# Patient Record
Sex: Female | Born: 1935 | Race: Black or African American | Hispanic: No | Smoking: Never smoker
Health system: Southern US, Community
[De-identification: ages and names within clinical notes are randomized; demographics above are authoritative.]

## PROBLEM LIST (undated history)

## (undated) DIAGNOSIS — I82409 Acute embolism and thrombosis of unspecified deep veins of unspecified lower extremity: Secondary | ICD-10-CM

## (undated) DIAGNOSIS — N289 Disorder of kidney and ureter, unspecified: Secondary | ICD-10-CM

## (undated) DIAGNOSIS — J189 Pneumonia, unspecified organism: Secondary | ICD-10-CM

## (undated) DIAGNOSIS — E785 Hyperlipidemia, unspecified: Secondary | ICD-10-CM

## (undated) DIAGNOSIS — I10 Essential (primary) hypertension: Secondary | ICD-10-CM

## (undated) DIAGNOSIS — M1990 Unspecified osteoarthritis, unspecified site: Secondary | ICD-10-CM

## (undated) DIAGNOSIS — S065X9A Traumatic subdural hemorrhage with loss of consciousness of unspecified duration, initial encounter: Secondary | ICD-10-CM

## (undated) DIAGNOSIS — R32 Unspecified urinary incontinence: Secondary | ICD-10-CM

## (undated) DIAGNOSIS — S065XAA Traumatic subdural hemorrhage with loss of consciousness status unknown, initial encounter: Secondary | ICD-10-CM

## (undated) HISTORY — PX: INSERTION OF VENA CAVA FILTER: SHX5871

## (undated) HISTORY — PX: BRAIN SURGERY: SHX531

---

## 1997-11-26 ENCOUNTER — Other Ambulatory Visit: Admission: RE | Admit: 1997-11-26 | Discharge: 1997-11-26 | Payer: Self-pay | Admitting: Family Medicine

## 1998-03-19 ENCOUNTER — Ambulatory Visit (HOSPITAL_COMMUNITY): Admission: RE | Admit: 1998-03-19 | Discharge: 1998-03-19 | Payer: Self-pay | Admitting: *Deleted

## 1998-04-22 ENCOUNTER — Other Ambulatory Visit: Admission: RE | Admit: 1998-04-22 | Discharge: 1998-04-22 | Payer: Self-pay | Admitting: Obstetrics and Gynecology

## 1999-03-10 ENCOUNTER — Ambulatory Visit (HOSPITAL_COMMUNITY): Admission: RE | Admit: 1999-03-10 | Discharge: 1999-03-10 | Payer: Self-pay | Admitting: *Deleted

## 1999-05-11 ENCOUNTER — Emergency Department (HOSPITAL_COMMUNITY): Admission: EM | Admit: 1999-05-11 | Discharge: 1999-05-11 | Payer: Self-pay | Admitting: Emergency Medicine

## 1999-11-16 ENCOUNTER — Ambulatory Visit (HOSPITAL_COMMUNITY): Admission: RE | Admit: 1999-11-16 | Discharge: 1999-11-16 | Payer: Self-pay | Admitting: *Deleted

## 2001-08-08 ENCOUNTER — Emergency Department (HOSPITAL_COMMUNITY): Admission: EM | Admit: 2001-08-08 | Discharge: 2001-08-08 | Payer: Self-pay | Admitting: Emergency Medicine

## 2004-10-30 ENCOUNTER — Emergency Department (HOSPITAL_COMMUNITY): Admission: EM | Admit: 2004-10-30 | Discharge: 2004-10-30 | Payer: Self-pay | Admitting: Emergency Medicine

## 2011-12-28 ENCOUNTER — Emergency Department (HOSPITAL_COMMUNITY): Payer: Medicare Other

## 2011-12-28 ENCOUNTER — Emergency Department (HOSPITAL_COMMUNITY)
Admission: EM | Admit: 2011-12-28 | Discharge: 2011-12-28 | Disposition: A | Discharge status: Home / Self Care | Payer: Medicare Other | Attending: Emergency Medicine | Admitting: Emergency Medicine

## 2011-12-28 ENCOUNTER — Encounter (HOSPITAL_COMMUNITY): Payer: Self-pay | Admitting: *Deleted

## 2011-12-28 DIAGNOSIS — I10 Essential (primary) hypertension: Secondary | ICD-10-CM | POA: Insufficient documentation

## 2011-12-28 DIAGNOSIS — M25569 Pain in unspecified knee: Secondary | ICD-10-CM | POA: Insufficient documentation

## 2011-12-28 DIAGNOSIS — M1990 Unspecified osteoarthritis, unspecified site: Secondary | ICD-10-CM

## 2011-12-28 DIAGNOSIS — E119 Type 2 diabetes mellitus without complications: Secondary | ICD-10-CM | POA: Insufficient documentation

## 2011-12-28 HISTORY — DX: Essential (primary) hypertension: I10

## 2011-12-28 MED ORDER — OXYCODONE-ACETAMINOPHEN 5-325 MG PO TABS: 2.0000 | ORAL_TABLET | ORAL | Status: AC | PRN

## 2011-12-28 NOTE — ED Provider Notes (Signed)
History   This chart was scribed for Cheri Guppy, MD by Charolett Bumpers . The patient was seen in room TR04C/TR04C. Patient's care was started at 1103.    CSN: [id number]  Arrival date & time 12/28/11  1003   First MD Initiated Contact with Patient 12/28/11 1103      Chief Complaint  Patient presents with  . Leg Pain    (Consider location/radiation/quality/duration/timing/severity/associated sxs/prior treatment) HPI April [name] is a 78 y.o. female who presents to the Emergency Department complaining of constant, moderate right knee pain with associated swelling for the past 2 days. Pt reports that the pain is aggravated with ambulation. Pt denies any recent falls or known injuries. Pt denies any h/o injuries to right knee. Pt denies any fevers or chills. Pt reports a h/o HTN and Diabetes. Pt denies any h/o kidney problems or CHF.   Past Medical History  Diagnosis Date  . Diabetes mellitus   . Hypertension     History reviewed. No pertinent past surgical history.  History reviewed. No pertinent family history.  History  Substance Use Topics  . Smoking status: Never Smoker   . Smokeless tobacco: Not on file  . Alcohol Use: No    OB History    Grav Para Term Preterm Abortions TAB SAB Ect Mult Living                  Review of Systems  Constitutional: Negative for fever and chills.  Respiratory: Negative for shortness of breath.   Gastrointestinal: Negative for nausea and vomiting.  Musculoskeletal: Positive for joint swelling and arthralgias.  Neurological: Negative for weakness.    Allergies  Review of patient's allergies indicates no known allergies.  Home Medications   Current Outpatient Rx  Name Route Sig Dispense Refill  . ASPIRIN EC 81 MG PO TBEC Oral Take 81 mg by mouth daily.    . ATORVASTATIN CALCIUM 40 MG PO TABS Oral Take 40 mg by mouth daily. On Mondays    . CARVEDILOL 25 MG PO TABS Oral Take 25 mg by mouth 2 (two) times daily with a  meal.    . CLONIDINE HCL 0.2 MG/24HR TD PTWK Transdermal Place 1 patch onto the skin once a week.    Marland Kitchen HYDROCHLOROTHIAZIDE 25 MG PO TABS Oral Take 50 mg by mouth daily.    Marland Kitchen SITAGLIPTIN PHOSPHATE 50 MG PO TABS Oral Take 50 mg by mouth daily.    . TELMISARTAN 80 MG PO TABS Oral Take 80 mg by mouth daily.      BP 144/58  Pulse 72  Temp 98.5 F (36.9 C) (Oral)  Resp 16  SpO2 97%  Physical Exam  Nursing note and vitals reviewed. Constitutional: She is oriented to person, place, and time. She appears well-developed and well-nourished. No distress.  HENT:  Head: Normocephalic and atraumatic.  Eyes: EOM are normal.  Neck: Normal range of motion. Neck supple. No tracheal deviation present.  Cardiovascular: Normal rate.   Pulmonary/Chest: Effort normal. No respiratory distress.  Musculoskeletal: Normal range of motion. She exhibits tenderness. She exhibits no edema.       No swelling to right knee. Full ROM of right knee. Minimal tenderness to medial aspect of right knee.   Neurological: She is alert and oriented to person, place, and time.  Skin: Skin is warm and dry.  Psychiatric: She has a normal mood and affect. Her behavior is normal.    ED Course  Procedures (including critical care time)  DIAGNOSTIC STUDIES: Oxygen Saturation is 97% on room air, normal by my interpretation.    COORDINATION OF CARE:  11:09-Discussed planned course of treatment with the patient including a x-ray of right knee, who is agreeable at this time.     Labs Reviewed - No data to display No results found.   No diagnosis found.    MDM  Nontraumatic right knee pain, in elderly, female.  No systemic symptoms.  No physical examination findings of edema, infection, or joint instability.  X-ray, consistent with osteoarthritis.      I personally performed the services described in this documentation, which was scribed in my presence. The recorded information has been reviewed and  considered.    a  Cheri Guppy, MD 12/28/11 1140

## 2011-12-28 NOTE — ED Notes (Signed)
Pt d/c home in NAD. Pt voiced understanding of d/c instructions and follow up care. Pt instructed not to drive or operate heavy machinery after taking percocet.

## 2011-12-28 NOTE — ED Notes (Signed)
To ED for eval of left leg pain for past week. Min-mod swelling from left knee up to thigh. Denies injury. Pain increases with ambulation. Denies SOB

## 2012-06-13 ENCOUNTER — Emergency Department (INDEPENDENT_AMBULATORY_CARE_PROVIDER_SITE_OTHER)
Admission: EM | Admit: 2012-06-13 | Discharge: 2012-06-13 | Disposition: A | Discharge status: Home / Self Care | Payer: Medicare Other | Source: Home / Self Care | Attending: Family Medicine | Admitting: Family Medicine

## 2012-06-13 ENCOUNTER — Encounter (HOSPITAL_COMMUNITY): Payer: Self-pay | Admitting: Emergency Medicine

## 2012-06-13 DIAGNOSIS — E119 Type 2 diabetes mellitus without complications: Secondary | ICD-10-CM

## 2012-06-13 DIAGNOSIS — I10 Essential (primary) hypertension: Secondary | ICD-10-CM

## 2012-06-13 LAB — POCT URINALYSIS DIP (DEVICE)
Bilirubin Urine: NEGATIVE
Glucose, UA: 500 mg/dL — AB
Leukocytes, UA: NEGATIVE
Nitrite: NEGATIVE
Urobilinogen, UA: 0.2 mg/dL (ref 0.0–1.0)
pH: 6 (ref 5.0–8.0)

## 2012-06-13 MED ORDER — SITAGLIPTIN PHOSPHATE 50 MG PO TABS: 50.0000 mg | ORAL_TABLET | Freq: Every day | ORAL | Status: DC

## 2012-06-13 MED ORDER — AMLODIPINE BESYLATE 10 MG PO TABS: 10.0000 mg | ORAL_TABLET | Freq: Every day | ORAL | Status: DC

## 2012-06-13 MED ORDER — HYDROCHLOROTHIAZIDE 25 MG PO TABS: 25.0000 mg | ORAL_TABLET | Freq: Every day | ORAL | Status: DC

## 2012-06-13 NOTE — ED Notes (Signed)
Pt c/o abdominal/stomach pain and having indigestion/acid reflux. Pt is out of three of her medications. Amlotipine,hctz,januvia.   Pt's son states that her blood sugar has been in the three hundreds for the past several days.

## 2012-06-13 NOTE — ED Notes (Signed)
Waiting discharge papers 

## 2012-06-13 NOTE — ED Provider Notes (Signed)
History     CSN: [id number]  Arrival date & time 06/13/12  1525   First MD Initiated Contact with Patient 06/13/12 1637      Chief Complaint  Patient presents with  . Abdominal Pain    pt states abdominal/acid reflux/indigetion. blood sugar high x 3 days. out meds.     (Consider location/radiation/quality/duration/timing/severity/associated sxs/prior treatment) Patient is a 79 y.o. female presenting with diabetes problem. The history is provided by the patient and a relative.  Diabetes She has type 2 diabetes mellitus. Hypoglycemia symptoms include headaches. Pertinent negatives for hypoglycemia include no confusion. Associated symptoms include polydipsia. Symptoms are stable.    Past Medical History  Diagnosis Date  . Diabetes mellitus   . Hypertension     History reviewed. No pertinent past surgical history.  History reviewed. No pertinent family history.  History  Substance Use Topics  . Smoking status: Never Smoker   . Smokeless tobacco: Not on file  . Alcohol Use: No    OB History    Grav Para Term Preterm Abortions TAB SAB Ect Mult Living                  Review of Systems  Constitutional: Negative.   Gastrointestinal: Positive for nausea. Negative for abdominal pain.  Genitourinary: Negative.   Skin: Negative.   Neurological: Positive for headaches.  Hematological: Positive for polydipsia.  Psychiatric/Behavioral: Negative for confusion.    Allergies  Review of patient's allergies indicates no known allergies.  Home Medications   Current Outpatient Rx  Name  Route  Sig  Dispense  Refill  . CARVEDILOL 25 MG PO TABS   Oral   Take 25 mg by mouth 2 (two) times daily with a meal.         . TELMISARTAN 80 MG PO TABS   Oral   Take 80 mg by mouth daily.         Marland Kitchen AMLODIPINE BESYLATE 10 MG PO TABS   Oral   Take 1 tablet (10 mg total) by mouth daily.   30 tablet   1   . AMLODIPINE BESYLATE 10 MG PO TABS   Oral   Take 10 mg by mouth daily.         . ASPIRIN EC 81 MG PO TBEC   Oral   Take 81 mg by mouth daily.         . ATORVASTATIN CALCIUM 40 MG PO TABS   Oral   Take 40 mg by mouth daily. On Mondays         . CLONIDINE HCL 0.2 MG/24HR TD PTWK   Transdermal   Place 1 patch onto the skin once a week.         Marland Kitchen HYDROCHLOROTHIAZIDE 25 MG PO TABS   Oral   Take 50 mg by mouth daily.         Marland Kitchen HYDROCHLOROTHIAZIDE 25 MG PO TABS   Oral   Take 1 tablet (25 mg total) by mouth daily.   30 tablet   1   . SITAGLIPTIN PHOSPHATE 50 MG PO TABS   Oral   Take 50 mg by mouth daily.         Marland Kitchen SITAGLIPTIN PHOSPHATE 50 MG PO TABS   Oral   Take 1 tablet (50 mg total) by mouth daily.   30 tablet   1     BP 117/86  Pulse 80  Temp 98.3 F (36.8 C) (Oral)  Resp 18  SpO2 96%  Physical Exam  Nursing note and vitals reviewed. Constitutional: She is oriented to person, place, and time. She appears well-developed and well-nourished.  HENT:  Head: Normocephalic.  Mouth/Throat: Oropharynx is clear and moist.  Eyes: Pupils are equal, round, and reactive to light.  Neck: Normal range of motion. Neck supple.  Cardiovascular: Normal rate and normal heart sounds.   Pulmonary/Chest: Breath sounds normal.  Abdominal: Soft. Bowel sounds are normal. She exhibits no distension. There is no tenderness.  Lymphadenopathy:    She has no cervical adenopathy.  Neurological: She is alert and oriented to person, place, and time.  Skin: Skin is warm and dry.    ED Course  Procedures (including critical care time)  Labs Reviewed  POCT URINALYSIS DIP (DEVICE) - Abnormal; Notable for the following:    Glucose, UA 500 (*)     Ketones, ur TRACE (*)     Hgb urine dipstick MODERATE (*)     Protein, ur >=300 (*)     All other components within normal limits   No results found.   1. Hypertension   2. Diabetes mellitus       MDM          Linna Hoff, MD 06/13/12 1745

## 2012-06-19 ENCOUNTER — Encounter (HOSPITAL_COMMUNITY): Payer: Self-pay

## 2012-06-19 ENCOUNTER — Emergency Department (INDEPENDENT_AMBULATORY_CARE_PROVIDER_SITE_OTHER)
Admission: EM | Admit: 2012-06-19 | Discharge: 2012-06-19 | Disposition: A | Discharge status: Home / Self Care | Payer: Medicare Other | Source: Home / Self Care

## 2012-06-19 DIAGNOSIS — E119 Type 2 diabetes mellitus without complications: Secondary | ICD-10-CM

## 2012-06-19 DIAGNOSIS — E785 Hyperlipidemia, unspecified: Secondary | ICD-10-CM

## 2012-06-19 DIAGNOSIS — I10 Essential (primary) hypertension: Secondary | ICD-10-CM

## 2012-06-19 LAB — COMPREHENSIVE METABOLIC PANEL
ALT: 11 U/L (ref 0–35)
AST: 16 U/L (ref 0–37)
Albumin: 2.7 g/dL — ABNORMAL LOW (ref 3.5–5.2)
Alkaline Phosphatase: 117 U/L (ref 39–117)
Calcium: 9.1 mg/dL (ref 8.4–10.5)
Glucose, Bld: 574 mg/dL (ref 70–99)
Potassium: 3.3 mEq/L — ABNORMAL LOW (ref 3.5–5.1)
Sodium: 136 mEq/L (ref 135–145)
Total Protein: 6.7 g/dL (ref 6.0–8.3)

## 2012-06-19 LAB — CBC
Hemoglobin: 11 g/dL — ABNORMAL LOW (ref 12.0–15.0)
MCH: 26.3 pg (ref 26.0–34.0)
MCHC: 32.8 g/dL (ref 30.0–36.0)
Platelets: 230 10*3/uL (ref 150–400)
RBC: 4.19 MIL/uL (ref 3.87–5.11)

## 2012-06-19 LAB — LIPID PANEL
HDL: 45 mg/dL (ref >39)
LDL Cholesterol: 108 mg/dL — ABNORMAL HIGH (ref 0–99)
Total CHOL/HDL Ratio: 4.4 RATIO
VLDL: 45 mg/dL — ABNORMAL HIGH (ref 0–40)

## 2012-06-19 LAB — HEMOGLOBIN A1C
Hgb A1c MFr Bld: 17 % — ABNORMAL HIGH (ref <5.7)
Mean Plasma Glucose: 441 mg/dL — ABNORMAL HIGH (ref <117)

## 2012-06-19 MED ORDER — INSULIN PEN NEEDLE 31G X 8 MM MISC: Status: DC

## 2012-06-19 MED ORDER — ATORVASTATIN CALCIUM 40 MG PO TABS: 40.0000 mg | ORAL_TABLET | Freq: Every day | ORAL | Status: DC

## 2012-06-19 MED ORDER — INSULIN GLARGINE 100 UNIT/ML ~~LOC~~ SOLN: SUBCUTANEOUS | Status: DC

## 2012-06-19 MED ORDER — INSULIN ASPART 100 UNIT/ML ~~LOC~~ SOLN
10.0000 [IU] | Freq: Once | SUBCUTANEOUS | Status: AC
  Administered 2012-06-19: 10 [IU] via SUBCUTANEOUS

## 2012-06-19 MED ORDER — TELMISARTAN 80 MG PO TABS: 80.0000 mg | ORAL_TABLET | Freq: Every day | ORAL | Status: DC

## 2012-06-19 MED ORDER — CLONIDINE HCL 0.2 MG/24HR TD PTWK: 1.0000 | MEDICATED_PATCH | TRANSDERMAL | Status: DC

## 2012-06-19 MED ORDER — CARVEDILOL 25 MG PO TABS: 25.0000 mg | ORAL_TABLET | Freq: Two times a day (BID) | ORAL | Status: DC

## 2012-06-19 NOTE — ED Notes (Signed)
Patient has history of hypertension and DM- needs medication refill

## 2012-06-19 NOTE — ED Provider Notes (Signed)
History    CSN: [id number]  Arrival date & time 06/19/12  1126  Chief Complaint  Patient presents with  . Medication Refill   HPI Pt reports that she is still looking for a primary care physician.  Pt needs some refills on some of her medications.  Pt is reporting that she has relocated from [place].   No complaints today.  No old records available.  BS are 300s at home per son.  Pt is taking all meds as prescribed.  The patient's son has been very concerned because of her high blood sugars.  Apparently she had been taking 7030 insulin twice daily for quite some time but apparently the insulin was stopped by a family member before the patient relocated to this area.  For the last 2 weeks he has noticed high blood sugar readings.  The patient eats well.  Apparently, the patient was having hypoglycemia events with 7030 insulin.  The patient says she was taking between 15-30 units twice daily of 70/30 insulin.  The patient's son and primary caretaker reports that he is checking her blood sugars at least 2 times per day.  She's had no hypoglycemia but she has had significant hyperglycemia.  She also has had urinary accidents.   Past Medical History  Diagnosis Date  . Diabetes mellitus   . Hypertension     History reviewed. No pertinent past surgical history.  No family history on file.  History  Substance Use Topics  . Smoking status: Never Smoker   . Smokeless tobacco: Not on file  . Alcohol Use: No    OB History    Grav Para Term Preterm Abortions TAB SAB Ect Mult Living                 Review of Systems  Constitutional: Negative.   HENT: Negative.   Cardiovascular: Negative.   Gastrointestinal:       Constipation  Genitourinary: Positive for urgency and enuresis.  All other systems reviewed and are negative.   Allergies  Review of patient's allergies indicates no known allergies.  Home Medications   Current Outpatient Rx  Name  Route  Sig  Dispense  Refill  .  AMLODIPINE BESYLATE 10 MG PO TABS   Oral   Take 1 tablet (10 mg total) by mouth daily.   30 tablet   1   . AMLODIPINE BESYLATE 10 MG PO TABS   Oral   Take 10 mg by mouth daily.         . ASPIRIN EC 81 MG PO TBEC   Oral   Take 81 mg by mouth daily.         . ATORVASTATIN CALCIUM 40 MG PO TABS   Oral   Take 40 mg by mouth daily. On Mondays         . CARVEDILOL 25 MG PO TABS   Oral   Take 25 mg by mouth 2 (two) times daily with a meal.         . CLONIDINE HCL 0.2 MG/24HR TD PTWK   Transdermal   Place 1 patch onto the skin once a week.         Marland Kitchen HYDROCHLOROTHIAZIDE 25 MG PO TABS   Oral   Take 50 mg by mouth daily.         Marland Kitchen HYDROCHLOROTHIAZIDE 25 MG PO TABS   Oral   Take 1 tablet (25 mg total) by mouth daily.   30 tablet   1   .  SITAGLIPTIN PHOSPHATE 50 MG PO TABS   Oral   Take 50 mg by mouth daily.         Marland Kitchen SITAGLIPTIN PHOSPHATE 50 MG PO TABS   Oral   Take 1 tablet (50 mg total) by mouth daily.   30 tablet   1   . TELMISARTAN 80 MG PO TABS   Oral   Take 80 mg by mouth daily.           BP 155/74  Pulse 75  Temp 97.4 F (36.3 C) (Oral)  Resp 19  SpO2 99%  Physical Exam  Nursing note and vitals reviewed. Constitutional: She is oriented to person, place, and time. She appears well-developed and well-nourished. No distress.  HENT:  Head: Normocephalic and atraumatic.  Eyes: EOM are normal. Pupils are equal, round, and reactive to light.  Neck: Normal range of motion. Neck supple.  Cardiovascular: Normal rate, regular rhythm and normal heart sounds.   Pulmonary/Chest: Effort normal and breath sounds normal.  Abdominal: Soft. Bowel sounds are normal. She exhibits no distension and no mass. There is no tenderness. There is no rebound and no guarding.       Feel stool in abdomen   Musculoskeletal: Normal range of motion. She exhibits no edema and no tenderness.  Neurological: She is alert and oriented to person, place, and time. She has  normal reflexes. No cranial nerve deficit.  Skin: Skin is warm and dry. No rash noted. No erythema. No pallor.  Psychiatric: She has a normal mood and affect. Her behavior is normal. Judgment and thought content normal.    ED Course  Procedures (including critical care time)  Labs Reviewed - No data to display No results found.  No diagnosis found.  BS - 535  MDM  IMPRESSION  HTN, difficult to control  Type 2 Diabetes Mellitus, difficult to control, currently uncontrolled, insulin requiring  Constipation  Suspected CRF  NO MEDICAL RECORDS AVAILABLE  Hyperglycemia and glucose toxicity  RECOMMENDATIONS / PLAN The patient was given 10 units of NovoLog in the office today. Starting Lantus Solostar Pen taking 15 units QHS with instructions to call  In 1 week with BS readings and will  Titrate to safely improve BS readings.  Continue sitagliptin daily.  Suspect renal insufficiency.  Checking labs today.  Will follow results.   The patient is working to establish primary care with a private physician.  Until she gets established with that physician she is welcome to continue to come to this clinic.  I would like to see her back in 2 weeks for further evaluation. The patient's son and primary caretaker reports that he is very familiar with the use of insulin pins and pen needles.  He has used them in the past.  I gave him instructions on how to begin tonight with the first dose of Lantus insulin.  He will begin with 15 units as mentioned above. Further instructions on diabetes care PENDING RESULTS OF LAB TESTS. Recommended over-the-counter stool softener I did refill the patient's other chronic home medications  FOLLOW UP 2 weeks for recheck.    The patient was given clear instructions to go to ER or return to medical center if symptoms don't improve, worsen or new problems develop.  The patient verbalized understanding.  The patient was told to call to get lab results if they  haven't heard anything in the next week.           Cleora Fleet, MD 06/19/12  1415 

## 2012-06-20 ENCOUNTER — Inpatient Hospital Stay (HOSPITAL_COMMUNITY): Payer: Medicare Other

## 2012-06-20 ENCOUNTER — Encounter (HOSPITAL_COMMUNITY): Payer: Self-pay | Admitting: *Deleted

## 2012-06-20 ENCOUNTER — Emergency Department (HOSPITAL_COMMUNITY): Payer: Medicare Other

## 2012-06-20 ENCOUNTER — Inpatient Hospital Stay (HOSPITAL_COMMUNITY)
Admission: EM | Admit: 2012-06-20 | Discharge: 2012-06-24 | DRG: 193 | Disposition: A | Discharge status: Home / Self Care | Payer: Medicare Other | Attending: Internal Medicine | Admitting: Internal Medicine

## 2012-06-20 DIAGNOSIS — R410 Disorientation, unspecified: Secondary | ICD-10-CM

## 2012-06-20 DIAGNOSIS — Z7982 Long term (current) use of aspirin: Secondary | ICD-10-CM

## 2012-06-20 DIAGNOSIS — I6200 Nontraumatic subdural hemorrhage, unspecified: Secondary | ICD-10-CM | POA: Diagnosis present

## 2012-06-20 DIAGNOSIS — Z23 Encounter for immunization: Secondary | ICD-10-CM

## 2012-06-20 DIAGNOSIS — G929 Unspecified toxic encephalopathy: Secondary | ICD-10-CM | POA: Diagnosis present

## 2012-06-20 DIAGNOSIS — Z794 Long term (current) use of insulin: Secondary | ICD-10-CM

## 2012-06-20 DIAGNOSIS — Z91199 Patient's noncompliance with other medical treatment and regimen due to unspecified reason: Secondary | ICD-10-CM

## 2012-06-20 DIAGNOSIS — Z9119 Patient's noncompliance with other medical treatment and regimen: Secondary | ICD-10-CM

## 2012-06-20 DIAGNOSIS — I10 Essential (primary) hypertension: Secondary | ICD-10-CM

## 2012-06-20 DIAGNOSIS — IMO0001 Reserved for inherently not codable concepts without codable children: Secondary | ICD-10-CM | POA: Diagnosis present

## 2012-06-20 DIAGNOSIS — E860 Dehydration: Secondary | ICD-10-CM | POA: Diagnosis present

## 2012-06-20 DIAGNOSIS — N189 Chronic kidney disease, unspecified: Secondary | ICD-10-CM | POA: Diagnosis present

## 2012-06-20 DIAGNOSIS — G92 Toxic encephalopathy: Secondary | ICD-10-CM | POA: Diagnosis present

## 2012-06-20 DIAGNOSIS — D638 Anemia in other chronic diseases classified elsewhere: Secondary | ICD-10-CM | POA: Diagnosis present

## 2012-06-20 DIAGNOSIS — S065X9A Traumatic subdural hemorrhage with loss of consciousness of unspecified duration, initial encounter: Secondary | ICD-10-CM

## 2012-06-20 DIAGNOSIS — I129 Hypertensive chronic kidney disease with stage 1 through stage 4 chronic kidney disease, or unspecified chronic kidney disease: Secondary | ICD-10-CM | POA: Diagnosis present

## 2012-06-20 DIAGNOSIS — G9340 Encephalopathy, unspecified: Secondary | ICD-10-CM

## 2012-06-20 DIAGNOSIS — R197 Diarrhea, unspecified: Secondary | ICD-10-CM | POA: Diagnosis present

## 2012-06-20 DIAGNOSIS — R509 Fever, unspecified: Secondary | ICD-10-CM

## 2012-06-20 DIAGNOSIS — J189 Pneumonia, unspecified organism: Secondary | ICD-10-CM

## 2012-06-20 DIAGNOSIS — E876 Hypokalemia: Secondary | ICD-10-CM | POA: Diagnosis present

## 2012-06-20 DIAGNOSIS — E119 Type 2 diabetes mellitus without complications: Secondary | ICD-10-CM | POA: Diagnosis present

## 2012-06-20 DIAGNOSIS — N19 Unspecified kidney failure: Secondary | ICD-10-CM

## 2012-06-20 DIAGNOSIS — E785 Hyperlipidemia, unspecified: Secondary | ICD-10-CM | POA: Diagnosis present

## 2012-06-20 DIAGNOSIS — N179 Acute kidney failure, unspecified: Secondary | ICD-10-CM | POA: Diagnosis present

## 2012-06-20 HISTORY — DX: Hyperlipidemia, unspecified: E78.5

## 2012-06-20 LAB — COMPREHENSIVE METABOLIC PANEL
ALT: 10 U/L (ref 0–35)
AST: 17 U/L (ref 0–37)
Alkaline Phosphatase: 100 U/L (ref 39–117)
GFR calc Af Amer: 29 mL/min — ABNORMAL LOW (ref >90)
Glucose, Bld: 312 mg/dL — ABNORMAL HIGH (ref 70–99)
Potassium: 2.8 mEq/L — ABNORMAL LOW (ref 3.5–5.1)
Total Protein: 6.5 g/dL (ref 6.0–8.3)

## 2012-06-20 LAB — CBC
Hemoglobin: 11.3 g/dL — ABNORMAL LOW (ref 12.0–15.0)
MCH: 26.9 pg (ref 26.0–34.0)
MCV: 78.6 fL (ref 78.0–100.0)
RBC: 4.2 MIL/uL (ref 3.87–5.11)

## 2012-06-20 LAB — CBC WITH DIFFERENTIAL/PLATELET
Basophils Relative: 1 % (ref 0–1)
Eosinophils Relative: 1 % (ref 0–5)
HCT: 32 % — ABNORMAL LOW (ref 36.0–46.0)
Hemoglobin: 10.8 g/dL — ABNORMAL LOW (ref 12.0–15.0)
MCHC: 33.8 g/dL (ref 30.0–36.0)
MCV: 78.6 fL (ref 78.0–100.0)
Monocytes Absolute: 0.5 10*3/uL (ref 0.1–1.0)
Monocytes Relative: 6 % (ref 3–12)
Neutro Abs: 6 10*3/uL (ref 1.7–7.7)

## 2012-06-20 LAB — URINALYSIS, ROUTINE W REFLEX MICROSCOPIC
Bilirubin Urine: NEGATIVE
Glucose, UA: >1000 mg/dL — AB
Ketones, ur: NEGATIVE mg/dL
Leukocytes, UA: NEGATIVE
Protein, ur: >300 mg/dL — AB
pH: 7 (ref 5.0–8.0)

## 2012-06-20 LAB — URINE MICROSCOPIC-ADD ON

## 2012-06-20 LAB — GLUCOSE, CAPILLARY: Glucose-Capillary: 208 mg/dL — ABNORMAL HIGH (ref 70–99)

## 2012-06-20 MED ORDER — ONDANSETRON HCL 4 MG PO TABS: 4.0000 mg | ORAL_TABLET | Freq: Four times a day (QID) | ORAL | Status: DC | PRN

## 2012-06-20 MED ORDER — ACETAMINOPHEN 650 MG RE SUPP: 650.0000 mg | Freq: Four times a day (QID) | RECTAL | Status: DC | PRN

## 2012-06-20 MED ORDER — HEPARIN SODIUM (PORCINE) 5000 UNIT/ML IJ SOLN
5000.0000 [IU] | Freq: Three times a day (TID) | INTRAMUSCULAR | Status: DC
  Filled 2012-06-20: qty 1

## 2012-06-20 MED ORDER — DEXTROSE 5 % IV SOLN
1.0000 g | INTRAVENOUS | Status: DC
  Filled 2012-06-20: qty 10

## 2012-06-20 MED ORDER — OSELTAMIVIR PHOSPHATE 75 MG PO CAPS
75.0000 mg | ORAL_CAPSULE | Freq: Every day | ORAL | Status: DC
  Administered 2012-06-21 – 2012-06-24 (×4): 75 mg via ORAL
  Filled 2012-06-20 (×4): qty 1

## 2012-06-20 MED ORDER — SODIUM CHLORIDE 0.9 % IV BOLUS (SEPSIS)
500.0000 mL | Freq: Once | INTRAVENOUS | Status: AC
  Administered 2012-06-20: 500 mL via INTRAVENOUS

## 2012-06-20 MED ORDER — CLONIDINE HCL 0.2 MG/24HR TD PTWK: 0.2000 mg | MEDICATED_PATCH | TRANSDERMAL | Status: DC

## 2012-06-20 MED ORDER — ONDANSETRON HCL 4 MG/2ML IJ SOLN: 4.0000 mg | Freq: Four times a day (QID) | INTRAMUSCULAR | Status: DC | PRN

## 2012-06-20 MED ORDER — POTASSIUM CHLORIDE 10 MEQ/100ML IV SOLN
10.0000 meq | Freq: Once | INTRAVENOUS | Status: AC
  Administered 2012-06-20: 10 meq via INTRAVENOUS
  Filled 2012-06-20: qty 100

## 2012-06-20 MED ORDER — OSELTAMIVIR PHOSPHATE 75 MG PO CAPS
75.0000 mg | ORAL_CAPSULE | Freq: Once | ORAL | Status: AC
  Administered 2012-06-20: 75 mg via ORAL
  Filled 2012-06-20: qty 1

## 2012-06-20 MED ORDER — AZITHROMYCIN 250 MG PO TABS
500.0000 mg | ORAL_TABLET | Freq: Once | ORAL | Status: AC
  Administered 2012-06-20: 500 mg via ORAL
  Filled 2012-06-20: qty 2

## 2012-06-20 MED ORDER — AZITHROMYCIN 500 MG IV SOLR
500.0000 mg | INTRAVENOUS | Status: DC
  Filled 2012-06-20: qty 500

## 2012-06-20 MED ORDER — POTASSIUM CHLORIDE CRYS ER 20 MEQ PO TBCR
40.0000 meq | EXTENDED_RELEASE_TABLET | Freq: Once | ORAL | Status: AC
  Administered 2012-06-20: 40 meq via ORAL
  Filled 2012-06-20: qty 2

## 2012-06-20 MED ORDER — IRBESARTAN 300 MG PO TABS
300.0000 mg | ORAL_TABLET | Freq: Every day | ORAL | Status: DC
  Filled 2012-06-20: qty 1

## 2012-06-20 MED ORDER — LINAGLIPTIN 5 MG PO TABS
5.0000 mg | ORAL_TABLET | Freq: Every day | ORAL | Status: DC
  Administered 2012-06-21 – 2012-06-24 (×4): 5 mg via ORAL
  Filled 2012-06-20 (×4): qty 1

## 2012-06-20 MED ORDER — CARVEDILOL 25 MG PO TABS
25.0000 mg | ORAL_TABLET | Freq: Two times a day (BID) | ORAL | Status: DC
  Administered 2012-06-21 – 2012-06-24 (×7): 25 mg via ORAL
  Filled 2012-06-20 (×10): qty 1

## 2012-06-20 MED ORDER — PNEUMOCOCCAL VAC POLYVALENT 25 MCG/0.5ML IJ INJ
0.5000 mL | INJECTION | INTRAMUSCULAR | Status: AC
  Administered 2012-06-21: 0.5 mL via INTRAMUSCULAR
  Filled 2012-06-20: qty 0.5

## 2012-06-20 MED ORDER — HYDRALAZINE HCL 20 MG/ML IJ SOLN
10.0000 mg | INTRAMUSCULAR | Status: DC | PRN
  Administered 2012-06-21 – 2012-06-23 (×2): 10 mg via INTRAVENOUS
  Filled 2012-06-20 (×2): qty 0.5

## 2012-06-20 MED ORDER — SODIUM CHLORIDE 0.9 % IJ SOLN
3.0000 mL | Freq: Two times a day (BID) | INTRAMUSCULAR | Status: DC
  Administered 2012-06-22 – 2012-06-23 (×3): 3 mL via INTRAVENOUS

## 2012-06-20 MED ORDER — SODIUM CHLORIDE 0.9 % IV SOLN
INTRAVENOUS | Status: AC
  Administered 2012-06-20 – 2012-06-21 (×2): via INTRAVENOUS

## 2012-06-20 MED ORDER — ATORVASTATIN CALCIUM 40 MG PO TABS
40.0000 mg | ORAL_TABLET | Freq: Every evening | ORAL | Status: DC
  Administered 2012-06-21 – 2012-06-23 (×3): 40 mg via ORAL
  Filled 2012-06-20 (×4): qty 1

## 2012-06-20 MED ORDER — INSULIN GLARGINE 100 UNIT/ML ~~LOC~~ SOLN
5.0000 [IU] | Freq: Once | SUBCUTANEOUS | Status: AC
  Administered 2012-06-20: 5 [IU] via SUBCUTANEOUS
  Filled 2012-06-20: qty 1

## 2012-06-20 MED ORDER — OSELTAMIVIR PHOSPHATE 75 MG PO CAPS: 75.0000 mg | ORAL_CAPSULE | Freq: Two times a day (BID) | ORAL | Status: DC

## 2012-06-20 MED ORDER — INSULIN GLARGINE 100 UNIT/ML ~~LOC~~ SOLN
10.0000 [IU] | Freq: Every day | SUBCUTANEOUS | Status: DC
  Administered 2012-06-21 – 2012-06-23 (×3): 10 [IU] via SUBCUTANEOUS

## 2012-06-20 MED ORDER — DEXTROSE 5 % IV SOLN
1.0000 g | Freq: Once | INTRAVENOUS | Status: AC
  Administered 2012-06-20: 1 g via INTRAVENOUS
  Filled 2012-06-20: qty 10

## 2012-06-20 MED ORDER — AMLODIPINE BESYLATE 10 MG PO TABS
10.0000 mg | ORAL_TABLET | Freq: Every day | ORAL | Status: DC
  Administered 2012-06-21 – 2012-06-24 (×4): 10 mg via ORAL
  Filled 2012-06-20 (×4): qty 1

## 2012-06-20 MED ORDER — ACETAMINOPHEN 325 MG PO TABS
650.0000 mg | ORAL_TABLET | Freq: Four times a day (QID) | ORAL | Status: DC | PRN
  Administered 2012-06-20: 650 mg via ORAL
  Filled 2012-06-20: qty 2

## 2012-06-20 MED ORDER — INSULIN ASPART 100 UNIT/ML ~~LOC~~ SOLN
0.0000 [IU] | Freq: Three times a day (TID) | SUBCUTANEOUS | Status: DC
  Administered 2012-06-21: 5 [IU] via SUBCUTANEOUS
  Administered 2012-06-21 (×2): 3 [IU] via SUBCUTANEOUS
  Administered 2012-06-22 (×2): 5 [IU] via SUBCUTANEOUS
  Administered 2012-06-23: 2 [IU] via SUBCUTANEOUS
  Administered 2012-06-23: 5 [IU] via SUBCUTANEOUS
  Administered 2012-06-23 – 2012-06-24 (×2): 3 [IU] via SUBCUTANEOUS
  Administered 2012-06-24: 2 [IU] via SUBCUTANEOUS

## 2012-06-20 NOTE — ED Notes (Signed)
Family at bedside. 

## 2012-06-20 NOTE — Progress Notes (Signed)
Received report from ED. Patient going for CT of head. Clarified with Dr. Toniann Fail to determine if patient was r/o CVA. Dr. Toniann Fail stated just rountine CT of head, not r/o stroke at this point. Will continue to monitor patient. Nelda Marseille, RN

## 2012-06-20 NOTE — ED Notes (Signed)
Per pt family she has been off her insulin for a while and recently started back on Lantus. She has had elevated CBG for a few weeks. Pt has been very weak, lethargic and sleeping all the time. sts she will not eat.

## 2012-06-20 NOTE — Progress Notes (Signed)
Patient arrived from ED with son at bedside. Patient is A&Ox2. Dr. Toniann Fail on floor due to CT of head showing bleed. Patient's skin is warm, dry and intact. Placed on tele.  Oriented son and patient to floor. Placed on bedalarm.  Explained to son and patient to call for assistance before getting up and purpose of bedalarm. Stated understanding. Patient on droplet precautions to r/o flu. Will continue to monitor patient. Nelda Marseille, RN

## 2012-06-20 NOTE — ED Notes (Signed)
Pt was taken off of insulin for a while by her doctor and was placed back on insulin yesterday from the urgent care center.  She is still having high sugar reading 437 am and 407 one hour ago.  Pt has been sleeping a lot and has not been eating.  Pt complains of stomach hurting and question constipation.  Pt had mild laxative and move bowels today.

## 2012-06-20 NOTE — ED Notes (Signed)
Patient transported to X-ray 

## 2012-06-20 NOTE — ED Provider Notes (Signed)
History     CSN: [id number]  Arrival date & time 06/20/12  1533   First MD Initiated Contact with Patient 06/20/12 1719      No chief complaint on file.   (Consider location/radiation/quality/duration/timing/severity/associated sxs/prior treatment) Patient is a 79 y.o. female presenting with altered mental status. The history is provided by the patient. No language interpreter was used.  Altered Mental Status This is a new problem. The current episode started today. The problem occurs constantly. The problem has been unchanged. Associated symptoms include a fever. Pertinent negatives include no abdominal pain, chest pain, chills, congestion, coughing, headaches, nausea, rash, sore throat or vomiting. Nothing aggravates the symptoms. She has tried nothing for the symptoms. The treatment provided no relief.    Past Medical History  Diagnosis Date  . Diabetes mellitus   . Hypertension     History reviewed. No pertinent past surgical history.  No family history on file.  History  Substance Use Topics  . Smoking status: Never Smoker   . Smokeless tobacco: Not on file  . Alcohol Use: No    OB History    Grav Para Term Preterm Abortions TAB SAB Ect Mult Living                  Review of Systems  Constitutional: Positive for fever. Negative for chills.  HENT: Negative for congestion and sore throat.   Respiratory: Negative for cough and shortness of breath.   Cardiovascular: Negative for chest pain and leg swelling.  Gastrointestinal: Negative for nausea, vomiting, abdominal pain, diarrhea and constipation.  Genitourinary: Negative for dysuria and frequency.  Skin: Negative for color change and rash.  Neurological: Negative for dizziness and headaches.  Psychiatric/Behavioral: Positive for confusion and altered mental status. Negative for agitation.  All other systems reviewed and are negative.    Allergies  Review of patient's allergies indicates no known  allergies.  Home Medications   Current Outpatient Rx  Name  Route  Sig  Dispense  Refill  . AMLODIPINE BESYLATE 10 MG PO TABS   Oral   Take 1 tablet (10 mg total) by mouth daily.   30 tablet   1   . ASPIRIN EC 81 MG PO TBEC   Oral   Take 81 mg by mouth daily.         . ATORVASTATIN CALCIUM 40 MG PO TABS   Oral   Take 40 mg by mouth every evening.         Marland Kitchen CARVEDILOL 25 MG PO TABS   Oral   Take 1 tablet (25 mg total) by mouth 2 (two) times daily with a meal.   60 tablet   3   . CLONIDINE HCL 0.2 MG/24HR TD PTWK   Transdermal   Place 1 patch (0.2 mg total) onto the skin once a week.   4 patch   3   . HYDROCHLOROTHIAZIDE 25 MG PO TABS   Oral   Take 1 tablet (25 mg total) by mouth daily.   30 tablet   1   . INSULIN GLARGINE 100 UNIT/ML [place] SOLN   Subcutaneous   Inject 10 Units into the skin daily.         Marland Kitchen SITAGLIPTIN PHOSPHATE 50 MG PO TABS   Oral   Take 1 tablet (50 mg total) by mouth daily.   30 tablet   1   . TELMISARTAN 80 MG PO TABS   Oral   Take 1 tablet (80 mg total) by  mouth daily.   30 tablet   3     BP 148/74  Pulse 88  Temp 100.4 F (38 C) (Oral)  Resp 16  SpO2 98%  Physical Exam  Vitals reviewed. Constitutional: She is oriented to person, place, and time. She appears well-developed and well-nourished. No distress.  HENT:  Head: Normocephalic and atraumatic.  Eyes: EOM are normal. Pupils are equal, round, and reactive to light.  Neck: Normal range of motion. Neck supple.  Cardiovascular: Normal rate and regular rhythm.   Pulmonary/Chest: Effort normal. No respiratory distress. She has no decreased breath sounds.  Abdominal: Soft. She exhibits no distension. There is no tenderness.  Musculoskeletal: Normal range of motion. She exhibits no edema.  Neurological: She is alert and oriented to person, place, and time. She has normal strength. No cranial nerve deficit or sensory deficit. Coordination normal. GCS eye subscore is 4. GCS  verbal subscore is 4. GCS motor subscore is 6.  Skin: Skin is warm and dry.  Psychiatric: She has a normal mood and affect. Her behavior is normal.    ED Course  Procedures (including critical care time)  Results for orders placed during the hospital encounter of 06/20/12  URINALYSIS, ROUTINE W REFLEX MICROSCOPIC      Component Value Range   Color, Urine YELLOW  YELLOW   APPearance CLEAR  CLEAR   Specific Gravity, Urine 1.024  1.005 - 1.030   pH 7.0  5.0 - 8.0   Glucose, UA >1000 (*) NEGATIVE mg/dL   Hgb urine dipstick SMALL (*) NEGATIVE   Bilirubin Urine NEGATIVE  NEGATIVE   Ketones, ur NEGATIVE  NEGATIVE mg/dL   Protein, ur >409 (*) NEGATIVE mg/dL   Urobilinogen, UA 1.0  0.0 - 1.0 mg/dL   Nitrite NEGATIVE  NEGATIVE   Leukocytes, UA NEGATIVE  NEGATIVE  CBC WITH DIFFERENTIAL      Component Value Range   WBC 8.0  4.0 - 10.5 K/uL   RBC 4.07  3.87 - 5.11 MIL/uL   Hemoglobin 10.8 (*) 12.0 - 15.0 g/dL   HCT 81.1 (*) 91.4 - 78.2 %   MCV 78.6  78.0 - 100.0 fL   MCH 26.5  26.0 - 34.0 pg   MCHC 33.8  30.0 - 36.0 g/dL   RDW 95.6  21.3 - 08.6 %   Platelets 239  150 - 400 K/uL   Neutrophils Relative 76  43 - 77 %   Neutro Abs 6.0  1.7 - 7.7 K/uL   Lymphocytes Relative 17  12 - 46 %   Lymphs Abs 1.4  0.7 - 4.0 K/uL   Monocytes Relative 6  3 - 12 %   Monocytes Absolute 0.5  0.1 - 1.0 K/uL   Eosinophils Relative 1  0 - 5 %   Eosinophils Absolute 0.0  0.0 - 0.7 K/uL   Basophils Relative 1  0 - 1 %   Basophils Absolute 0.0  0.0 - 0.1 K/uL  COMPREHENSIVE METABOLIC PANEL      Component Value Range   Sodium 135  135 - 145 mEq/L   Potassium 2.8 (*) 3.5 - 5.1 mEq/L   Chloride 96  96 - 112 mEq/L   CO2 27  19 - 32 mEq/L   Glucose, Bld 312 (*) 70 - 99 mg/dL   BUN 28 (*) 6 - 23 mg/dL   Creatinine, Ser 5.78 (*) 0.50 - 1.10 mg/dL   Calcium 9.4  8.4 - 46.9 mg/dL   Total Protein 6.5  6.0 -  8.3 g/dL   Albumin 2.5 (*) 3.5 - 5.2 g/dL   AST 17  0 - 37 U/L   ALT 10  0 - 35 U/L   Alkaline  Phosphatase 100  39 - 117 U/L   Total Bilirubin 0.4  0.3 - 1.2 mg/dL   GFR calc non Af Amer 25 (*) >90 mL/min   GFR calc Af Amer 29 (*) >90 mL/min  GLUCOSE, CAPILLARY      Component Value Range   Glucose-Capillary 293 (*) 70 - 99 mg/dL   Comment 1 Documented in Chart     Comment 2 Notify RN    URINE MICROSCOPIC-ADD ON      Component Value Range   Squamous Epithelial / LPF RARE  RARE   WBC, UA 0-2  <3 WBC/hpf   RBC / HPF 3-6  <3 RBC/hpf   Casts GRANULAR CAST (*) NEGATIVE  GLUCOSE, CAPILLARY      Component Value Range   Glucose-Capillary 218 (*) 70 - 99 mg/dL  CBC      Component Value Range   WBC 8.0  4.0 - 10.5 K/uL   RBC 4.20  3.87 - 5.11 MIL/uL   Hemoglobin 11.3 (*) 12.0 - 15.0 g/dL   HCT 21.3 (*) 08.6 - 57.8 %   MCV 78.6  78.0 - 100.0 fL   MCH 26.9  26.0 - 34.0 pg   MCHC 34.2  30.0 - 36.0 g/dL   RDW 46.9  62.9 - 52.8 %   Platelets 244  150 - 400 K/uL  CREATININE, SERUM      Component Value Range   Creatinine, Ser 1.67 (*) 0.50 - 1.10 mg/dL   GFR calc non Af Amer 26 (*) >90 mL/min   GFR calc Af Amer 30 (*) >90 mL/min  MAGNESIUM      Component Value Range   Magnesium 1.7  1.5 - 2.5 mg/dL  GLUCOSE, CAPILLARY      Component Value Range   Glucose-Capillary 208 (*) 70 - 99 mg/dL   Comment 1 Notify RN     Comment 2 Documented in Chart     DG Chest 2 View (Final result)   Result time:06/20/12 1927    Final result by Rad Results In Interface (06/20/12 19:27:36)    Narrative:   *RADIOLOGY REPORT*  Clinical Data: Fever, diabetes.  CHEST - 2 VIEW  Comparison: 10/30/2004  Findings: Mild cardiomegaly. Atheromatous aorta. No effusion. Minimal spurring in the lower thoracic spine. Patchy coarse right middle lobe subsegmental atelectasis, scarring, or interstitial infiltrate, best seen on the lateral projection.  IMPRESSION:  1. New patchy right middle lobe atelectasis , interstitial infiltrate, or scarring.   Original Report Authenticated By: D. Andria Rhein, MD      No results found.   No diagnosis found.    MDM  Pt w/ PMHx of DM and HTN now w/ hyperglycemia, fever and confusion. Son states poorly controlled DM. Seen yesterday at [facility] and started on insulin. Noted increased fatigue, decreased PO intake and confusion. Pt denies any complaints.   Exam: febrile 100.4, pulse 88, no hypoxia or resp distress, normotensive. GCS 14 - confused, sleepy but arousable. No focal neuro deficit. Neg brudzinski. abd soft and benign. Lungs CTAB. No skin lesions or rash noted.   DDx/Plan: concern for UTI or pneumonia. Will check u/a, CXR, cbc, cmp. Glucose on arrival 293 - doubt DKA. Will give 500cc IVF. Will refrain from CT head at this time in light of fever, no trauma and no  meningeal signs on exam.   Course: pt reassessed, vitals stable, mental status improved, CXR - concerning RML infiltrate - started on ceftriaxone and azithro. CURB65 places her at high risk 2/2 age, uremia and confusion. U/a neg for infection. Potassium 2.8 given IV and 40 PO potassium, glucose 312 - no acidosis. D/w internal medicine and pt admitted in stable condition.   1. Community acquired pneumonia   2. Confusion   3. Fever   4. Uremia   5. Hypokalemia   6. Diabetes mellitus   7. Encephalopathy acute   8. Hypertension            Audelia Hives, MD 06/21/12 [id number]

## 2012-06-20 NOTE — ED Notes (Signed)
Patient assisted up to the BR.  Amb with assistance.  Voided and BM

## 2012-06-20 NOTE — Progress Notes (Signed)
Notified Dr. Toniann Fail that patient's BP is 183/84 and temp is 101.2.  MD stated to give tylenol and Hydralazine prn. Will continue to monitor patient. Nelda Marseille, RN

## 2012-06-20 NOTE — ED Notes (Signed)
Patient back from xray, placed on the monitor.  Patient alert and cooperative but unsure of surroundings, picking at the wires

## 2012-06-21 DIAGNOSIS — I6200 Nontraumatic subdural hemorrhage, unspecified: Secondary | ICD-10-CM

## 2012-06-21 DIAGNOSIS — S065X9A Traumatic subdural hemorrhage with loss of consciousness of unspecified duration, initial encounter: Secondary | ICD-10-CM | POA: Diagnosis present

## 2012-06-21 DIAGNOSIS — F29 Unspecified psychosis not due to a substance or known physiological condition: Secondary | ICD-10-CM

## 2012-06-21 LAB — INFLUENZA PANEL BY PCR (TYPE A & B): H1N1 flu by pcr: NOT DETECTED

## 2012-06-21 LAB — CBC WITH DIFFERENTIAL/PLATELET
Basophils Absolute: 0 10*3/uL (ref 0.0–0.1)
HCT: 31.8 % — ABNORMAL LOW (ref 36.0–46.0)
Hemoglobin: 10.9 g/dL — ABNORMAL LOW (ref 12.0–15.0)
Lymphocytes Relative: 27 % (ref 12–46)
Monocytes Absolute: 0.5 10*3/uL (ref 0.1–1.0)
Monocytes Relative: 7 % (ref 3–12)
Neutro Abs: 5.1 10*3/uL (ref 1.7–7.7)
WBC: 7.7 10*3/uL (ref 4.0–10.5)

## 2012-06-21 LAB — COMPREHENSIVE METABOLIC PANEL
BUN: 25 mg/dL — ABNORMAL HIGH (ref 6–23)
Calcium: 9 mg/dL (ref 8.4–10.5)
GFR calc Af Amer: 31 mL/min — ABNORMAL LOW (ref >90)
Glucose, Bld: 202 mg/dL — ABNORMAL HIGH (ref 70–99)
Total Protein: 6.2 g/dL (ref 6.0–8.3)

## 2012-06-21 LAB — CREATININE, SERUM
Creatinine, Ser: 1.67 mg/dL — ABNORMAL HIGH (ref 0.50–1.10)
GFR calc Af Amer: 30 mL/min — ABNORMAL LOW (ref >90)

## 2012-06-21 LAB — MAGNESIUM: Magnesium: 1.7 mg/dL (ref 1.5–2.5)

## 2012-06-21 LAB — GLUCOSE, CAPILLARY
Glucose-Capillary: 160 mg/dL — ABNORMAL HIGH (ref 70–99)
Glucose-Capillary: 155 mg/dL — ABNORMAL HIGH (ref 70–99)

## 2012-06-21 LAB — OCCULT BLOOD X 1 CARD TO LAB, STOOL: Fecal Occult Bld: NEGATIVE

## 2012-06-21 LAB — HEMOGLOBIN A1C
Hgb A1c MFr Bld: 16.1 % — ABNORMAL HIGH (ref <5.7)
Mean Plasma Glucose: 415 mg/dL — ABNORMAL HIGH (ref <117)

## 2012-06-21 MED ORDER — LEVOFLOXACIN IN D5W 250 MG/50ML IV SOLN
250.0000 mg | INTRAVENOUS | Status: DC
  Administered 2012-06-23: 250 mg via INTRAVENOUS
  Filled 2012-06-21 (×2): qty 50

## 2012-06-21 MED ORDER — LEVOFLOXACIN IN D5W 500 MG/100ML IV SOLN: 500.0000 mg | INTRAVENOUS | Status: DC

## 2012-06-21 MED ORDER — LEVOFLOXACIN IN D5W 500 MG/100ML IV SOLN
500.0000 mg | Freq: Once | INTRAVENOUS | Status: AC
  Administered 2012-06-21: 500 mg via INTRAVENOUS
  Filled 2012-06-21: qty 100

## 2012-06-21 NOTE — Progress Notes (Signed)
[facility] PROGRESS NOTE  April [name] ION:[id number] DOB: [birth year] DOA: 06/20/2012 PCP: Default, Provider, MD  Assessment/Plan: Active Problems:  Encephalopathy acute  Community acquired pneumonia  Diabetes mellitus  Hyperlipidemia  Hypertension     1. Fever/CAP:  Patient presented with a 2 days of confusion, without any clear history of cough, and was found to be febrile, with a temperature of 101.2. CXR revealed new patchy right middle lobe atelectasis, interstitial infiltrate, or scarring. These findings were suggestive of CAP, although wcc was normal. She was commenced on iv Rocephin/Azithromycin, now day#2. Patient has defervesced overnight, and is much more lucid this AM. Will change antibiotic to Levaquin monotherapy, otherwise, continue supportive treatment. Patient is on empiric Tamiflu, due to epidemiologic considerations.  2. Diarrhea: Patient developed diarrhea on 06/20/12, without antecedent history of recent antibiotic therapy or sick contacts. Stool for C.Difficile PCR is pending. Meanwhile, managing with iv fluids.  3. Dehydration/AKI on CKD: Patient has possible chronic kidney disease. Unfortunately, baseline creatinine unknown at this time. Clinically, she appeared dehydrated at presentation, creatinine was 1.72, BUN 28. Managing with iv fluids. Diuretics/ARB are on hold. Following renal indices.  4. Hypokalemia: Secondary to GI loss and diuretic therapy. Repleting as indicated. Magnesium is normal. 5. Acute encephalopathy: Multifactorial and toxic-metabolic, secondary to acute problems outlined above. As of 06/21/12, mental status is already improved.   6. SDH: Head CT scan, done to evaluate AMS, revealed acute on chronic right frontal subdural hematoma, 13 mm thickness, with mild mass effect, no midline shift or hydrocephalus. Patient has no clear history of recent or recurrent falls. Dr Coletta Memos provided neurosurgical consultation and has opined that surgical  intervention is not indicated. Avoiding anti-platelet/anticoagulant medication and observing.  7. Uncontrolled diabetes mellitus type 2: CBG was in the 200s at presentation. Possibly secondary to noncompliance. Managing with diet, oral hypoglycemics, SSI and Lantus. HBA1C is pending.  8. Hypertension: Sub-optimally controlled. Managing with Clonidine patch, Norvasc, Coreg and prn Hydralazine. 9. Hyperlipidemia: On statin.  10. Anemia: Mild, normocytic and likely due to chronic disease. closely follow CBC. Stool guaiac is negative. .    Code Status: Full Code. Family Communication:  Disposition Plan: To be determined.   Brief narrative: 79 year-old female with history of diabetes mellitus type 2, hypertension and hyperlipidemia was brought to the ER by patient's son because of abdominal discomfort and diarrhea since 06/20/12 morning. In the ER was found to be febrile and confused. Chest x-ray showed possibility of pneumonia and UA was unremarkable. Patient empirically started on antibiotics for possible pneumonia. Was not on any recent antibiotics. Patient had just recently moved from [place] to patient's son's home. Patient's son is not aware how long she has not been taking her medications. Patient had recently gone to the urgent care clinic and got medication refill and was started on Lantus. She was admitted for further management.    Consultants:  Dr Coletta Memos, neurosurgeon.  Procedures:  CXR.  Head CT scan.   Antibiotics:  Azithromycin 06/20/12>>>  Rocephin 06/20/12>>>  Tamiflu 06/20/12>>>  HPI/Subjective: Still having diarrhea. No abdominal pain.   Objective: Vital signs in last 24 hours: Temp:  [99.2 F (37.3 C)-101.2 F (38.4 C)] 100.4 F (38 C) (01/16 0303) Pulse Rate:  [88-95] 95  (01/16 0303) Resp:  [14-24] 18  (01/16 0303) BP: (148-187)/(58-86) 175/86 mmHg (01/16 0303) SpO2:  [96 %-100 %] 99 % (01/16 0303) Weight:  [55 kg (121 lb 4.1 oz)-57.8 kg (127 lb  6.8 oz)] 57.8 kg (127  lb 6.8 oz) (01/16 0042) Weight change:  Last BM Date: 06/21/12  Intake/Output from previous day:       Physical Exam: General: Comfortable, alert, communicative, oriented, not short of breath at rest.  HEENT:  Mild clinical pallor, no jaundice, no conjunctival injection or discharge. Appears somewhat clinically dehydrated. NECK:  Supple, JVP not seen, no carotid bruits, no palpable lymphadenopathy, no palpable goiter. CHEST:  Clinically clear to auscultation, no wheezes, no crackles. HEART:  Sounds 1 and 2 heard, normal, regular, no murmurs. ABDOMEN:  Full, soft, non-tender, no palpable organomegaly, no palpable masses, normal bowel sounds. GENITALIA:  Not examined. LOWER EXTREMITIES:  No pitting edema, palpable peripheral pulses. MUSCULOSKELETAL SYSTEM:  Generalized osteoarthritic changes, otherwise, normal. CENTRAL NERVOUS SYSTEM:  No focal neurologic deficit on gross examination.  Lab Results:  Basename 06/21/12 0242 06/20/12 2330  WBC 7.7 8.0  HGB 10.9* 11.3*  HCT 31.8* 33.0*  PLT 235 244    Basename 06/21/12 0242 06/20/12 2330 06/20/12 1548  NA 136 -- 135  K 3.0* -- 2.8*  CL 99 -- 96  CO2 24 -- 27  GLUCOSE 202* -- 312*  BUN 25* -- 28*  CREATININE 1.64* 1.67* --  CALCIUM 9.0 -- 9.4   No results found for this or any previous visit (from the past 240 hour(s)).   Studies/Results: Dg Chest 2 View  06/20/2012  *RADIOLOGY REPORT*  Clinical Data: Fever, diabetes.  CHEST - 2 VIEW  Comparison:  10/30/2004  Findings: Mild cardiomegaly.  Atheromatous aorta.  No effusion. Minimal spurring in the lower thoracic spine. Patchy coarse right middle lobe subsegmental atelectasis, scarring, or interstitial infiltrate, best seen on the lateral projection.  IMPRESSION:  1.  New patchy right middle lobe atelectasis , interstitial infiltrate, or scarring.   Original Report Authenticated By: D. Andria Rhein, MD    Ct Head Wo Contrast  06/20/2012  *RADIOLOGY REPORT*   Clinical Data: Altered mental status, confusion  CT HEAD WITHOUT CONTRAST  Technique:  Contiguous axial images were obtained from the base of the skull through the vertex without contrast.  Comparison: None.  Findings: There is a right frontal subdural hematoma with acute and chronic components, measure up to 13 mm in thickness.  This results in mild mass effect upon the right cerebral hemisphere but, only to a degree of underlying atrophy, there is no significant midline shift.  No no subfalcine herniation.  No hydrocephalus. There is a fluid level in the sphenoid sinus. Atherosclerotic and physiologic intracranial calcifications.  Patchy areas of hypoattenuation in deep and periventricular white matter bilaterally.  Mild atrophy with resultant prominence of ventricles and sulci.  Acute infarct may be inapparent on noncontrast CT.  IMPRESSION:  1. Acute on chronic right frontal subdural hematoma, 13 mm thickness, with mild mass effect, no midline shift or hydrocephalus. 2.  Atrophy with nonspecific white matter changes as above. I telephoned the critical test results to Dr. Toniann Fail at the time of interpretation.   Original Report Authenticated By: D. Andria Rhein, MD     Medications: Scheduled Meds:   . amLODipine  10 mg Oral Daily  . atorvastatin  40 mg Oral QPM  . azithromycin  500 mg Intravenous Q24H  . carvedilol  25 mg Oral BID WC  . cefTRIAXone (ROCEPHIN)  IV  1 g Intravenous Q24H  . cloNIDine  0.2 mg Transdermal Weekly  . insulin aspart  0-15 Units Subcutaneous TID WC  . insulin glargine  10 Units Subcutaneous Daily  . irbesartan  300  mg Oral Daily  . linagliptin  5 mg Oral Daily  . oseltamivir  75 mg Oral Daily  . pneumococcal 23 valent vaccine  0.5 mL Intramuscular Tomorrow-1000  . sodium chloride  3 mL Intravenous Q12H   Continuous Infusions:   . sodium chloride 75 mL/hr at 06/20/12 2327   PRN Meds:.acetaminophen, acetaminophen, hydrALAZINE, ondansetron (ZOFRAN) IV,  ondansetron    LOS: 1 day   Marcques Wrightsman,CHRISTOPHER  [facility] Pager [phone]. If 8PM-8AM, please contact night-coverage at [email or web address], password [facility] 06/21/2012, 7:09 AM  LOS: 1 day

## 2012-06-21 NOTE — Consult Note (Signed)
Reason for Consult:Right sided subdural hematoma Referring Physician: Wyonia Fontanella is an 79 y.o. female.  HPI: 79 year-old female with history of diabetes mellitus type 2, hypertension and hyperlipidemia was brought to the ER by patient's son because of abdominal discomfort since today morning. In the ER patient also was found to be febrile and confused. Chest x-ray showed possibility of pneumonia and UA was unremarkable. Patient empirically started on antibiotics for possible pneumonia. Due to confusion a head ct was ordered which revealed a very small subdural hematoma. I was consulted for possible treatment. No seizure activity noted. No family available at this time for further information.    Past Medical History  Diagnosis Date  . Diabetes mellitus   . Hypertension   . Hyperlipidemia     History reviewed. No pertinent past surgical history.  Family History  Problem Relation Age of Onset  . Diabetes Mellitus II Son     Social History:  reports that she has never smoked. She does not have any smokeless tobacco history on file. She reports that she does not drink alcohol or use illicit drugs.  Allergies: No Known Allergies  Medications: I have reviewed the patient's current medications.  Results for orders placed during the hospital encounter of 06/20/12 (from the past 48 hour(s))  CBC WITH DIFFERENTIAL     Status: Abnormal   Collection Time   06/20/12  3:48 PM      Component Value Range Comment   WBC 8.0  4.0 - 10.5 K/uL    RBC 4.07  3.87 - 5.11 MIL/uL    Hemoglobin 10.8 (*) 12.0 - 15.0 g/dL    HCT 40.9 (*) 81.1 - 46.0 %    MCV 78.6  78.0 - 100.0 fL    MCH 26.5  26.0 - 34.0 pg    MCHC 33.8  30.0 - 36.0 g/dL    RDW 91.4  78.2 - 95.6 %    Platelets 239  150 - 400 K/uL    Neutrophils Relative 76  43 - 77 %    Neutro Abs 6.0  1.7 - 7.7 K/uL    Lymphocytes Relative 17  12 - 46 %    Lymphs Abs 1.4  0.7 - 4.0 K/uL    Monocytes Relative 6  3 - 12 %    Monocytes  Absolute 0.5  0.1 - 1.0 K/uL    Eosinophils Relative 1  0 - 5 %    Eosinophils Absolute 0.0  0.0 - 0.7 K/uL    Basophils Relative 1  0 - 1 %    Basophils Absolute 0.0  0.0 - 0.1 K/uL   COMPREHENSIVE METABOLIC PANEL     Status: Abnormal   Collection Time   06/20/12  3:48 PM      Component Value Range Comment   Sodium 135  135 - 145 mEq/L    Potassium 2.8 (*) 3.5 - 5.1 mEq/L    Chloride 96  96 - 112 mEq/L    CO2 27  19 - 32 mEq/L    Glucose, Bld 312 (*) 70 - 99 mg/dL    BUN 28 (*) 6 - 23 mg/dL    Creatinine, Ser 2.13 (*) 0.50 - 1.10 mg/dL    Calcium 9.4  8.4 - 08.6 mg/dL    Total Protein 6.5  6.0 - 8.3 g/dL    Albumin 2.5 (*) 3.5 - 5.2 g/dL    AST 17  0 - 37 U/L    ALT 10  0 - 35 U/L    Alkaline Phosphatase 100  39 - 117 U/L    Total Bilirubin 0.4  0.3 - 1.2 mg/dL    GFR calc non Af Amer 25 (*) >90 mL/min    GFR calc Af Amer 29 (*) >90 mL/min   GLUCOSE, CAPILLARY     Status: Abnormal   Collection Time   06/20/12  3:52 PM      Component Value Range Comment   Glucose-Capillary 293 (*) 70 - 99 mg/dL    Comment 1 Documented in Chart      Comment 2 Notify RN     URINALYSIS, ROUTINE W REFLEX MICROSCOPIC     Status: Abnormal   Collection Time   06/20/12  6:30 PM      Component Value Range Comment   Color, Urine YELLOW  YELLOW    APPearance CLEAR  CLEAR    Specific Gravity, Urine 1.024  1.005 - 1.030    pH 7.0  5.0 - 8.0    Glucose, UA >1000 (*) NEGATIVE mg/dL    Hgb urine dipstick SMALL (*) NEGATIVE    Bilirubin Urine NEGATIVE  NEGATIVE    Ketones, ur NEGATIVE  NEGATIVE mg/dL    Protein, ur >161 (*) NEGATIVE mg/dL    Urobilinogen, UA 1.0  0.0 - 1.0 mg/dL    Nitrite NEGATIVE  NEGATIVE    Leukocytes, UA NEGATIVE  NEGATIVE   URINE MICROSCOPIC-ADD ON     Status: Abnormal   Collection Time   06/20/12  6:30 PM      Component Value Range Comment   Squamous Epithelial / LPF RARE  RARE    WBC, UA 0-2  <3 WBC/hpf    RBC / HPF 3-6  <3 RBC/hpf    Casts GRANULAR CAST (*) NEGATIVE     GLUCOSE, CAPILLARY     Status: Abnormal   Collection Time   06/20/12  9:21 PM      Component Value Range Comment   Glucose-Capillary 218 (*) 70 - 99 mg/dL   CBC     Status: Abnormal   Collection Time   06/20/12 11:30 PM      Component Value Range Comment   WBC 8.0  4.0 - 10.5 K/uL    RBC 4.20  3.87 - 5.11 MIL/uL    Hemoglobin 11.3 (*) 12.0 - 15.0 g/dL    HCT 09.6 (*) 04.5 - 46.0 %    MCV 78.6  78.0 - 100.0 fL    MCH 26.9  26.0 - 34.0 pg    MCHC 34.2  30.0 - 36.0 g/dL    RDW 40.9  81.1 - 91.4 %    Platelets 244  150 - 400 K/uL   CREATININE, SERUM     Status: Abnormal   Collection Time   06/20/12 11:30 PM      Component Value Range Comment   Creatinine, Ser 1.67 (*) 0.50 - 1.10 mg/dL    GFR calc non Af Amer 26 (*) >90 mL/min    GFR calc Af Amer 30 (*) >90 mL/min   MAGNESIUM     Status: Normal   Collection Time   06/20/12 11:30 PM      Component Value Range Comment   Magnesium 1.7  1.5 - 2.5 mg/dL   AMMONIA     Status: Normal   Collection Time   06/20/12 11:35 PM      Component Value Range Comment   Ammonia 47  11 - 60 umol/L   GLUCOSE,  CAPILLARY     Status: Abnormal   Collection Time   06/20/12 11:35 PM      Component Value Range Comment   Glucose-Capillary 208 (*) 70 - 99 mg/dL    Comment 1 Notify RN      Comment 2 Documented in Chart       Dg Chest 2 View  06/20/2012  *RADIOLOGY REPORT*  Clinical Data: Fever, diabetes.  CHEST - 2 VIEW  Comparison:  10/30/2004  Findings: Mild cardiomegaly.  Atheromatous aorta.  No effusion. Minimal spurring in the lower thoracic spine. Patchy coarse right middle lobe subsegmental atelectasis, scarring, or interstitial infiltrate, best seen on the lateral projection.  IMPRESSION:  1.  New patchy right middle lobe atelectasis , interstitial infiltrate, or scarring.   Original Report Authenticated By: D. Andria Rhein, MD    Ct Head Wo Contrast  06/20/2012  *RADIOLOGY REPORT*  Clinical Data: Altered mental status, confusion  CT HEAD WITHOUT  CONTRAST  Technique:  Contiguous axial images were obtained from the base of the skull through the vertex without contrast.  Comparison: None.  Findings: There is a right frontal subdural hematoma with acute and chronic components, measure up to 13 mm in thickness.  This results in mild mass effect upon the right cerebral hemisphere but, only to a degree of underlying atrophy, there is no significant midline shift.  No no subfalcine herniation.  No hydrocephalus. There is a fluid level in the sphenoid sinus. Atherosclerotic and physiologic intracranial calcifications.  Patchy areas of hypoattenuation in deep and periventricular white matter bilaterally.  Mild atrophy with resultant prominence of ventricles and sulci.  Acute infarct may be inapparent on noncontrast CT.  IMPRESSION:  1. Acute on chronic right frontal subdural hematoma, 13 mm thickness, with mild mass effect, no midline shift or hydrocephalus. 2.  Atrophy with nonspecific white matter changes as above. I telephoned the critical test results to Dr. Toniann Fail at the time of interpretation.   Original Report Authenticated By: D. Andria Rhein, MD     Review of Systems  Gastrointestinal: Positive for nausea, abdominal pain and diarrhea.  Neurological:       Intermittent confusion   Blood pressure 175/86, pulse 95, temperature 100.4 F (38 C), temperature source Oral, resp. rate 18, height 5' (1.524 m), weight 57.8 kg (127 lb 6.8 oz), SpO2 99.00%. Physical Exam  Constitutional: She appears well-developed.  HENT:  Head: Normocephalic.  Eyes: Conjunctivae normal and EOM are normal. Pupils are equal, round, and reactive to light.  Neck: Normal range of motion. Neck supple.  Cardiovascular: Normal rate.   Respiratory: Effort normal and breath sounds normal.  GI: Soft.  Musculoskeletal: Normal range of motion.  Neurological: She is alert. No cranial nerve deficit.       Will follow some commands Decreased hearing bilaterally Moves all  extremities, unable to do detailed testing secondary to mental status Did not attempt to have patient get out of bed  Skin: Skin is warm and dry.    Assessment/Plan: 79 yo with small subdural. No surgical indication at this time. Do not believe that this is the cause of the confusion. The dehydration concomitant with the diarrhea may be a more likely source of the confusion. No need to repeat scan unless neuro exam changes. No need for anticonvulsants. Basal cisterns are widely patent, there is no midline shift, and due to atrophy unlikely to ever need surgical decompression if subdural remains this size.   Ramond Darnell L 06/21/2012, 3:08 AM

## 2012-06-21 NOTE — Progress Notes (Signed)
Called report to [facility], RN on [phone]. Transferred patient to 4N07 via bed and telemetry.  Son at bedside. Will continue to monitor patient. Jerrye Bushy

## 2012-06-21 NOTE — Progress Notes (Signed)
Patient ID: April [name], female   DOB: 02-10-1922, 79 y.o.   MRN: [id number] BP 164/71  Pulse 88  Temp 98.6 F (37 C) (Oral)  Resp 18  Ht 5' (1.524 m)  Wt 61.3 kg (135 lb 2.3 oz)  BMI 26.39 kg/m2  SpO2 99% Alert and oriented to person, place, time Follows all commands Moving extremities quite well Perrl, full eom Symmetric facies Tongue and Uvula midline Stable, good exam. No current concerns about subdural.

## 2012-06-21 NOTE — H&P (Addendum)
April [name] is an 79 y.o. female.   Patient was seen and examined on June 20, 2012. PCP - none. History obtained from patient's son and the ER physician. Patient is mildly confused. Chief Complaint: Abdominal discomfort. HPI: 79 year-old female with history of diabetes mellitus type 2, hypertension and hyperlipidemia was brought to the ER by patient's son because of abdominal discomfort since today morning. In the ER patient also was found to be febrile and confused. Chest x-ray showed possibility of pneumonia and UA was unremarkable. Patient empirically started on antibiotics for possible pneumonia. On further questioning patient's son stated that patient has been found to be confused for last 2 days. Patient also has been having diarrhea today. Was not on any recent antibiotics. Patient had abdominal discomfort which patient is not able to correctly characterize. Presently abdomen appears benign and patient denies any abdominal pain. Denies any nausea vomiting chest pain or shortness of breath. Patient had just recently moved from [place] to patient's son's home. Patient's son is not aware how long she has not been taking her medications. Patient had recently gone to the urgent care clinic and got medication refill and was started on Lantus.  Past Medical History  Diagnosis Date  . Diabetes mellitus   . Hypertension   . Hyperlipidemia     History reviewed. No pertinent past surgical history.  Family History  Problem Relation Age of Onset  . Diabetes Mellitus II Son    Social History:  reports that she has never smoked. She does not have any smokeless tobacco history on file. She reports that she does not drink alcohol or use illicit drugs.  Allergies: No Known Allergies  Medications Prior to Admission  Medication Sig Dispense Refill  . amLODipine (NORVASC) 10 MG tablet Take 1 tablet (10 mg total) by mouth daily.  30 tablet  1  . aspirin EC 81 MG tablet Take 81 mg by mouth daily.       Marland Kitchen atorvastatin (LIPITOR) 40 MG tablet Take 40 mg by mouth every evening.      . carvedilol (COREG) 25 MG tablet Take 1 tablet (25 mg total) by mouth 2 (two) times daily with a meal.  60 tablet  3  . cloNIDine (CATAPRES - DOSED IN MG/24 HR) 0.2 mg/24hr patch Place 1 patch (0.2 mg total) onto the skin once a week.  4 patch  3  . hydrochlorothiazide (HYDRODIURIL) 25 MG tablet Take 1 tablet (25 mg total) by mouth daily.  30 tablet  1  . insulin glargine (LANTUS) 100 UNIT/ML injection Inject 10 Units into the skin daily.      . sitaGLIPtin (JANUVIA) 50 MG tablet Take 1 tablet (50 mg total) by mouth daily.  30 tablet  1  . telmisartan (MICARDIS) 80 MG tablet Take 1 tablet (80 mg total) by mouth daily.  30 tablet  3    Results for orders placed during the hospital encounter of 06/20/12 (from the past 48 hour(s))  CBC WITH DIFFERENTIAL     Status: Abnormal   Collection Time   06/20/12  3:48 PM      Component Value Range Comment   WBC 8.0  4.0 - 10.5 K/uL    RBC 4.07  3.87 - 5.11 MIL/uL    Hemoglobin 10.8 (*) 12.0 - 15.0 g/dL    HCT 32.4 (*) 40.1 - 46.0 %    MCV 78.6  78.0 - 100.0 fL    MCH 26.5  26.0 - 34.0 pg  MCHC 33.8  30.0 - 36.0 g/dL    RDW 16.1  09.6 - 04.5 %    Platelets 239  150 - 400 K/uL    Neutrophils Relative 76  43 - 77 %    Neutro Abs 6.0  1.7 - 7.7 K/uL    Lymphocytes Relative 17  12 - 46 %    Lymphs Abs 1.4  0.7 - 4.0 K/uL    Monocytes Relative 6  3 - 12 %    Monocytes Absolute 0.5  0.1 - 1.0 K/uL    Eosinophils Relative 1  0 - 5 %    Eosinophils Absolute 0.0  0.0 - 0.7 K/uL    Basophils Relative 1  0 - 1 %    Basophils Absolute 0.0  0.0 - 0.1 K/uL   COMPREHENSIVE METABOLIC PANEL     Status: Abnormal   Collection Time   06/20/12  3:48 PM      Component Value Range Comment   Sodium 135  135 - 145 mEq/L    Potassium 2.8 (*) 3.5 - 5.1 mEq/L    Chloride 96  96 - 112 mEq/L    CO2 27  19 - 32 mEq/L    Glucose, Bld 312 (*) 70 - 99 mg/dL    BUN 28 (*) 6 - 23 mg/dL     Creatinine, Ser 4.09 (*) 0.50 - 1.10 mg/dL    Calcium 9.4  8.4 - 81.1 mg/dL    Total Protein 6.5  6.0 - 8.3 g/dL    Albumin 2.5 (*) 3.5 - 5.2 g/dL    AST 17  0 - 37 U/L    ALT 10  0 - 35 U/L    Alkaline Phosphatase 100  39 - 117 U/L    Total Bilirubin 0.4  0.3 - 1.2 mg/dL    GFR calc non Af Amer 25 (*) >90 mL/min    GFR calc Af Amer 29 (*) >90 mL/min   GLUCOSE, CAPILLARY     Status: Abnormal   Collection Time   06/20/12  3:52 PM      Component Value Range Comment   Glucose-Capillary 293 (*) 70 - 99 mg/dL    Comment 1 Documented in Chart      Comment 2 Notify RN     URINALYSIS, ROUTINE W REFLEX MICROSCOPIC     Status: Abnormal   Collection Time   06/20/12  6:30 PM      Component Value Range Comment   Color, Urine YELLOW  YELLOW    APPearance CLEAR  CLEAR    Specific Gravity, Urine 1.024  1.005 - 1.030    pH 7.0  5.0 - 8.0    Glucose, UA >1000 (*) NEGATIVE mg/dL    Hgb urine dipstick SMALL (*) NEGATIVE    Bilirubin Urine NEGATIVE  NEGATIVE    Ketones, ur NEGATIVE  NEGATIVE mg/dL    Protein, ur >914 (*) NEGATIVE mg/dL    Urobilinogen, UA 1.0  0.0 - 1.0 mg/dL    Nitrite NEGATIVE  NEGATIVE    Leukocytes, UA NEGATIVE  NEGATIVE   URINE MICROSCOPIC-ADD ON     Status: Abnormal   Collection Time   06/20/12  6:30 PM      Component Value Range Comment   Squamous Epithelial / LPF RARE  RARE    WBC, UA 0-2  <3 WBC/hpf    RBC / HPF 3-6  <3 RBC/hpf    Casts GRANULAR CAST (*) NEGATIVE   GLUCOSE, CAPILLARY  Status: Abnormal   Collection Time   06/20/12  9:21 PM      Component Value Range Comment   Glucose-Capillary 218 (*) 70 - 99 mg/dL   CBC     Status: Abnormal   Collection Time   06/20/12 11:30 PM      Component Value Range Comment   WBC 8.0  4.0 - 10.5 K/uL    RBC 4.20  3.87 - 5.11 MIL/uL    Hemoglobin 11.3 (*) 12.0 - 15.0 g/dL    HCT 45.4 (*) 09.8 - 46.0 %    MCV 78.6  78.0 - 100.0 fL    MCH 26.9  26.0 - 34.0 pg    MCHC 34.2  30.0 - 36.0 g/dL    RDW 11.9  14.7 - 82.9 %     Platelets 244  150 - 400 K/uL   CREATININE, SERUM     Status: Abnormal   Collection Time   06/20/12 11:30 PM      Component Value Range Comment   Creatinine, Ser 1.67 (*) 0.50 - 1.10 mg/dL    GFR calc non Af Amer 26 (*) >90 mL/min    GFR calc Af Amer 30 (*) >90 mL/min   MAGNESIUM     Status: Normal   Collection Time   06/20/12 11:30 PM      Component Value Range Comment   Magnesium 1.7  1.5 - 2.5 mg/dL   AMMONIA     Status: Normal   Collection Time   06/20/12 11:35 PM      Component Value Range Comment   Ammonia 47  11 - 60 umol/L   GLUCOSE, CAPILLARY     Status: Abnormal   Collection Time   06/20/12 11:35 PM      Component Value Range Comment   Glucose-Capillary 208 (*) 70 - 99 mg/dL    Comment 1 Notify RN      Comment 2 Documented in Chart      Dg Chest 2 View  06/20/2012  *RADIOLOGY REPORT*  Clinical Data: Fever, diabetes.  CHEST - 2 VIEW  Comparison:  10/30/2004  Findings: Mild cardiomegaly.  Atheromatous aorta.  No effusion. Minimal spurring in the lower thoracic spine. Patchy coarse right middle lobe subsegmental atelectasis, scarring, or interstitial infiltrate, best seen on the lateral projection.  IMPRESSION:  1.  New patchy right middle lobe atelectasis , interstitial infiltrate, or scarring.   Original Report Authenticated By: D. Andria Rhein, MD    Ct Head Wo Contrast  06/20/2012  *RADIOLOGY REPORT*  Clinical Data: Altered mental status, confusion  CT HEAD WITHOUT CONTRAST  Technique:  Contiguous axial images were obtained from the base of the skull through the vertex without contrast.  Comparison: None.  Findings: There is a right frontal subdural hematoma with acute and chronic components, measure up to 13 mm in thickness.  This results in mild mass effect upon the right cerebral hemisphere but, only to a degree of underlying atrophy, there is no significant midline shift.  No no subfalcine herniation.  No hydrocephalus. There is a fluid level in the sphenoid sinus.  Atherosclerotic and physiologic intracranial calcifications.  Patchy areas of hypoattenuation in deep and periventricular white matter bilaterally.  Mild atrophy with resultant prominence of ventricles and sulci.  Acute infarct may be inapparent on noncontrast CT.  IMPRESSION:  1. Acute on chronic right frontal subdural hematoma, 13 mm thickness, with mild mass effect, no midline shift or hydrocephalus. 2.  Atrophy with nonspecific white matter changes  as above. I telephoned the critical test results to Dr. Toniann Fail at the time of interpretation.   Original Report Authenticated By: D. Andria Rhein, MD     Review of Systems  Constitutional: Negative.   HENT: Negative.   Eyes: Negative.   Respiratory: Negative.   Cardiovascular: Negative.   Gastrointestinal: Positive for diarrhea.       Abdominal discomfort.  Genitourinary: Negative.   Musculoskeletal: Negative.   Skin: Negative.   Neurological:       Confusion.  Endo/Heme/Allergies: Negative.   Psychiatric/Behavioral: Negative.     Blood pressure 167/58, pulse 90, temperature 99.2 F (37.3 C), temperature source Axillary, resp. rate 20, height 5' (1.524 m), weight 57.8 kg (127 lb 6.8 oz), SpO2 97.00%. Physical Exam  Constitutional: She is oriented to person, place, and time. She appears well-developed and well-nourished. No distress.  HENT:  Head: Normocephalic and atraumatic.  Right Ear: External ear normal.  Left Ear: External ear normal.  Nose: Nose normal.  Mouth/Throat: Oropharynx is clear and moist. No oropharyngeal exudate.  Eyes: Conjunctivae normal are normal. Pupils are equal, round, and reactive to light. Right eye exhibits no discharge. Left eye exhibits no discharge. No scleral icterus.  Neck: Normal range of motion. Neck supple.  Cardiovascular: Normal rate and regular rhythm.   Respiratory: Effort normal and breath sounds normal. No respiratory distress. She has no wheezes. She has no rales.  GI: Soft. Bowel sounds  are normal. She exhibits no distension. There is no tenderness. There is no rebound.  Musculoskeletal: She exhibits no edema and no tenderness.  Neurological: She is alert and oriented to person, place, and time.       Moves all extremities. Has periods of confusion.  Skin: Skin is warm and dry. She is not diaphoretic.     Assessment/Plan #1. Acute encephalopathy - I have ordered a CT head scan. Patient's confusion could be from the fever but at this time further recommendations based on CT findings. #2. Fever - probably from possible pneumonia but since patient has diarrhea we will have to rule out C. difficile colitis. If patient's diarrhea does not improve or if complaints of any of significant abdominal pain or if lactic acid is high then may need CT abdomen pelvis. Check flu panel. #3. Uncontrolled diabetes mellitus type 2 secondary to noncompliance - I have ordered one dose of Lantus 5 units subcutaneous now and we will closely follow sliding scale coverage and continue her home dose of Lantus 10 units subcutaneous daily in a.m. #4. Hypertension - continue home medications except for HCTZ. When necessary IV hydralazine for systolic blood pressure more than 160. #5. Hyperlipidemia - continue home medications. #6. Anemia - closely follow CBC. Check stool for occult blood. #7. Possible chronic kidney disease - follow metabolic panel and intake output. At this time patient is getting gently hydrated. Hold HCTZ. Any further worsening of creatinine need to hold ARB. We don't have patient's baseline creatinine. #8. Hypokalemia probably from diarrhea - patient has been given oral and IV placement. Recheck metabolic panel in a.m. along with magnesium levels.  CODE STATUS - full code.  Aldrick Derrig N. 06/21/2012, 1:51 AM Addendum - patient's CT of the head shows subdural hematoma acute on chronic. Dr. Kathaleen Grinder neurosurgeon on call has been consulted. Patient will be closely followed on neuro  telemetry floor. Patient's son updated.  Midge Minium

## 2012-06-22 LAB — CBC
MCH: 25.9 pg — ABNORMAL LOW (ref 26.0–34.0)
Platelets: 227 10*3/uL (ref 150–400)
RBC: 4.1 MIL/uL (ref 3.87–5.11)
RDW: 13.9 % (ref 11.5–15.5)
WBC: 5.2 10*3/uL (ref 4.0–10.5)

## 2012-06-22 LAB — BASIC METABOLIC PANEL
CO2: 23 mEq/L (ref 19–32)
Calcium: 8.5 mg/dL (ref 8.4–10.5)
Chloride: 104 mEq/L (ref 96–112)
GFR calc Af Amer: 34 mL/min — ABNORMAL LOW (ref >90)
Sodium: 137 mEq/L (ref 135–145)

## 2012-06-22 LAB — GLUCOSE, CAPILLARY: Glucose-Capillary: 102 mg/dL — ABNORMAL HIGH (ref 70–99)

## 2012-06-22 MED ORDER — CLONIDINE HCL 0.3 MG/24HR TD PTWK: 0.3000 mg | MEDICATED_PATCH | TRANSDERMAL | Status: DC

## 2012-06-22 MED ORDER — SODIUM CHLORIDE 0.9 % IV SOLN
INTRAVENOUS | Status: DC
  Administered 2012-06-22 (×2): via INTRAVENOUS

## 2012-06-22 MED ORDER — POTASSIUM CHLORIDE CRYS ER 20 MEQ PO TBCR
40.0000 meq | EXTENDED_RELEASE_TABLET | Freq: Once | ORAL | Status: AC
  Administered 2012-06-22: 40 meq via ORAL
  Filled 2012-06-22 (×2): qty 2

## 2012-06-22 MED ORDER — POTASSIUM CHLORIDE CRYS ER 20 MEQ PO TBCR
40.0000 meq | EXTENDED_RELEASE_TABLET | Freq: Once | ORAL | Status: AC
  Administered 2012-06-22: 40 meq via ORAL
  Filled 2012-06-22: qty 2

## 2012-06-22 NOTE — ED Provider Notes (Signed)
I have personally seen and examined the patient.  I have discussed the plan of care with the resident.  I have reviewed the documentation on PMH/FH/Soc. History.  I have reviewed the documentation of the resident and agree.  Pt in no distress on my exam.  She was awake/alert, no focal motor deficits.  Given fever and CXR findings, started on antibiotics and due to age she was admitted.  I personally viewed the CXR  Joya Gaskins, MD 06/22/12 1157

## 2012-06-22 NOTE — Progress Notes (Signed)
[facility] PROGRESS NOTE  Aleida Crandell ZOX:[id number] DOB: Feb 21, 1922 DOA: 06/20/2012 PCP: Default, Provider, MD  Assessment/Plan: Active Problems:  Encephalopathy acute  Community acquired pneumonia  Diabetes mellitus  Hyperlipidemia  Hypertension  SDH (subdural hematoma)     1. Fever/CAP:  Patient presented with a 2 days of confusion, without any clear history of cough, and was found to be febrile, with a temperature of 101.2. CXR revealed new patchy right middle lobe atelectasis, interstitial infiltrate, or scarring. These findings were suggestive of CAP, although wcc was normal. She was commenced on iv Rocephin/Azithromycin, but switched to Levaquin monotherapy on 06/21/12, now day#2. Patient has defervesced and is feeling considerably better. She was initially placed on empiric Tamiflu, due to epidemiologic considerations., but as Flu PCR is negative, this has been discontinued.  2. Diarrhea: Patient developed diarrhea on 06/20/12, without antecedent history of recent antibiotic therapy or sick contacts. Stool C.Difficile PCR is fortunately negative, and diarrhea appears to have resolved today. Likely, this was a self-limited viral enteritis. Managing with iv fluids.  3. Dehydration/AKI on CKD: Patient has possible chronic kidney disease. Unfortunately, baseline creatinine unknown at this time. Clinically, she appeared dehydrated at presentation, creatinine was 1.72, BUN 28. Managing with iv fluids. Diuretics/ARB are on hold, and today, creatinine is improved at 1.54. Following renal indices.  4. Hypokalemia: Secondary to GI loss and diuretic therapy. Repleting as indicated. Magnesium is normal. 5. Acute encephalopathy: Multifactorial and toxic-metabolic, secondary to acute problems outlined above. As of 06/22/12, mental status is back to baseline.    6. SDH: Head CT scan, done to evaluate AMS, revealed acute on chronic right frontal subdural hematoma, 13 mm thickness, with mild mass  effect, no midline shift or hydrocephalus. Patient has no clear history of recent or recurrent falls. Dr Coletta Memos provided neurosurgical consultation and has opined that surgical intervention is not indicated. Avoiding anti-platelet/anticoagulant medication and observing. Stable.  7. Uncontrolled diabetes mellitus type 2: CBG was in the 200s at presentation. Possibly secondary to noncompliance. Managing with diet, oral hypoglycemics, SSI and Lantus, with satisfactory clinical response. [name] is 16.1.  8. Hypertension: Sub-optimally controlled. Managing with Clonidine patch, Norvasc, Coreg and prn Hydralazine. 9. Hyperlipidemia: On statin.  10. Anemia: Mild, normocytic and likely due to chronic disease. closely follow CBC. Stool guaiac is negative. .    Code Status: Full Code. Family Communication:  Disposition Plan: To be determined.   Brief narrative: 79 year-old female with history of diabetes mellitus type 2, hypertension and hyperlipidemia was brought to the ER by patient's son because of abdominal discomfort and diarrhea since 06/20/12 morning. In the ER was found to be febrile and confused. Chest x-ray showed possibility of pneumonia and UA was unremarkable. Patient empirically started on antibiotics for possible pneumonia. Was not on any recent antibiotics. Patient had just recently moved from [place] to patient's son's home. Patient's son is not aware how long she has not been taking her medications. Patient had recently gone to the urgent care clinic and got medication refill and was started on Lantus. She was admitted for further management.    Consultants:  Dr Coletta Memos, neurosurgeon.  Procedures:  CXR.  Head CT scan.   Antibiotics:  Azithromycin 06/20/12-06/21/12.  Rocephin 06/20/12-06/21/12  Tamiflu 06/20/12-06/22/12.   Levaquin 06/21/12>>>  HPI/Subjective: Asymptomatic.   Objective: Vital signs in last 24 hours: Temp:  [97.7 F (36.5 C)-100.2 F (37.9 C)]  97.7 F (36.5 C) (01/17 1300) Pulse Rate:  [70-88] 79  (01/17 1300) Resp:  [  18-20] 20  (01/17 1300) BP: (149-183)/(59-71) 183/71 mmHg (01/17 1300) SpO2:  [99 %-100 %] 100 % (01/17 1300) Weight:  [62.3 kg (137 lb 5.6 oz)] 62.3 kg (137 lb 5.6 oz) (01/17 0600) Weight change: 7.3 kg (16 lb 1.5 oz) Last BM Date: 06/22/12  Intake/Output from previous day: 01/16 0701 - 01/17 0700 In: 1040 [P.O.:240; I.V.:700; IV Piggyback:100] Out: -      Physical Exam: General: Comfortable, alert, communicative, oriented, not short of breath at rest.  HEENT:  Mild clinical pallor, no jaundice, no conjunctival injection or discharge. Hydration is fair. NECK:  Supple, JVP not seen, no carotid bruits, no palpable lymphadenopathy, no palpable goiter. CHEST:  Clinically clear to auscultation, no wheezes, no crackles. HEART:  Sounds 1 and 2 heard, normal, regular, no murmurs. ABDOMEN:  Full, soft, non-tender, no palpable organomegaly, no palpable masses, normal bowel sounds. GENITALIA:  Not examined. LOWER EXTREMITIES:  No pitting edema, palpable peripheral pulses. MUSCULOSKELETAL SYSTEM:  Generalized osteoarthritic changes, otherwise, normal. CENTRAL NERVOUS SYSTEM:  No focal neurologic deficit on gross examination.  Lab Results:  Basename 06/22/12 0600 06/21/12 0242  WBC 5.2 7.7  HGB 10.6* 10.9*  HCT 32.5* 31.8*  PLT 227 235    Basename 06/22/12 0600 06/21/12 0242  NA 137 136  K 2.9* 3.0*  CL 104 99  CO2 23 24  GLUCOSE 101* 202*  BUN 20 25*  CREATININE 1.54* 1.64*  CALCIUM 8.5 9.0   Recent Results (from the past 240 hour(s))  CLOSTRIDIUM DIFFICILE BY PCR     Status: Normal   Collection Time   06/21/12  1:02 AM      Component Value Range Status Comment   C difficile by pcr NEGATIVE  NEGATIVE Final      Studies/Results: Dg Chest 2 View  06/20/2012  *RADIOLOGY REPORT*  Clinical Data: Fever, diabetes.  CHEST - 2 VIEW  Comparison:  10/30/2004  Findings: Mild cardiomegaly.  Atheromatous  aorta.  No effusion. Minimal spurring in the lower thoracic spine. Patchy coarse right middle lobe subsegmental atelectasis, scarring, or interstitial infiltrate, best seen on the lateral projection.  IMPRESSION:  1.  New patchy right middle lobe atelectasis , interstitial infiltrate, or scarring.   Original Report Authenticated By: D. Andria Rhein, MD    Ct Head Wo Contrast  06/20/2012  *RADIOLOGY REPORT*  Clinical Data: Altered mental status, confusion  CT HEAD WITHOUT CONTRAST  Technique:  Contiguous axial images were obtained from the base of the skull through the vertex without contrast.  Comparison: None.  Findings: There is a right frontal subdural hematoma with acute and chronic components, measure up to 13 mm in thickness.  This results in mild mass effect upon the right cerebral hemisphere but, only to a degree of underlying atrophy, there is no significant midline shift.  No no subfalcine herniation.  No hydrocephalus. There is a fluid level in the sphenoid sinus. Atherosclerotic and physiologic intracranial calcifications.  Patchy areas of hypoattenuation in deep and periventricular white matter bilaterally.  Mild atrophy with resultant prominence of ventricles and sulci.  Acute infarct may be inapparent on noncontrast CT.  IMPRESSION:  1. Acute on chronic right frontal subdural hematoma, 13 mm thickness, with mild mass effect, no midline shift or hydrocephalus. 2.  Atrophy with nonspecific white matter changes as above. I telephoned the critical test results to Dr. Toniann Fail at the time of interpretation.   Original Report Authenticated By: D. Andria Rhein, MD     Medications: Scheduled Meds:    .  amLODipine  10 mg Oral Daily  . atorvastatin  40 mg Oral QPM  . carvedilol  25 mg Oral BID WC  . cloNIDine  0.2 mg Transdermal Weekly  . insulin aspart  0-15 Units Subcutaneous TID WC  . insulin glargine  10 Units Subcutaneous Daily  . levofloxacin (LEVAQUIN) IV  250 mg Intravenous Q48H  .  linagliptin  5 mg Oral Daily  . oseltamivir  75 mg Oral Daily  . sodium chloride  3 mL Intravenous Q12H   Continuous Infusions:  PRN Meds:.acetaminophen, acetaminophen, hydrALAZINE, ondansetron (ZOFRAN) IV, ondansetron    LOS: 2 days   Lanise Mergen,CHRISTOPHER  [facility] Pager [phone]. If 8PM-8AM, please contact night-coverage at [email or web address], password [facility] 06/22/2012, 5:06 PM  LOS: 2 days

## 2012-06-22 NOTE — Evaluation (Signed)
Physical Therapy Evaluation Patient Details Name: April [name] MRN: [id number] DOB: [birth year] Today's Date: 06/22/2012 Time: 4098-1191 PT Time Calculation (min): 15 min  PT Assessment / Plan / Recommendation Clinical Impression  Pt adm with confusion and found to have PNA and encephalopathy.  Pt with good mobility and should be able to return to son's home when medically ready.    PT Assessment  Patient needs continued PT services    Follow Up Recommendations  No PT follow up    Does the patient have the potential to tolerate intense rehabilitation      Barriers to Discharge        Equipment Recommendations  None recommended by PT    Recommendations for Other Services     Frequency Min 3X/week    Precautions / Restrictions Precautions Precautions: Fall   Pertinent Vitals/Pain N/A      Mobility  Transfers Transfers: Sit to Stand;Stand to Sit Sit to Stand: 5: Supervision;With upper extremity assist;With armrests;From chair/3-in-1 Stand to Sit: 5: Supervision;With upper extremity assist;With armrests;To chair/3-in-1 Ambulation/Gait Ambulation/Gait Assistance: 5: Supervision Ambulation Distance (Feet): 500 Feet Assistive device: None Ambulation/Gait Assistance Details: occasional stagger but pt able to self correct without physical assist. Gait Pattern: Narrow base of support Gait velocity: 2.23 ft/sec General Gait Details: Instructed pt/son that pt could amb with son in hallways.    Shoulder Instructions     Exercises     PT Diagnosis: Abnormality of gait  PT Problem List: Decreased mobility;Decreased balance PT Treatment Interventions: Gait training;Functional mobility training;Balance training;Therapeutic activities;Therapeutic exercise;Patient/family education   PT Goals Acute Rehab PT Goals PT Goal Formulation: With patient Time For Goal Achievement: 06/29/12 Potential to Achieve Goals: Good Pt will go Sit to Stand: with modified independence PT Goal:  Sit to Stand - Progress: Goal set today Pt will go Stand to Sit: with modified independence PT Goal: Stand to Sit - Progress: Goal set today Pt will Ambulate: >150 feet;with modified independence PT Goal: Ambulate - Progress: Goal set today  Visit Information  Last PT Received On: 06/22/12 Assistance Needed: +1    Subjective Data  Subjective: Pt states she does pretty well at home. Patient Stated Goal: Return to son's home.   Prior Functioning  Home Living Lives With: Son Available Help at Discharge: Family;Available PRN/intermittently Type of Home: House Home Access: Stairs to enter [employer] of Steps: 3-4 Entrance Stairs-Rails: Right Home Layout: One level Home Adaptive Equipment: None Prior Function Level of Independence: Independent Able to Take Stairs?: Yes Vocation: Retired Musician: No difficulties    Cognition  Overall Cognitive Status: Appears within functional limits for tasks assessed/performed Arousal/Alertness: Awake/alert Orientation Level: Appears intact for tasks assessed Behavior During Session: [facility] for tasks performed    Extremity/Trunk Assessment Right Lower Extremity Assessment RLE ROM/Strength/Tone: [facility] for tasks assessed Left Lower Extremity Assessment LLE ROM/Strength/Tone: [facility] for tasks assessed   Balance Static Standing Balance Static Standing - Balance Support: No upper extremity supported Static Standing - Level of Assistance: 6: Modified independent (Device/Increase time)  End of Session PT - End of Session Activity Tolerance: Patient tolerated treatment well Patient left: in chair;with call bell/phone within reach;with family/visitor present  GP     [facility] 06/22/2012, 2:54 PM  [employer] PT [phone]

## 2012-06-22 NOTE — Progress Notes (Signed)
1/17  Spoke with patient about her diabetes. Has had diabetes for about 25 years.  Son brought her down from [place] because her blood sugars had been running high.  Patient had not been taking her insulin for quite some time.  HgbA1C was 16.1% on 06/21/12.  Therefore, that explains the high A1C.  Will have a PCP at discharge and will be living with son.  Son has Type 2 diabetes and on oral agents. Will have a dietician to talk to patient.  Will continue to follow while in hospital.

## 2012-06-23 LAB — BASIC METABOLIC PANEL
CO2: 19 mEq/L (ref 19–32)
Calcium: 8.5 mg/dL (ref 8.4–10.5)
Creatinine, Ser: 1.46 mg/dL — ABNORMAL HIGH (ref 0.50–1.10)
GFR calc Af Amer: 36 mL/min — ABNORMAL LOW (ref >90)
GFR calc non Af Amer: 31 mL/min — ABNORMAL LOW (ref >90)
Sodium: 137 mEq/L (ref 135–145)

## 2012-06-23 LAB — GLUCOSE, CAPILLARY
Glucose-Capillary: 212 mg/dL — ABNORMAL HIGH (ref 70–99)
Glucose-Capillary: 164 mg/dL — ABNORMAL HIGH (ref 70–99)

## 2012-06-23 MED ORDER — HYDRALAZINE HCL 25 MG PO TABS
25.0000 mg | ORAL_TABLET | Freq: Three times a day (TID) | ORAL | Status: DC
  Administered 2012-06-23 – 2012-06-24 (×3): 25 mg via ORAL
  Filled 2012-06-23 (×5): qty 1

## 2012-06-23 MED ORDER — HYDRALAZINE HCL 25 MG PO TABS: 25.0000 mg | ORAL_TABLET | Freq: Two times a day (BID) | ORAL | Status: DC

## 2012-06-23 MED ORDER — INSULIN GLARGINE 100 UNIT/ML ~~LOC~~ SOLN
15.0000 [IU] | Freq: Every day | SUBCUTANEOUS | Status: DC
  Administered 2012-06-24: 15 [IU] via SUBCUTANEOUS

## 2012-06-23 MED ORDER — LEVOFLOXACIN 250 MG PO TABS
250.0000 mg | ORAL_TABLET | ORAL | Status: DC
  Administered 2012-06-24: 250 mg via ORAL
  Filled 2012-06-23 (×2): qty 1

## 2012-06-23 MED ORDER — LOPERAMIDE HCL 2 MG PO CAPS
2.0000 mg | ORAL_CAPSULE | Freq: Two times a day (BID) | ORAL | Status: DC
  Administered 2012-06-23 – 2012-06-24 (×3): 2 mg via ORAL
  Filled 2012-06-23 (×4): qty 1

## 2012-06-23 NOTE — Progress Notes (Signed)
PHARMACIST - PHYSICIAN COMMUNICATION DR:   Brien Few and colleagues CONCERNING: Antibiotic IV to Oral Route Change Policy  RECOMMENDATION: This patient is receiving Levaquin by the intravenous route.  Based on criteria approved by the Pharmacy and Therapeutics Committee, the antibiotic(s) is/are being converted to the equivalent oral dose form(s).   DESCRIPTION: These criteria include:  Patient being treated for a respiratory tract infection, urinary tract infection, or cellulitis  The patient is not neutropenic and does not exhibit a GI malabsorption state  The patient is eating (either orally or via tube) and/or has been taking other orally administered medications for a least 24 hours  The patient is improving clinically and has a Tmax < 100.5  If you have questions about this conversion, please contact the Pharmacy Department  []   [phone] )  [facility] [x]   [phone] )  [facility]  []   [phone] )  [facility] []   [phone] )  [facility]   Will also increase dose to 250mg  po daily with CrCl > 33ml/min.   Thank you,   Christoper Fabian, PharmD, BCPS Clinical pharmacist

## 2012-06-23 NOTE — Progress Notes (Signed)
[facility] PROGRESS NOTE  April [name] NWG:[id number] DOB: Nov 23, 1921 DOA: 06/20/2012 PCP: Default, Provider, MD  Assessment/Plan: Active Problems:  Encephalopathy acute  Community acquired pneumonia  Diabetes mellitus  Hyperlipidemia  Hypertension  SDH (subdural hematoma)     1. Fever/CAP:  Patient presented with a 2 days of confusion, without any clear history of cough, and was found to be febrile, with a temperature of 101.2. CXR revealed new patchy right middle lobe atelectasis, interstitial infiltrate, or scarring. These findings were suggestive of CAP, although wcc was normal. She was commenced on iv Rocephin/Azithromycin, but switched to Levaquin monotherapy on 06/21/12, now day#3. Patient has defervesced and is feeling considerably better. She was initially placed on empiric Tamiflu, due to epidemiologic considerations., but as Flu PCR is negative, this has been discontinued. She continues to improve clinically.  2. Diarrhea: Patient developed diarrhea on 06/20/12, without antecedent history of recent antibiotic therapy or sick contacts. Stool C.Difficile PCR is fortunately negative, and diarrhea appears to have resolved as of 06/22/12. Likely, this was a self-limited viral enteritis. Managed with iv fluids and Loperamide.  3. Dehydration/AKI on CKD: Patient has possible chronic kidney disease. Unfortunately, baseline creatinine unknown at this time. Clinically, she appeared dehydrated at presentation, creatinine was 1.72, BUN 28. Managing with iv fluids. Diuretics/ARB are on hold, and today, creatinine is improved at 1.46. Will discontinue iv fluids today. Following renal indices.  4. Hypokalemia: Secondary to GI loss and diuretic therapy. Repleted as indicated. Magnesium is normal. 5. Acute encephalopathy: Multifactorial and toxic-metabolic, secondary to acute problems outlined above. As of 06/22/12, mental status is back to baseline.    6. SDH: Head CT scan, done to evaluate AMS,  revealed acute on chronic right frontal subdural hematoma, 13 mm thickness, with mild mass effect, no midline shift or hydrocephalus. Patient has no clear history of recent or recurrent falls. Dr Coletta Memos provided neurosurgical consultation and has opined that surgical intervention is not indicated. Avoiding anti-platelet/anticoagulant medication and observing. Stable.  7. Uncontrolled diabetes mellitus type 2: CBG was in the 200s at presentation. Possibly secondary to noncompliance. Managing with diet, oral hypoglycemics, SSI and Lantus, with satisfactory clinical response. [name] is 16.1.  8. Hypertension: Sub-optimally controlled. Managing with Clonidine patch, Norvasc, Coreg and prn Hydralazine. 9. Hyperlipidemia: On statin.  10. Anemia: Mild, normocytic and likely due to chronic disease. closely follow CBC. Stool guaiac is negative. .    Code Status: Full Code. Family Communication:  Disposition Plan: To be determined.   Brief narrative: 79 year-old female with history of diabetes mellitus type 2, hypertension and hyperlipidemia was brought to the ER by patient's son because of abdominal discomfort and diarrhea since 06/20/12 morning. In the ER was found to be febrile and confused. Chest x-ray showed possibility of pneumonia and UA was unremarkable. Patient empirically started on antibiotics for possible pneumonia. Was not on any recent antibiotics. Patient had just recently moved from [place] to patient's son's home. Patient's son is not aware how long she has not been taking her medications. Patient had recently gone to the urgent care clinic and got medication refill and was started on Lantus. She was admitted for further management.    Consultants:  Dr Coletta Memos, neurosurgeon.  Procedures:  CXR.  Head CT scan.   Antibiotics:  Azithromycin 06/20/12-06/21/12.  Rocephin 06/20/12-06/21/12  Tamiflu 06/20/12-06/22/12.   Levaquin 06/21/12>>>  HPI/Subjective: Asymptomatic.    Objective: Vital signs in last 24 hours: Temp:  [97.7 F (36.5 C)-98.6 F (37 C)] 98.1  F (36.7 C) (01/18 0610) Pulse Rate:  [79-88] 88  (01/18 0610) Resp:  [18-20] 18  (01/18 0610) BP: (145-183)/(69-84) 177/84 mmHg (01/18 0706) SpO2:  [99 %-100 %] 100 % (01/18 0610) Weight change:  Last BM Date: 06/22/12  Intake/Output from previous day:       Physical Exam: General: Comfortable, alert, communicative, oriented, not short of breath at rest.  HEENT:  Mild clinical pallor, no jaundice, no conjunctival injection or discharge. Hydration is fair. NECK:  Supple, JVP not seen, no carotid bruits, no palpable lymphadenopathy, no palpable goiter. CHEST:  Clinically clear to auscultation, no wheezes, no crackles. HEART:  Sounds 1 and 2 heard, normal, regular, no murmurs. ABDOMEN:  Full, soft, non-tender, no palpable organomegaly, no palpable masses, normal bowel sounds. GENITALIA:  Not examined. LOWER EXTREMITIES:  No pitting edema, palpable peripheral pulses. MUSCULOSKELETAL SYSTEM:  Generalized osteoarthritic changes, otherwise, normal. CENTRAL NERVOUS SYSTEM:  No focal neurologic deficit on gross examination.  Lab Results:  Basename 06/22/12 0600 06/21/12 0242  WBC 5.2 7.7  HGB 10.6* 10.9*  HCT 32.5* 31.8*  PLT 227 235    Basename 06/23/12 0645 06/22/12 0600  NA 137 137  K 3.6 2.9*  CL 106 104  CO2 19 23  GLUCOSE 165* 101*  BUN 19 20  CREATININE 1.46* 1.54*  CALCIUM 8.5 8.5   Recent Results (from the past 240 hour(s))  CLOSTRIDIUM DIFFICILE BY PCR     Status: Normal   Collection Time   06/21/12  1:02 AM      Component Value Range Status Comment   C difficile by pcr NEGATIVE  NEGATIVE Final      Studies/Results: No results found.  Medications: Scheduled Meds:    . amLODipine  10 mg Oral Daily  . atorvastatin  40 mg Oral QPM  . carvedilol  25 mg Oral BID WC  . cloNIDine  0.3 mg Transdermal Weekly  . insulin aspart  0-15 Units Subcutaneous TID WC  .  insulin glargine  10 Units Subcutaneous Daily  . levofloxacin (LEVAQUIN) IV  250 mg Intravenous Q48H  . linagliptin  5 mg Oral Daily  . oseltamivir  75 mg Oral Daily  . sodium chloride  3 mL Intravenous Q12H   Continuous Infusions:    . sodium chloride 75 mL/hr at 06/22/12 2124   PRN Meds:.acetaminophen, acetaminophen, hydrALAZINE, ondansetron (ZOFRAN) IV, ondansetron    LOS: 3 days   Rosea Dory,CHRISTOPHER  [facility] Pager [phone]. If 8PM-8AM, please contact night-coverage at [email or web address], password [facility] 06/23/2012, 12:49 PM  LOS: 3 days

## 2012-06-23 NOTE — Progress Notes (Signed)
Patient's IV infiltrated. This RN assessed patient for new site with no success. IV team notified. IV team attempted unsuccessfully 3 times. Patient does receive PRN hydralazine, but pressures have been better since starting TID PO hydralazine. [facility] on call was notified that patient is without IV. Will continue to monitor.

## 2012-06-23 NOTE — Discharge Summary (Signed)
Physician Discharge Summary  April [name] WUJ:[id number] DOB: [birth year] DOA: 06/20/2012  PCP: Default, Provider, MD  Admit date: 06/20/2012 Discharge date: 06/24/2012  Time spent: 40 minutes  Recommendations for Outpatient Follow-up:  1. Follow up with primary MD.  Discharge Diagnoses:  Active Problems:  Encephalopathy acute  Community acquired pneumonia  Diabetes mellitus  Hyperlipidemia  Hypertension  SDH (subdural hematoma)   Discharge Condition: Satisfactory.  Diet recommendation: Heart-Healthy/Carbohydrate-Modified.   Filed Weights   06/21/12 0500 06/22/12 0600 06/24/12 0500  Weight: 61.3 kg (135 lb 2.3 oz) 62.3 kg (137 lb 5.6 oz) 62.3 kg (137 lb 5.6 oz)    History of present illness:  79 year-old female with history of diabetes mellitus type 2, hypertension and hyperlipidemia was brought to the ER by patient's son because of abdominal discomfort and diarrhea since 06/20/12 morning. In the ER was found to be febrile and confused. Chest x-ray showed possibility of pneumonia and UA was unremarkable. Patient empirically started on antibiotics for possible pneumonia. Was not on any recent antibiotics. Patient had just recently moved from [place] to patient's son's home. Patient's son is not aware how long she has not been taking her medications. Patient had recently gone to the urgent care clinic and got medication refill and was started on Lantus. She was admitted for further management.      Hospital Course:  1. Fever/CAP: Patient presented with a 2 days of confusion, without any clear history of cough, and was found to be febrile, with a temperature of 101.2. CXR revealed new patchy right middle lobe atelectasis, interstitial infiltrate, or scarring. These findings were suggestive of CAP, although wcc was normal. She was commenced on iv Rocephin/Azithromycin, but switched to Levaquin monotherapy on 06/21/12. Clinical response was satisfactory. Patient defervesced and felt  considerably better. She was initially placed on empiric Tamiflu, due to epidemiologic considerations, but as Flu PCR is negative, was been discontinued. As of 06/24/12, she was asymptomatic. She has been transitioned to oral Levaquin, to be completed on 06/26/12.  2. Diarrhea: Patient developed diarrhea on 06/20/12, without antecedent history of recent antibiotic therapy or sick contacts. Stool C.Difficile PCR was fortunately negative, and diarrhea appears to have resolved as of 06/22/12. Likely, this was a self-limited viral enteritis. Managed with iv fluids and Loperamide.  3. Dehydration/AKI on CKD: Patient has possible chronic kidney disease. Unfortunately, baseline creatinine unknown at this time. Clinically, she appeared dehydrated at presentation, creatinine was 1.72, BUN 28. Managed with iv fluids, diuretics/ARB were placed on hold, and as of 06/23/12, creatinine had improved at 1.46. IV fluids were discontinued on that date, without deleterious effect and creatinine is now 1.43. We suspect this is at or close to baseline.  Diuretics have been discontinued entirely.  4. Hypokalemia: Secondary to GI loss and diuretic therapy. Repleted as indicated. Magnesium was normal.  5. Acute encephalopathy: Multifactorial and toxic-metabolic, secondary to acute problems outlined above. As of 06/22/12, mental status was back to baseline.  6. SDH: Head CT scan, done to evaluate AMS, revealed acute on chronic right frontal subdural hematoma, 13 mm thickness, with mild mass effect, no midline shift or hydrocephalus. Patient has no clear history of recent or recurrent falls. Dr Coletta Memos provided neurosurgical consultation and has opined that surgical intervention is not indicated. Avoiding anti-platelet/anticoagulant medication. Stable.  7. Uncontrolled diabetes mellitus type 2: CBG was in the 200s at presentation, possibly secondary to noncompliance. Managed with diet, oral hypoglycemics, SSI and Lantus, and CBGs were  reasonably controlled, during this  hospitalization. HBA1C was 16.1. Home Lantus dose has been increased to 15 units at bedtime.  8. Hypertension: Sub-optimally controlled at presentation. Managed with Clonidine patch, Norvasc, Coreg and Hydralazine, with improvement. ARB was restarted on discharge, and Hydralazine discontinued. Clonidine patch has been increased to 0.3 mg/week.  9. Hyperlipidemia: On statin.  10. Anemia: Mild, normocytic and likely due to chronic disease. Stool guaiac was negative.    Procedures:  See Below.   Consultations: Dr Coletta Memos, neurosurgeon.   Discharge Exam: Filed Vitals:   06/24/12 0500 06/24/12 0549 06/24/12 0957 06/24/12 1015  BP:  118/64 143/64 145/61  Pulse:   83 81  Temp:  98.2 F (36.8 C) 97.8 F (36.6 C) 98 F (36.7 C)  TempSrc:  Oral Oral Oral  Resp:  20 20 20   Height:      Weight: 62.3 kg (137 lb 5.6 oz)     SpO2:   99% 100%    General: Comfortable, alert, communicative, oriented, not short of breath at rest.  HEENT: Mild clinical pallor, no jaundice, no conjunctival injection or discharge. Hydration is fair.  NECK: Supple, JVP not seen, no carotid bruits, no palpable lymphadenopathy, no palpable goiter.  CHEST: Clinically clear to auscultation, no wheezes, no crackles.  HEART: Sounds 1 and 2 heard, normal, regular, no murmurs.  ABDOMEN: Full, soft, non-tender, no palpable organomegaly, no palpable masses, normal bowel sounds.  GENITALIA: Not examined.  LOWER EXTREMITIES: No pitting edema, palpable peripheral pulses.  MUSCULOSKELETAL SYSTEM: Generalized osteoarthritic changes, otherwise, normal.  CENTRAL NERVOUS SYSTEM: No focal neurologic deficit on gross examination.     Discharge Instructions      Discharge Orders    Future Orders Please Complete By Expires   Diet - low sodium heart healthy      Diet Carb Modified      Increase activity slowly          Medication List     As of 06/24/2012 10:30 AM    STOP taking  these medications         aspirin EC 81 MG tablet      cloNIDine 0.2 mg/24hr patch   Commonly known as: CATAPRES - Dosed in mg/24 hr   Replaced by: cloNIDine 0.3 mg/24hr      hydrochlorothiazide 25 MG tablet   Commonly known as: HYDRODIURIL      TAKE these medications         amLODipine 10 MG tablet   Commonly known as: NORVASC   Take 1 tablet (10 mg total) by mouth daily.      atorvastatin 40 MG tablet   Commonly known as: LIPITOR   Take 40 mg by mouth every evening.      carvedilol 25 MG tablet   Commonly known as: COREG   Take 1 tablet (25 mg total) by mouth 2 (two) times daily with a meal.      cloNIDine 0.3 mg/24hr   Commonly known as: CATAPRES - Dosed in mg/24 hr   Place 1 patch (0.3 mg total) onto the skin once a week.      insulin glargine 100 UNIT/ML injection   Commonly known as: LANTUS   Inject 15 Units into the skin daily.      levofloxacin 250 MG tablet   Commonly known as: LEVAQUIN   Take 1 tablet (250 mg total) by mouth daily.      sitaGLIPtin 50 MG tablet   Commonly known as: JANUVIA   Take 1 tablet (50 mg total)  by mouth daily.      telmisartan 80 MG tablet   Commonly known as: MICARDIS   Take 1 tablet (80 mg total) by mouth daily.         Follow-up Information    Please follow up. (Follow up with primary MD. )           The results of significant diagnostics from this hospitalization (including imaging, microbiology, ancillary and laboratory) are listed below for reference.    Significant Diagnostic Studies: Dg Chest 2 View  06/20/2012  *RADIOLOGY REPORT*  Clinical Data: Fever, diabetes.  CHEST - 2 VIEW  Comparison:  10/30/2004  Findings: Mild cardiomegaly.  Atheromatous aorta.  No effusion. Minimal spurring in the lower thoracic spine. Patchy coarse right middle lobe subsegmental atelectasis, scarring, or interstitial infiltrate, best seen on the lateral projection.  IMPRESSION:  1.  New patchy right middle lobe atelectasis ,  interstitial infiltrate, or scarring.   Original Report Authenticated By: D. Andria Rhein, MD    Ct Head Wo Contrast  06/20/2012  *RADIOLOGY REPORT*  Clinical Data: Altered mental status, confusion  CT HEAD WITHOUT CONTRAST  Technique:  Contiguous axial images were obtained from the base of the skull through the vertex without contrast.  Comparison: None.  Findings: There is a right frontal subdural hematoma with acute and chronic components, measure up to 13 mm in thickness.  This results in mild mass effect upon the right cerebral hemisphere but, only to a degree of underlying atrophy, there is no significant midline shift.  No no subfalcine herniation.  No hydrocephalus. There is a fluid level in the sphenoid sinus. Atherosclerotic and physiologic intracranial calcifications.  Patchy areas of hypoattenuation in deep and periventricular white matter bilaterally.  Mild atrophy with resultant prominence of ventricles and sulci.  Acute infarct may be inapparent on noncontrast CT.  IMPRESSION:  1. Acute on chronic right frontal subdural hematoma, 13 mm thickness, with mild mass effect, no midline shift or hydrocephalus. 2.  Atrophy with nonspecific white matter changes as above. I telephoned the critical test results to Dr. Toniann Fail at the time of interpretation.   Original Report Authenticated By: D. Andria Rhein, MD     Microbiology: Recent Results (from the past 240 hour(s))  CLOSTRIDIUM DIFFICILE BY PCR     Status: Normal   Collection Time   06/21/12  1:02 AM      Component Value Range Status Comment   C difficile by pcr NEGATIVE  NEGATIVE Final      Labs: Basic Metabolic Panel:  Lab 06/24/12 6578 06/23/12 0645 06/22/12 0600 06/21/12 0242 06/20/12 2330 06/20/12 1548  NA 136 137 137 136 -- 135  K 3.5 3.6 2.9* 3.0* -- 2.8*  CL 105 106 104 99 -- 96  CO2 20 19 23 24  -- 27  GLUCOSE 139* 165* 101* 202* -- 312*  BUN 17 19 20  25* -- 28*  CREATININE 1.43* 1.46* 1.54* 1.64* 1.67* --  CALCIUM 8.7 8.5  8.5 9.0 -- 9.4  MG -- -- -- -- 1.7 --  PHOS -- -- -- -- -- --   Liver Function Tests:  Lab 06/21/12 0242 06/20/12 1548 06/19/12 1310  AST 17 17 16   ALT 9 10 11   ALKPHOS 92 100 117  BILITOT 0.4 0.4 0.3  PROT 6.2 6.5 6.7  ALBUMIN 2.3* 2.5* 2.7*   No results found for this basename: LIPASE:5,AMYLASE:5 in the last 168 hours  Lab 06/20/12 2335  AMMONIA 47   CBC:  Lab 06/24/12  0720 06/22/12 0600 06/21/12 0242 06/20/12 2330 06/20/12 1548  WBC 6.2 5.2 7.7 8.0 8.0  NEUTROABS -- -- 5.1 -- 6.0  HGB 10.8* 10.6* 10.9* 11.3* 10.8*  HCT 32.2* 32.5* 31.8* 33.0* 32.0*  MCV 78.7 79.3 78.5 78.6 78.6  PLT 259 227 235 244 239   Cardiac Enzymes: No results found for this basename: CKTOTAL:5,CKMB:5,CKMBINDEX:5,TROPONINI:5 in the last 168 hours BNP: BNP (last 3 results) No results found for this basename: PROBNP:3 in the last 8760 hours CBG:  Lab 06/23/12 2238 06/23/12 1748 06/23/12 1131 06/23/12 0645 06/22/12 1639  GLUCAP 133* 164* 212* 127* 207*       Signed:  Ardythe Klute,CHRISTOPHER  [facility] 06/24/2012, 10:30 AM

## 2012-06-23 NOTE — Plan of Care (Signed)
Problem: Food- and Nutrition-Related Knowledge Deficit (NB-1.1) Goal: Nutrition education Formal process to instruct or train a patient/client in a skill or to impart knowledge to help patients/clients voluntarily manage or modify food choices and eating behavior to maintain or improve health.  Outcome: Completed/Met Date Met:  06/23/12  RD consulted for nutrition education regarding diabetes.     Lab Results  Component Value Date    HGBA1C 16.1* 06/21/2012    RD provided "My Diet Plan" handout. Discussed different food groups and their effects on blood sugar, emphasizing carbohydrate-containing foods. Provided list of carbohydrates and recommended serving sizes of common foods.  Discussed importance of controlled and consistent carbohydrate intake throughout the day. Provided examples of ways to balance meals/snacks and encouraged intake of high-fiber, whole grain complex carbohydrates. Teach back method used.  Expect good compliance.  Body mass index is 26.82 kg/(m^2). Pt meets criteria for overweight based on current BMI.  Current diet order is Carbohydrate modified, patient is consuming approximately 75% of meals at this time. Labs and medications reviewed. No further nutrition interventions warranted at this time. RD contact information provided. If additional nutrition issues arise, please re-consult RD.  Linnell Fulling, RD, LDN Pager #: [phone] After-Hours Pager #: [phone]

## 2012-06-23 NOTE — Evaluation (Signed)
Occupational Therapy Evaluation Patient Details Name: April [name] MRN: [id number] DOB: 1921/09/27 Today's Date: 06/23/2012 Time: 4098-1191 OT Time Calculation (min): 35 min  OT Assessment / Plan / Recommendation Clinical Impression  79 yo female admitted with PNA, Encephalopathy acute, and small right frontal SDH . Pt does not require acute Ot at this time. Ot to sign off.     OT Assessment  Patient does not need any further OT services    Follow Up Recommendations  No OT follow up    Barriers to Discharge      Equipment Recommendations  None recommended by OT    Recommendations for Other Services    Frequency       Precautions / Restrictions Precautions Precautions: Fall   Pertinent Vitals/Pain No pain Tolerated full ADL     ADL  Eating/Feeding: Independent Where Assessed - Eating/Feeding: Chair Grooming: Wash/dry hands;Wash/dry face;Teeth care;Modified independent Where Assessed - Grooming: Unsupported standing Upper Body Bathing: Chest;Right arm;Left arm;Abdomen;Modified independent Where Assessed - Upper Body Bathing: Unsupported standing Lower Body Bathing: Modified independent Where Assessed - Lower Body Bathing: Unsupported standing Upper Body Dressing: Modified independent Where Assessed - Upper Body Dressing: Unsupported standing Toilet Transfer: Modified independent Toilet Transfer Method: Sit to stand Toilet Transfer Equipment: Regular height toilet Toileting - Clothing Manipulation and Hygiene: Modified independent Where Assessed - Toileting Clothing Manipulation and Hygiene: Sit to stand from 3-in-1 or toilet Equipment Used: Gait belt Transfers/Ambulation Related to ADLs: Pt ambulating at mod I level. pt completed turn in > 4 seconds in 3 steps. pt able to stop immediately without balance deficits noted. pt reports feeling back to baseline. pt does not require acute Ot at this time.  ADL Comments: Pt completed adl at sink and able to touch toes standing  static . Pt demonstrates dynamic standing task with bathing.     OT Diagnosis:    OT Problem List:   OT Treatment Interventions:     OT Goals    Visit Information  Last OT Received On: 06/23/12 Assistance Needed: +1    Subjective Data  Subjective: "i have been here maybe a week" Patient Stated Goal: to return home to son's house   Prior Functioning     Home Living Lives With: Son Available Help at Discharge: Family;Available PRN/intermittently Type of Home: House Home Access: Stairs to enter [employer] of Steps: 3-4 Entrance Stairs-Rails: Right Home Layout: One level Bathroom Shower/Tub: Forensic scientist: Standard Home Adaptive Equipment: None Prior Function Level of Independence: Independent Able to Take Stairs?: Yes Driving: No Vocation: Retired Musician: No difficulties Dominant Hand: Right         Vision/Perception     Cognition  Overall Cognitive Status: Appears within functional limits for tasks assessed/performed Arousal/Alertness: Awake/alert Orientation Level: Appears intact for tasks assessed Behavior During Session: [facility] for tasks performed    Extremity/Trunk Assessment Right Upper Extremity Assessment RUE ROM/Strength/Tone: Within functional levels (noted to have enlarged 2nd digit nail ) RUE Sensation: WFL - Light Touch RUE Coordination: WFL - gross/fine motor Left Upper Extremity Assessment LUE ROM/Strength/Tone: WFL for tasks assessed LUE ROM/Strength/Tone Deficits: pt reports Lt hand feeling slightly "tight" , grasp is  symmetrical with testing LUE Sensation: WFL - Light Touch LUE Coordination: WFL - gross/fine motor Trunk Assessment Trunk Assessment: Normal     Mobility Bed Mobility Bed Mobility: Not assessed Transfers Sit to Stand: 6: Modified independent (Device/Increase time);From chair/3-in-1 Stand to Sit: 6: Modified independent (Device/Increase time);To  chair/3-in-1 Details for Transfer Assistance:  WFL for d/c     Shoulder Instructions     Exercise     Balance     End of Session OT - End of Session Activity Tolerance: Patient tolerated treatment well Patient left: in chair;with call bell/phone within reach Nurse Communication: Mobility status;Precautions  GO     Lucile Shutters 06/23/2012, 8:42 AM Pager: [phone]

## 2012-06-24 LAB — BASIC METABOLIC PANEL
Calcium: 8.7 mg/dL (ref 8.4–10.5)
GFR calc Af Amer: 37 mL/min — ABNORMAL LOW (ref >90)
GFR calc non Af Amer: 32 mL/min — ABNORMAL LOW (ref >90)
Glucose, Bld: 139 mg/dL — ABNORMAL HIGH (ref 70–99)
Potassium: 3.5 mEq/L (ref 3.5–5.1)
Sodium: 136 mEq/L (ref 135–145)

## 2012-06-24 LAB — CBC
Hemoglobin: 10.8 g/dL — ABNORMAL LOW (ref 12.0–15.0)
MCHC: 33.5 g/dL (ref 30.0–36.0)
Platelets: 259 10*3/uL (ref 150–400)

## 2012-06-24 LAB — GLUCOSE, CAPILLARY: Glucose-Capillary: 185 mg/dL — ABNORMAL HIGH (ref 70–99)

## 2012-06-24 MED ORDER — CLONIDINE HCL 0.3 MG/24HR TD PTWK: 1.0000 | MEDICATED_PATCH | TRANSDERMAL | Status: DC

## 2012-06-24 MED ORDER — LEVOFLOXACIN 250 MG PO TABS: 250.0000 mg | ORAL_TABLET | Freq: Every day | ORAL | Status: DC

## 2012-06-24 MED ORDER — INSULIN GLARGINE 100 UNIT/ML ~~LOC~~ SOLN: 15.0000 [IU] | Freq: Every day | SUBCUTANEOUS | Status: DC

## 2012-06-24 NOTE — Progress Notes (Signed)
Pt discharged home to live with son [name] who is also diabetic. Discharge education including Diabetes provided. Son  will provide and administer mother insulin  Pen. States no need to learn how to draw from vial. All questions answered.

## 2012-06-24 NOTE — Progress Notes (Signed)
Pt provided printed Diabetes Education materiel. Unable to give self injection on assessment when RN told her to show how she gets insulin injection at home.  Pt exhibits no evidence of learning when demonstrating SQ insulin injection. Needs reinforcement.

## 2012-07-02 ENCOUNTER — Encounter (HOSPITAL_COMMUNITY): Payer: Self-pay | Admitting: Emergency Medicine

## 2012-07-02 ENCOUNTER — Encounter (HOSPITAL_COMMUNITY): Payer: Self-pay | Admitting: Adult Health

## 2012-07-02 ENCOUNTER — Emergency Department (INDEPENDENT_AMBULATORY_CARE_PROVIDER_SITE_OTHER)
Admission: EM | Admit: 2012-07-02 | Discharge: 2012-07-02 | Disposition: A | Discharge status: Nursing Home (Includes ICF) | Payer: Medicare Other | Source: Home / Self Care | Attending: Family Medicine | Admitting: Family Medicine

## 2012-07-02 ENCOUNTER — Inpatient Hospital Stay (HOSPITAL_COMMUNITY)
Admission: EM | Admit: 2012-07-02 | Discharge: 2012-07-05 | DRG: 253 | Disposition: A | Discharge status: Home / Self Care | Payer: Medicare Other | Attending: Internal Medicine | Admitting: Internal Medicine

## 2012-07-02 ENCOUNTER — Emergency Department (HOSPITAL_COMMUNITY): Payer: Medicare Other

## 2012-07-02 DIAGNOSIS — R600 Localized edema: Secondary | ICD-10-CM

## 2012-07-02 DIAGNOSIS — E119 Type 2 diabetes mellitus without complications: Secondary | ICD-10-CM | POA: Diagnosis present

## 2012-07-02 DIAGNOSIS — E785 Hyperlipidemia, unspecified: Secondary | ICD-10-CM

## 2012-07-02 DIAGNOSIS — J189 Pneumonia, unspecified organism: Secondary | ICD-10-CM

## 2012-07-02 DIAGNOSIS — D649 Anemia, unspecified: Secondary | ICD-10-CM

## 2012-07-02 DIAGNOSIS — S065X9A Traumatic subdural hemorrhage with loss of consciousness of unspecified duration, initial encounter: Secondary | ICD-10-CM

## 2012-07-02 DIAGNOSIS — N183 Chronic kidney disease, stage 3 unspecified: Secondary | ICD-10-CM

## 2012-07-02 DIAGNOSIS — I129 Hypertensive chronic kidney disease with stage 1 through stage 4 chronic kidney disease, or unspecified chronic kidney disease: Secondary | ICD-10-CM | POA: Diagnosis present

## 2012-07-02 DIAGNOSIS — S065XAA Traumatic subdural hemorrhage with loss of consciousness status unknown, initial encounter: Secondary | ICD-10-CM

## 2012-07-02 DIAGNOSIS — I10 Essential (primary) hypertension: Secondary | ICD-10-CM

## 2012-07-02 DIAGNOSIS — R609 Edema, unspecified: Secondary | ICD-10-CM

## 2012-07-02 DIAGNOSIS — I824Z9 Acute embolism and thrombosis of unspecified deep veins of unspecified distal lower extremity: Principal | ICD-10-CM | POA: Diagnosis present

## 2012-07-02 DIAGNOSIS — G9340 Encephalopathy, unspecified: Secondary | ICD-10-CM

## 2012-07-02 DIAGNOSIS — I82409 Acute embolism and thrombosis of unspecified deep veins of unspecified lower extremity: Secondary | ICD-10-CM

## 2012-07-02 DIAGNOSIS — N179 Acute kidney failure, unspecified: Secondary | ICD-10-CM

## 2012-07-02 DIAGNOSIS — E1169 Type 2 diabetes mellitus with other specified complication: Secondary | ICD-10-CM | POA: Diagnosis not present

## 2012-07-02 DIAGNOSIS — Z79899 Other long term (current) drug therapy: Secondary | ICD-10-CM

## 2012-07-02 DIAGNOSIS — Z794 Long term (current) use of insulin: Secondary | ICD-10-CM

## 2012-07-02 DIAGNOSIS — N289 Disorder of kidney and ureter, unspecified: Secondary | ICD-10-CM

## 2012-07-02 HISTORY — DX: Pneumonia, unspecified organism: J18.9

## 2012-07-02 HISTORY — DX: Traumatic subdural hemorrhage with loss of consciousness status unknown, initial encounter: S06.5XAA

## 2012-07-02 HISTORY — DX: Unspecified urinary incontinence: R32

## 2012-07-02 HISTORY — DX: Unspecified osteoarthritis, unspecified site: M19.90

## 2012-07-02 HISTORY — DX: Acute embolism and thrombosis of unspecified deep veins of unspecified lower extremity: I82.409

## 2012-07-02 HISTORY — DX: Traumatic subdural hemorrhage with loss of consciousness of unspecified duration, initial encounter: S06.5X9A

## 2012-07-02 LAB — COMPREHENSIVE METABOLIC PANEL
BUN: 45 mg/dL — ABNORMAL HIGH (ref 6–23)
CO2: 24 mEq/L (ref 19–32)
Chloride: 101 mEq/L (ref 96–112)
Creatinine, Ser: 2.06 mg/dL — ABNORMAL HIGH (ref 0.50–1.10)
GFR calc non Af Amer: 20 mL/min — ABNORMAL LOW (ref >90)
Total Bilirubin: 0.1 mg/dL — ABNORMAL LOW (ref 0.3–1.2)

## 2012-07-02 LAB — URINALYSIS, ROUTINE W REFLEX MICROSCOPIC
Leukocytes, UA: NEGATIVE
Protein, ur: >300 mg/dL — AB
Urobilinogen, UA: 0.2 mg/dL (ref 0.0–1.0)

## 2012-07-02 LAB — CBC WITH DIFFERENTIAL/PLATELET
HCT: 26.4 % — ABNORMAL LOW (ref 36.0–46.0)
Hemoglobin: 9 g/dL — ABNORMAL LOW (ref 12.0–15.0)
Lymphocytes Relative: 28 % (ref 12–46)
MCHC: 34.1 g/dL (ref 30.0–36.0)
Monocytes Absolute: 0.6 10*3/uL (ref 0.1–1.0)
Monocytes Relative: 9 % (ref 3–12)
Neutro Abs: 3.9 10*3/uL (ref 1.7–7.7)
WBC: 6.5 10*3/uL (ref 4.0–10.5)

## 2012-07-02 LAB — PRO B NATRIURETIC PEPTIDE: Pro B Natriuretic peptide (BNP): 184.3 pg/mL (ref 0–450)

## 2012-07-02 LAB — GLUCOSE, CAPILLARY: Glucose-Capillary: 237 mg/dL — ABNORMAL HIGH (ref 70–99)

## 2012-07-02 LAB — URINE MICROSCOPIC-ADD ON

## 2012-07-02 NOTE — ED Notes (Signed)
Bilateral leg swelling and blisters, open blisters.  Family member reports patient was discharged from hospital 5 days ago, family member reports micardis was not written for at discharge.

## 2012-07-02 NOTE — ED Provider Notes (Signed)
This chart was scribed for Glynn Octave, MD by Bennett Scrape, ED Scribe. This patient was seen in room A13C/A13C and the patient's care was started at 8:50 PM.  April [name] is a 79 y.o. female who presents to the Emergency Department from [facility] complaining of 2 days of leg swelling with associated abdominal swelling. Son reports that the pt had a boil pop in the inner right thigh 2 days ago. She denies having a h/o heart or lung problems. She was d/o from the hospital 5 days ago and states that she was put on Levaquin for PNA. She states that she is moving her bowels normally. She denies CP, SOB, fevers and emesis as associated symptoms.  PE CV: Regular rate and rhythm  LUNGS: Clear to auscultation ABDOMEN: Soft, non-tender MUSCULOSKELETAL: 4+ bilateral edema to knees with open blisters on legs, no signs of infection, no tenderness  Recent hospitalization for HCAP, now with pitting edema to bilateral LE, some open blisters. Hyperglycemia and worsening renal failure.  I personally performed the services described in this documentation, which was scribed in my presence. The recorded information has been reviewed and is accurate.    Glynn Octave, MD 07/03/12 [phone]

## 2012-07-02 NOTE — ED Notes (Signed)
Sent from [facility] with bilateral leg swelling and Hyperglycemia. Right leg with +2 pitting edema.. Pt recently D/C from hospital 5 days ago. CBG 237. Pt has no complaints. She states her legs have been swelling for one week.

## 2012-07-02 NOTE — ED Notes (Signed)
Patient was sent from [facility] for further eval of swelling to bil lower legs from the thigh down. Patient also has abdominal swelling but son then states she has increased appetite. Patient denies any pain.  Son has been taking care of patient due to decline in her ability to care for herself.  Patient requires assistance with most of her adl.  She is reported to be incont of both urine and stool.  Last bm today.  Patient has hx of diabetes.  Patient with no s/sx of distress.   Patient with crackles noted to the left lower lobe.

## 2012-07-02 NOTE — ED Provider Notes (Signed)
History     CSN: [id number]  Arrival date & time 07/02/12  1804   First MD Initiated Contact with Patient 07/02/12 1820      Chief Complaint  Patient presents with  . Leg Swelling    (Consider location/radiation/quality/duration/timing/severity/associated sxs/prior treatment) Patient is a 79 y.o. female presenting with general illness. The history is provided by the patient and a relative.  Illness  The current episode started 2 days ago. The onset was gradual. The problem has been gradually worsening. The problem is moderate. Associated symptoms comments: Lower ext edema and blistering..    Past Medical History  Diagnosis Date  . Diabetes mellitus   . Hypertension   . Hyperlipidemia   . Pneumonia   . Subdural hematoma     History reviewed. No pertinent past surgical history.  Family History  Problem Relation Age of Onset  . Diabetes Mellitus II Son     History  Substance Use Topics  . Smoking status: Never Smoker   . Smokeless tobacco: Not on file  . Alcohol Use: No    OB History    Grav Para Term Preterm Abortions TAB SAB Ect Mult Living                  Review of Systems  Respiratory: Negative for chest tightness.   Cardiovascular: Positive for leg swelling. Negative for chest pain.    Allergies  Review of patient's allergies indicates no known allergies.  Home Medications   Current Outpatient Rx  Name  Route  Sig  Dispense  Refill  . AMLODIPINE BESYLATE 10 MG PO TABS   Oral   Take 1 tablet (10 mg total) by mouth daily.   30 tablet   1   . ATORVASTATIN CALCIUM 40 MG PO TABS   Oral   Take 40 mg by mouth every evening.         Marland Kitchen CARVEDILOL 25 MG PO TABS   Oral   Take 1 tablet (25 mg total) by mouth 2 (two) times daily with a meal.   60 tablet   3   . CLONIDINE HCL 0.3 MG/24HR TD PTWK   Transdermal   Place 1 patch (0.3 mg total) onto the skin once a week.   4 patch   2   . INSULIN GLARGINE 100 UNIT/ML [employer] SOLN   Subcutaneous  Inject 15 Units into the skin daily.   10 mL   1   . LEVOFLOXACIN 250 MG PO TABS   Oral   Take 1 tablet (250 mg total) by mouth daily.   3 tablet   0   . SITAGLIPTIN PHOSPHATE 50 MG PO TABS   Oral   Take 1 tablet (50 mg total) by mouth daily.   30 tablet   1   . TELMISARTAN 80 MG PO TABS   Oral   Take 1 tablet (80 mg total) by mouth daily.   30 tablet   3     BP 169/73  Pulse 90  Temp 97.6 F (36.4 C) (Oral)  Resp 22  SpO2 97%  Physical Exam  Nursing note and vitals reviewed. Constitutional: She appears well-developed and well-nourished. No distress.  Cardiovascular: Regular rhythm and normal heart sounds.   Pulmonary/Chest: Breath sounds normal.  Musculoskeletal: She exhibits edema.       4+ bilat edema to knees with open blisters on legs, no signs of infection., no pain.  Neurological: She is alert.  Skin: Skin is warm and dry.  ED Course  Procedures (including critical care time)  Labs Reviewed  GLUCOSE, CAPILLARY - Abnormal; Notable for the following:    Glucose-Capillary 276 (*)     All other components within normal limits   No results found.   1. Edema of both legs   2. Diabetes mellitus       MDM          Linna Hoff, MD 07/02/12 [phone]

## 2012-07-02 NOTE — ED Provider Notes (Signed)
History     CSN: [id number]  Arrival date & time 07/02/12  4098   First MD Initiated Contact with Patient 07/02/12 2003      Chief Complaint  Patient presents with  . Leg Swelling    (Consider location/radiation/quality/duration/timing/severity/associated sxs/prior treatment) HPI Comments: Patient with history of diabetes, hypertension, recent admission from 1/15-1/19/14 for AMS and pneumonia -- presents with complaint of new bilateral lower extremity edema and abdominal distention. Son has noted bullae on right thigh and left anterior shin. No fevers, cold symptoms, chest pain, shortness of breath, abdominal pain. No history of kidney or hepatic failure. No history of congestive heart failure. No treatments prior to arrival. Patient has been on clonidine for years and has not been started on any new medications recently. Appetite has been good. Onset gradual. Course is gradually worsening. Nothing makes symptoms better or worse.   The history is provided by the patient and a relative.    Past Medical History  Diagnosis Date  . Diabetes mellitus   . Hypertension   . Hyperlipidemia   . Pneumonia   . Subdural hematoma     History reviewed. No pertinent past surgical history.  Family History  Problem Relation Age of Onset  . Diabetes Mellitus II Son     History  Substance Use Topics  . Smoking status: Never Smoker   . Smokeless tobacco: Not on file  . Alcohol Use: No    OB History    Grav Para Term Preterm Abortions TAB SAB Ect Mult Living                  Review of Systems  Constitutional: Negative for fever.  HENT: Negative for sore throat and rhinorrhea.   Eyes: Negative for redness.  Respiratory: Negative for cough.   Cardiovascular: Positive for leg swelling. Negative for chest pain and palpitations.  Gastrointestinal: Negative for nausea, vomiting, abdominal pain, diarrhea and blood in stool.  Genitourinary: Positive for enuresis. Negative for dysuria and  decreased urine volume.  Musculoskeletal: Negative for myalgias.  Skin: Negative for rash.  Neurological: Negative for syncope, light-headedness and headaches.    Allergies  Review of patient's allergies indicates no known allergies.  Home Medications   Current Outpatient Rx  Name  Route  Sig  Dispense  Refill  . AMLODIPINE BESYLATE 10 MG PO TABS   Oral   Take 1 tablet (10 mg total) by mouth daily.   30 tablet   1   . ATORVASTATIN CALCIUM 40 MG PO TABS   Oral   Take 40 mg by mouth every evening.         Marland Kitchen CARVEDILOL 25 MG PO TABS   Oral   Take 1 tablet (25 mg total) by mouth 2 (two) times daily with a meal.   60 tablet   3   . CLONIDINE HCL 0.3 MG/24HR TD PTWK   Transdermal   Place 1 patch (0.3 mg total) onto the skin once a week.   4 patch   2   . INSULIN GLARGINE 100 UNIT/ML [place] SOLN   Subcutaneous   Inject 15 Units into the skin daily.   10 mL   1   . SITAGLIPTIN PHOSPHATE 50 MG PO TABS   Oral   Take 1 tablet (50 mg total) by mouth daily.   30 tablet   1   . TELMISARTAN 80 MG PO TABS   Oral   Take 1 tablet (80 mg total) by mouth daily.  30 tablet   3     BP 175/88  Pulse 85  Temp 97 F (36.1 C) (Oral)  Resp 18  SpO2 97%  Physical Exam  Nursing note and vitals reviewed. Constitutional: She appears well-developed and well-nourished.  HENT:  Head: Normocephalic and atraumatic.  Eyes: Conjunctivae normal are normal. Right eye exhibits no discharge. Left eye exhibits no discharge.  Neck: Normal range of motion. Neck supple.  Cardiovascular: Normal rate, regular rhythm and normal heart sounds.   Pulmonary/Chest: Effort normal and breath sounds normal.  Abdominal: Soft. There is no tenderness.  Genitourinary: Rectal exam shows no tenderness. Guaiac negative stool.  Musculoskeletal: Normal range of motion. She exhibits edema. She exhibits no tenderness.       1-2+ bilateral, symmetric pitting edema lower extremities to mid-ankles.     Neurological: She is alert.  Skin: Skin is warm and dry.  Psychiatric: She has a normal mood and affect.    ED Course  Procedures (including critical care time)  Labs Reviewed  GLUCOSE, CAPILLARY - Abnormal; Notable for the following:    Glucose-Capillary 237 (*)     All other components within normal limits  CBC WITH DIFFERENTIAL - Abnormal; Notable for the following:    RBC 3.26 (*)     Hemoglobin 9.0 (*)     HCT 26.4 (*)     All other components within normal limits  COMPREHENSIVE METABOLIC PANEL - Abnormal; Notable for the following:    Sodium 134 (*)     Glucose, Bld 172 (*)     BUN 45 (*)     Creatinine, Ser 2.06 (*)     Albumin 2.1 (*)     Alkaline Phosphatase 122 (*)     Total Bilirubin 0.1 (*)     GFR calc non Af Amer 20 (*)     GFR calc Af Amer 24 (*)     All other components within normal limits  URINALYSIS, ROUTINE W REFLEX MICROSCOPIC - Abnormal; Notable for the following:    Glucose, UA 500 (*)     Hgb urine dipstick SMALL (*)     Protein, ur >300 (*)     All other components within normal limits  URINE MICROSCOPIC-ADD ON - Abnormal; Notable for the following:    Casts GRANULAR CAST (*)  RARE   All other components within normal limits  PRO B NATRIURETIC PEPTIDE  OCCULT BLOOD, POC DEVICE   Dg Abd Acute W/chest  07/02/2012  *RADIOLOGY REPORT*  Clinical Data: Leg swelling, abdominal distension  ACUTE ABDOMEN SERIES (ABDOMEN 2 VIEW & CHEST 1 VIEW)  Comparison: Chest radiographs dated 06/20/2012  Findings: Lungs are essentially clear.  Prior right middle lobe opacity is not well visualized on the current study, noting that he was most conspicuous on the prior lateral view.  No frank interstitial edema. No pleural effusion or pneumothorax.  Stable mild cardiomegaly.  No evidence of bowel obstruction. No evidence of free air on the lateral decubitus view.  Moderate colonic stool burden.  Degenerative changes of the visualized thoracolumbar spine.  IMPRESSION: No  evidence of acute cardiopulmonary disease.  Prior right middle lobe opacity is not well visualized on the current study.  No evidence of small bowel obstruction or free air.  Moderate colonic stool burden.   Original Report Authenticated By: Charline Bills, M.D.      1. Lower extremity edema   2. Renal insufficiency   3. Anemia     8:34 PM Patient seen and examined.  Work-up initiated. Medications ordered.   Vital signs reviewed and are as follows: Filed Vitals:   07/02/12 1959  BP: 175/88  Pulse: 85  Temp: 97 F (36.1 C)  Resp: 18    Date: 07/02/2012  Rate: 83  Rhythm: normal sinus rhythm  QRS Axis: normal  Intervals: normal  ST/T Wave abnormalities: normal  Conduction Disutrbances:none  Narrative Interpretation:   Old EKG Reviewed: none available  11:35 PM Patient was seen by Dr. Manus Gunning. Will admit given worsening anemia and renal function.   [facility] to see.     MDM  LE edema, worsening renal fcn, worsening anemia. Admit for further evaluation.         [place], [place] 07/02/12 [id number]

## 2012-07-03 ENCOUNTER — Encounter (HOSPITAL_COMMUNITY): Payer: Self-pay | Admitting: Internal Medicine

## 2012-07-03 DIAGNOSIS — R609 Edema, unspecified: Secondary | ICD-10-CM

## 2012-07-03 DIAGNOSIS — E119 Type 2 diabetes mellitus without complications: Secondary | ICD-10-CM

## 2012-07-03 DIAGNOSIS — I10 Essential (primary) hypertension: Secondary | ICD-10-CM

## 2012-07-03 DIAGNOSIS — N179 Acute kidney failure, unspecified: Secondary | ICD-10-CM | POA: Diagnosis present

## 2012-07-03 DIAGNOSIS — R6889 Other general symptoms and signs: Secondary | ICD-10-CM

## 2012-07-03 DIAGNOSIS — D649 Anemia, unspecified: Secondary | ICD-10-CM

## 2012-07-03 DIAGNOSIS — R600 Localized edema: Secondary | ICD-10-CM

## 2012-07-03 LAB — HEPATIC FUNCTION PANEL
ALT: 10 U/L (ref 0–35)
AST: 17 U/L (ref 0–37)
Albumin: 1.9 g/dL — ABNORMAL LOW (ref 3.5–5.2)
Alkaline Phosphatase: 83 U/L (ref 39–117)
Bilirubin, Direct: <0.1 mg/dL (ref 0.0–0.3)
Total Bilirubin: 0.2 mg/dL — ABNORMAL LOW (ref 0.3–1.2)
Total Protein: 5.5 g/dL — ABNORMAL LOW (ref 6.0–8.3)

## 2012-07-03 LAB — CBC
Hemoglobin: 8.4 g/dL — ABNORMAL LOW (ref 12.0–15.0)
Platelets: 271 10*3/uL (ref 150–400)
RBC: 3.18 MIL/uL — ABNORMAL LOW (ref 3.87–5.11)
WBC: 6.7 10*3/uL (ref 4.0–10.5)

## 2012-07-03 LAB — BASIC METABOLIC PANEL WITH GFR
BUN: 39 mg/dL — ABNORMAL HIGH (ref 6–23)
CO2: 24 meq/L (ref 19–32)
Calcium: 8.7 mg/dL (ref 8.4–10.5)
Chloride: 109 meq/L (ref 96–112)
Creatinine, Ser: 1.8 mg/dL — ABNORMAL HIGH (ref 0.50–1.10)
GFR calc Af Amer: 28 mL/min — ABNORMAL LOW
GFR calc non Af Amer: 24 mL/min — ABNORMAL LOW
Glucose, Bld: 56 mg/dL — ABNORMAL LOW (ref 70–99)
Potassium: 3.2 meq/L — ABNORMAL LOW (ref 3.5–5.1)
Sodium: 142 meq/L (ref 135–145)

## 2012-07-03 LAB — GLUCOSE, CAPILLARY
Glucose-Capillary: 174 mg/dL — ABNORMAL HIGH (ref 70–99)
Glucose-Capillary: 56 mg/dL — ABNORMAL LOW (ref 70–99)
Glucose-Capillary: 87 mg/dL (ref 70–99)
Glucose-Capillary: 89 mg/dL (ref 70–99)

## 2012-07-03 MED ORDER — SODIUM CHLORIDE 0.9 % IJ SOLN
3.0000 mL | Freq: Two times a day (BID) | INTRAMUSCULAR | Status: DC
  Administered 2012-07-03 – 2012-07-04 (×3): 3 mL via INTRAVENOUS

## 2012-07-03 MED ORDER — CLONIDINE HCL 0.3 MG/24HR TD PTWK
0.3000 mg | MEDICATED_PATCH | TRANSDERMAL | Status: DC
  Administered 2012-07-03: 0.3 mg via TRANSDERMAL
  Filled 2012-07-03: qty 1

## 2012-07-03 MED ORDER — ATORVASTATIN CALCIUM 40 MG PO TABS
40.0000 mg | ORAL_TABLET | Freq: Every evening | ORAL | Status: DC
  Administered 2012-07-03 – 2012-07-04 (×2): 40 mg via ORAL
  Filled 2012-07-03 (×3): qty 1

## 2012-07-03 MED ORDER — LINAGLIPTIN 5 MG PO TABS
5.0000 mg | ORAL_TABLET | Freq: Every day | ORAL | Status: DC
  Administered 2012-07-03 – 2012-07-05 (×3): 5 mg via ORAL
  Filled 2012-07-03 (×3): qty 1

## 2012-07-03 MED ORDER — POTASSIUM CHLORIDE CRYS ER 20 MEQ PO TBCR
40.0000 meq | EXTENDED_RELEASE_TABLET | Freq: Two times a day (BID) | ORAL | Status: AC
  Administered 2012-07-03 – 2012-07-04 (×2): 40 meq via ORAL
  Filled 2012-07-03 (×2): qty 2

## 2012-07-03 MED ORDER — ONDANSETRON HCL 4 MG/2ML IJ SOLN: 4.0000 mg | Freq: Three times a day (TID) | INTRAMUSCULAR | Status: DC | PRN

## 2012-07-03 MED ORDER — GLUCOSE 40 % PO GEL: 1.0000 | ORAL | Status: DC | PRN

## 2012-07-03 MED ORDER — HYDRALAZINE HCL 20 MG/ML IJ SOLN
10.0000 mg | INTRAMUSCULAR | Status: DC | PRN
  Filled 2012-07-03: qty 0.5

## 2012-07-03 MED ORDER — HYDRALAZINE HCL 10 MG PO TABS
10.0000 mg | ORAL_TABLET | Freq: Two times a day (BID) | ORAL | Status: DC
  Administered 2012-07-03 – 2012-07-05 (×4): 10 mg via ORAL
  Filled 2012-07-03 (×5): qty 1

## 2012-07-03 MED ORDER — GLUCOSE-VITAMIN C 4-6 GM-MG PO CHEW: 4.0000 | CHEWABLE_TABLET | ORAL | Status: DC | PRN

## 2012-07-03 MED ORDER — ACETAMINOPHEN 325 MG PO TABS
650.0000 mg | ORAL_TABLET | Freq: Four times a day (QID) | ORAL | Status: DC | PRN
  Administered 2012-07-04: 650 mg via ORAL
  Filled 2012-07-03: qty 2

## 2012-07-03 MED ORDER — AMLODIPINE BESYLATE 10 MG PO TABS
10.0000 mg | ORAL_TABLET | Freq: Every day | ORAL | Status: DC
  Administered 2012-07-03 – 2012-07-05 (×3): 10 mg via ORAL
  Filled 2012-07-03 (×3): qty 1

## 2012-07-03 MED ORDER — INSULIN GLARGINE 100 UNIT/ML ~~LOC~~ SOLN
8.0000 [IU] | Freq: Every day | SUBCUTANEOUS | Status: DC
  Administered 2012-07-04 – 2012-07-05 (×2): 8 [IU] via SUBCUTANEOUS

## 2012-07-03 MED ORDER — ONDANSETRON HCL 4 MG/2ML IJ SOLN: 4.0000 mg | Freq: Four times a day (QID) | INTRAMUSCULAR | Status: DC | PRN

## 2012-07-03 MED ORDER — INSULIN GLARGINE 100 UNIT/ML ~~LOC~~ SOLN
20.0000 [IU] | Freq: Every day | SUBCUTANEOUS | Status: DC
  Administered 2012-07-03: 20 [IU] via SUBCUTANEOUS

## 2012-07-03 MED ORDER — SODIUM CHLORIDE 0.9 % IJ SOLN: 3.0000 mL | Freq: Two times a day (BID) | INTRAMUSCULAR | Status: DC

## 2012-07-03 MED ORDER — ONDANSETRON HCL 4 MG PO TABS: 4.0000 mg | ORAL_TABLET | Freq: Four times a day (QID) | ORAL | Status: DC | PRN

## 2012-07-03 MED ORDER — INSULIN ASPART 100 UNIT/ML ~~LOC~~ SOLN
0.0000 [IU] | Freq: Three times a day (TID) | SUBCUTANEOUS | Status: DC
  Administered 2012-07-03: 5 [IU] via SUBCUTANEOUS
  Administered 2012-07-03: 2 [IU] via SUBCUTANEOUS
  Administered 2012-07-04: 1 [IU] via SUBCUTANEOUS
  Administered 2012-07-04: 2 [IU] via SUBCUTANEOUS
  Administered 2012-07-04: 1 [IU] via SUBCUTANEOUS
  Administered 2012-07-05: 2 [IU] via SUBCUTANEOUS

## 2012-07-03 MED ORDER — ACETAMINOPHEN 650 MG RE SUPP: 650.0000 mg | Freq: Four times a day (QID) | RECTAL | Status: DC | PRN

## 2012-07-03 MED ORDER — CARVEDILOL 25 MG PO TABS
25.0000 mg | ORAL_TABLET | Freq: Two times a day (BID) | ORAL | Status: DC
Start: 2012-07-03 — End: 2012-07-05
  Administered 2012-07-03 – 2012-07-05 (×5): 25 mg via ORAL
  Filled 2012-07-03 (×7): qty 1

## 2012-07-03 NOTE — Progress Notes (Signed)
[facility] PROGRESS NOTE  April [name] WUJ:[id number] DOB: [birth year] DOA: 07/02/2012 PCP: Default, Provider, MD  Assessment/Plan: 1. Bilateral lower extremity edema: venous duplex showed acute DVT noted in peroneal vein. She has a h/o subdural hematoma, so anticoagulation is contraindicated. IR consulted for IVC filter placement.  She is also symptomatically managed with IV lasix.  2. Acute on chronic Renal Failure: improving. Check BMP in am.  3. Hypertension suboptimally controlled. Resume home medications and IV prn hydralazine.  4. Dm: hypoglycemia event this am. Will half the dose of lantus. On SSI  CBG (last 3)   Basename 07/03/12 1132 07/03/12 0735 07/03/12 0702  GLUCAP 174* 89 56*  5. Anemia: probably from chronic kidney disease - stool for blood was negative. Closely follow CBC. 6. Subdural hematoma: no acute events this admission.   Code Status: full code Family Communication: none at bedside Disposition Plan: possibly 2 to 3 days   Consultants:  IR consult for IVC filter.   HPI/Subjective: No complaints.   Objective: Filed Vitals:   07/03/12 0500 07/03/12 0717 07/03/12 1010 07/03/12 1426  BP:  142/69 150/70 156/67  Pulse:  77  84  Temp:  97.7 F (36.5 C)  98.3 F (36.8 C)  TempSrc:      Resp:  20  16  Height:      Weight: 64.1 kg (141 lb 5 oz)     SpO2:  100%  98%    Intake/Output Summary (Last 24 hours) at 07/03/12 1512 Last data filed at 07/03/12 0700  Gross per 24 hour  Intake    600 ml  Output      0 ml  Net    600 ml   Filed Weights   07/03/12 0500  Weight: 64.1 kg (141 lb 5 oz)    Exam:   General:  Alert, comfortable, lying in bed, no new complaints.  Cardiovascular: s1s2  Respiratory: CTAB  Abdomen: soft nt nd bs+  Ext : bilateral lower extremity swelling.   Data Reviewed: Basic Metabolic Panel:  Lab 07/03/12 9562 07/02/12 2117  NA 142 134*  K 3.2* 3.9  CL 109 101  CO2 24 24  GLUCOSE 56* 172*  BUN 39* 45*    CREATININE 1.80* 2.06*  CALCIUM 8.7 8.9  MG -- --  PHOS -- --   Liver Function Tests:  Lab 07/03/12 0550 07/02/12 2117  AST 17 19  ALT 10 11  ALKPHOS 83 122*  BILITOT 0.2* 0.1*  PROT 5.5* 6.0  ALBUMIN 1.9* 2.1*   No results found for this basename: LIPASE:5,AMYLASE:5 in the last 168 hours No results found for this basename: AMMONIA:5 in the last 168 hours CBC:  Lab 07/03/12 0550 07/02/12 2117  WBC 6.7 6.5  NEUTROABS -- 3.9  HGB 8.4* 9.0*  HCT 25.2* 26.4*  MCV 79.2 81.0  PLT 271 270   Cardiac Enzymes: No results found for this basename: CKTOTAL:5,CKMB:5,CKMBINDEX:5,TROPONINI:5 in the last 168 hours BNP (last 3 results)  Basename 07/02/12 2116  PROBNP 184.3   CBG:  Lab 07/03/12 1132 07/03/12 0735 07/03/12 0702 07/03/12 0033 07/02/12 2000  GLUCAP 174* 89 56* 87 237*    No results found for this or any previous visit (from the past 240 hour(s)).   Studies: Dg Abd Acute W/chest  07/02/2012  *RADIOLOGY REPORT*  Clinical Data: Leg swelling, abdominal distension  ACUTE ABDOMEN SERIES (ABDOMEN 2 VIEW & CHEST 1 VIEW)  Comparison: Chest radiographs dated 06/20/2012  Findings: Lungs are essentially clear.  Prior right middle  lobe opacity is not well visualized on the current study, noting that he was most conspicuous on the prior lateral view.  No frank interstitial edema. No pleural effusion or pneumothorax.  Stable mild cardiomegaly.  No evidence of bowel obstruction. No evidence of free air on the lateral decubitus view.  Moderate colonic stool burden.  Degenerative changes of the visualized thoracolumbar spine.  IMPRESSION: No evidence of acute cardiopulmonary disease.  Prior right middle lobe opacity is not well visualized on the current study.  No evidence of small bowel obstruction or free air.  Moderate colonic stool burden.   Original Report Authenticated By: Charline Bills, M.D.     Scheduled Meds:   . amLODipine  10 mg Oral Daily  . atorvastatin  40 mg Oral QPM   . carvedilol  25 mg Oral BID WC  . cloNIDine  0.3 mg Transdermal Weekly  . hydrALAZINE  10 mg Oral BID  . insulin aspart  0-9 Units Subcutaneous TID WC  . insulin glargine  20 Units Subcutaneous Daily  . linagliptin  5 mg Oral Daily  . potassium chloride  40 mEq Oral BID  . sodium chloride  3 mL Intravenous Q12H  . sodium chloride  3 mL Intravenous Q12H   Continuous Infusions:   Principal Problem:  *Lower extremity edema Active Problems:  Diabetes mellitus  Anemia  Renal failure (ARF), acute on chronic  HTN (hypertension)        April [name]  [facility] Pager [phone]. If 8PM-8AM, please contact night-coverage at [email or web address], password [facility] 07/03/2012, 3:12 PM  LOS: 1 day

## 2012-07-03 NOTE — ED Provider Notes (Signed)
Medical screening examination/treatment/procedure(s) were conducted as a shared visit with non-physician practitioner(s) and myself.  I personally evaluated the patient during the encounter  See my additional note  Glynn Octave, MD 07/03/12 1208

## 2012-07-03 NOTE — Progress Notes (Signed)
Patient ID: April [name], female   DOB: Oct 23, 1921, 79 y.o.   MRN: [id number] Request received for placement of IVC filter in pt with hx of acute left LE DVT(peroneal ), and acute on chronic right frontal subdural hematoma. Additional PMH as below. Exam: pt awake , oriented to person , place; chest- CTA bilat ant; heart-RRR; abd- soft,+BS,NT; ext-FROM, 1-2+ bilat LE edema.    Filed Vitals:   07/03/12 0500 07/03/12 0717 07/03/12 1010 07/03/12 1426  BP:  142/69 150/70 156/67  Pulse:  77  84  Temp:  97.7 F (36.5 C)  98.3 F (36.8 C)  TempSrc:      Resp:  20  16  Height:      Weight: 141 lb 5 oz (64.1 kg)     SpO2:  100%  98%   Past Medical History  Diagnosis Date  . Diabetes mellitus   . Hypertension   . Hyperlipidemia   . Pneumonia   . Subdural hematoma   . Arthritis   . DVT (deep venous thrombosis)   . Incontinence    Past Surgical History  Procedure Date  . No past surgeries    Dg Chest 2 View  06/20/2012  *RADIOLOGY REPORT*  Clinical Data: Fever, diabetes.  CHEST - 2 VIEW  Comparison:  10/30/2004  Findings: Mild cardiomegaly.  Atheromatous aorta.  No effusion. Minimal spurring in the lower thoracic spine. Patchy coarse right middle lobe subsegmental atelectasis, scarring, or interstitial infiltrate, best seen on the lateral projection.  IMPRESSION:  1.  New patchy right middle lobe atelectasis , interstitial infiltrate, or scarring.   Original Report Authenticated By: D. Andria Rhein, MD    Ct Head Wo Contrast  06/20/2012  *RADIOLOGY REPORT*  Clinical Data: Altered mental status, confusion  CT HEAD WITHOUT CONTRAST  Technique:  Contiguous axial images were obtained from the base of the skull through the vertex without contrast.  Comparison: None.  Findings: There is a right frontal subdural hematoma with acute and chronic components, measure up to 13 mm in thickness.  This results in mild mass effect upon the right cerebral hemisphere but, only to a degree of underlying atrophy,  there is no significant midline shift.  No no subfalcine herniation.  No hydrocephalus. There is a fluid level in the sphenoid sinus. Atherosclerotic and physiologic intracranial calcifications.  Patchy areas of hypoattenuation in deep and periventricular white matter bilaterally.  Mild atrophy with resultant prominence of ventricles and sulci.  Acute infarct may be inapparent on noncontrast CT.  IMPRESSION:  1. Acute on chronic right frontal subdural hematoma, 13 mm thickness, with mild mass effect, no midline shift or hydrocephalus. 2.  Atrophy with nonspecific white matter changes as above. I telephoned the critical test results to Dr. Toniann Fail at the time of interpretation.   Original Report Authenticated By: D. Andria Rhein, MD    Dg Abd Acute W/chest  07/02/2012  *RADIOLOGY REPORT*  Clinical Data: Leg swelling, abdominal distension  ACUTE ABDOMEN SERIES (ABDOMEN 2 VIEW & CHEST 1 VIEW)  Comparison: Chest radiographs dated 06/20/2012  Findings: Lungs are essentially clear.  Prior right middle lobe opacity is not well visualized on the current study, noting that he was most conspicuous on the prior lateral view.  No frank interstitial edema. No pleural effusion or pneumothorax.  Stable mild cardiomegaly.  No evidence of bowel obstruction. No evidence of free air on the lateral decubitus view.  Moderate colonic stool burden.  Degenerative changes of the visualized thoracolumbar spine.  IMPRESSION: No  evidence of acute cardiopulmonary disease.  Prior right middle lobe opacity is not well visualized on the current study.  No evidence of small bowel obstruction or free air.  Moderate colonic stool burden.   Original Report Authenticated By: Charline Bills, M.D.   Results for orders placed during the hospital encounter of 07/02/12  GLUCOSE, CAPILLARY      Component Value Range   Glucose-Capillary 237 (*) 70 - 99 mg/dL  CBC WITH DIFFERENTIAL      Component Value Range   WBC 6.5  4.0 - 10.5 K/uL   RBC 3.26  (*) 3.87 - 5.11 MIL/uL   Hemoglobin 9.0 (*) 12.0 - 15.0 g/dL   HCT 16.1 (*) 09.6 - 04.5 %   MCV 81.0  78.0 - 100.0 fL   MCH 27.6  26.0 - 34.0 pg   MCHC 34.1  30.0 - 36.0 g/dL   RDW 40.9  81.1 - 91.4 %   Platelets 270  150 - 400 K/uL   Neutrophils Relative 59  43 - 77 %   Neutro Abs 3.9  1.7 - 7.7 K/uL   Lymphocytes Relative 28  12 - 46 %   Lymphs Abs 1.8  0.7 - 4.0 K/uL   Monocytes Relative 9  3 - 12 %   Monocytes Absolute 0.6  0.1 - 1.0 K/uL   Eosinophils Relative 4  0 - 5 %   Eosinophils Absolute 0.3  0.0 - 0.7 K/uL   Basophils Relative 1  0 - 1 %   Basophils Absolute 0.0  0.0 - 0.1 K/uL  COMPREHENSIVE METABOLIC PANEL      Component Value Range   Sodium 134 (*) 135 - 145 mEq/L   Potassium 3.9  3.5 - 5.1 mEq/L   Chloride 101  96 - 112 mEq/L   CO2 24  19 - 32 mEq/L   Glucose, Bld 172 (*) 70 - 99 mg/dL   BUN 45 (*) 6 - 23 mg/dL   Creatinine, Ser 7.82 (*) 0.50 - 1.10 mg/dL   Calcium 8.9  8.4 - 95.6 mg/dL   Total Protein 6.0  6.0 - 8.3 g/dL   Albumin 2.1 (*) 3.5 - 5.2 g/dL   AST 19  0 - 37 U/L   ALT 11  0 - 35 U/L   Alkaline Phosphatase 122 (*) 39 - 117 U/L   Total Bilirubin 0.1 (*) 0.3 - 1.2 mg/dL   GFR calc non Af Amer 20 (*) >90 mL/min   GFR calc Af Amer 24 (*) >90 mL/min  URINALYSIS, ROUTINE W REFLEX MICROSCOPIC      Component Value Range   Color, Urine YELLOW  YELLOW   APPearance CLEAR  CLEAR   Specific Gravity, Urine 1.021  1.005 - 1.030   pH 5.0  5.0 - 8.0   Glucose, UA 500 (*) NEGATIVE mg/dL   Hgb urine dipstick SMALL (*) NEGATIVE   Bilirubin Urine NEGATIVE  NEGATIVE   Ketones, ur NEGATIVE  NEGATIVE mg/dL   Protein, ur >213 (*) NEGATIVE mg/dL   Urobilinogen, UA 0.2  0.0 - 1.0 mg/dL   Nitrite NEGATIVE  NEGATIVE   Leukocytes, UA NEGATIVE  NEGATIVE  PRO B NATRIURETIC PEPTIDE      Component Value Range   Pro B Natriuretic peptide (BNP) 184.3  0 - 450 pg/mL  URINE MICROSCOPIC-ADD ON      Component Value Range   Squamous Epithelial / LPF RARE  RARE   WBC, UA 0-2   <3 WBC/hpf   RBC /  HPF 0-2  <3 RBC/hpf   Bacteria, UA RARE  RARE   Casts GRANULAR CAST (*) NEGATIVE  OCCULT BLOOD, POC DEVICE      Component Value Range   Fecal Occult Bld NEGATIVE  NEGATIVE  GLUCOSE, CAPILLARY      Component Value Range   Glucose-Capillary 87  70 - 99 mg/dL  HEPATIC FUNCTION PANEL      Component Value Range   Total Protein 5.5 (*) 6.0 - 8.3 g/dL   Albumin 1.9 (*) 3.5 - 5.2 g/dL   AST 17  0 - 37 U/L   ALT 10  0 - 35 U/L   Alkaline Phosphatase 83  39 - 117 U/L   Total Bilirubin 0.2 (*) 0.3 - 1.2 mg/dL   Bilirubin, Direct <1.6  0.0 - 0.3 mg/dL   Indirect Bilirubin NOT CALCULATED  0.3 - 0.9 mg/dL  BASIC METABOLIC PANEL      Component Value Range   Sodium 142  135 - 145 mEq/L   Potassium 3.2 (*) 3.5 - 5.1 mEq/L   Chloride 109  96 - 112 mEq/L   CO2 24  19 - 32 mEq/L   Glucose, Bld 56 (*) 70 - 99 mg/dL   BUN 39 (*) 6 - 23 mg/dL   Creatinine, Ser 1.09 (*) 0.50 - 1.10 mg/dL   Calcium 8.7  8.4 - 60.4 mg/dL   GFR calc non Af Amer 24 (*) >90 mL/min   GFR calc Af Amer 28 (*) >90 mL/min  CBC      Component Value Range   WBC 6.7  4.0 - 10.5 K/uL   RBC 3.18 (*) 3.87 - 5.11 MIL/uL   Hemoglobin 8.4 (*) 12.0 - 15.0 g/dL   HCT 54.0 (*) 98.1 - 19.1 %   MCV 79.2  78.0 - 100.0 fL   MCH 26.4  26.0 - 34.0 pg   MCHC 33.3  30.0 - 36.0 g/dL   RDW 47.8  29.5 - 62.1 %   Platelets 271  150 - 400 K/uL  GLUCOSE, CAPILLARY      Component Value Range   Glucose-Capillary 56 (*) 70 - 99 mg/dL  GLUCOSE, CAPILLARY      Component Value Range   Glucose-Capillary 89  70 - 99 mg/dL  GLUCOSE, CAPILLARY      Component Value Range   Glucose-Capillary 174 (*) 70 - 99 mg/dL   Comment 1 Notify RN     Comment 2 Documented in Chart     A/P: Pt with acute left LE DVT, acute on chronic right frontal subdural hematoma and acute renal failure on chronic kidney disease. Plan is tent for placement of IVC filter on 1/29 utilizing CO2. Details/risks of procedure d/w pt/pt's son with their  understanding and consent.

## 2012-07-03 NOTE — Progress Notes (Signed)
Inpatient Diabetes Program Recommendations  AACE/ADA: New Consensus Statement on Inpatient Glycemic Control (2013)  Target Ranges:  Prepandial:   less than 140 mg/dL      Peak postprandial:   less than 180 mg/dL (1-2 hours)      Critically ill patients:  140 - 180 mg/dL   Reason for Visit: Results for BRIGIDA, SCOTTI (MRN [id number]) as of 07/03/2012 11:19  Ref. Range 07/02/2012 19:06 07/02/2012 20:00 07/03/2012 00:33 07/03/2012 07:02 07/03/2012 07:35  Glucose-Capillary Latest Range: 70-99 mg/dL 409 (H) 811 (H) 87 56 (L) 89  Results for RAISHA, BRABENDER (MRN [id number]) as of 07/03/2012 11:19  Ref. Range 06/21/2012 09:20  Hemoglobin A1C Latest Range: <5.7 % 16.1 (H)    Note patient was recently in the hospital and discharged home on Lantus 15 units daily.  CBG was low this morning.   Patient received Lantus 20 units this morning.   Consider reducing Lantus to 14 units daily. Note glucose targets will likely be higher for this patient due to risk of hypoglycemia. Will continue to monitor.

## 2012-07-03 NOTE — Progress Notes (Signed)
Hypoglycemic Event  CBG: 56 @ 0702   Treatment: 15 GM carbohydrate snack  Symptoms: None  Follow-up CBG: Time:0735 CBG Result:89  Possible Reasons for Event: Unknown  Comments/MD notified: Pt was asymptomatic, received breakfast tray at 0700 as well so she began eating her breakfast after the 15gm carb snack    April [name]  Remember to initiate Hypoglycemia Order Set & complete

## 2012-07-03 NOTE — H&P (Signed)
April [name] is an 79 y.o. female. The patient was seen and examined on July 03, 2012. History obtained from patient's son and the ER physician.   Chief Complaint: Lower extremity edema. HPI: 79 year-old female with history of diabetes mellitus type 2, chronic kidney disease, anemia and hypertension who was just recently discharged from hospital after being admitted for pneumonia and at that time was found to have subdural hematoma was brought to the ER after patient was found to have increasing lower extremity edema. Patient also has some blistering of the skin in 2 places. One of the blisters ruptured in the ER. The swelling is more on the right lower extremity than the left. In addition patient's son states her blood sugar was getting increasingly difficult to control. Patient otherwise did not have any nausea vomiting diarrhea. Denies any chest pain or shortness of breath. In the ER patient was found to have elevated creatinine from the baseline with anemia. Stool for occult blood was negative.  Past Medical History  Diagnosis Date  . Diabetes mellitus   . Hypertension   . Hyperlipidemia   . Pneumonia   . Subdural hematoma     History reviewed. No pertinent past surgical history.  Family History  Problem Relation Age of Onset  . Diabetes Mellitus II Son    Social History:  reports that she has never smoked. She does not have any smokeless tobacco history on file. She reports that she does not drink alcohol or use illicit drugs.  Allergies: No Known Allergies  Medications Prior to Admission  Medication Sig Dispense Refill  . amLODipine (NORVASC) 10 MG tablet Take 1 tablet (10 mg total) by mouth daily.  30 tablet  1  . atorvastatin (LIPITOR) 40 MG tablet Take 40 mg by mouth every evening.      . carvedilol (COREG) 25 MG tablet Take 1 tablet (25 mg total) by mouth 2 (two) times daily with a meal.  60 tablet  3  . cloNIDine (CATAPRES - DOSED IN MG/24 HR) 0.3 mg/24hr Place 1 patch  (0.3 mg total) onto the skin once a week.  4 patch  2  . insulin glargine (LANTUS) 100 UNIT/ML injection Inject 20 Units into the skin daily.      . sitaGLIPtin (JANUVIA) 50 MG tablet Take 1 tablet (50 mg total) by mouth daily.  30 tablet  1  . telmisartan (MICARDIS) 80 MG tablet Take 1 tablet (80 mg total) by mouth daily.  30 tablet  3    Results for orders placed during the hospital encounter of 07/02/12 (from the past 48 hour(s))  GLUCOSE, CAPILLARY     Status: Abnormal   Collection Time   07/02/12  8:00 PM      Component Value Range Comment   Glucose-Capillary 237 (*) 70 - 99 mg/dL   URINALYSIS, ROUTINE W REFLEX MICROSCOPIC     Status: Abnormal   Collection Time   07/02/12  8:48 PM      Component Value Range Comment   Color, Urine YELLOW  YELLOW    APPearance CLEAR  CLEAR    Specific Gravity, Urine 1.021  1.005 - 1.030    pH 5.0  5.0 - 8.0    Glucose, UA 500 (*) NEGATIVE mg/dL    Hgb urine dipstick SMALL (*) NEGATIVE    Bilirubin Urine NEGATIVE  NEGATIVE    Ketones, ur NEGATIVE  NEGATIVE mg/dL    Protein, ur >454 (*) NEGATIVE mg/dL    Urobilinogen, UA 0.2  0.0 - 1.0 mg/dL    Nitrite NEGATIVE  NEGATIVE    Leukocytes, UA NEGATIVE  NEGATIVE   URINE MICROSCOPIC-ADD ON     Status: Abnormal   Collection Time   07/02/12  8:48 PM      Component Value Range Comment   Squamous Epithelial / LPF RARE  RARE    WBC, UA 0-2  <3 WBC/hpf    RBC / HPF 0-2  <3 RBC/hpf    Bacteria, UA RARE  RARE    Casts GRANULAR CAST (*) NEGATIVE RARE  PRO B NATRIURETIC PEPTIDE     Status: Normal   Collection Time   07/02/12  9:16 PM      Component Value Range Comment   Pro B Natriuretic peptide (BNP) 184.3  0 - 450 pg/mL   CBC WITH DIFFERENTIAL     Status: Abnormal   Collection Time   07/02/12  9:17 PM      Component Value Range Comment   WBC 6.5  4.0 - 10.5 K/uL    RBC 3.26 (*) 3.87 - 5.11 MIL/uL    Hemoglobin 9.0 (*) 12.0 - 15.0 g/dL    HCT 14.7 (*) 82.9 - 46.0 %    MCV 81.0  78.0 - 100.0 fL    MCH  27.6  26.0 - 34.0 pg    MCHC 34.1  30.0 - 36.0 g/dL    RDW 56.2  13.0 - 86.5 %    Platelets 270  150 - 400 K/uL    Neutrophils Relative 59  43 - 77 %    Neutro Abs 3.9  1.7 - 7.7 K/uL    Lymphocytes Relative 28  12 - 46 %    Lymphs Abs 1.8  0.7 - 4.0 K/uL    Monocytes Relative 9  3 - 12 %    Monocytes Absolute 0.6  0.1 - 1.0 K/uL    Eosinophils Relative 4  0 - 5 %    Eosinophils Absolute 0.3  0.0 - 0.7 K/uL    Basophils Relative 1  0 - 1 %    Basophils Absolute 0.0  0.0 - 0.1 K/uL   COMPREHENSIVE METABOLIC PANEL     Status: Abnormal   Collection Time   07/02/12  9:17 PM      Component Value Range Comment   Sodium 134 (*) 135 - 145 mEq/L    Potassium 3.9  3.5 - 5.1 mEq/L    Chloride 101  96 - 112 mEq/L    CO2 24  19 - 32 mEq/L    Glucose, Bld 172 (*) 70 - 99 mg/dL    BUN 45 (*) 6 - 23 mg/dL    Creatinine, Ser 7.84 (*) 0.50 - 1.10 mg/dL    Calcium 8.9  8.4 - 69.6 mg/dL    Total Protein 6.0  6.0 - 8.3 g/dL    Albumin 2.1 (*) 3.5 - 5.2 g/dL    AST 19  0 - 37 U/L    ALT 11  0 - 35 U/L    Alkaline Phosphatase 122 (*) 39 - 117 U/L    Total Bilirubin 0.1 (*) 0.3 - 1.2 mg/dL    GFR calc non Af Amer 20 (*) >90 mL/min    GFR calc Af Amer 24 (*) >90 mL/min   OCCULT BLOOD, POC DEVICE     Status: Normal   Collection Time   07/02/12 11:33 PM      Component Value Range Comment   Fecal Occult Bld NEGATIVE  NEGATIVE   GLUCOSE, CAPILLARY     Status: Normal   Collection Time   07/03/12 12:33 AM      Component Value Range Comment   Glucose-Capillary 87  70 - 99 mg/dL    Dg Abd Acute W/chest  07/02/2012  *RADIOLOGY REPORT*  Clinical Data: Leg swelling, abdominal distension  ACUTE ABDOMEN SERIES (ABDOMEN 2 VIEW & CHEST 1 VIEW)  Comparison: Chest radiographs dated 06/20/2012  Findings: Lungs are essentially clear.  Prior right middle lobe opacity is not well visualized on the current study, noting that he was most conspicuous on the prior lateral view.  No frank interstitial edema. No pleural  effusion or pneumothorax.  Stable mild cardiomegaly.  No evidence of bowel obstruction. No evidence of free air on the lateral decubitus view.  Moderate colonic stool burden.  Degenerative changes of the visualized thoracolumbar spine.  IMPRESSION: No evidence of acute cardiopulmonary disease.  Prior right middle lobe opacity is not well visualized on the current study.  No evidence of small bowel obstruction or free air.  Moderate colonic stool burden.   Original Report Authenticated By: Charline Bills, M.D.     Review of Systems  Constitutional: Negative.   HENT: Negative.   Eyes: Negative.   Respiratory: Negative.   Cardiovascular: Negative.   Gastrointestinal: Negative.   Genitourinary: Negative.   Musculoskeletal:       Lower extremity swelling.  Skin: Negative.   Neurological: Negative.   Endo/Heme/Allergies: Negative.   Psychiatric/Behavioral: Negative.     Blood pressure 151/68, pulse 88, temperature 97.5 F (36.4 C), temperature source Oral, resp. rate 18, SpO2 100.00%. Physical Exam  Constitutional: She is oriented to person, place, and time. She appears well-developed and well-nourished. No distress.  HENT:  Head: Normocephalic and atraumatic.  Right Ear: External ear normal.  Left Ear: External ear normal.  Nose: Nose normal.  Mouth/Throat: Oropharynx is clear and moist. No oropharyngeal exudate.  Eyes: Conjunctivae normal are normal. Pupils are equal, round, and reactive to light. Right eye exhibits no discharge. Left eye exhibits no discharge. No scleral icterus.  Neck: Normal range of motion. Neck supple.  Cardiovascular: Normal rate and regular rhythm.   Respiratory: Effort normal and breath sounds normal. No respiratory distress. She has no wheezes. She has no rales.  GI: Soft. Bowel sounds are normal.  Musculoskeletal: She exhibits edema.  Neurological: She is alert and oriented to person, place, and time.       Oriented to name and place. Moves all  extremities.  Skin: Skin is warm and dry. She is not diaphoretic.     Assessment/Plan #1. Lower extremity edema - at this time possibilities include worsening of her renal function versus DVT. Since her swelling is more the right side at this time we will order duplex to rule out DVT. If negative for DVT then patient probably related IV Lasix. #2. Acute renal failure on chronic kidney disease - hold Micardis for now. Patient did receive some IV fluids in the ER which we will hold for now. Closely follow intake output and metabolic panel. #3. Hypertension - continue home medications except for Micardis. Patient will be placed on hydralazine IV when necessary for systolic blood pressure more than 160. #4. Diabetes mellitus type 2 - continue home medications with close followup of CBG with sliding scale coverage. #5. Anemia probably from chronic kidney disease - stool for blood was negative. Closely follow CBC. #6. Subdural hematoma recently.  CODE STATUS - full code as per the  patient's son. Patient's son Mr. Texidor can be reached at [phone].   Michel Eskelson N. 07/03/2012, 2:53 AM

## 2012-07-03 NOTE — Progress Notes (Signed)
Right:  No evidence of DVT, superficial thrombosis, or Baker's cyst.  Left: DVT noted in the peroneal vein.  No evidence of superficial thrombosis.  No Baker's cyst.

## 2012-07-04 ENCOUNTER — Inpatient Hospital Stay (HOSPITAL_COMMUNITY): Payer: Medicare Other

## 2012-07-04 DIAGNOSIS — N179 Acute kidney failure, unspecified: Secondary | ICD-10-CM

## 2012-07-04 DIAGNOSIS — I82409 Acute embolism and thrombosis of unspecified deep veins of unspecified lower extremity: Secondary | ICD-10-CM

## 2012-07-04 LAB — PROTIME-INR
INR: 1 (ref 0.00–1.49)
Prothrombin Time: 13.1 seconds (ref 11.6–15.2)

## 2012-07-04 LAB — GLUCOSE, CAPILLARY
Glucose-Capillary: 156 mg/dL — ABNORMAL HIGH (ref 70–99)
Glucose-Capillary: 180 mg/dL — ABNORMAL HIGH (ref 70–99)
Glucose-Capillary: 252 mg/dL — ABNORMAL HIGH (ref 70–99)

## 2012-07-04 LAB — BASIC METABOLIC PANEL
BUN: 38 mg/dL — ABNORMAL HIGH (ref 6–23)
Chloride: 109 mEq/L (ref 96–112)
GFR calc Af Amer: 27 mL/min — ABNORMAL LOW (ref >90)
GFR calc non Af Amer: 23 mL/min — ABNORMAL LOW (ref >90)
Potassium: 3.7 mEq/L (ref 3.5–5.1)
Sodium: 143 mEq/L (ref 135–145)

## 2012-07-04 LAB — CBC
HCT: 27.3 % — ABNORMAL LOW (ref 36.0–46.0)
Hemoglobin: 9.1 g/dL — ABNORMAL LOW (ref 12.0–15.0)
MCH: 26.8 pg (ref 26.0–34.0)
MCHC: 33.3 g/dL (ref 30.0–36.0)
RBC: 3.4 MIL/uL — ABNORMAL LOW (ref 3.87–5.11)

## 2012-07-04 MED ORDER — FENTANYL CITRATE 0.05 MG/ML IJ SOLN
INTRAMUSCULAR | Status: AC | PRN
  Administered 2012-07-04: 25 ug via INTRAVENOUS

## 2012-07-04 MED ORDER — INSULIN ASPART 100 UNIT/ML ~~LOC~~ SOLN: 3.0000 [IU] | Freq: Once | SUBCUTANEOUS | Status: DC

## 2012-07-04 MED ORDER — MIDAZOLAM HCL 2 MG/2ML IJ SOLN
INTRAMUSCULAR | Status: AC | PRN
  Administered 2012-07-04: 1 mg via INTRAVENOUS

## 2012-07-04 NOTE — Progress Notes (Signed)
[facility]             Progress Note   Subjective: No complaints. Son [name] present and updated on plan of care.  Objective: Vital signs in last 24 hours: Temp:  [97.5 F (36.4 C)-99 F (37.2 C)] 97.5 F (36.4 C) (01/29 1120) Pulse Rate:  [73-94] 81  (01/29 1120) Resp:  [13-19] 15  (01/29 1120) BP: (141-172)/(55-85) 141/55 mmHg (01/29 1120) SpO2:  [97 %-100 %] 100 % (01/29 1120) Weight change:  Last BM Date: 07/03/12  Intake/Output from previous day: 01/28 0701 - 01/29 0700 In: 3 [I.V.:3] Out: -      Physical Exam: General: Alert, awake, oriented x3, in no acute distress. HEENT: No bruits, no goiter. Heart: Regular rate and rhythm, without murmurs, rubs, gallops. Lungs: Clear to auscultation bilaterally. Abdomen: Soft, nontender, nondistended, positive bowel sounds. Extremities: No clubbing cyanosis or edema with positive pedal pulses, small blister over outer left thigh and a second one over the inner right knee. Neuro: Grossly intact, nonfocal.    Lab Results: Basic Metabolic Panel:  Basename 07/04/12 0635 07/03/12 0550  NA 143 142  K 3.7 3.2*  CL 109 109  CO2 26 24  GLUCOSE 142* 56*  BUN 38* 39*  CREATININE 1.87* 1.80*  CALCIUM 8.9 8.7  MG -- --  PHOS -- --   Liver Function Tests:  Basename 07/03/12 0550 07/02/12 2117  AST 17 19  ALT 10 11  ALKPHOS 83 122*  BILITOT 0.2* 0.1*  PROT 5.5* 6.0  ALBUMIN 1.9* 2.1*   CBC:  Basename 07/04/12 0635 07/03/12 0550 07/02/12 2117  WBC 5.6 6.7 --  NEUTROABS -- -- 3.9  HGB 9.1* 8.4* --  HCT 27.3* 25.2* --  MCV 80.3 79.2 --  PLT 277 271 --   BNP:  Basename 07/02/12 2116  PROBNP 184.3   CBG:  Basename 07/04/12 1056 07/04/12 0705 07/04/12 0011 07/03/12 2149 07/03/12 1608 07/03/12 1132  GLUCAP 138* 131* 252* 322* 278* 174*   Coagulation:  Basename 07/04/12 0635  LABPROT 13.1  INR 1.00   Urinalysis:  Basename 07/02/12 2048  COLORURINE YELLOW  LABSPEC 1.021  PHURINE 5.0    GLUCOSEU 500*  HGBUR SMALL*  BILIRUBINUR NEGATIVE  KETONESUR NEGATIVE  PROTEINUR >300*  UROBILINOGEN 0.2  NITRITE NEGATIVE  LEUKOCYTESUR NEGATIVE    Studies/Results: Dg Abd Acute W/chest  07/02/2012  *RADIOLOGY REPORT*  Clinical Data: Leg swelling, abdominal distension  ACUTE ABDOMEN SERIES (ABDOMEN 2 VIEW & CHEST 1 VIEW)  Comparison: Chest radiographs dated 06/20/2012  Findings: Lungs are essentially clear.  Prior right middle lobe opacity is not well visualized on the current study, noting that he was most conspicuous on the prior lateral view.  No frank interstitial edema. No pleural effusion or pneumothorax.  Stable mild cardiomegaly.  No evidence of bowel obstruction. No evidence of free air on the lateral decubitus view.  Moderate colonic stool burden.  Degenerative changes of the visualized thoracolumbar spine.  IMPRESSION: No evidence of acute cardiopulmonary disease.  Prior right middle lobe opacity is not well visualized on the current study.  No evidence of small bowel obstruction or free air.  Moderate colonic stool burden.   Original Report Authenticated By: Charline Bills, M.D.     Medications: Scheduled Meds:    . amLODipine  10 mg Oral Daily  . atorvastatin  40 mg Oral QPM  . carvedilol  25 mg Oral BID WC  . cloNIDine  0.3 mg Transdermal Weekly  . hydrALAZINE  10  mg Oral BID  . insulin aspart  0-9 Units Subcutaneous TID WC  . insulin glargine  8 Units Subcutaneous Daily  . linagliptin  5 mg Oral Daily  . sodium chloride  3 mL Intravenous Q12H  . sodium chloride  3 mL Intravenous Q12H   Continuous Infusions:  PRN Meds:.acetaminophen, acetaminophen, dextrose, glucose-Vitamin C, hydrALAZINE, ondansetron (ZOFRAN) IV, ondansetron  Assessment/Plan:  Principal Problem:  *DVT (deep venous thrombosis) Active Problems:  Diabetes mellitus  Lower extremity edema  Anemia  ARF (acute renal failure)  HTN (hypertension)  CKD (chronic kidney disease) stage 3, GFR 30-59  ml/min   LLE DVT -Cannot anticoagulate given history of SDH. -IVC filter has been placed.  DM II -Fair control. -Continue current regimen.  Acute on CKD Stage III -Baseline Cr appears to be around 1.6-1.7. -Will give some IVF today.  Disposition -Plan DC home in am.   Time spent coordinating care: 35 minutes.   LOS: 2 days   [facility] Pager: [phone] 07/04/2012, 12:46 PM

## 2012-07-04 NOTE — Progress Notes (Signed)
Agree with PA note.    Signed,  Yuliet Needs K. Francella Barnett, MD Vascular & Interventional Radiologist [facility]  

## 2012-07-04 NOTE — Procedures (Signed)
Interventional Radiology Procedure Note  Procedure: Placement of an infrarenal IVC filter Complications: None Recommendations: - Bedrest with HOB elevated x 2 hrs - KUB in 24 hrs to document filter position - While this filter is potentially retrievable, given the patient's clinical history it will likely serve as a permanent device.  Signed,  Sterling Big, MD Vascular & Interventional Radiologist [facility]

## 2012-07-05 DIAGNOSIS — I82409 Acute embolism and thrombosis of unspecified deep veins of unspecified lower extremity: Secondary | ICD-10-CM

## 2012-07-05 LAB — GLUCOSE, CAPILLARY

## 2012-07-05 NOTE — Progress Notes (Signed)
Patient pulled off telemetry leads multiple times during the night.  Calls received from remote telemetry monitoring.  Leads reattached and reinforced need for patient to keep tele leads on, will continue to monitor.

## 2012-07-05 NOTE — Progress Notes (Signed)
Patient discharged in stable condition via wheelchair. Discharge instructions and prescriptions were given and explained 

## 2012-07-05 NOTE — Discharge Summary (Signed)
Physician Discharge Summary  Patient ID: April [name] MRN: [id number] DOB/AGE: 1921/09/20 79 y.o.  Admit date: 07/02/2012 Discharge date: 07/05/2012  Primary Care Physician:  Default, Provider, MD   Discharge Diagnoses:    Principal Problem:  *DVT (deep venous thrombosis) Active Problems:  Diabetes mellitus  Lower extremity edema  Anemia  ARF (acute renal failure)  HTN (hypertension)  CKD (chronic kidney disease) stage 3, GFR 30-59 ml/min      Medication List     As of 07/05/2012 12:41 PM    TAKE these medications         amLODipine 10 MG tablet   Commonly known as: NORVASC   Take 1 tablet (10 mg total) by mouth daily.      atorvastatin 40 MG tablet   Commonly known as: LIPITOR   Take 40 mg by mouth every evening.      carvedilol 25 MG tablet   Commonly known as: COREG   Take 1 tablet (25 mg total) by mouth 2 (two) times daily with a meal.      cloNIDine 0.3 mg/24hr   Commonly known as: CATAPRES - Dosed in mg/24 hr   Place 1 patch (0.3 mg total) onto the skin once a week.      insulin glargine 100 UNIT/ML injection   Commonly known as: LANTUS   Inject 20 Units into the skin daily.      sitaGLIPtin 50 MG tablet   Commonly known as: JANUVIA   Take 1 tablet (50 mg total) by mouth daily.      telmisartan 80 MG tablet   Commonly known as: MICARDIS   Take 1 tablet (80 mg total) by mouth daily.         Disposition and Follow-up:  Will be discharged home today in stable and improved condition. Has been advised to follow up with her PCP in 2 weeks.  Consults:  Dr. Archer Asa, IR   Significant Diagnostic Studies:  Ir Ivc Filter Plmt / S&i /img Guid/mod Sed  07/04/2012  *RADIOLOGY REPORT*  IR IVC FILTER PLACEMENT  Date: 07/04/2012  Clinical History: 79 year old female with lower extremity swelling and acute DVT.  She has a history of subdural hematoma and anticoagulation is therefore contraindicated.  She requires caval interruption for PE prophylaxis.   Procedures Performed: 1. Ultrasound-guided puncture the right internal jugular vein 2.  CO2 inferior venacavagram 3.  Placement of an infrarenal IVC filter 4.  Repeat C8-2 cava gram  Interventional Radiologist:  Sterling Big, MD  Sedation: Moderate (conscious) sedation was used.  One mg Versed, 25 mcg Fentanyl were administered intravenously.  The patient's vital signs were monitored continuously by radiology nursing throughout the procedure.  Sedation Time: 82 minutes  Fluoroscopy time: 1.1 minutes  Contrast volume: 100 ml CO2 gas administered intravenously  PROCEDURE/FINDINGS:   Informed consent was obtained from the patient following explanation of the procedure, risks, benefits and alternatives. The patient understands, agrees and consents for the procedure. All questions were addressed. A time out was performed.  Maximal barrier sterile technique utilized including caps, mask, sterile gowns, sterile gloves, large sterile drape, hand hygiene, and betadine skin prep.  The right neck was interrogated with ultrasound.  The internal jugular vein was found be widely patent. Local anesthesia was obtained by infiltration of 1% lidocaine.  A small dermatotomy was made.  Under direct sonographic guidance, the right internal jugular vein was punctured with a 21-gauge micropuncture needle. An image was obtained stored for the medical record.  A  transitional sheath was introduced over a micro wire into the superior vena cava.  A Benson wire was then navigated into the inferior vena cava.  The tract was serially dilated and the injectable filter sheath was advanced over the wire and positioned in the distal IVC.  Carbon dioxide gas was injected to perform an inferior venacavagram.  There is no evidence of caval thrombus. The cava measures less than 30 mm in diameter.  The bilateral renal veins were identified.  No evidence of duplicated renal vein or other vascular anomaly.  A Bard Denali IVC filter was then placed in  an infrarenal location using fluoroscopic guidance.  Post placement CO2 venogram confirmed its location.  The sheath was removed and hemostasis obtained by manual pressure. The patient tolerated the procedure very well, there is no immediate complication.  IMPRESSION:  Successful placement of a Bard Denali potentially retrievable IVC filter in an infrarenal location.  Although the filter is potentially retrievable, given the patient's history of chronic right frontal subdural hematoma this filter will likely be a permanent device.  Signed,  Sterling Big, MD Vascular & Interventional Radiologist [facility]   Original Report Authenticated By: Malachy Moan, M.D.     Brief H and P: For complete details please refer to admission H and P, but in brief patient is an 79 year-old woman with history of diabetes mellitus type 2, chronic kidney disease, anemia and hypertension who was just recently discharged from hospital after being admitted for pneumonia and at that time was found to have subdural hematoma was brought to the ER after patient was found to have increasing lower extremity edema. Patient also has some blistering of the skin in 2 places. One of the blisters ruptured in the ER. The swelling is more on the right lower extremity than the left. In addition patient's son states her blood sugar was getting increasingly difficult to control. Patient otherwise did not have any nausea vomiting diarrhea. Denies any chest pain or shortness of breath. In the ER patient was found to have elevated creatinine from the baseline with anemia. Stool for occult blood was negative. We were asked to admit her for further evaluation and management.     Hospital Course:  Principal Problem:  *DVT (deep venous thrombosis) Active Problems:  Diabetes mellitus  Lower extremity edema  Anemia  ARF (acute renal failure)  HTN (hypertension)  CKD (chronic kidney disease) stage 3, GFR 30-59 ml/min    LLE  DVT -We cannot anticoagulate her because of her recent SDH. -IVC filter placed.  Acute on CKD Stage III -Baseline Cr appears to be around 1.6-1.7. -Cr has improved from 2.06 to 1.8 at time of DC.  DM-II -Well controlled.    Time spent on Discharge: Greater than 30 minutes.  SignedChaya Jan [facility] Pager: [phone] 07/05/2012, 12:42 PM

## 2012-07-23 ENCOUNTER — Emergency Department (HOSPITAL_COMMUNITY): Payer: Medicare Other | Admitting: Certified Registered"

## 2012-07-23 ENCOUNTER — Emergency Department (HOSPITAL_COMMUNITY): Payer: Medicare Other

## 2012-07-23 ENCOUNTER — Inpatient Hospital Stay (HOSPITAL_COMMUNITY)
Admission: EM | Admit: 2012-07-23 | Discharge: 2012-07-30 | DRG: 026 | Disposition: A | Discharge status: Rehabilitation Facility | Payer: Medicare Other | Attending: Neurosurgery | Admitting: Neurosurgery

## 2012-07-23 ENCOUNTER — Encounter (HOSPITAL_COMMUNITY): Payer: Self-pay | Admitting: Certified Registered"

## 2012-07-23 ENCOUNTER — Encounter (HOSPITAL_COMMUNITY): Payer: Self-pay | Admitting: *Deleted

## 2012-07-23 ENCOUNTER — Encounter (HOSPITAL_COMMUNITY)
Admission: EM | Disposition: A | Discharge status: Rehabilitation Facility | Payer: Self-pay | Source: Home / Self Care | Attending: Neurosurgery

## 2012-07-23 DIAGNOSIS — I129 Hypertensive chronic kidney disease with stage 1 through stage 4 chronic kidney disease, or unspecified chronic kidney disease: Secondary | ICD-10-CM | POA: Diagnosis present

## 2012-07-23 DIAGNOSIS — N39498 Other specified urinary incontinence: Secondary | ICD-10-CM | POA: Diagnosis present

## 2012-07-23 DIAGNOSIS — G8190 Hemiplegia, unspecified affecting unspecified side: Secondary | ICD-10-CM | POA: Diagnosis present

## 2012-07-23 DIAGNOSIS — G609 Hereditary and idiopathic neuropathy, unspecified: Secondary | ICD-10-CM | POA: Diagnosis present

## 2012-07-23 DIAGNOSIS — E785 Hyperlipidemia, unspecified: Secondary | ICD-10-CM | POA: Diagnosis present

## 2012-07-23 DIAGNOSIS — N183 Chronic kidney disease, stage 3 unspecified: Secondary | ICD-10-CM | POA: Diagnosis present

## 2012-07-23 DIAGNOSIS — I6200 Nontraumatic subdural hemorrhage, unspecified: Principal | ICD-10-CM | POA: Diagnosis present

## 2012-07-23 DIAGNOSIS — Z86718 Personal history of other venous thrombosis and embolism: Secondary | ICD-10-CM

## 2012-07-23 DIAGNOSIS — S065X9A Traumatic subdural hemorrhage with loss of consciousness of unspecified duration, initial encounter: Secondary | ICD-10-CM

## 2012-07-23 DIAGNOSIS — Z833 Family history of diabetes mellitus: Secondary | ICD-10-CM

## 2012-07-23 DIAGNOSIS — E119 Type 2 diabetes mellitus without complications: Secondary | ICD-10-CM | POA: Diagnosis present

## 2012-07-23 DIAGNOSIS — Z794 Long term (current) use of insulin: Secondary | ICD-10-CM

## 2012-07-23 HISTORY — DX: Disorder of kidney and ureter, unspecified: N28.9

## 2012-07-23 HISTORY — PX: CRANIOTOMY: SHX93

## 2012-07-23 LAB — COMPREHENSIVE METABOLIC PANEL
BUN: 34 mg/dL — ABNORMAL HIGH (ref 6–23)
Calcium: 9.4 mg/dL (ref 8.4–10.5)
Creatinine, Ser: 2.12 mg/dL — ABNORMAL HIGH (ref 0.50–1.10)
GFR calc Af Amer: 23 mL/min — ABNORMAL LOW (ref >90)
Glucose, Bld: 242 mg/dL — ABNORMAL HIGH (ref 70–99)
Sodium: 142 mEq/L (ref 135–145)
Total Protein: 6.5 g/dL (ref 6.0–8.3)

## 2012-07-23 LAB — CBC WITH DIFFERENTIAL/PLATELET
Basophils Absolute: 0.1 10*3/uL (ref 0.0–0.1)
Basophils Relative: 1 % (ref 0–1)
Hemoglobin: 10.3 g/dL — ABNORMAL LOW (ref 12.0–15.0)
MCHC: 33.2 g/dL (ref 30.0–36.0)
Monocytes Relative: 6 % (ref 3–12)
Neutro Abs: 4.8 10*3/uL (ref 1.7–7.7)
Neutrophils Relative %: 68 % (ref 43–77)

## 2012-07-23 LAB — POCT I-STAT, CHEM 8
BUN: 32 mg/dL — ABNORMAL HIGH (ref 6–23)
Calcium, Ion: 1.27 mmol/L (ref 1.13–1.30)
Creatinine, Ser: 2 mg/dL — ABNORMAL HIGH (ref 0.50–1.10)
Glucose, Bld: 240 mg/dL — ABNORMAL HIGH (ref 70–99)
Hemoglobin: 10.9 g/dL — ABNORMAL LOW (ref 12.0–15.0)
TCO2: 30 mmol/L (ref 0–100)

## 2012-07-23 LAB — PROTIME-INR: Prothrombin Time: 13.4 seconds (ref 11.6–15.2)

## 2012-07-23 LAB — GLUCOSE, CAPILLARY: Glucose-Capillary: 212 mg/dL — ABNORMAL HIGH (ref 70–99)

## 2012-07-23 LAB — TROPONIN I: Troponin I: <0.3 ng/mL (ref <0.30)

## 2012-07-23 LAB — ETHANOL: Alcohol, Ethyl (B): <11 mg/dL (ref 0–11)

## 2012-07-23 LAB — TYPE AND SCREEN

## 2012-07-23 LAB — ABO/RH: ABO/RH(D): B POS

## 2012-07-23 SURGERY — CRANIOTOMY HEMATOMA EVACUATION SUBDURAL
Anesthesia: General | Site: Head | Laterality: Right | Wound class: Clean

## 2012-07-23 MED ORDER — LIDOCAINE HCL (CARDIAC) 20 MG/ML IV SOLN
INTRAVENOUS | Status: DC | PRN
  Administered 2012-07-23: 100 mg via INTRAVENOUS

## 2012-07-23 MED ORDER — 0.9 % SODIUM CHLORIDE (POUR BTL) OPTIME
TOPICAL | Status: DC | PRN
  Administered 2012-07-23 (×3): 1000 mL

## 2012-07-23 MED ORDER — HYDROXYZINE HCL 50 MG/ML IM SOLN
50.0000 mg | Freq: Four times a day (QID) | INTRAMUSCULAR | Status: DC | PRN
  Filled 2012-07-23: qty 1

## 2012-07-23 MED ORDER — HYDROMORPHONE HCL PF 1 MG/ML IJ SOLN: 0.2500 mg | INTRAMUSCULAR | Status: DC | PRN

## 2012-07-23 MED ORDER — SUCCINYLCHOLINE CHLORIDE 20 MG/ML IJ SOLN
INTRAMUSCULAR | Status: DC | PRN
  Administered 2012-07-23: 100 mg via INTRAVENOUS

## 2012-07-23 MED ORDER — ROCURONIUM BROMIDE 100 MG/10ML IV SOLN
INTRAVENOUS | Status: DC | PRN
  Administered 2012-07-23: 20 mg via INTRAVENOUS

## 2012-07-23 MED ORDER — SODIUM CHLORIDE 0.9 % IV SOLN
500.0000 mg | INTRAVENOUS | Status: AC
  Administered 2012-07-23: 500 mg via INTRAVENOUS
  Filled 2012-07-23: qty 5

## 2012-07-23 MED ORDER — HEMOSTATIC AGENTS (NO CHARGE) OPTIME
TOPICAL | Status: DC | PRN
  Administered 2012-07-23: 1 via TOPICAL

## 2012-07-23 MED ORDER — INSULIN ASPART 100 UNIT/ML ~~LOC~~ SOLN
0.0000 [IU] | SUBCUTANEOUS | Status: DC
Start: 2012-07-24 — End: 2012-07-30
  Administered 2012-07-24: 2 [IU] via SUBCUTANEOUS
  Administered 2012-07-24: 5 [IU] via SUBCUTANEOUS
  Administered 2012-07-24 (×2): 2 [IU] via SUBCUTANEOUS
  Administered 2012-07-25 – 2012-07-26 (×4): 3 [IU] via SUBCUTANEOUS
  Administered 2012-07-26: 2 [IU] via SUBCUTANEOUS
  Administered 2012-07-26: 5 [IU] via SUBCUTANEOUS
  Administered 2012-07-27: 3 [IU] via SUBCUTANEOUS
  Administered 2012-07-27: 5 [IU] via SUBCUTANEOUS
  Administered 2012-07-28: 3 [IU] via SUBCUTANEOUS
  Administered 2012-07-28: 2 [IU] via SUBCUTANEOUS
  Administered 2012-07-28 – 2012-07-29 (×3): 3 [IU] via SUBCUTANEOUS
  Administered 2012-07-29: 5 [IU] via SUBCUTANEOUS
  Administered 2012-07-29: 3 [IU] via SUBCUTANEOUS
  Administered 2012-07-30: 2 [IU] via SUBCUTANEOUS
  Administered 2012-07-30: 3 [IU] via SUBCUTANEOUS

## 2012-07-23 MED ORDER — MAGNESIUM HYDROXIDE 400 MG/5ML PO SUSP: 30.0000 mL | Freq: Every day | ORAL | Status: DC | PRN

## 2012-07-23 MED ORDER — BISACODYL 10 MG RE SUPP
10.0000 mg | Freq: Every day | RECTAL | Status: DC | PRN
  Administered 2012-07-29: 10 mg via RECTAL
  Filled 2012-07-23: qty 1

## 2012-07-23 MED ORDER — DEXTROSE 5 % IV SOLN
2.0000 g | INTRAVENOUS | Status: DC
  Filled 2012-07-23: qty 2

## 2012-07-23 MED ORDER — HYDRALAZINE HCL 20 MG/ML IJ SOLN
5.0000 mg | INTRAMUSCULAR | Status: DC | PRN
  Administered 2012-07-24 – 2012-07-25 (×6): 10 mg via INTRAVENOUS
  Filled 2012-07-23 (×6): qty 1

## 2012-07-23 MED ORDER — SODIUM CHLORIDE 0.9 % IV SOLN
500.0000 mg | Freq: Two times a day (BID) | INTRAVENOUS | Status: DC
  Administered 2012-07-24 – 2012-07-26 (×5): 500 mg via INTRAVENOUS
  Filled 2012-07-23 (×6): qty 5

## 2012-07-23 MED ORDER — DEXTROSE 5 % IV SOLN
2.0000 g | INTRAVENOUS | Status: DC | PRN
  Administered 2012-07-23: 2 g via INTRAVENOUS

## 2012-07-23 MED ORDER — SODIUM CHLORIDE 0.9 % IV SOLN
INTRAVENOUS | Status: DC
  Administered 2012-07-23 – 2012-07-24 (×2): via INTRAVENOUS
  Administered 2012-07-26: 1000 mL via INTRAVENOUS

## 2012-07-23 MED ORDER — ONDANSETRON HCL 4 MG/2ML IJ SOLN: 4.0000 mg | INTRAMUSCULAR | Status: DC | PRN

## 2012-07-23 MED ORDER — HYDROMORPHONE HCL PF 1 MG/ML IJ SOLN
INTRAMUSCULAR | Status: AC
  Filled 2012-07-23: qty 1

## 2012-07-23 MED ORDER — MORPHINE SULFATE 2 MG/ML IJ SOLN
1.0000 mg | INTRAMUSCULAR | Status: DC | PRN
  Administered 2012-07-24: 2 mg via INTRAVENOUS
  Administered 2012-07-24 (×2): 1 mg via INTRAVENOUS
  Administered 2012-07-24: 2 mg via INTRAVENOUS
  Administered 2012-07-24 (×2): 1 mg via INTRAVENOUS
  Administered 2012-07-25 (×2): 2 mg via INTRAVENOUS
  Filled 2012-07-23 (×8): qty 1

## 2012-07-23 MED ORDER — MEPERIDINE HCL 50 MG/ML IJ SOLN
INTRAMUSCULAR | Status: AC
  Administered 2012-07-23: 12.5 mg
  Filled 2012-07-23: qty 1

## 2012-07-23 MED ORDER — ONDANSETRON HCL 4 MG/2ML IJ SOLN
INTRAMUSCULAR | Status: DC | PRN
  Administered 2012-07-23: 4 mg via INTRAVENOUS

## 2012-07-23 MED ORDER — ONDANSETRON HCL 4 MG/2ML IJ SOLN: 4.0000 mg | Freq: Once | INTRAMUSCULAR | Status: DC | PRN

## 2012-07-23 MED ORDER — PANTOPRAZOLE SODIUM 40 MG IV SOLR
40.0000 mg | Freq: Every day | INTRAVENOUS | Status: DC
  Administered 2012-07-24 – 2012-07-25 (×3): 40 mg via INTRAVENOUS
  Filled 2012-07-23 (×5): qty 40

## 2012-07-23 MED ORDER — GLYCOPYRROLATE 0.2 MG/ML IJ SOLN
INTRAMUSCULAR | Status: DC | PRN
  Administered 2012-07-23: 0.4 mg via INTRAVENOUS

## 2012-07-23 MED ORDER — THROMBIN 5000 UNITS EX KIT
PACK | CUTANEOUS | Status: DC | PRN
  Administered 2012-07-23 (×2): 5000 [IU] via TOPICAL

## 2012-07-23 MED ORDER — LABETALOL HCL 5 MG/ML IV SOLN
10.0000 mg | INTRAVENOUS | Status: DC | PRN
  Administered 2012-07-24 (×2): 20 mg via INTRAVENOUS
  Administered 2012-07-24: 10 mg via INTRAVENOUS
  Administered 2012-07-24 – 2012-07-25 (×3): 20 mg via INTRAVENOUS
  Filled 2012-07-23 (×4): qty 4

## 2012-07-23 MED ORDER — NEOSTIGMINE METHYLSULFATE 1 MG/ML IJ SOLN
INTRAMUSCULAR | Status: DC | PRN
  Administered 2012-07-23: 3 mg via INTRAVENOUS

## 2012-07-23 MED ORDER — METHYLENE BLUE 1 % INJ SOLN
INTRAMUSCULAR | Status: DC | PRN
  Administered 2012-07-23: 10 mL

## 2012-07-23 MED ORDER — AMLODIPINE BESYLATE 10 MG PO TABS
10.0000 mg | ORAL_TABLET | Freq: Every day | ORAL | Status: DC
  Administered 2012-07-26 – 2012-07-30 (×5): 10 mg via ORAL
  Filled 2012-07-23 (×7): qty 1

## 2012-07-23 MED ORDER — LIDOCAINE HCL (CARDIAC) 20 MG/ML IV SOLN: INTRAVENOUS | Status: DC | PRN

## 2012-07-23 MED ORDER — PROPOFOL 10 MG/ML IV BOLUS
INTRAVENOUS | Status: DC | PRN
  Administered 2012-07-23: 100 mg via INTRAVENOUS

## 2012-07-23 MED ORDER — LACTATED RINGERS IV SOLN
INTRAVENOUS | Status: DC | PRN
  Administered 2012-07-23: 21:00:00 via INTRAVENOUS

## 2012-07-23 MED ORDER — ONDANSETRON HCL 4 MG PO TABS: 4.0000 mg | ORAL_TABLET | ORAL | Status: DC | PRN

## 2012-07-23 MED ORDER — ATORVASTATIN CALCIUM 40 MG PO TABS
40.0000 mg | ORAL_TABLET | Freq: Every evening | ORAL | Status: DC
  Administered 2012-07-25 – 2012-07-29 (×4): 40 mg via ORAL
  Filled 2012-07-23 (×8): qty 1

## 2012-07-23 MED ORDER — SODIUM CHLORIDE 0.9 % IR SOLN
Status: DC | PRN
  Administered 2012-07-23: 21:00:00

## 2012-07-23 MED ORDER — LIDOCAINE-EPINEPHRINE 1 %-1:100000 IJ SOLN
INTRAMUSCULAR | Status: DC | PRN
  Administered 2012-07-23: 10 mL

## 2012-07-23 MED ORDER — PROPOFOL 10 MG/ML IV BOLUS: INTRAVENOUS | Status: DC | PRN

## 2012-07-23 MED ORDER — LABETALOL HCL 5 MG/ML IV SOLN
INTRAVENOUS | Status: DC | PRN
  Administered 2012-07-23 (×4): 10 mg via INTRAVENOUS

## 2012-07-23 MED ORDER — SODIUM CHLORIDE 0.9 % IV SOLN
INTRAVENOUS | Status: DC | PRN
  Administered 2012-07-23: 20:00:00 via INTRAVENOUS

## 2012-07-23 MED ORDER — CLONIDINE HCL 0.3 MG/24HR TD PTWK
0.3000 mg | MEDICATED_PATCH | TRANSDERMAL | Status: DC
  Administered 2012-07-24: 0.3 mg via TRANSDERMAL
  Filled 2012-07-23 (×2): qty 1

## 2012-07-23 MED ORDER — BUPIVACAINE HCL 0.5 % IJ SOLN
INTRAMUSCULAR | Status: DC | PRN
  Administered 2012-07-23: 10 mL

## 2012-07-23 MED ORDER — CEFAZOLIN SODIUM-DEXTROSE 2-3 GM-% IV SOLR
2.0000 g | INTRAVENOUS | Status: DC
  Filled 2012-07-23: qty 50

## 2012-07-23 MED ORDER — FENTANYL CITRATE 0.05 MG/ML IJ SOLN
INTRAMUSCULAR | Status: DC | PRN
  Administered 2012-07-23 (×2): 50 ug via INTRAVENOUS

## 2012-07-23 MED ORDER — CARVEDILOL 25 MG PO TABS
25.0000 mg | ORAL_TABLET | Freq: Two times a day (BID) | ORAL | Status: DC
  Administered 2012-07-25 – 2012-07-30 (×10): 25 mg via ORAL
  Filled 2012-07-23 (×17): qty 1

## 2012-07-23 MED ORDER — IRBESARTAN 300 MG PO TABS
300.0000 mg | ORAL_TABLET | Freq: Every day | ORAL | Status: DC
  Administered 2012-07-26 – 2012-07-30 (×5): 300 mg via ORAL
  Filled 2012-07-23 (×7): qty 1

## 2012-07-23 SURGICAL SUPPLY — 77 items
APPLICATOR COTTON TIP 6IN STRL (MISCELLANEOUS) ×2 IMPLANT
BAG DECANTER FOR FLEXI CONT (MISCELLANEOUS) ×2 IMPLANT
BANDAGE GAUZE 4  KLING STR (GAUZE/BANDAGES/DRESSINGS) ×2 IMPLANT
BANDAGE GAUZE ELAST BULKY 4 IN (GAUZE/BANDAGES/DRESSINGS) ×4 IMPLANT
BIT DRILL WIRE PASS 1.3MM (BIT) IMPLANT
BRUSH SCRUB EZ PLAIN DRY (MISCELLANEOUS) ×2 IMPLANT
BUR ACORN 6.0 PRECISION (BURR) ×2 IMPLANT
BUR ROUTER D-58 CRANI (BURR) ×1 IMPLANT
CANISTER SUCTION 2500CC (MISCELLANEOUS) ×2 IMPLANT
CLIP TI MEDIUM 6 (CLIP) ×2 IMPLANT
CLOTH BEACON ORANGE TIMEOUT ST (SAFETY) ×2 IMPLANT
CONT SPEC 4OZ CLIKSEAL STRL BL (MISCELLANEOUS) ×3 IMPLANT
CORDS BIPOLAR (ELECTRODE) ×2 IMPLANT
DRAIN PENROSE 1/2X12 LTX STRL (WOUND CARE) IMPLANT
DRAIN SNY WOU 7FLT (WOUND CARE) IMPLANT
DRAPE NEUROLOGICAL W/INCISE (DRAPES) ×2 IMPLANT
DRAPE SURG 17X23 STRL (DRAPES) IMPLANT
DRAPE SURG IRRIG POUCH 19X23 (DRAPES) IMPLANT
DRAPE WARM FLUID 44X44 (DRAPE) ×2 IMPLANT
DRILL WIRE PASS 1.3MM (BIT)
DRSG ADAPTIC 3X8 NADH LF (GAUZE/BANDAGES/DRESSINGS) ×2 IMPLANT
DRSG PAD ABDOMINAL 8X10 ST (GAUZE/BANDAGES/DRESSINGS) IMPLANT
ELECT CAUTERY BLADE 6.4 (BLADE) ×1 IMPLANT
ELECT REM PT RETURN 9FT ADLT (ELECTROSURGICAL) ×2
ELECTRODE REM PT RTRN 9FT ADLT (ELECTROSURGICAL) ×1 IMPLANT
EVACUATOR SILICONE 100CC (DRAIN) IMPLANT
GAUZE SPONGE 4X4 16PLY XRAY LF (GAUZE/BANDAGES/DRESSINGS) IMPLANT
GLOVE BIO SURGEON STRL SZ 6.5 (GLOVE) ×1 IMPLANT
GLOVE BIOGEL PI IND STRL 6.5 (GLOVE) IMPLANT
GLOVE BIOGEL PI IND STRL 8 (GLOVE) ×1 IMPLANT
GLOVE BIOGEL PI INDICATOR 6.5 (GLOVE) ×1
GLOVE BIOGEL PI INDICATOR 8 (GLOVE) ×1
GLOVE ECLIPSE 7.5 STRL STRAW (GLOVE) ×2 IMPLANT
GLOVE EXAM NITRILE LRG STRL (GLOVE) IMPLANT
GLOVE EXAM NITRILE MD LF STRL (GLOVE) IMPLANT
GLOVE EXAM NITRILE XL STR (GLOVE) IMPLANT
GLOVE EXAM NITRILE XS STR PU (GLOVE) IMPLANT
GLOVE INDICATOR 7.0 STRL GRN (GLOVE) ×1 IMPLANT
GLOVE SURG SS PI 7.0 STRL IVOR (GLOVE) ×1 IMPLANT
GOWN BRE IMP SLV AUR LG STRL (GOWN DISPOSABLE) ×1 IMPLANT
GOWN BRE IMP SLV AUR XL STRL (GOWN DISPOSABLE) ×1 IMPLANT
GOWN STRL REIN 2XL LVL4 (GOWN DISPOSABLE) IMPLANT
HEMOSTAT SURGICEL 2X14 (HEMOSTASIS) ×2 IMPLANT
HOOK DURA (MISCELLANEOUS) ×2 IMPLANT
KIT BASIN OR (CUSTOM PROCEDURE TRAY) ×2 IMPLANT
KIT ROOM TURNOVER OR (KITS) ×2 IMPLANT
NDL SPNL 22GX3.5 QUINCKE BK (NEEDLE) ×1 IMPLANT
NEEDLE SPNL 22GX3.5 QUINCKE BK (NEEDLE) ×2 IMPLANT
NS IRRIG 1000ML POUR BTL (IV SOLUTION) ×4 IMPLANT
PACK CRANIOTOMY (CUSTOM PROCEDURE TRAY) ×2 IMPLANT
PAD ARMBOARD 7.5X6 YLW CONV (MISCELLANEOUS) ×4 IMPLANT
PATTIES SURGICAL .5 X.5 (GAUZE/BANDAGES/DRESSINGS) IMPLANT
PATTIES SURGICAL .5 X3 (DISPOSABLE) IMPLANT
PATTIES SURGICAL 1/4 X 3 (GAUZE/BANDAGES/DRESSINGS) ×1 IMPLANT
PATTIES SURGICAL 1X1 (DISPOSABLE) IMPLANT
PIN MAYFIELD SKULL DISP (PIN) IMPLANT
PLATE 1.5  2HOLE MED NEURO (Plate) ×2 IMPLANT
PLATE 1.5 2HOLE MED NEURO (Plate) IMPLANT
PLATE 1.5 5HOLE SQUARE (Plate) ×2 IMPLANT
SCREW SELF DRILL HT 1.5/4MM (Screw) ×10 IMPLANT
SPECIMEN JAR SMALL (MISCELLANEOUS) ×1 IMPLANT
SPONGE GAUZE 4X4 12PLY (GAUZE/BANDAGES/DRESSINGS) ×2 IMPLANT
SPONGE NEURO XRAY DETECT 1X3 (DISPOSABLE) IMPLANT
SPONGE SURGIFOAM ABS GEL 100 (HEMOSTASIS) ×2 IMPLANT
STAPLER SKIN PROX WIDE 3.9 (STAPLE) ×2 IMPLANT
SUT ETHILON 3 0 FSL (SUTURE) IMPLANT
SUT NURALON 4 0 TR CR/8 (SUTURE) ×4 IMPLANT
SUT VIC AB 2-0 CP2 18 (SUTURE) ×4 IMPLANT
SYR 20ML ECCENTRIC (SYRINGE) ×2 IMPLANT
SYR CONTROL 10ML LL (SYRINGE) ×2 IMPLANT
TOWEL OR 17X24 6PK STRL BLUE (TOWEL DISPOSABLE) ×2 IMPLANT
TOWEL OR 17X26 10 PK STRL BLUE (TOWEL DISPOSABLE) ×2 IMPLANT
TRAP SPECIMEN MUCOUS 40CC (MISCELLANEOUS) IMPLANT
TRAY FOLEY CATH 14FRSI W/METER (CATHETERS) ×1 IMPLANT
TRAY FOLEY CATH 16FRSI W/METER (SET/KITS/TRAYS/PACK) IMPLANT
UNDERPAD 30X30 INCONTINENT (UNDERPADS AND DIAPERS) IMPLANT
WATER STERILE IRR 1000ML POUR (IV SOLUTION) ×2 IMPLANT

## 2012-07-23 NOTE — Progress Notes (Signed)
Subjective: Patient resting in recovery room, comfortable.  Objective: Vital signs in last 24 hours: Filed Vitals:   07/23/12 1824 07/23/12 1830 07/23/12 1900 07/23/12 2145  BP: 191/78 198/76 181/86 152/83  Pulse: 85 87 84 72  Temp:    97.3 F (36.3 C)  TempSrc:      Resp: 17 19 23 13   SpO2: 98% 99% 99% 100%    Intake/Output from previous day:   Intake/Output this shift: Total I/O In: 1200 [I.V.:1200] Out: 300 [Urine:300]  Physical Exam:  Opening eyes spontaneously. Following commands. Oriented to name, and [place], but not to  month or year. following commands. Moving all 4 extremities, paretic on the left side, lower extremity weaker than upper extremity.  CBC  Recent Labs  07/23/12 1656 07/23/12 1724  WBC 7.0  --   HGB 10.3* 10.9*  HCT 31.0* 32.0*  PLT 335  --    BMET  Recent Labs  07/23/12 1657 07/23/12 1724  NA 142 142  K 4.0 3.8  CL 106 106  CO2 25  --   GLUCOSE 242* 240*  BUN 34* 32*  CREATININE 2.12* 2.00*  CALCIUM 9.4  --     Studies/Results: Ct Head Wo Contrast  07/23/2012  *RADIOLOGY REPORT*  Clinical Data: Altered mental status.  Left-sided weakness.  CT HEAD WITHOUT CONTRAST  Technique:  Contiguous axial images were obtained from the base of the skull through the vertex without contrast.  Comparison: CT 06/20/2012  Findings: Large mixed density subdural hematoma on the right has increased significantly since the prior study.  There is low density fluid anteriorly and   high density blood posteriorly.  The fluid collection measures approximate 14 mm.  There is mass effect on the right hemisphere with 13 mm midline shift.  The left lateral ventricle is   dilated and there is periventricular white matter edema in the left parietal white matter.  Negative for mass or acute infarct.  IMPRESSION: Large mixed density subdural hematoma on the right with 13 mm midline shift.  There is enlargement of the left lateral ventricle.  Critical Value/emergent  results were called by telephone at the time of interpretation on 07/23/2012 at 1807 hours to Dr. Lynelle Doctor, who verbally acknowledged these results.   Original Report Authenticated By: Janeece Riggers, M.D.     Assessment/Plan: Stable following craniotomy. Have spoken with the patient's son and daughter (by phone) about condition, surgical findings, and postoperative condition in the recovery room. Their questions were answered.   [name], MD 07/23/2012, 10:04 PM

## 2012-07-23 NOTE — Anesthesia Preprocedure Evaluation (Addendum)
Anesthesia Evaluation  Patient identified by MRN, date of birth, ID band Patient awake    Reviewed: Allergy & Precautions, H&P , NPO status , Patient's Chart, lab work & pertinent test results, reviewed documented beta blocker date and time   Airway Mallampati: I TM Distance: >3 FB Neck ROM: full    Dental  (+) Poor Dentition, Missing and Chipped   Pulmonary          Cardiovascular hypertension, Pt. on medications and Pt. on home beta blockers Rhythm:regular Rate:Tachycardia     Neuro/Psych PSYCHIATRIC DISORDERS    GI/Hepatic   Endo/Other  diabetes, Type 2, Insulin Dependent  Renal/GU CRFRenal disease     Musculoskeletal   Abdominal   Peds  Hematology   Anesthesia Other Findings Subdural Hem.  Reproductive/Obstetrics                          Anesthesia Physical Anesthesia Plan  ASA: III and emergent  Anesthesia Plan: General   Post-op Pain Management:    Induction: Intravenous  Airway Management Planned: Oral ETT  Additional Equipment:   Intra-op Plan:   Post-operative Plan: Possible Post-op intubation/ventilation  Informed Consent: I have reviewed the patients History and Physical, chart, labs and discussed the procedure including the risks, benefits and alternatives for the proposed anesthesia with the patient or authorized representative who has indicated his/her understanding and acceptance.     Plan Discussed with: CRNA, Anesthesiologist and Surgeon  Anesthesia Plan Comments:        Anesthesia Quick Evaluation

## 2012-07-23 NOTE — ED Notes (Addendum)
Pt discharged from [facility], most recently 1 week ago.  Since discharge son is taking care of his mother and states she is no longer able to walk, feed or toilet herself.  Son brought pt in because he attempted to wake her up and she did not respond.  States she is doing much better since arriving at hospital.  Pt is arousable and obeys commands.  CBG 220.  (Triage notes charted by this RN under EMT Grant's name).

## 2012-07-23 NOTE — Op Note (Signed)
07/23/2012  9:36 PM  PATIENT:  April [name]  79 y.o. female  PRE-OPERATIVE DIAGNOSIS:  right hemispheric chronic subdural hematoma  POST-OPERATIVE DIAGNOSIS:  right hemispheric subacute and chronic subdural hematoma  PROCEDURE:  Procedure(s): CRANIOTOMY HEMATOMA EVACUATION SUBDURAL:  Right frontoparietal craniotomy, evacuation of subdural hematoma    SURGEON:  Surgeon(s): [name], MD  ANESTHESIA:   general  EBL:  Total I/O In: 1000 [I.V.:1000] Out: 300 [Urine:300]  BLOOD ADMINISTERED:none  COUNT:  Correct per nursing staff   DRAINS: (10 mm) Jackson-Pratt drain(s) with closed bulb suction in the Subdural space   SPECIMEN:  Source of Specimen:  Subdural membrane  DICTATION: Patient was brought to the operating room, placed under general endotracheal anesthesia. The scalp was shaved, and the patient was positioned in a horseshoe head rest, with a roll behind the right shoulder, and the head gently turned to the left. The scalp was prepped with Betadine soap and solution and draped in a sterile fashion. A straight frontal parietal incision was made over the frontal parietal boss. The line of incision was infiltrated locally with epinephrine, Raney clips were applied to the scalp edges to maintain hemostasis. The temporalis fascia and muscle was elevated, and then a single bur hole was made in the temporal region. The dura was dissected from the overlying skull. Then using the craniotome attachment return to bone flap, there was set aside in a saline soaked sponge. The dura was tacked up around the margins of the craniotomy with 4-0 Nurolon suture. The dura soap and then a U-shaped fashion hinged towards the midline. There is a moderately thick parietal subdural membrane there was removed and sent to pathology as a specimen. We then gently irrigated the subdural space with warm saline, irrigating away the varying ages of subdural fluid, portions of which had a motor oil appearance  other portions of which had dark red appearance suggestive more specifically of portions of a more chronic component and other portions that were of a subacute nature. The parietal membrane was tacked to the edge of the dura with hemoclips. Bridging septations between the visceral and parietal membrane were coagulated and divided.  The visceral membrane was gently irrigated off of the brain surface, tacked up to the edge of the dura with hemoclips as feasible, and opened and coagulated as necessary. We then further irrigated the subdural space with warm saline, until clear. A flat 10 mm Jackson-Pratt drain was prepped with separate stab incision, and placed in the subdural space. We then approximated dura with interrupted 4-0 Nurolon suture. The bone flap was secured to the skull with a variety of Lorenz cranial plates with 4 mm self drilling screws. We then proceeded with scalp closure. The galea was closed with interrupted inverted 2-0 Vicryl sutures. Skin edges were approximated surgical staples. The wounds dressed with Adaptic, sterile gauze, 2 Curlex, and 2 clings. Following surgery the patient was reversed and the anesthetic, extubated, transferred to the recovery room for further care. As the blood loss was less than 50 cc.   PLAN OF CARE: Admit to inpatient   PATIENT DISPOSITION:  PACU - hemodynamically stable.   Delay start of Pharmacological VTE agent (>24hrs) due to surgical blood loss or risk of bleeding:  yes

## 2012-07-23 NOTE — ED Provider Notes (Signed)
History     CSN: [id number]  Arrival date & time 07/23/12  1618   First MD Initiated Contact with Patient 07/23/12 1642      CC: Confused, weakness  HPI Pt presents to the ED because her son found her to be weak and confused this am.  She was recently discharged from the hospital a week ago.  Last night she was at her baseline since discharge but this am she is unable to get up out of bed.  By report, she has been able to walk prior to this am.  Pt complains of weakness mostly on the left side.  She denies nausea, vomiting, chest pain, abdominal pain or any other pain elsewhere. Past Medical History  Diagnosis Date  . Diabetes mellitus   . Hypertension   . Hyperlipidemia   . Pneumonia   . Subdural hematoma   . Arthritis   . DVT (deep venous thrombosis)   . Incontinence   . Renal disorder     chronic kidney dz stage III    Past Surgical History  Procedure Laterality Date  . No past surgeries    . Insertion of vena cava filter      Family History  Problem Relation Age of Onset  . Diabetes Mellitus II Son     History  Substance Use Topics  . Smoking status: Never Smoker   . Smokeless tobacco: Never Used  . Alcohol Use: No    OB History   Grav Para Term Preterm Abortions TAB SAB Ect Mult Living                  Review of Systems  All other systems reviewed and are negative.    Allergies  Review of patient's allergies indicates no known allergies.  Home Medications   Current Outpatient Rx  Name  Route  Sig  Dispense  Refill  . amLODipine (NORVASC) 10 MG tablet   Oral   Take 1 tablet (10 mg total) by mouth daily.   30 tablet   1   . atorvastatin (LIPITOR) 40 MG tablet   Oral   Take 40 mg by mouth every evening.         . carvedilol (COREG) 25 MG tablet   Oral   Take 1 tablet (25 mg total) by mouth 2 (two) times daily with a meal.   60 tablet   3   . cloNIDine (CATAPRES - DOSED IN MG/24 HR) 0.3 mg/24hr   Transdermal   Place 1 patch (0.3 mg  total) onto the skin once a week.   4 patch   2   . insulin glargine (LANTUS) 100 UNIT/ML injection   Subcutaneous   Inject 20 Units into the skin daily.         . sitaGLIPtin (JANUVIA) 50 MG tablet   Oral   Take 1 tablet (50 mg total) by mouth daily.   30 tablet   1   . telmisartan (MICARDIS) 80 MG tablet   Oral   Take 1 tablet (80 mg total) by mouth daily.   30 tablet   3     BP 181/79  Pulse 82  Temp(Src) 98.6 F (37 C) (Oral)  Resp 16  SpO2 100%  Physical Exam  Nursing note and vitals reviewed. Constitutional: She appears well-developed and well-nourished. No distress.  HENT:  Head: Normocephalic and atraumatic.  Right Ear: External ear normal.  Left Ear: External ear normal.  Mouth/Throat: Oropharynx is clear and moist.  Eyes: Conjunctivae are normal. Right eye exhibits no discharge. Left eye exhibits no discharge. No scleral icterus.  Neck: Neck supple. No tracheal deviation present.  Cardiovascular: Normal rate, regular rhythm and intact distal pulses.   Pulmonary/Chest: Effort normal and breath sounds normal. No stridor. No respiratory distress. She has no wheezes. She has no rales.  Abdominal: Soft. Bowel sounds are normal. She exhibits no distension. There is no tenderness. There is no rebound and no guarding.  Musculoskeletal: She exhibits no edema and no tenderness.  Neurological: She is alert. She is disoriented (to the date). She displays tremor. A cranial nerve deficit (Questionable left-sided facial droop) is present. No sensory deficit. She exhibits normal muscle tone. She displays no seizure activity. Coordination abnormal.  General weakness all 4 extremities, weak grip strength bilaterally, left arm appears to be slightly contracted up against the chest although she is able to lift it off the bed, sensation intact in all extremities,   Skin: Skin is warm and dry. No rash noted.  Psychiatric: She has a normal mood and affect.    ED Course   Procedures (including critical care time) EKG Normal sinus rhythm, rate 83 Cannot rule out inferior infarct, age undetermined anteroseptal infarct, age undetermined No prior EKG for comparison  CRITICAL CARE Performed by: Celene Kras Total critical care time: 35 Critical care time was exclusive of separately billable procedures and treating other patients. Critical care was necessary to treat or prevent imminent or life-threatening deterioration. Critical care was time spent personally by me on the following activities: development of treatment plan with patient and/or surrogate as well as nursing, discussions with consultants, evaluation of patient's response to treatment, examination of patient, obtaining history from patient or surrogate, ordering and performing treatments and interventions, ordering and review of laboratory studies, ordering and review of radiographic studies, pulse oximetry and re-evaluation of patient's condition.   Labs Reviewed  CBC WITH DIFFERENTIAL - Abnormal; Notable for the following:    Hemoglobin 10.3 (*)    HCT 31.0 (*)    RDW 15.7 (*)    All other components within normal limits  GLUCOSE, CAPILLARY - Abnormal; Notable for the following:    Glucose-Capillary 220 (*)    All other components within normal limits  COMPREHENSIVE METABOLIC PANEL - Abnormal; Notable for the following:    Glucose, Bld 242 (*)    BUN 34 (*)    Creatinine, Ser 2.12 (*)    Albumin 2.5 (*)    GFR calc non Af Amer 20 (*)    GFR calc Af Amer 23 (*)    All other components within normal limits  POCT I-STAT, CHEM 8 - Abnormal; Notable for the following:    BUN 32 (*)    Creatinine, Ser 2.00 (*)    Glucose, Bld 240 (*)    Hemoglobin 10.9 (*)    HCT 32.0 (*)    All other components within normal limits  ETHANOL  PROTIME-INR  APTT  TROPONIN I  URINE RAPID DRUG SCREEN (HOSP PERFORMED)  URINALYSIS, ROUTINE W REFLEX MICROSCOPIC  POCT I-STAT TROPONIN I   Ct Head Wo  Contrast  07/23/2012  *RADIOLOGY REPORT*  Clinical Data: Altered mental status.  Left-sided weakness.  CT HEAD WITHOUT CONTRAST  Technique:  Contiguous axial images were obtained from the base of the skull through the vertex without contrast.  Comparison: CT 06/20/2012  Findings: Large mixed density subdural hematoma on the right has increased significantly since the prior study.  There is low density  fluid anteriorly and   high density blood posteriorly.  The fluid collection measures approximate 14 mm.  There is mass effect on the right hemisphere with 13 mm midline shift.  The left lateral ventricle is   dilated and there is periventricular white matter edema in the left parietal white matter.  Negative for mass or acute infarct.  IMPRESSION: Large mixed density subdural hematoma on the right with 13 mm midline shift.  There is enlargement of the left lateral ventricle.  Critical Value/emergent results were called by telephone at the time of interpretation on 07/23/2012 at 1807 hours to Dr. Lynelle Doctor, who verbally acknowledged these results.   Original Report Authenticated By: Janeece Riggers, M.D.      1. Subdural hematoma       MDM  The patient has a  subdural hematoma with midline shift now.  The patient had a CT scan back in January 15 that showed a small lesion at that time. She was evaluated by Dr. Mikal Plane. The plan was for nonoperative management.    I have spoken with Dr Newell Coral.  Pt will require surgery.  She remains alert and is protecting her airway.  Will keep her NPO.          Celene Kras, MD 07/23/12 [phone]

## 2012-07-23 NOTE — H&P (Addendum)
Subjective: Patient is a 79 y.o. female who is admitted for treatment of large right hemispheric chronic subdural hematoma.  This is the third hospitalization for this patient over the past 5 weeks. Initial hospitalization was for pneumonia, increased renal insufficiency, and altered mental status, and she was found to have a moderate-sized right frontal mixed density subdural hematoma, with mild mass effect and no significant shift. Patient was seen in neurosurgical consultation by Dr. Coletta Memos we did not feel surgery was indicated. Patient was ultimately discharged, but was readmitted about 3 weeks ago because of bilateral lower extremity edema. She was apparently found to have evidence of a DVT, and an IVC filter was placed because of the history of a subdural hematoma. Patient again was discharged to home about 2-1/2 weeks ago, but was brought back to the hospital today by her son, who cares for her at home, because of increasing weakness on her left side, that has particularly worsened over the past 2 days. Patient was evaluated by Dr. Linwood Dibbles emergency room physician at [facility] emergency room. Dr. Lynelle Doctor obtained a CT scan of the brain without contrast which revealed a large right hemispheric chronic subdural hematoma, with significant mass effect, and midline shift. Neurosurgical consultation was requested.  Most history is obtained from the patient's son, however the patient denied significant headache, and also denied any nausea or vomiting. Her son notes that she's been weaker left side, it's been gradually worsening, but typically worsening over the past 2 days. He finds that she can no longer walk. He reports that she's not been able to feed herself recently.   Patient Active Problem List   Diagnosis Date Noted  . DVT (deep venous thrombosis) 07/04/2012  . CKD (chronic kidney disease) stage 3, GFR 30-59 ml/min 07/04/2012  . Lower extremity edema 07/03/2012  . Anemia 07/03/2012   . ARF (acute renal failure) 07/03/2012  . HTN (hypertension) 07/03/2012  . SDH (subdural hematoma) 06/21/2012  . Encephalopathy acute 06/20/2012  . Community acquired pneumonia 06/20/2012  . Diabetes mellitus 06/20/2012  . Hyperlipidemia 06/20/2012  . Hypertension 06/20/2012   Past Medical History  Diagnosis Date  . Diabetes mellitus   . Hypertension   . Hyperlipidemia   . Pneumonia   . Subdural hematoma   . Arthritis   . DVT (deep venous thrombosis)   . Incontinence   . Renal disorder     chronic kidney dz stage III    Past Surgical History  Procedure Laterality Date  . No past surgeries    . Insertion of vena cava filter       (Not in a hospital admission) No Known Allergies  History  Substance Use Topics  . Smoking status: Never Smoker   . Smokeless tobacco: Never Used  . Alcohol Use: No    Family History  Problem Relation Age of Onset  . Diabetes Mellitus II Son      Review of Systems A comprehensive review of systems was negative.  Objective: Vital signs in last 24 hours: Temp:  [98.6 F (37 C)] 98.6 F (37 C) (02/17 1622) Pulse Rate:  [81-87] 84 (02/17 1900) Resp:  [16-23] 23 (02/17 1900) BP: (167-198)/(71-86) 181/86 mmHg (02/17 1900) SpO2:  [98 %-100 %] 99 % (02/17 1900)  EXAM: Patient is an elderly black female, in no acute distress. Lungs are clear to auscultation , the patient has symmetrical respiratory excursion. Heart has a regular rate and rhythm normal S1 and S2 no murmur.  Abdomen is soft nontender nondistended bowel sounds are present. Extremity examination shows no clubbing or cyanosis, but there is moderate edema in the distal left lower extremity, and mild edema in the distal right lower extremity. Mental status shows the patient is oriented to name, but not to place, month, or year. She follows simple commands, and has limited speech, though it is clear. Cranial nerves show pupils are equal, round, and reactive to light, and about 2 mm  bilaterally. EOMI. Mild left facial weakness. Hearing is present. Palatal movement is symmetrical. Shoulder shrug is symmetrical. Motor examination shows a mild left hemiparesis, leg somewhat weaker than arm. Mild drift of the left upper extremity. Sensation is intact to pinprick throughout. Reflexes are diminished, but symmetrical. Gait and stance not tested due to the nature of her condition.   Data Review:CBC    Component Value Date/Time   WBC 7.0 07/23/2012 1656   RBC 3.87 07/23/2012 1656   HGB 10.9* 07/23/2012 1724   HCT 32.0* 07/23/2012 1724   PLT 335 07/23/2012 1656   MCV 80.1 07/23/2012 1656   MCH 26.6 07/23/2012 1656   MCHC 33.2 07/23/2012 1656   RDW 15.7* 07/23/2012 1656   LYMPHSABS 1.6 07/23/2012 1656   MONOABS 0.4 07/23/2012 1656   EOSABS 0.1 07/23/2012 1656   BASOSABS 0.1 07/23/2012 1656                          BMET    Component Value Date/Time   NA 142 07/23/2012 1724   K 3.8 07/23/2012 1724   CL 106 07/23/2012 1724   CO2 25 07/23/2012 1657   GLUCOSE 240* 07/23/2012 1724   BUN 32* 07/23/2012 1724   CREATININE 2.00* 07/23/2012 1724   CALCIUM 9.4 07/23/2012 1657   GFRNONAA 20* 07/23/2012 1657   GFRAA 23* 07/23/2012 1657     Assessment/Plan: Patient with large right hemispheric chronic subdural hematoma with significant mass effect, and midline shift, with increasing neurologic dysfunction. I discussed the situation with the patient's son, who is at her bedside, and reviewed her CT images with him. I've recommended craniotomy for evacuation of subdural hematoma. I discussed the nature of her condition and its inherent risks without treatment, I have also discussed with him the nature the surgical procedure, and its associated risks including risks of infection, bleeding, possibly for further surgery for evacuation of recurrent subdural hematoma, possibly for transfusion, the risk of neurologic dysfunction including altered mental status, paralysis, coma, and death, and anesthetic risks of  myocardial infarction, stroke, pneumonia, and death. Understanding all this the patient's son does want [place] to proceed with surgery, and she is admitted for such. Postoperatively she will be managed in the intensive care unit. The patient's and her son's questions were answered for them.   [name], MD 07/23/2012 7:32 PM And

## 2012-07-23 NOTE — Anesthesia Postprocedure Evaluation (Signed)
  Anesthesia Post-op Note  Patient: April [name]  Procedure(s) Performed: Procedure(s): CRANIOTOMY HEMATOMA EVACUATION SUBDURAL (Right)  Patient Location: PACU  Anesthesia Type:General  Level of Consciousness: sedated, patient cooperative, confused and lethargic  Airway and Oxygen Therapy: Patient Spontanous Breathing and Patient connected to nasal cannula oxygen  Post-op Pain: none  Post-op Assessment: Post-op Vital signs reviewed, Patient's Cardiovascular Status Stable, Respiratory Function Stable, Patent Airway, No signs of Nausea or vomiting and Pain level controlled  Post-op Vital Signs: stable  Complications: No apparent anesthesia complications

## 2012-07-23 NOTE — Transfer of Care (Signed)
Immediate Anesthesia Transfer of Care Note  Patient: April [name]  Procedure(s) Performed: Procedure(s): CRANIOTOMY HEMATOMA EVACUATION SUBDURAL (Right)  Patient Location: PACU  Anesthesia Type:General  Level of Consciousness: responds to stimulation  Airway & Oxygen Therapy: Patient Spontanous Breathing and Patient connected to nasal cannula oxygen  Post-op Assessment: Report given to PACU RN and Post -op Vital signs reviewed and stable  Post vital signs: Reviewed and stable  Complications: No apparent anesthesia complications

## 2012-07-23 NOTE — Anesthesia Procedure Notes (Signed)
Procedure Name: Intubation Date/Time: 07/23/2012 7:53 PM Performed by: Arlice Colt B Pre-anesthesia Checklist: Patient identified, Emergency Drugs available, Suction available, Patient being monitored and Timeout performed Patient Re-evaluated:Patient Re-evaluated prior to inductionOxygen Delivery Method: Circle system utilized Preoxygenation: Pre-oxygenation with 100% oxygen Intubation Type: IV induction and Rapid sequence Laryngoscope Size: Mac and 3 Grade View: Grade II Tube type: Subglottic suction tube Tube size: 7.5 mm Number of attempts: 1 Airway Equipment and Method: LTA kit utilized Secured at: 21 cm Tube secured with: Tape Dental Injury: Teeth and Oropharynx as per pre-operative assessment

## 2012-07-24 LAB — CBC WITH DIFFERENTIAL/PLATELET
Basophils Absolute: 0.1 10*3/uL (ref 0.0–0.1)
Basophils Relative: 1 % (ref 0–1)
Eosinophils Absolute: 0.1 10*3/uL (ref 0.0–0.7)
Eosinophils Relative: 1 % (ref 0–5)
HCT: 25.2 % — ABNORMAL LOW (ref 36.0–46.0)
Hemoglobin: 8.5 g/dL — ABNORMAL LOW (ref 12.0–15.0)
Lymphocytes Relative: 14 % (ref 12–46)
Lymphs Abs: 1.5 10*3/uL (ref 0.7–4.0)
MCH: 26.7 pg (ref 26.0–34.0)
MCHC: 33.7 g/dL (ref 30.0–36.0)
MCV: 79.2 fL (ref 78.0–100.0)
Monocytes Absolute: 1 10*3/uL (ref 0.1–1.0)
Monocytes Relative: 9 % (ref 3–12)
Neutro Abs: 8.3 10*3/uL — ABNORMAL HIGH (ref 1.7–7.7)
Neutrophils Relative %: 76 % (ref 43–77)
Platelets: 255 10*3/uL (ref 150–400)
RBC: 3.18 MIL/uL — ABNORMAL LOW (ref 3.87–5.11)
RDW: 15.8 % — ABNORMAL HIGH (ref 11.5–15.5)
WBC: 10.9 10*3/uL — ABNORMAL HIGH (ref 4.0–10.5)

## 2012-07-24 LAB — HEMOGLOBIN A1C
Hgb A1c MFr Bld: 12.3 % — ABNORMAL HIGH (ref <5.7)
Mean Plasma Glucose: 306 mg/dL — ABNORMAL HIGH (ref <117)

## 2012-07-24 LAB — GLUCOSE, CAPILLARY
Glucose-Capillary: 109 mg/dL — ABNORMAL HIGH (ref 70–99)
Glucose-Capillary: 121 mg/dL — ABNORMAL HIGH (ref 70–99)
Glucose-Capillary: 216 mg/dL — ABNORMAL HIGH (ref 70–99)
Glucose-Capillary: 124 mg/dL — ABNORMAL HIGH (ref 70–99)
Glucose-Capillary: 130 mg/dL — ABNORMAL HIGH (ref 70–99)
Glucose-Capillary: 105 mg/dL — ABNORMAL HIGH (ref 70–99)

## 2012-07-24 LAB — BASIC METABOLIC PANEL
BUN: 33 mg/dL — ABNORMAL HIGH (ref 6–23)
CO2: 24 mEq/L (ref 19–32)
Calcium: 8.5 mg/dL (ref 8.4–10.5)
Chloride: 111 mEq/L (ref 96–112)
Creatinine, Ser: 2.12 mg/dL — ABNORMAL HIGH (ref 0.50–1.10)
GFR calc Af Amer: 23 mL/min — ABNORMAL LOW (ref >90)
GFR calc non Af Amer: 20 mL/min — ABNORMAL LOW (ref >90)
Glucose, Bld: 149 mg/dL — ABNORMAL HIGH (ref 70–99)
Potassium: 3.3 mEq/L — ABNORMAL LOW (ref 3.5–5.1)
Sodium: 145 mEq/L (ref 135–145)

## 2012-07-24 LAB — MRSA PCR SCREENING: MRSA by PCR: NEGATIVE

## 2012-07-24 MED ORDER — KCL IN DEXTROSE-NACL 40-5-0.45 MEQ/L-%-% IV SOLN
INTRAVENOUS | Status: DC
  Administered 2012-07-24 – 2012-07-25 (×2): via INTRAVENOUS
  Administered 2012-07-26: 1000 mL via INTRAVENOUS
  Filled 2012-07-24 (×4): qty 1000

## 2012-07-24 NOTE — Progress Notes (Signed)
UR completed 

## 2012-07-24 NOTE — Progress Notes (Signed)
Subjective: Patient resting in bed, appears comfortable. A bit lethargic, but just given a dose of morphine IV. Watery bloody drainage into Jackson-Pratt drain.  Objective: Vital signs in last 24 hours: Filed Vitals:   07/24/12 0412 07/24/12 0500 07/24/12 0629 07/24/12 0700  BP:  167/55 177/55 149/49  Pulse:  76  83  Temp: 98.2 F (36.8 C)     TempSrc: Oral     Resp:  15  12  Height:      Weight:      SpO2:  99%  100%    Intake/Output from previous day: 02/17 0701 - 02/18 0700 In: 1270 [I.V.:1200] Out: 635 [Urine:635] Intake/Output this shift:    Physical Exam:  Opens eyes to voice, follows commands with all 4 extremities. Speech garbled. Moving right side better than left side.  CBC  Recent Labs  07/23/12 1656 07/23/12 1724 07/24/12 0435  WBC 7.0  --  10.9*  HGB 10.3* 10.9* 8.5*  HCT 31.0* 32.0* 25.2*  PLT 335  --  255   BMET  Recent Labs  07/23/12 1657 07/23/12 1724 07/24/12 0435  NA 142 142 145  K 4.0 3.8 3.3*  CL 106 106 111  CO2 25  --  24  GLUCOSE 242* 240* 149*  BUN 34* 32* 33*  CREATININE 2.12* 2.00* 2.12*  CALCIUM 9.4  --  8.5     Assessment/Plan: Stable from initial postoperative.  Will adjust IV fluids, adding KCl, add D5 to avoid hypoglycemia, and decreasing to sum of three quarter normal saline (50 cc of normal saline, 50 cc of D5 half-normal saline with 40 KCl per liter). We'll recheck CBC with differential and BMET in a.m.   For now patient is not alert enough to allow outpatient antihypertensive medications, will manage with Catapres patch (which he uses as an outpatient) as well as when necessary labetalol and when necessary hydralazine.  We'll check CT brain without contrast in a.m.  Case discussed with his nurse.   [name], MD 07/24/2012, 8:48 AM

## 2012-07-24 NOTE — Clinical Documentation Improvement (Signed)
DIABETIC  DOCUMENTATION CLARIFICATION QUERY  THIS DOCUMENT IS NOT A PERMANENT PART OF THE MEDICAL RECORD  Please update your documentation within the medical record to reflect your response to this query.                                                                                        07/24/12   Dear Dr.Nudelman,  In a better effort to capture your patient's severity of illness, reflect appropriate length of stay and utilization of resources, a review of the patient medical record has revealed the following indicators.   Based on your clinical judgment, please clarify and document in a progress note and/or discharge summary the clinical condition associated with the following supporting information: In responding to this query please exercise your independent judgment.  The fact that a query is asked, does not imply that any particular answer is desired or expected.   Hello Dr. Newell Coral!  If possible, please clarify and specify Diabetes control for this patient. Thank you!  Possible Clinical Conditions?  - Controlled or Uncontrolled  - Other condition (please document in the progress notes and/or discharge summary)  - Cannot Clinically determine at this time   Supporting Information:  Component Hemoglobin A1C  Latest Ref Rng <5.7 %  07/24/2012 12.3 (H)   Component      Glucose-Capillary  Latest Ref Rng      70 - 99 mg/dL  1/61/0960     4:54 PM 220 (H)  07/23/2012     9:56 PM 212 (H)  07/24/2012     12:08 AM 216 (H)  07/24/2012     4:14 AM 121 (H)  07/24/2012     8:30 AM 109 (H)  07/24/2012     11:36 AM 124 (H)    No additional documentation in chart upon review. SM   Thank You,  Saul Fordyce  Clinical Documentation Specialist: [phone] Pager [phone] Office  [facility] [place]

## 2012-07-25 ENCOUNTER — Encounter (HOSPITAL_COMMUNITY): Payer: Self-pay | Admitting: Neurosurgery

## 2012-07-25 ENCOUNTER — Inpatient Hospital Stay (HOSPITAL_COMMUNITY): Payer: Medicare Other

## 2012-07-25 LAB — CBC WITH DIFFERENTIAL/PLATELET
Basophils Absolute: 0.1 10*3/uL (ref 0.0–0.1)
Basophils Relative: 1 % (ref 0–1)
Eosinophils Absolute: 0.1 10*3/uL (ref 0.0–0.7)
Eosinophils Relative: 1 % (ref 0–5)
HCT: 26.1 % — ABNORMAL LOW (ref 36.0–46.0)
Hemoglobin: 8.6 g/dL — ABNORMAL LOW (ref 12.0–15.0)
Lymphocytes Relative: 12 % (ref 12–46)
Lymphs Abs: 1.2 10*3/uL (ref 0.7–4.0)
MCH: 26.5 pg (ref 26.0–34.0)
MCHC: 33 g/dL (ref 30.0–36.0)
MCV: 80.3 fL (ref 78.0–100.0)
Monocytes Absolute: 1.2 10*3/uL — ABNORMAL HIGH (ref 0.1–1.0)
Monocytes Relative: 12 % (ref 3–12)
Neutro Abs: 7.5 10*3/uL (ref 1.7–7.7)
Neutrophils Relative %: 75 % (ref 43–77)
Platelets: 239 10*3/uL (ref 150–400)
RBC: 3.25 MIL/uL — ABNORMAL LOW (ref 3.87–5.11)
RDW: 16.1 % — ABNORMAL HIGH (ref 11.5–15.5)
WBC: 10 10*3/uL (ref 4.0–10.5)

## 2012-07-25 LAB — GLUCOSE, CAPILLARY
Glucose-Capillary: 112 mg/dL — ABNORMAL HIGH (ref 70–99)
Glucose-Capillary: 158 mg/dL — ABNORMAL HIGH (ref 70–99)
Glucose-Capillary: 164 mg/dL — ABNORMAL HIGH (ref 70–99)
Glucose-Capillary: 174 mg/dL — ABNORMAL HIGH (ref 70–99)
Glucose-Capillary: 184 mg/dL — ABNORMAL HIGH (ref 70–99)
Glucose-Capillary: 188 mg/dL — ABNORMAL HIGH (ref 70–99)

## 2012-07-25 LAB — BASIC METABOLIC PANEL
BUN: 28 mg/dL — ABNORMAL HIGH (ref 6–23)
CO2: 21 mEq/L (ref 19–32)
Calcium: 8.1 mg/dL — ABNORMAL LOW (ref 8.4–10.5)
Chloride: 110 mEq/L (ref 96–112)
Creatinine, Ser: 2.06 mg/dL — ABNORMAL HIGH (ref 0.50–1.10)
GFR calc Af Amer: 24 mL/min — ABNORMAL LOW (ref >90)
GFR calc non Af Amer: 20 mL/min — ABNORMAL LOW (ref >90)
Glucose, Bld: 197 mg/dL — ABNORMAL HIGH (ref 70–99)
Potassium: 3.5 mEq/L (ref 3.5–5.1)
Sodium: 142 mEq/L (ref 135–145)

## 2012-07-25 MED ORDER — LINAGLIPTIN 5 MG PO TABS
5.0000 mg | ORAL_TABLET | Freq: Every day | ORAL | Status: DC
  Administered 2012-07-25 – 2012-07-30 (×6): 5 mg via ORAL
  Filled 2012-07-25 (×6): qty 1

## 2012-07-25 NOTE — Evaluation (Signed)
SLP reviewed and agree with student findings.   Nicosha Struve MA, CCC-SLP [phone]    

## 2012-07-25 NOTE — Progress Notes (Addendum)
Subjective: Patient resting in bed, without complaints. Drainage into Jackson-Pratt drain has steadily diminished. CT scan of brain without contrast this morning shows substantial decrease in subdural collection, with substantial decrease in mass effect and shift. Overall appearance the CT is improved.  Objective: Vital signs in last 24 hours: Filed Vitals:   07/25/12 0700 07/25/12 0800 07/25/12 0826 07/25/12 0900  BP: 184/64 189/67  182/71  Pulse: 89 84  84  Temp:   98.6 F (37 C)   TempSrc:   Oral   Resp: 16 16  13   Height:      Weight:      SpO2: 98% 99%  99%    Intake/Output from previous day: 02/18 0701 - 02/19 0700 In: 1382.5 [I.V.:1152.5; IV Piggyback:205] Out: 1365 [Urine:1225; Drains:140] Intake/Output this shift:    Physical Exam:  Awake, opening eyes spontaneously and to voice. Following commands with all 4 extremities. Mild left hemiparesis.  CBC  Recent Labs  07/24/12 0435 07/25/12 0430  WBC 10.9* 10.0  HGB 8.5* 8.6*  HCT 25.2* 26.1*  PLT 255 239   BMET  Recent Labs  07/24/12 0435 07/25/12 0430  NA 145 142  K 3.3* 3.5  CL 111 110  CO2 24 21  GLUCOSE 149* 197*  BUN 33* 28*  CREATININE 2.12* 2.06*  CALCIUM 8.5 8.1*    Studies/Results: Ct Head Wo Contrast  07/25/2012  *RADIOLOGY REPORT*  Clinical Data: Follow-up subdural evacuation.  CT HEAD WITHOUT CONTRAST  Technique:  Contiguous axial images were obtained from the base of the skull through the vertex without contrast.  Comparison: 07/23/2012  Findings: The patient is status post evacuation of a right supratentorial subacute and chronic subdural hematoma.  There is marked improvement in the degree of midline shift now measuring 4 mm right-to-left.  Moderate pneumocephalus in the subdural space. Residual subdural hypodense extra-axial fluid less impressive, 8 mm thickness as measured on image 17, significantly improved from preoperative status.  Moderate up to 12 mm thickness hyperdense fluid is seen  on images 21 - 24 roughly conforms to the undersurface of the craniotomy flap and could represent a small postoperative epidural hematoma.  The subdural drain lies medial to this suspected epidural collection.  Mild atrophy and chronic microvascular ischemic change redemonstrated.  IMPRESSION: Overall significantly improved appearance status post drainage of right hemisphere subacute to chronic subdural hematoma.  12 mm thick right posterior frontal epidural hematoma suspected, lying directly under the craniotomy flap.  Correlate clinically.   Original Report Authenticated By: Davonna Belling, M.D.    Ct Head Wo Contrast  07/23/2012  *RADIOLOGY REPORT*  Clinical Data: Altered mental status.  Left-sided weakness.  CT HEAD WITHOUT CONTRAST  Technique:  Contiguous axial images were obtained from the base of the skull through the vertex without contrast.  Comparison: CT 06/20/2012  Findings: Large mixed density subdural hematoma on the right has increased significantly since the prior study.  There is low density fluid anteriorly and   high density blood posteriorly.  The fluid collection measures approximate 14 mm.  There is mass effect on the right hemisphere with 13 mm midline shift.  The left lateral ventricle is   dilated and there is periventricular white matter edema in the left parietal white matter.  Negative for mass or acute infarct.  IMPRESSION: Large mixed density subdural hematoma on the right with 13 mm midline shift.  There is enlargement of the left lateral ventricle.  Critical Value/emergent results were called by telephone at the time of  interpretation on 07/23/2012 at 1807 hours to Dr. Lynelle Doctor, who verbally acknowledged these results.   Original Report Authenticated By: Janeece Riggers, M.D.     Assessment/Plan: We'll begin to progress through recovery. We'll order PT and OT. We'll resume home meds for hypertension. We'll begin out of bed to chair.  Dressing removed, wound healing nicely.  Jackson-Pratt drain removed, dry dressing applied. Will leave wound open to air.   [name], MD 07/25/2012, 11:12 AM

## 2012-07-25 NOTE — Evaluation (Signed)
Clinical/Bedside Swallow Evaluation Patient Details  Name: April [name] MRN: [id number] Date of Birth: 1921/12/12  Today's Date: 07/25/2012 Time: 4098-1191 SLP Time Calculation (min): 12 min  Past Medical History:  Past Medical History  Diagnosis Date  . Diabetes mellitus   . Hypertension   . Hyperlipidemia   . Pneumonia   . Subdural hematoma   . Arthritis   . DVT (deep venous thrombosis)   . Incontinence   . Renal disorder     chronic kidney dz stage III   Past Surgical History:  Past Surgical History  Procedure Laterality Date  . No past surgeries    . Insertion of vena cava filter     HPI:  Patient is an 79 y.o. female admitted for treatment of large right hemispheric chronic subdural hematoma. This is her third hospitalization over the past 5 weeks. Initial hospitalization was for pneumonia, increased renal insufficiency, and altered mental status, and she was found to have a moderate-sized right frontal mixed density subdural hematoma, with mild mass effect and no significant shift. Patient was evaluated at [facility] emergency room.  A CT scan of the brain without contrast revealed a large right hemispheric chronic subdural hematoma, with significant mass effect, and midline shift. Neurosurgical consultation was requested. Right frontoparietal craniotomy for evacuation of subdural hematoma was performed pm of 2/17.   Assessment / Plan / Recommendation Clinical Impression  Patient presents with extreme fatigue and lethargy and SLP was ubable to arouse to appropriate state for po trials. SLP sat patient upright and provided oral care with suction in attempt to arouse patient for po trials. Anterior spillage was observed characteristic of poor secretion management after oral care and patient was unable to produce a volitional swallow after max verbal cues from SLP and secretions were then suctioned. SLP spoke with nurse and agreed to f/u with patient in pm if/when patient  is more alert for po trials.    Aspiration Risk  Severe    Diet Recommendation NPO   Medication Administration: Via alternative means    Other  Recommendations Oral Care Recommendations: Oral care QID   Follow Up Recommendations   (TBD)    Frequency and Duration min 3x week  2 weeks   Pertinent Vitals/Pain None observed    SLP Swallow Goals Goal #3: Patient will maintain adequate level of arousal for po trials with min cues. Swallow Study Goal #3 - Progress: Other (comment) (new goal)   Swallow Study Prior Functional Status       General Date of Onset: 07/23/12 HPI: Patient is an 79 y.o. female admitted for treatment of large right hemispheric chronic subdural hematoma. This is her third hospitalization over the past 5 weeks. Initial hospitalization was for pneumonia, increased renal insufficiency, and altered mental status, and she was found to have a moderate-sized right frontal mixed density subdural hematoma, with mild mass effect and no significant shift. Patient was evaluated at [facility] emergency room.  A CT scan of the brain without contrast revealed a large right hemispheric chronic subdural hematoma, with significant mass effect, and midline shift. Neurosurgical consultation was requested. Right frontoparietal craniotomy for evacuation of subdural hematoma was performed pm of 2/17. Type of Study: Bedside swallow evaluation Previous Swallow Assessment: none Diet Prior to this Study: NPO Temperature Spikes Noted: No Respiratory Status: Room air History of Recent Intubation:  (for operation) Behavior/Cognition: Lethargic (unable to arouse) Oral Cavity - Dentition: Missing dentition;Poor condition Self-Feeding Abilities: Other (Comment) (unable to arouse adequately  for po trials) Patient Positioning: Upright in bed Baseline Vocal Quality: Low vocal intensity Volitional Cough: Cognitively unable to elicit Volitional Swallow: Unable to elicit     Oral/Motor/Sensory Function Overall Oral Motor/Sensory Function:  (unable to assess)   Ice Chips Ice chips: Not tested   Thin Liquid Thin Liquid: Not tested    Nectar Thick Nectar Thick Liquid: Not tested   Honey Thick Honey Thick Liquid: Not tested   Puree Puree: Not tested   Solid   GO   Berdine Dance SLP student Solid: Not tested       Berdine Dance 07/25/2012,11:00 AM

## 2012-07-26 LAB — GLUCOSE, CAPILLARY
Glucose-Capillary: 99 mg/dL (ref 70–99)
Glucose-Capillary: 130 mg/dL — ABNORMAL HIGH (ref 70–99)
Glucose-Capillary: 157 mg/dL — ABNORMAL HIGH (ref 70–99)
Glucose-Capillary: 177 mg/dL — ABNORMAL HIGH (ref 70–99)
Glucose-Capillary: 210 mg/dL — ABNORMAL HIGH (ref 70–99)
Glucose-Capillary: 96 mg/dL (ref 70–99)

## 2012-07-26 MED ORDER — HYDROCODONE-ACETAMINOPHEN 10-325 MG PO TABS: 1.0000 | ORAL_TABLET | ORAL | Status: DC | PRN

## 2012-07-26 MED ORDER — LEVETIRACETAM 500 MG PO TABS
500.0000 mg | ORAL_TABLET | Freq: Two times a day (BID) | ORAL | Status: DC
  Administered 2012-07-26 – 2012-07-30 (×9): 500 mg via ORAL
  Filled 2012-07-26 (×10): qty 1

## 2012-07-26 MED ORDER — PANTOPRAZOLE SODIUM 40 MG PO TBEC
40.0000 mg | DELAYED_RELEASE_TABLET | Freq: Every day | ORAL | Status: DC
  Administered 2012-07-26: 40 mg via ORAL
  Filled 2012-07-26: qty 1

## 2012-07-26 MED ORDER — ACETAMINOPHEN 325 MG PO TABS
325.0000 mg | ORAL_TABLET | ORAL | Status: DC | PRN
  Administered 2012-07-30: 650 mg via ORAL
  Filled 2012-07-26: qty 2

## 2012-07-26 NOTE — Progress Notes (Signed)
OT / PT Cancellation Note  Patient Details Name: April [name] MRN: [id number] DOB: 1922-02-05   Cancelled Treatment:    Reason Eval/Treat Not Completed: Patient not medically ready (strict bedrest)  MD please incr activity orders as appropriate.   Harrel Carina [employer] Pager: [phone]  07/26/2012, 8:51 AM

## 2012-07-26 NOTE — Progress Notes (Signed)
Subjective: Patient sitting up in chair, awake and alert, following commands. Has been taking a dysphasia 3 diet well. IV site infiltrated, and nursing staff and IV team have been unable to restart IV. PT and OT have been initiated.  Objective: Vital signs in last 24 hours: Filed Vitals:   07/26/12 1400 07/26/12 1500 07/26/12 1600 07/26/12 1700  BP: 140/78 145/55  178/80  Pulse:  81 91 79  Temp:   98.4 F (36.9 C)   TempSrc:   Oral   Resp: 21   22  Height:      Weight:      SpO2:  100% 99% 99%    Intake/Output from previous day: 02/19 0701 - 02/20 0700 In: 2310 [I.V.:2100; IV Piggyback:210] Out: 900 [Urine:900] Intake/Output this shift: Total I/O In: 2200 [P.O.:600; I.V.:1600] Out: 200 [Urine:200]  Physical Exam:  Dressing is removed, clean and dry. Incision healing nicely. Patient clearly much more awake and alert each day. Moving all 4 extremities well.  CBC  Recent Labs  07/24/12 0435 07/25/12 0430  WBC 10.9* 10.0  HGB 8.5* 8.6*  HCT 25.2* 26.1*  PLT 255 239   BMET  Recent Labs  07/24/12 0435 07/25/12 0430  NA 145 142  K 3.3* 3.5  CL 111 110  CO2 24 21  GLUCOSE 149* 197*  BUN 33* 28*  CREATININE 2.12* 2.06*  CALCIUM 8.5 8.1*    Assessment/Plan: We'll discontinue Foley, IV fluids, and IV morphine. Have ordered Tylenol or Norco for pain.   [name], MD 07/26/2012, 6:09 PM

## 2012-07-26 NOTE — Progress Notes (Signed)
Spoke with OT.  PT/OT will check back later for activity orders.  Thanks, Rollene Rotunda. Adriona Kaney, PT, DPT [phone]

## 2012-07-26 NOTE — Evaluation (Signed)
Occupational Therapy Evaluation Patient Details Name: April [name] MRN: [id number] DOB: 16-Jan-1922 Today's Date: 07/26/2012 Time: 1026-1050 OT Time Calculation (min): 24 min  OT Assessment / Plan / Recommendation Clinical Impression  79 yo female admitted for the thrid time in 5 weeks now with large rt SDH s/p Rt craniotomy. Pt admitted  previously for PNA , encephalopathy, and small Rt SDH and leaving [facility] mOd I with no devices. Ot to follow acutely adn recommend CIR for d/c planning    OT Assessment  Patient needs continued OT Services    Follow Up Recommendations  CIR    Barriers to Discharge      Equipment Recommendations  3 in 1 bedside comode;Other (comment) (RW)    Recommendations for Other Services Rehab consult  Frequency  Min 2X/week    Precautions / Restrictions Precautions Precautions: Fall   Pertinent Vitals/Pain     ADL  Grooming: Wash/dry face;Set up Where Assessed - Grooming: Supported sitting Lower Body Dressing: Moderate assistance Where Assessed - Lower Body Dressing: Unsupported sitting (don socks) Toilet Transfer: +2 Total assistance Toilet Transfer: Patient Percentage: 60% Toilet Transfer Method: Sit to stand Toilet Transfer Equipment: Regular height toilet Equipment Used: Gait belt;Rolling walker Transfers/Ambulation Related to ADLs: Pt required mod v/c for safety with RW and hand placement. Pt turning around inside the RW and attempting to back up to the chair. pt with no awareness of error. Pt attempting to abandon RW. Pt with slight Lt Knee buckle with initial standing. Pt verbalized Lt lE weak ADL Comments: Pt needed visual and auditory cues to sequence supine <>Sit eob. pt able to don Rt sock but not left sock. Pt demonstrates cognitive deficits and decr ambulation from baseline. Pt known to therapist and pt was ambulating MOD I last admission    OT Diagnosis: Generalized weakness;Cognitive deficits  OT Problem List: Decreased strength;Decreased  activity tolerance;Impaired balance (sitting and/or standing);Decreased cognition;Decreased safety awareness;Decreased knowledge of use of DME or AE;Decreased knowledge of precautions OT Treatment Interventions: Self-care/ADL training;DME and/or AE instruction;Therapeutic activities;Cognitive remediation/compensation;Patient/family education;Balance training   OT Goals Acute Rehab OT Goals OT Goal Formulation: Patient unable to participate in goal setting Time For Goal Achievement: 08/09/12 Potential to Achieve Goals: Good ADL Goals Pt Will Perform Grooming: with set-up;Sitting, chair;Supported ADL Goal: Grooming - Progress: Goal set today Pt Will Perform Upper Body Bathing: with min assist;Sitting at sink;Supported ADL Goal: Upper Body Bathing - Progress: Goal set today Pt Will Perform Upper Body Dressing: with min assist;Sitting, chair;Supported ADL Goal: Patent attorney - Progress: Goal set today Pt Will Transfer to Toilet: with min assist;Ambulation;3-in-1 ADL Goal: Toilet Transfer - Progress: Goal set today Pt Will Perform Toileting - Clothing Manipulation: with min assist;Sitting on 3-in-1 or toilet ADL Goal: Toileting - Clothing Manipulation - Progress: Goal set today Miscellaneous OT Goals Miscellaneous OT Goal #1: Pt will complete bed mobilty Supervision level as precursor to adls OT Goal: Miscellaneous Goal #1 - Progress: Goal set today  Visit Information  Last OT Received On: 07/26/12 Assistance Needed: +2 PT/OT Co-Evaluation/Treatment: Yes    Subjective Data  Subjective: "I am sick"- pt unaware of surg and reason for admission Patient Stated Goal: to return home to son   Prior Functioning     Home Living Lives With: Son Available Help at Discharge: Family Type of Home: House Home Access: Stairs to enter Secretary/administrator of Steps: 3-4 Entrance Stairs-Rails: Right Home Layout: One level Bathroom Shower/Tub: Tub/shower unit;Curtain Firefighter:  Standard Home Adaptive Equipment: None Additional  Comments: patient was ambulating MOD I on d/c 06/23/12 with no DME Prior Function Level of Independence: Independent Able to Take Stairs?: Yes Driving: No Vocation: Retired Musician: No difficulties Dominant Hand: Right         Vision/Perception Vision - History Baseline Vision: No visual deficits Patient Visual Report: No change from baseline   Cognition  Cognition Overall Cognitive Status: Impaired Area of Impairment: Attention;Memory;Following commands;Safety/judgement;Awareness of errors;Awareness of deficits;Problem solving Arousal/Alertness: Awake/alert Orientation Level: Disoriented to;Time;Situation;Person;Place Behavior During Session: [facility] for tasks performed Current Attention Level: Focused Memory Deficits: poor recall of events. Pt reports being sick and no surgery this admission Following Commands: Follows one step commands consistently;Follows one step commands with increased time Safety/Judgement: Decreased awareness of safety precautions;Impulsive;Decreased safety judgement for tasks assessed;Decreased awareness of need for assistance Awareness of Errors: Assistance required to identify errors made;Assistance required to correct errors made Cognition - Other Comments: pt reports month as August, Year as 1926, reason for admission sick and no surgery.     Extremity/Trunk Assessment Right Upper Extremity Assessment RUE ROM/Strength/Tone: Within functional levels RUE Sensation: WFL - Light Touch RUE Coordination: WFL - gross/fine motor Left Upper Extremity Assessment LUE ROM/Strength/Tone: Deficits LUE ROM/Strength/Tone Deficits: Grossly 4 out 5. Pt able to take resistance LUE Coordination: WFL - gross motor Trunk Assessment Trunk Assessment: Normal     Mobility Bed Mobility Bed Mobility: Supine to Sit;Sitting - Scoot to Edge of Bed Supine to Sit: 4: Min assist;With rails;HOB  elevated Sitting - Scoot to [place] of Bed: 4: Min assist Sit to Supine: 3: Mod assist;HOB flat Details for Bed Mobility Assistance: cues for sequence , pt attempting return to supine with head toward foot board. pt needed mod v/c to direct pt toward pillow at [facility] Transfers Transfers: Sit to Stand;Stand to Sit Sit to Stand: 1: +2 Total assist;With upper extremity assist;From bed Sit to Stand: Patient Percentage: 60% Stand to Sit: 1: +2 Total assist;With upper extremity assist;To chair/3-in-1 Stand to Sit: Patient Percentage: 60% Details for Transfer Assistance: pt needs cues for hand placement and use of RW     Exercise     Balance     End of Session OT - End of Session Activity Tolerance: Patient tolerated treatment well Patient left: in bed;with call bell/phone within reach (return to supine due to IV team arrival) Nurse Communication: Mobility status;Precautions  GO     Lucile Shutters 07/26/2012, 12:15 PM Pager: [phone]

## 2012-07-26 NOTE — Progress Notes (Signed)
Rehab Admissions Coordinator Note:  Patient was screened by Trish Mage for appropriateness for an Inpatient Acute Rehab Consult.  Noted PT and OT recommending CIR.  At this time, we are recommending Inpatient Rehab consult.  Lelon Frohlich M 07/26/2012, 4:08 PM  I can be reached at [phone].

## 2012-07-26 NOTE — Progress Notes (Signed)
SLP reviewed and agree with student findings.   Taron Mondor MA, CCC-SLP [phone]    

## 2012-07-26 NOTE — Evaluation (Signed)
Physical Therapy Evaluation Patient Details Name: April [name] MRN: [id number] DOB: 07-16-1921 Today's Date: 07/26/2012 Time: 1478-2956 PT Time Calculation (min): 23 min  PT Assessment / Plan / Recommendation Clinical Impression  79 y.o. female admitted to [facility] with increased weakness and confusion.  Chronic right SDH was determined to be getting worse, so SDH evacuation and crainiotomy completed.  Pt presnets today with increased confusion of situation, time and place.  She has difficulty problem solving and with memory.  She is mildly weaker on her left side arm and leg and is walking short distances with RW and 2 person assist for safety and line management.  She would be an excellent inpatient rehab candidate and it sounds like she has a very involved son.      PT Assessment  Patient needs continued PT services    Follow Up Recommendations  CIR    Does the patient have the potential to tolerate intense rehabilitation    yes  Barriers to Discharge Other (comment) unknown caregiver support    Equipment Recommendations  Rolling walker with 5" wheels    Recommendations for Other Services Rehab consult   Frequency Min 4X/week    Precautions / Restrictions Precautions Precautions: Fall Precaution Comments: left side weakness   Pertinent Vitals/Pain No reports of pain      Mobility  Bed Mobility Bed Mobility: Supine to Sit;Sitting - Scoot to Edge of Bed Supine to Sit: 4: Min assist;With rails;HOB elevated Sitting - Scoot to [place] of Bed: 4: Min assist Sit to Supine: 3: Mod assist;HOB flat Details for Bed Mobility Assistance: cues for sequence , pt attempting return to supine with head toward foot board. pt needed mod v/c to direct pt toward pillow at [facility]. Min assist for balance and tactile redirection, mod assist of trunk but not legs to get back into bed.   Transfers Sit to Stand: 1: +2 Total assist;With upper extremity assist;From bed Sit to Stand: Patient Percentage:  60% Stand to Sit: 1: +2 Total assist;With upper extremity assist;To chair/3-in-1 Stand to Sit: Patient Percentage: 60% Details for Transfer Assistance: pt needs cues for hand placement and use of RW.  2 person assist for safety, line management and stability of pt on her first walk.  Left leg bickling at times with WB.   Ambulation/Gait Ambulation/Gait Assistance: 4: Min assist Ambulation Distance (Feet): 20 Feet Assistive device: Rolling walker Ambulation/Gait Assistance Details: 2 person assist for safety, line management and chair unlocked and close by.  Pt had some mild buckling noted in her left leg which matched MMT prefmored in sitting.  Cues for safety with RW.  When going to sit pt turned all the way around sideways in RW and attempted to sit.  Difficulty processing and poblem sloving even though multi step command following is intact.   Gait Pattern: Step-through pattern;Shuffle Gait velocity: less than 1.8 ft/sec which puts her at risk for recurrent falls.  Modified Rankin (Stroke Patients Only) Pre-Morbid Rankin Score: Moderately severe disability (difficult to assess due to lack of family presence/pt cognit) Modified Rankin: Moderately severe disability        PT Diagnosis: Difficulty walking;Abnormality of gait;Generalized weakness;Hemiplegia non-dominant side;Altered mental status  PT Problem List: Decreased strength;Decreased activity tolerance;Decreased balance;Decreased mobility;Decreased coordination;Decreased knowledge of use of DME;Decreased safety awareness;Decreased cognition PT Treatment Interventions: DME instruction;Gait training;Stair training;Functional mobility training;Therapeutic activities;Balance training;Therapeutic exercise;Neuromuscular re-education;Cognitive remediation;Patient/family education;Wheelchair mobility training   PT Goals Acute Rehab PT Goals PT Goal Formulation: Patient unable to participate in goal setting Time  For Goal Achievement:  08/09/12 Potential to Achieve Goals: Good Pt will go Supine/Side to Sit: with supervision PT Goal: Supine/Side to Sit - Progress: Goal set today Pt will Sit at Edge of Bed: with supervision PT Goal: Sit at Edge Of Bed - Progress: Goal set today Pt will go Sit to Supine/Side: with supervision PT Goal: Sit to Supine/Side - Progress: Goal set today Pt will go Sit to Stand: with supervision PT Goal: Sit to Stand - Progress: Goal set today Pt will go Stand to Sit: with supervision PT Goal: Stand to Sit - Progress: Goal set today Pt will Transfer Bed to Chair/Chair to Bed: with supervision PT Transfer Goal: Bed to Chair/Chair to Bed - Progress: Goal set today Pt will Ambulate: 51 - 150 feet;with supervision;with rolling walker PT Goal: Ambulate - Progress: Goal set today  Visit Information  Last PT Received On: 07/26/12 Assistance Needed: +2 (lines and chair to follow) PT/OT Co-Evaluation/Treatment: Yes    Subjective Data  Subjective: Pt reports that she is in [place] in [place] hospital and that she is sick and has not had any surgery.  It is August 1921.  Patient Stated Goal: None reported.  She did like getting out of bed.     Prior Functioning  Home Living Lives With: Son Available Help at Discharge: Family Type of Home: House Home Access: Stairs to enter Secretary/administrator of Steps: 3-4 Entrance Stairs-Rails: Right Home Layout: One level Bathroom Shower/Tub: Tub/shower unit;Curtain Bathroom Toilet: Standard Home Adaptive Equipment: None Additional Comments: patient was ambulating MOD I on d/c 06/23/12 with no DME Prior Function Level of Independence: Independent Able to Take Stairs?: Yes Driving: No Vocation: Retired Musician: No difficulties Dominant Hand: Right    Cognition  Cognition Overall Cognitive Status: Impaired Area of Impairment: Attention;Memory;Following commands;Safety/judgement;Awareness of errors;Awareness of deficits;Problem  solving Arousal/Alertness: Awake/alert Orientation Level: Disoriented to;Time;Situation;Person;Place Behavior During Session: [facility] for tasks performed Current Attention Level: Focused Memory Deficits: poor recall of events. Pt reports being sick and no surgery this admission Following Commands: Follows one step commands consistently;Follows one step commands with increased time Safety/Judgement: Decreased awareness of safety precautions;Impulsive;Decreased safety judgement for tasks assessed;Decreased awareness of need for assistance Awareness of Errors: Assistance required to identify errors made;Assistance required to correct errors made Cognition - Other Comments: pt reports month as August, Year as 1924, reason for admission sick and no surgery.     Extremity/Trunk Assessment Right Upper Extremity Assessment RUE ROM/Strength/Tone: Within functional levels RUE Sensation: WFL - Light Touch RUE Coordination: WFL - gross/fine motor Left Upper Extremity Assessment LUE ROM/Strength/Tone: Deficits LUE ROM/Strength/Tone Deficits: Grossly 4 out 5. Pt able to take resistance LUE Coordination: WFL - gross motor Right Lower Extremity Assessment RLE ROM/Strength/Tone: Deficits RLE ROM/Strength/Tone Deficits: 4+/5 Left Lower Extremity Assessment LLE ROM/Strength/Tone: Deficits LLE ROM/Strength/Tone Deficits: 4-/5 Trunk Assessment Trunk Assessment: Normal   Balance Static Sitting Balance Static Sitting - Balance Support: Bilateral upper extremity supported;Feet supported Static Sitting - Level of Assistance: 5: Stand by assistance Static Sitting - Comment/# of Minutes: 5 mins EOB while preparing to stand and get OOB Dynamic Sitting Balance Dynamic Sitting - Balance Support: Feet supported (one upper extremity supported) Dynamic Sitting - Level of Assistance: 4: Min assist Dynamic Sitting - Comments: min guard assist while preforming functional task of attempting to put socks on in sitting.  Min  assist to prevent LOB forward.   Static Standing Balance Static Standing - Balance Support: Bilateral upper extremity supported Static Standing - Level of  Assistance: 4: Min assist Static Standing - Comment/# of Minutes: < 1 min statically while preparing to walk with RW.  Up to min assist, started with 2 person assist for safety.    End of Session PT - End of Session Equipment Utilized During Treatment: Gait belt Activity Tolerance: Patient limited by fatigue Patient left: in bed;with call bell/phone within reach (due to IV team RN needed her in bed ) Nurse Communication: Mobility status    Lurena Joiner B. Raeqwon Lux, PT, DPT [phone]   07/26/2012, 3:45 PM

## 2012-07-26 NOTE — Progress Notes (Signed)
Speech Language Pathology Dysphagia Treatment Patient Details Name: April [name] MRN: [id number] DOB: [birth year] Today's Date: 07/26/2012 Time: 4098-1191 SLP Time Calculation (min): 22 min  Assessment / Plan / Recommendation Clinical Impression  Treatment focused on therapeutic po trials with patient demonstrating increased LOA for diet upgrade. Patient was able to participate in an oral mech exam today 2/20 due to increased LOA from 2/19 and demonstrated functional oral mechanisms with good lingual strength and coordination. Patient was observed to have several top teeth missing and remaining teeth in poor condition which resulted in prolonged mastication. SLP observed patient with dys-3 (mechanical soft) and thin liquids with no overt s/s of aspiration. SLP recommends patient upgrade to dys-3 with thin liquids due to patients missing dention and poor conditon of exsisting teeth. SLP educated patient on aspiration risks and rationale for curent diet and will f/u for diet tolerance.    Diet Recommendation  Initiate / Change Diet: Dysphagia 3 (mechanical soft);Thin liquid    SLP Plan Goals updated   Pertinent Vitals/Pain None reported   Swallowing Goals  SLP Swallowing Goals Patient will utilize recommended strategies during swallow to increase swallowing safety with: Minimal assistance Swallow Study Goal #2 - Progress: Progressing toward goal Swallow Study Goal #3 - Progress: Met  General Temperature Spikes Noted: No Respiratory Status: Room air Behavior/Cognition: Alert;Cooperative Oral Cavity - Dentition: Missing dentition;Poor condition Patient Positioning: Upright in bed  Oral Cavity - Oral Hygiene     Dysphagia Treatment Treatment focused on: Upgraded PO texture trials;Patient/family/caregiver education Treatment Methods/Modalities: Skilled observation;Differential diagnosis Patient observed directly with PO's: Yes Type of PO's observed: Dysphagia 3 (soft);Thin  liquids Feeding: Able to feed self;Needs assist Liquids provided via: Cup;Straw Oral Phase Signs & Symptoms: Prolonged mastication (missing majority of top teeth, teeth in poor condition)   GO   Berdine Dance SLP student  Berdine Dance 07/26/2012, 12:30 PM

## 2012-07-27 DIAGNOSIS — S065X9A Traumatic subdural hemorrhage with loss of consciousness of unspecified duration, initial encounter: Secondary | ICD-10-CM

## 2012-07-27 LAB — GLUCOSE, CAPILLARY
Glucose-Capillary: 107 mg/dL — ABNORMAL HIGH (ref 70–99)
Glucose-Capillary: 125 mg/dL — ABNORMAL HIGH (ref 70–99)
Glucose-Capillary: 242 mg/dL — ABNORMAL HIGH (ref 70–99)
Glucose-Capillary: 175 mg/dL — ABNORMAL HIGH (ref 70–99)
Glucose-Capillary: 96 mg/dL (ref 70–99)
Glucose-Capillary: 55 mg/dL — ABNORMAL LOW (ref 70–99)
Glucose-Capillary: 96 mg/dL (ref 70–99)

## 2012-07-27 NOTE — Progress Notes (Signed)
Physical Therapy Treatment Patient Details Name: April [name] MRN: [id number] DOB: March 28, 1922 Today's Date: 07/27/2012 Time: 5784-6962 PT Time Calculation (min): 26 min  PT Assessment / Plan / Recommendation Comments on Treatment Session  79 y.o. female admitted to [facility] with increased weakness and confusion. Chronic right SDH was determined to be getting worse, so SDH evacuation and crainiotomy completed. Pt continues with Lt sided weakness, decreased balance, and decr cognition all contributing to decr independence and safety with mobility. Continues to benefit from PT.    Follow Up Recommendations  CIR     Does the patient have the potential to tolerate intense rehabilitation    yes  Barriers to Discharge        Equipment Recommendations  Rolling walker with 5" wheels    Recommendations for Other Services Rehab consult  Frequency Min 4X/week   Plan Discharge plan remains appropriate;Frequency remains appropriate    Precautions / Restrictions Precautions Precautions: Fall Precaution Comments: left side weakness   Pertinent Vitals/Pain VSS on ICU monitor Lt knee pain with ambulation (not rated); provided RW and repositioned    Mobility  Bed Mobility Bed Mobility: Not assessed Transfers Transfers: Stand to Sit Sit to Stand: Other (comment) (pt up in bathroom with NT on arrival) Stand to Sit: 4: Min assist;With upper extremity assist;With armrests;To chair/3-in-1 Details for Transfer Assistance: vc for safe use of RW and hand placement Ambulation/Gait Ambulation/Gait Assistance: 3: Mod assist Ambulation Distance (Feet): 90 Feet Assistive device: Rolling walker;1 person hand held assist Ambulation/Gait Assistance Details: Pt initially pushing RW too far ahead and with difficulty coordinating use; switched to Rt HHA and pt was min assist x 40 ft, however then LLE fatigued with incr buckling; RW shortened and pt returned to room with RW Gait Pattern: Step-through  pattern;Decreased stride length;Trunk flexed;Wide base of support Gait velocity: less than 1.8 ft/sec which puts her at risk for recurrent falls.     Exercises          PT Goals Acute Rehab PT Goals Pt will go Sit to Stand: with supervision PT Goal: Sit to Stand - Progress: Progressing toward goal Pt will go Stand to Sit: with supervision PT Goal: Stand to Sit - Progress: Progressing toward goal Pt will Ambulate: 51 - 150 feet;with supervision;with rolling walker PT Goal: Ambulate - Progress: Progressing toward goal  Visit Information  Last PT Received On: 07/27/12 Assistance Needed: +1 PT/OT Co-Evaluation/Treatment: Yes    Subjective Data      Cognition  Cognition Overall Cognitive Status: Impaired Area of Impairment: Attention;Memory;Problem solving Arousal/Alertness: Awake/alert Orientation Level: Disoriented to;Place;Time;Situation (in [place]; unaware of brain surgery) Behavior During Session: Flat affect Current Attention Level: Sustained Memory Deficits: no recall of information provided 2/20 Following Commands: Follows one step commands consistently Problem Solving: slow processing    Balance  Balance Balance Assessed: Yes Static Standing Balance Static Standing - Balance Support: During functional activity;No upper extremity supported Static Standing - Level of Assistance: 4: Min assist Static Standing - Comment/# of Minutes: at sink doing ADLs with OT; leans posterior; x 4 minutes  End of Session PT - End of Session Equipment Utilized During Treatment: Gait belt Activity Tolerance: Patient limited by fatigue Patient left: in chair;with call bell/phone within reach;with chair alarm set Nurse Communication: Mobility status   GP     Belma Dyches 07/27/2012, 10:01 AM Pager [phone]

## 2012-07-27 NOTE — Clinical Social Work Note (Signed)
Clinical Social Worker was asked by RN to assist patient son with patient PCP follow up. Per RN, patient has recently moved here with her son from [place] and did not have a PCP established.  CSW provided RN with list of local MD's to provide patient son.  Patient son continued to have difficulties making appointments and requested that CSW assist further.  CSW spoke with patient son at bedside who was agreeable to [facility] in [place].  CSW contacted office and made arrangements for patient to have an appointment on August 15, 2012 at 10:00am.  Patient son informed and updated in patient follow up discharge instructions.    Patient son is hopeful that patient will be able to admit to inpatient rehab prior to return home.  CSW will follow up with patient son if necessary following inpatient rehab consult.  Macario Golds, [place] [phone]

## 2012-07-27 NOTE — Progress Notes (Signed)
Subjective: Patient sitting up in chair, eating breakfast. Denies complaints.  Objective: Vital signs in last 24 hours: Filed Vitals:   07/27/12 0400 07/27/12 0411 07/27/12 0500 07/27/12 0815  BP: 155/80  170/91   Pulse: 72  82   Temp:  97.4 F (36.3 C)  98.3 F (36.8 C)  TempSrc:  Axillary  Oral  Resp: 20  18   Height:      Weight:      SpO2: 98%  97%     Intake/Output from previous day: 02/20 0701 - 02/21 0700 In: 2200 [P.O.:600; I.V.:1600] Out: 200 [Urine:200] Intake/Output this shift:    Physical Exam:  Awake alert, following commands, moving all 4 extremities. Wound clean and dry.  CBC  Recent Labs  07/25/12 0430  WBC 10.0  HGB 8.6*  HCT 26.1*  PLT 239   BMET  Recent Labs  07/25/12 0430  NA 142  K 3.5  CL 110  CO2 21  GLUCOSE 197*  BUN 28*  CREATININE 2.06*  CALCIUM 8.1*    Assessment/Plan: We'll transfer to 4 N.  Will remove half of staples. Encouraged to ambulate with walker. Will check CT brain without on February 24 AM.   [name], MD 07/27/2012, 8:31 AM

## 2012-07-27 NOTE — Progress Notes (Signed)
Pt transferred to 4N room 16 after RN gave report via phone to receiving nurse. No belongings present except bathing supplies, sent with pt to new room. Pt in wheelchair, no oxygen, VSS. Transferred by NA and RN. Family notified of transfer by phone.    Delynn Flavin, RN, BSN

## 2012-07-27 NOTE — Progress Notes (Signed)
Pt takes her Lantus in the morning, per her son.

## 2012-07-27 NOTE — Progress Notes (Signed)
Occupational Therapy Treatment Patient Details Name: April [name] MRN: [id number] DOB: 1921-10-19 Today's Date: 07/27/2012 Time: 4098-1191 OT Time Calculation (min): 24 min  OT Assessment / Plan / Recommendation Comments on Treatment Session Pt progressing this session and demonstrates some recall from beginning of session to end of session. Pt with cognitive deficits remaining affecting all ADLS    Follow Up Recommendations  CIR    Barriers to Discharge       Equipment Recommendations  3 in 1 bedside comode;Other (comment)    Recommendations for Other Services Rehab consult  Frequency Min 2X/week   Plan Discharge plan remains appropriate    Precautions / Restrictions Precautions Precautions: Fall Precaution Comments: left side weakness   Pertinent Vitals/Pain     ADL  Eating/Feeding: Set up Where Assessed - Eating/Feeding: Chair Grooming: Wash/dry face;Teeth care;Wash/dry hands;Minimal assistance Where Assessed - Grooming: Supported standing Toilet Transfer: Minimal Dentist Method: Sit to Barista: Regular height toilet;Grab bars Toileting - Clothing Manipulation and Hygiene: Maximal assistance Where Assessed - Engineer, mining and Hygiene: Sit to stand from 3-in-1 or toilet Equipment Used: Gait belt;Rolling walker Transfers/Ambulation Related to ADLs: Pt ambulating with RW and c/o Lt Knee pain.  ADL Comments: Pt static standing at sink with posterior lean and LOB. pt with LOB with single leg standing to operate foot pedals. pt needed min v/c for locating necessary ADL items. Pt unaware of surgery and initially reports no visual changes in appears. Pt with cueing states changes on Rt side of scalp. Pt educated on reason for admission, year, date and recent sx. Pt did however clearly and quickly respond with correct president name when asked. Pt demonstrates poor recall of room location and unable with max questioning  cues to locate room 3108.     OT Goals Acute Rehab OT Goals OT Goal Formulation: Patient unable to participate in goal setting Time For Goal Achievement: 08/09/12 Potential to Achieve Goals: Good ADL Goals Pt Will Perform Grooming: with set-up;Sitting, chair;Supported ADL Goal: Grooming - Progress: Progressing toward goals Pt Will Perform Upper Body Bathing: with min assist;Sitting at sink;Supported Pt Will Perform Upper Body Dressing: with min assist;Sitting, chair;Supported Engineer, water to Toilet: with min assist;Ambulation;3-in-1 ADL Goal: Statistician - Progress: Progressing toward goals ADL Goal: Toileting - Clothing Manipulation - Progress: Progressing toward goals Miscellaneous OT Goals Miscellaneous OT Goal #1: Pt will complete bed mobilty Supervision level as precursor to adls  Visit Information  Last OT Received On: 07/27/12 Assistance Needed: +1 PT/OT Co-Evaluation/Treatment: Yes    Subjective Data      Prior Functioning       Cognition  Cognition Overall Cognitive Status: Impaired Area of Impairment: Attention;Memory;Problem solving Arousal/Alertness: Awake/alert Orientation Level: Disoriented to;Place;Time;Situation Behavior During Session: Flat affect Current Attention Level: Sustained Memory Deficits: no recall of information provided 2/20 (no recognition of RN that has covered patient x2 days in ago) Following Commands: Follows one step commands consistently Safety/Judgement: Decreased awareness of safety precautions;Impulsive;Decreased safety judgement for tasks assessed;Decreased awareness of need for assistance Awareness of Errors: Assistance required to identify errors made;Assistance required to correct errors made Problem Solving: slow processing Cognition - Other Comments: Reports Year as 1920s    Mobility  Bed Mobility Bed Mobility: Not assessed Transfers Sit to Stand: Other (comment) (pt up in bathroom with NT on arrival)) Stand to  Sit: 4: Min assist;With upper extremity assist;With armrests;To chair/3-in-1 Details for Transfer Assistance: vc for safe use of RW and hand placement  Exercises      Balance Balance Balance Assessed: Yes Static Sitting Balance Static Sitting - Balance Support: Bilateral upper extremity supported;Feet supported Static Sitting - Level of Assistance: 5: Stand by assistance Static Sitting - Comment/# of Minutes: 5 mins EOB while preparing to stand and get OOB Static Standing Balance Static Standing - Balance Support: During functional activity;No upper extremity supported Static Standing - Level of Assistance: 4: Min assist Static Standing - Comment/# of Minutes: at sink doing ADLs with OT; leans posterior; x 4 minutes   End of Session OT - End of Session Activity Tolerance: Patient tolerated treatment well Patient left: in bed;with call bell/phone within reach Nurse Communication: Mobility status;Precautions  GO     Lucile Shutters 07/27/2012, 10:26 AM Pager: [phone]

## 2012-07-27 NOTE — Consult Note (Signed)
Physical Medicine and Rehabilitation Consult Reason for Consult: Subdural hematoma Referring Physician: Dr. Newell Coral   HPI: April [name] is a 79 y.o. right-handed female with history of diabetes mellitus and peripheral neuropathy, hypertension and chronic renal insufficiency with baseline 1.80-2.10 as well his recent DVT with IVC filter placed. Admitted 07/23/2012 with noted history of large right hemispheric chronic subdural hematoma. She had been seen by neurosurgery in the past and surgeries not felt to be indicated. She now presents with increased left-sided weakness over the past 2 days and followup CT scan again shows chronic subdural hematoma with significant mass effect and midline shift. Patient underwent right frontoparietal craniotomy evacuation of subdural hematoma 07/23/2012 per Dr. Newell Coral. Placed on Keppra for seizure prophylaxis. Followup cranial CT scan planned for 07/27/2012. Patient is maintained on a dysphagia 3 thin liquid diet. Latest hemoglobin A1c of 12.3 and currently maintained on oral agents as well as sliding scale insulin. Physical and occupational therapy evaluations completed 07/26/2012 was noted ongoing bouts of confusion, problem solving and memory loss. Recommendations are made for physical medicine rehabilitation consult to consider inpatient rehabilitation services   Review of Systems  Musculoskeletal: Positive for myalgias.  Neurological: Positive for weakness and headaches.  All other systems reviewed and are negative.   Past Medical History  Diagnosis Date  . Diabetes mellitus   . Hypertension   . Hyperlipidemia   . Pneumonia   . Subdural hematoma   . Arthritis   . DVT (deep venous thrombosis)   . Incontinence   . Renal disorder     chronic kidney dz stage III   Past Surgical History  Procedure Laterality Date  . No past surgeries    . Insertion of vena cava filter    . Craniotomy Right 07/23/2012    Procedure: CRANIOTOMY HEMATOMA EVACUATION  SUBDURAL;  Surgeon: [name], MD;  Location: [facility];  Service: Neurosurgery;  Laterality: Right;   Family History  Problem Relation Age of Onset  . Diabetes Mellitus II Son    Social History:  reports that she has never smoked. She has never used smokeless tobacco. She reports that she does not drink alcohol or use illicit drugs. Allergies: No Known Allergies Medications Prior to Admission  Medication Sig Dispense Refill  . amLODipine (NORVASC) 10 MG tablet Take 1 tablet (10 mg total) by mouth daily.  30 tablet  1  . atorvastatin (LIPITOR) 40 MG tablet Take 40 mg by mouth every evening.      . carvedilol (COREG) 25 MG tablet Take 1 tablet (25 mg total) by mouth 2 (two) times daily with a meal.  60 tablet  3  . cloNIDine (CATAPRES - DOSED IN MG/24 HR) 0.3 mg/24hr Place 1 patch (0.3 mg total) onto the skin once a week.  4 patch  2  . insulin glargine (LANTUS) 100 UNIT/ML injection Inject 20 Units into the skin daily.      . sitaGLIPtin (JANUVIA) 50 MG tablet Take 1 tablet (50 mg total) by mouth daily.  30 tablet  1  . telmisartan (MICARDIS) 80 MG tablet Take 1 tablet (80 mg total) by mouth daily.  30 tablet  3    Home: Home Living Lives With: Son Available Help at Discharge: Family Type of Home: House Home Access: Stairs to enter Secretary/administrator of Steps: 3-4 Entrance Stairs-Rails: Right Home Layout: One level Bathroom Shower/Tub: Tub/shower unit;Curtain Bathroom Toilet: Standard Home Adaptive Equipment: None Additional Comments: patient was ambulating MOD I on d/c 06/23/12 with  no DME  Functional History: Prior Function Able to Take Stairs?: Yes Driving: No Vocation: Retired Functional Status:  Mobility: Bed Mobility Bed Mobility: Supine to Sit;Sitting - Scoot to Edge of Bed Supine to Sit: 4: Min assist;With rails;HOB elevated Sitting - Scoot to [place] of Bed: 4: Min assist Sit to Supine: 3: Mod assist;HOB flat Transfers Sit to Stand: 1: +2 Total  assist;With upper extremity assist;From bed Sit to Stand: Patient Percentage: 60% Stand to Sit: 1: +2 Total assist;With upper extremity assist;To chair/3-in-1 Stand to Sit: Patient Percentage: 60% Ambulation/Gait Ambulation/Gait Assistance: 4: Min assist Ambulation Distance (Feet): 20 Feet Assistive device: Rolling walker Ambulation/Gait Assistance Details: 2 person assist for safety, line management and chair unlocked and close by.  Pt had some mild buckling noted in her left leg which matched MMT prefmored in sitting.  Cues for safety with RW.  When going to sit pt turned all the way around sideways in RW and attempted to sit.  Difficulty processing and poblem sloving even though multi step command following is intact.   Gait Pattern: Step-through pattern;Shuffle Gait velocity: less than 1.8 ft/sec which puts her at risk for recurrent falls.     ADL: ADL Grooming: Wash/dry face;Set up Where Assessed - Grooming: Supported sitting Lower Body Dressing: Moderate assistance Where Assessed - Lower Body Dressing: Unsupported sitting (don socks) Toilet Transfer: +2 Total assistance Toilet Transfer Method: Sit to stand Toilet Transfer Equipment: Regular height toilet Equipment Used: Gait belt;Rolling walker Transfers/Ambulation Related to ADLs: Pt required mod v/c for safety with RW and hand placement. Pt turning around inside the RW and attempting to back up to the chair. pt with no awareness of error. Pt attempting to abandon RW. Pt with slight Lt Knee buckle with initial standing. Pt verbalized Lt lE weak ADL Comments: Pt needed visual and auditory cues to sequence supine <>Sit eob. pt able to don Rt sock but not left sock. Pt demonstrates cognitive deficits and decr ambulation from baseline. Pt known to therapist and pt was ambulating MOD I last admission  Cognition: Cognition Arousal/Alertness: Awake/alert Orientation Level: Oriented to person;Oriented to place Cognition Overall  Cognitive Status: Impaired Area of Impairment: Attention;Memory;Following commands;Safety/judgement;Awareness of errors;Awareness of deficits;Problem solving Arousal/Alertness: Awake/alert Orientation Level: Disoriented to;Time;Situation;Person;Place Behavior During Session: [facility] for tasks performed Current Attention Level: Focused Memory Deficits: poor recall of events. Pt reports being sick and no surgery this admission Following Commands: Follows one step commands consistently;Follows one step commands with increased time Safety/Judgement: Decreased awareness of safety precautions;Impulsive;Decreased safety judgement for tasks assessed;Decreased awareness of need for assistance Awareness of Errors: Assistance required to identify errors made;Assistance required to correct errors made Cognition - Other Comments: pt reports month as August, Year as 1924, reason for admission sick and no surgery.   Blood pressure 170/91, pulse 82, temperature 98.3 F (36.8 C), temperature source Oral, resp. rate 18, height 5\' 1"  (1.549 m), weight 68.6 kg (151 lb 3.8 oz), SpO2 97.00%. Physical Exam  Vitals reviewed. HENT:  Staples intact to craniotomy site.  Eyes: EOM are normal.  Neck: Neck supple. No thyromegaly present.  Cardiovascular: Regular rhythm.   Pulmonary/Chest: Effort normal and breath sounds normal. She has no wheezes.  Abdominal: Soft. Bowel sounds are normal. She exhibits no distension.  Neurological: She is alert.  Patient can provide her name and age. She could not state her date of birth. She knew she was in a hospital but could not provide the name of the hospital. She did follow simple commands. She  did need frequent redirection to tasks. Decreased coordination with fine motor movements of the left arm, leg. No focal CN findings.     Results for orders placed during the hospital encounter of 07/23/12 (from the past 24 hour(s))  GLUCOSE, CAPILLARY     Status: Abnormal   Collection Time     07/26/12 11:58 AM      Result Value Range   Glucose-Capillary 210 (*) 70 - 99 mg/dL  GLUCOSE, CAPILLARY     Status: Abnormal   Collection Time    07/26/12  5:10 PM      Result Value Range   Glucose-Capillary 177 (*) 70 - 99 mg/dL   Comment 1 Notify RN     Comment 2 Documented in Chart    GLUCOSE, CAPILLARY     Status: None   Collection Time    07/26/12 10:03 PM      Result Value Range   Glucose-Capillary 96  70 - 99 mg/dL   Comment 1 Notify RN     Comment 2 Documented in Chart    GLUCOSE, CAPILLARY     Status: Abnormal   Collection Time    07/27/12  4:13 AM      Result Value Range   Glucose-Capillary 107 (*) 70 - 99 mg/dL  GLUCOSE, CAPILLARY     Status: Abnormal   Collection Time    07/27/12  8:14 AM      Result Value Range   Glucose-Capillary 125 (*) 70 - 99 mg/dL   No results found.  Assessment/Plan: Diagnosis: acute on chronic right SDH 1. Does the need for close, 24 hr/day medical supervision in concert with the patient's rehab needs make it unreasonable for this patient to be served in a less intensive setting? Yes 2. Co-Morbidities requiring supervision/potential complications: dm, htn, ckd 3. Due to bladder management, bowel management, safety, skin/wound care, disease management, medication administration, pain management and patient education, does the patient require 24 hr/day rehab nursing? Yes 4. Does the patient require coordinated care of a physician, rehab nurse, PT (1-2 hrs/day, 5 days/week), OT (1-2 hrs/day, 5 days/week) and SLP (1-2 hrs/day, 5 days/week) to address physical and functional deficits in the context of the above medical diagnosis(es)? Yes Addressing deficits in the following areas: balance, endurance, locomotion, strength, transferring, bowel/bladder control, bathing, dressing, feeding, grooming, toileting, cognition, language and psychosocial support 5. Can the patient actively participate in an intensive therapy program of at least 3 hrs of  therapy per day at least 5 days per week? Yes 6. The potential for patient to make measurable gains while on inpatient rehab is excellent 7. Anticipated functional outcomes upon discharge from inpatient rehab are supervision with PT, supervision with OT, supervision with SLP. 8. Estimated rehab length of stay to reach the above functional goals is: 7-10 days 9. Does the patient have adequate social supports to accommodate these discharge functional goals? Yes and Potentially 10. Anticipated D/C setting: Home 11. Anticipated post D/C treatments: Outpt therapy 12. Overall Rehab/Functional Prognosis: excellent  RECOMMENDATIONS: This patient's condition is appropriate for continued rehabilitative care in the following setting: CIR Patient has agreed to participate in recommended program. Yes and Potentially Note that insurance prior authorization may be required for reimbursement for recommended care.  Comment:Rehab RN to follow up.   Ivory Broad, MD     07/27/2012

## 2012-07-27 NOTE — Progress Notes (Signed)
UR completed 

## 2012-07-27 NOTE — Progress Notes (Signed)
Patient received from 3100.  Arousable, oriented to self only.  Patient oriented to room and unit.  Vitals stable.  Denies pain.  Will continue to monitor.

## 2012-07-28 LAB — GLUCOSE, CAPILLARY
Glucose-Capillary: 92 mg/dL (ref 70–99)
Glucose-Capillary: 88 mg/dL (ref 70–99)
Glucose-Capillary: 193 mg/dL — ABNORMAL HIGH (ref 70–99)
Glucose-Capillary: 197 mg/dL — ABNORMAL HIGH (ref 70–99)
Glucose-Capillary: 142 mg/dL — ABNORMAL HIGH (ref 70–99)
Glucose-Capillary: 176 mg/dL — ABNORMAL HIGH (ref 70–99)

## 2012-07-28 NOTE — Progress Notes (Signed)
Subjective: Patient reports She's doing okay she is very little headache no nausea or vomiting no numbness or tingling  Objective: Vital signs in last 24 hours: Temp:  [97.5 F (36.4 C)-98.4 F (36.9 C)] 97.7 F (36.5 C) (02/22 0703) Pulse Rate:  [75-99] 88 (02/22 0703) Resp:  [18-23] 22 (02/22 0703) BP: (129-183)/(57-101) 180/84 mmHg (02/22 0738) SpO2:  [97 %-100 %] 99 % (02/22 0703)  Intake/Output from previous day: 02/21 0701 - 02/22 0700 In: 240 [P.O.:240] Out: -  Intake/Output this shift:    Her incision is clean and dry she has no pronator drift  Lab Results: No results found for this basename: WBC, HGB, HCT, PLT,  in the last 72 hours BMET No results found for this basename: NA, K, CL, CO2, GLUCOSE, BUN, CREATININE, CALCIUM,  in the last 72 hours  Studies/Results: No results found.  Assessment/Plan: Progressing well plan for this afternoon for repeat her CT scan on Monday in a probable discharge  LOS: 5 days     Holland Kotter P 07/28/2012, 8:57 AM

## 2012-07-29 LAB — GLUCOSE, CAPILLARY
Glucose-Capillary: 102 mg/dL — ABNORMAL HIGH (ref 70–99)
Glucose-Capillary: 114 mg/dL — ABNORMAL HIGH (ref 70–99)
Glucose-Capillary: 151 mg/dL — ABNORMAL HIGH (ref 70–99)
Glucose-Capillary: 174 mg/dL — ABNORMAL HIGH (ref 70–99)
Glucose-Capillary: 201 mg/dL — ABNORMAL HIGH (ref 70–99)
Glucose-Capillary: 70 mg/dL (ref 70–99)

## 2012-07-29 NOTE — Progress Notes (Signed)
Patient ID: April [name], female   DOB: 13-May-1922, 79 y.o.   MRN: [id number] Subjective:  The patient is alert and pleasant. She has no complaints. She looks well.  Objective: Vital signs in last 24 hours: Temp:  [97.3 F (36.3 C)-98.4 F (36.9 C)] 98.4 F (36.9 C) (02/22 1706) Pulse Rate:  [78-89] 89 (02/23 0600) Resp:  [20-22] 22 (02/23 0600) BP: (144-192)/(56-70) 192/70 mmHg (02/23 0600) SpO2:  [97 %-99 %] 98 % (02/23 0600)  Intake/Output from previous day:   Intake/Output this shift:    Physical exam the patient is alert and oriented. She moves her extremity well. Her incision is healing well.  Lab Results: No results found for this basename: WBC, HGB, HCT, PLT,  in the last 72 hours BMET No results found for this basename: NA, K, CL, CO2, GLUCOSE, BUN, CREATININE, CALCIUM,  in the last 72 hours  Studies/Results: No results found.  Assessment/Plan: Status post craniotomy for subdural hematoma: We plan to repeat her CAT scan Monday.  LOS: 6 days     Leandra Vanderweele D 07/29/2012, 10:15 AM

## 2012-07-29 NOTE — Progress Notes (Signed)
Patient's facial edema seems resolving and better than what was observed this morning. MD aware but no orders at this time.Suggested should keep monitoring patient. No acute complaints at this time from patient.

## 2012-07-29 NOTE — Progress Notes (Signed)
This morning during shift change, pt was seen to have bilateral papilloedema of which  RN said was seen last night. MD notified , awaiting for response. .BP 173/58  Pulse 87  Temp(Src) 98.4 F (36.9 C) (Oral)  Resp 22  Ht 5\' 1"  (1.549 m)  Wt 68.6 kg (151 lb 3.8 oz)  BMI 28.59 kg/m2  SpO2 98%

## 2012-07-30 ENCOUNTER — Inpatient Hospital Stay (HOSPITAL_COMMUNITY): Payer: Medicare Other

## 2012-07-30 ENCOUNTER — Encounter (HOSPITAL_COMMUNITY): Payer: Self-pay | Admitting: Radiology

## 2012-07-30 ENCOUNTER — Inpatient Hospital Stay (HOSPITAL_COMMUNITY)
Admission: RE | Admit: 2012-07-30 | Discharge: 2012-08-06 | DRG: 945 | Disposition: A | Discharge status: Other Acute Inpatient Hospital | Payer: Medicare Other | Source: Intra-hospital | Attending: Physical Medicine & Rehabilitation | Admitting: Physical Medicine & Rehabilitation

## 2012-07-30 DIAGNOSIS — Z833 Family history of diabetes mellitus: Secondary | ICD-10-CM

## 2012-07-30 DIAGNOSIS — N183 Chronic kidney disease, stage 3 unspecified: Secondary | ICD-10-CM

## 2012-07-30 DIAGNOSIS — E785 Hyperlipidemia, unspecified: Secondary | ICD-10-CM

## 2012-07-30 DIAGNOSIS — R29898 Other symptoms and signs involving the musculoskeletal system: Secondary | ICD-10-CM

## 2012-07-30 DIAGNOSIS — I10 Essential (primary) hypertension: Secondary | ICD-10-CM | POA: Diagnosis present

## 2012-07-30 DIAGNOSIS — N179 Acute kidney failure, unspecified: Secondary | ICD-10-CM | POA: Diagnosis present

## 2012-07-30 DIAGNOSIS — I6200 Nontraumatic subdural hemorrhage, unspecified: Secondary | ICD-10-CM

## 2012-07-30 DIAGNOSIS — S065X9A Traumatic subdural hemorrhage with loss of consciousness of unspecified duration, initial encounter: Secondary | ICD-10-CM

## 2012-07-30 DIAGNOSIS — Z86718 Personal history of other venous thrombosis and embolism: Secondary | ICD-10-CM

## 2012-07-30 DIAGNOSIS — I129 Hypertensive chronic kidney disease with stage 1 through stage 4 chronic kidney disease, or unspecified chronic kidney disease: Secondary | ICD-10-CM

## 2012-07-30 DIAGNOSIS — S065XAA Traumatic subdural hemorrhage with loss of consciousness status unknown, initial encounter: Secondary | ICD-10-CM

## 2012-07-30 DIAGNOSIS — R471 Dysarthria and anarthria: Secondary | ICD-10-CM

## 2012-07-30 DIAGNOSIS — E1142 Type 2 diabetes mellitus with diabetic polyneuropathy: Secondary | ICD-10-CM

## 2012-07-30 DIAGNOSIS — E1149 Type 2 diabetes mellitus with other diabetic neurological complication: Secondary | ICD-10-CM

## 2012-07-30 DIAGNOSIS — E1165 Type 2 diabetes mellitus with hyperglycemia: Secondary | ICD-10-CM

## 2012-07-30 DIAGNOSIS — E119 Type 2 diabetes mellitus without complications: Secondary | ICD-10-CM | POA: Diagnosis present

## 2012-07-30 DIAGNOSIS — J45909 Unspecified asthma, uncomplicated: Secondary | ICD-10-CM

## 2012-07-30 DIAGNOSIS — R51 Headache: Secondary | ICD-10-CM

## 2012-07-30 DIAGNOSIS — R29810 Facial weakness: Secondary | ICD-10-CM

## 2012-07-30 DIAGNOSIS — Z5189 Encounter for other specified aftercare: Principal | ICD-10-CM

## 2012-07-30 LAB — GLUCOSE, CAPILLARY
Glucose-Capillary: 122 mg/dL — ABNORMAL HIGH (ref 70–99)
Glucose-Capillary: 141 mg/dL — ABNORMAL HIGH (ref 70–99)
Glucose-Capillary: 136 mg/dL — ABNORMAL HIGH (ref 70–99)
Glucose-Capillary: 198 mg/dL — ABNORMAL HIGH (ref 70–99)
Glucose-Capillary: 146 mg/dL — ABNORMAL HIGH (ref 70–99)
Glucose-Capillary: 151 mg/dL — ABNORMAL HIGH (ref 70–99)

## 2012-07-30 MED ORDER — CLONIDINE HCL 0.3 MG/24HR TD PTWK
0.3000 mg | MEDICATED_PATCH | TRANSDERMAL | Status: DC
  Administered 2012-08-05: 0.3 mg via TRANSDERMAL
  Filled 2012-07-30: qty 1

## 2012-07-30 MED ORDER — MEPERIDINE HCL 25 MG/ML IJ SOLN: 6.2500 mg | INTRAMUSCULAR | Status: DC | PRN

## 2012-07-30 MED ORDER — CARVEDILOL 25 MG PO TABS
25.0000 mg | ORAL_TABLET | Freq: Two times a day (BID) | ORAL | Status: DC
  Administered 2012-07-30 – 2012-08-06 (×14): 25 mg via ORAL
  Filled 2012-07-30 (×16): qty 1

## 2012-07-30 MED ORDER — LEVETIRACETAM 500 MG PO TABS
500.0000 mg | ORAL_TABLET | Freq: Two times a day (BID) | ORAL | Status: DC
  Administered 2012-07-30 – 2012-08-06 (×14): 500 mg via ORAL
  Filled 2012-07-30 (×18): qty 1

## 2012-07-30 MED ORDER — ONDANSETRON HCL 4 MG PO TABS: 4.0000 mg | ORAL_TABLET | Freq: Four times a day (QID) | ORAL | Status: DC | PRN

## 2012-07-30 MED ORDER — INSULIN ASPART 100 UNIT/ML ~~LOC~~ SOLN
0.0000 [IU] | SUBCUTANEOUS | Status: DC
  Administered 2012-07-30: 3 [IU] via SUBCUTANEOUS
  Administered 2012-07-30: 2 [IU] via SUBCUTANEOUS
  Administered 2012-07-31: 8 [IU] via SUBCUTANEOUS
  Administered 2012-07-31 (×2): 3 [IU] via SUBCUTANEOUS
  Administered 2012-08-01 (×2): 5 [IU] via SUBCUTANEOUS

## 2012-07-30 MED ORDER — LINAGLIPTIN 5 MG PO TABS
5.0000 mg | ORAL_TABLET | Freq: Every day | ORAL | Status: DC
  Administered 2012-07-30 – 2012-08-06 (×8): 5 mg via ORAL
  Filled 2012-07-30 (×11): qty 1

## 2012-07-30 MED ORDER — IRBESARTAN 300 MG PO TABS
300.0000 mg | ORAL_TABLET | Freq: Every day | ORAL | Status: DC
  Administered 2012-07-30 – 2012-08-06 (×8): 300 mg via ORAL
  Filled 2012-07-30 (×11): qty 1

## 2012-07-30 MED ORDER — ATORVASTATIN CALCIUM 40 MG PO TABS
40.0000 mg | ORAL_TABLET | Freq: Every evening | ORAL | Status: DC
  Administered 2012-07-30 – 2012-08-05 (×6): 40 mg via ORAL
  Filled 2012-07-30 (×9): qty 1

## 2012-07-30 MED ORDER — AMLODIPINE BESYLATE 10 MG PO TABS
10.0000 mg | ORAL_TABLET | Freq: Every day | ORAL | Status: DC
  Administered 2012-07-30 – 2012-08-06 (×8): 10 mg via ORAL
  Filled 2012-07-30 (×11): qty 1

## 2012-07-30 MED ORDER — ONDANSETRON HCL 4 MG/2ML IJ SOLN: 4.0000 mg | Freq: Four times a day (QID) | INTRAMUSCULAR | Status: DC | PRN

## 2012-07-30 MED ORDER — SORBITOL 70 % SOLN: 30.0000 mL | Freq: Every day | Status: DC | PRN

## 2012-07-30 MED ORDER — ACETAMINOPHEN 325 MG PO TABS
325.0000 mg | ORAL_TABLET | ORAL | Status: DC | PRN
  Administered 2012-08-05: 650 mg via ORAL
  Filled 2012-07-30 (×2): qty 2

## 2012-07-30 NOTE — Discharge Summary (Signed)
Physician Discharge Summary  Patient ID: April [name] MRN: [id number] DOB/AGE: 77-Feb-1923 77 y.o.  Admit date: 07/23/2012 Discharge date: 07/30/2012  Admission Diagnoses:  Subacute and chronic subdural hematoma, left hemiparesis, chronic renal insufficiency, diabetes  Discharge Diagnoses:  Subacute and chronic subdural hematoma, left hemiparesis, chronic renal insufficiency, diabetes  Discharged Condition: good  Hospital Course:  Patient was admitted and taken to surgery for a right frontoparietal craniotomy and evacuation of subdural hematoma. Neurologically the patient steadily improved, with increasing alertness and orientation and improved strength on her left side. Her subdural drain was removed on the second postoperative day. Her wound is healed well, and the staples have been removed. Initial followup CT scan showed good evacuation of the subdural hematoma, with limited residual subdural collection, but a repeat CT scan today, on the day of her transfer to the rehabilitation center, showed increased subdural collection, mass effect, and shift. However neurologically the patient has remained stable and it has been elected to transfer the patient to the rehabilitation center, and monitor the subdural collection with serial CT scans. I've discussed with the patient as well as the rehabilitation center staff that the subdural collection may gradually resorb gradually over time or it may worsen, which would most likely necessitate reoperation. However the overall consensus is to proceed with further rehabilitation and continued monitoring with serial CT scans. I've attempted to contact the patient's son by phone (through the rehabilitation center staff) but we had to leave a voicemail.   Discharge Exam: Blood pressure 182/69, pulse 81, temperature 98.1 F (36.7 C), temperature source Oral, resp. rate 20, height 5\' 1"  (1.549 m), weight 68.6 kg (151 lb 3.8 oz), SpO2 98.00%.  Disposition:  transfer  to [facility] hospital inpatient rehabilitation service      Medication List    ASK your doctor about these medications       amLODipine 10 MG tablet  Commonly known as:  NORVASC  Take 1 tablet (10 mg total) by mouth daily.     atorvastatin 40 MG tablet  Commonly known as:  LIPITOR  Take 40 mg by mouth every evening.     carvedilol 25 MG tablet  Commonly known as:  COREG  Take 1 tablet (25 mg total) by mouth 2 (two) times daily with a meal.     cloNIDine 0.3 mg/24hr  Commonly known as:  CATAPRES - Dosed in mg/24 hr  Place 1 patch (0.3 mg total) onto the skin once a week.     insulin glargine 100 UNIT/ML injection  Commonly known as:  LANTUS  Inject 20 Units into the skin daily.     sitaGLIPtin 50 MG tablet  Commonly known as:  JANUVIA  Take 1 tablet (50 mg total) by mouth daily.     telmisartan 80 MG tablet  Commonly known as:  MICARDIS  Take 1 tablet (80 mg total) by mouth daily.           Follow-up Information   Follow up with [facility] On 08/15/2012. (Please be there by 9:30 for 10:00 appointment.  Be sure to bring insurance cards and picture ID.)    Contact information:   [address] [place] [address] [phone]      Signed: [name], MD 07/30/2012, 9:47 AM

## 2012-07-30 NOTE — Progress Notes (Signed)
Filed Vitals:   07/29/12 1800 07/29/12 2305 07/30/12 0131 07/30/12 0542  BP: 166/70 150/97 175/72 182/69  Pulse: 84 81 84 81  Temp: 98.1 F (36.7 C) 98.3 F (36.8 C) 98.4 F (36.9 C) 98.1 F (36.7 C)  TempSrc: Oral Oral Oral Oral  Resp: 20 20 20 20   Height:      Weight:      SpO2: 96% 98% 95% 98%     Patient resting in bed, without complaints. Has continue to work with PT and OT.  Awake and alert, following commands, with all 4 extremities. No drift of the upper extremities. Wound clean and dry.  CT of the brain without contrast this morning shows increase subdural collection, with increased mass effect and shift.  Plan: Have discussed case with Ms. Ottie Glazier and Mr. Harvel Ricks from the rehabilitation center. I feel the patient is neurologically stable despite the worsened appearance of her CT scan. I do not feel that reoperation is indicated at this time, but rather continued followup. I've recommended CT of the brain without contrast in one week, or sooner if she has a neurologic decline. They have explain that she can therefore be transferred to the rehabilitation center, and I will continue to follow her.  We will remove the remainder of her staples prior transfer.  [name], MD 07/30/2012, 9:38 AM

## 2012-07-30 NOTE — H&P (Signed)
Physical Medicine and Rehabilitation Admission H&P    Chief Complaint  Patient presents with  . Altered Mental Status  : HPI: April [name] is a 79 y.o. right-handed female with history of diabetes mellitus and peripheral neuropathy, hypertension and chronic renal insufficiency with baseline 1.80-2.10 as well his recent DVT with IVC filter placed. Admitted 07/23/2012 with noted history of large right hemispheric chronic subdural hematoma. She had been seen by neurosurgery in the past and surgeries not felt to be indicated. She now presents with increased left-sided weakness over the past 2 days and followup CT scan again shows chronic subdural hematoma with significant mass effect and midline shift. Patient underwent right frontoparietal craniotomy evacuation of subdural hematoma 07/23/2012 per Dr. Nudelman. Placed on Keppra for seizure prophylaxis. Followup cranial CT scan 07/30/2012 does show some increase in size of right sided subdural collection as well as slight increase in size of blood collection. Dr. Nudelman of neurosurgery has reviewed the film and neurologically patient looks good with plan to repeat cranial CT scan in one week. Patient is maintained on a dysphagia 3 thin liquid diet. Latest hemoglobin A1c of 12.3 and currently maintained on oral agents as well as sliding scale insulin. Physical and occupational therapy evaluations completed 07/26/2012 was noted ongoing bouts of confusion, problem solving and memory loss. Recommendations are made for physical medicine rehabilitation consult to consider inpatient rehabilitation services. Patient was felt to be a good candidate for inpatient rehabilitation services and was admitted for a comprehensive rehabilitation program.  Review of Systems  Musculoskeletal: Positive for myalgias.  Neurological: Positive for weakness and headaches.  All other systems reviewed and are negative   Past Medical History  Diagnosis Date  . Diabetes mellitus    . Hypertension   . Hyperlipidemia   . Pneumonia   . Subdural hematoma   . Arthritis   . DVT (deep venous thrombosis)   . Incontinence   . Renal disorder     chronic kidney dz stage III   Past Surgical History  Procedure Laterality Date  . No past surgeries    . Insertion of vena cava filter    . Craniotomy Right 07/23/2012    Procedure: CRANIOTOMY HEMATOMA EVACUATION SUBDURAL;  Surgeon: Robert W Nudelman, MD;  Location: [facility];  Service: Neurosurgery;  Laterality: Right;   Family History  Problem Relation Age of Onset  . Diabetes Mellitus II Son    Social History:  reports that she has never smoked. She has never used smokeless tobacco. She reports that she does not drink alcohol or use illicit drugs. Allergies: No Known Allergies Medications Prior to Admission  Medication Sig Dispense Refill  . amLODipine (NORVASC) 10 MG tablet Take 1 tablet (10 mg total) by mouth daily.  30 tablet  1  . atorvastatin (LIPITOR) 40 MG tablet Take 40 mg by mouth every evening.      . carvedilol (COREG) 25 MG tablet Take 1 tablet (25 mg total) by mouth 2 (two) times daily with a meal.  60 tablet  3  . cloNIDine (CATAPRES - DOSED IN MG/24 HR) 0.3 mg/24hr Place 1 patch (0.3 mg total) onto the skin once a week.  4 patch  2  . insulin glargine (LANTUS) 100 UNIT/ML injection Inject 20 Units into the skin daily.      . sitaGLIPtin (JANUVIA) 50 MG tablet Take 1 tablet (50 mg total) by mouth daily.  30 tablet  1  . telmisartan (MICARDIS) 80 MG tablet Take 1 tablet (80   mg total) by mouth daily.  30 tablet  3    Home: Home Living Lives With: Son Available Help at Discharge: Family Type of Home: House Home Access: Stairs to enter Entrance Stairs-Number of Steps: 3-4 Entrance Stairs-Rails: Right Home Layout: One level Bathroom Shower/Tub: Tub/shower unit;Curtain Bathroom Toilet: Standard Home Adaptive Equipment: None Additional Comments: patient was ambulating MOD I on d/c 06/23/12 with no DME    Functional History: Prior Function Able to Take Stairs?: Yes Driving: No Vocation: Retired  Functional Status:  Mobility: Bed Mobility Bed Mobility: Not assessed Supine to Sit: 4: Min assist;With rails;HOB elevated Sitting - Scoot to Edge of Bed: 4: Min assist Sit to Supine: 3: Mod assist;HOB flat Transfers Transfers: Stand to Sit Sit to Stand: Other (comment) (pt up in bathroom with NT on arrival)) Sit to Stand: Patient Percentage: 60% Stand to Sit: 4: Min assist;With upper extremity assist;With armrests;To chair/3-in-1 Stand to Sit: Patient Percentage: 60% Ambulation/Gait Ambulation/Gait Assistance: 3: Mod assist Ambulation Distance (Feet): 90 Feet Assistive device: Rolling walker;1 person hand held assist Ambulation/Gait Assistance Details: Pt initially pushing RW too far ahead and with difficulty coordinating use; switched to Rt HHA and pt was min assist x 40 ft, however then LLE fatigued with incr buckling; RW shortened and pt returned to room with RW Gait Pattern: Step-through pattern;Decreased stride length;Trunk flexed;Wide base of support Gait velocity: less than 1.8 ft/sec which puts her at risk for recurrent falls.     ADL: ADL Eating/Feeding: Set up Where Assessed - Eating/Feeding: Chair Grooming: Wash/dry face;Teeth care;Wash/dry hands;Minimal assistance Where Assessed - Grooming: Supported standing Lower Body Dressing: Moderate assistance Where Assessed - Lower Body Dressing: Unsupported sitting (don socks) Toilet Transfer: Minimal assistance Toilet Transfer Method: Sit to stand Toilet Transfer Equipment: Regular height toilet;Grab bars Equipment Used: Gait belt;Rolling walker Transfers/Ambulation Related to ADLs: Pt ambulating with RW and c/o Lt Knee pain.  ADL Comments: Pt static standing at sink with posterior lean and LOB. pt with LOB with single leg standing to operate foot pedals. pt needed min v/c for locating necessary ADL items. Pt unaware of surgery  and initially reports no visual changes in appears. Pt with cueing states changes on Rt side of scalp. Pt educated on reason for admission, year, date and recent sx. Pt did however clearly and quickly respond with correct president name when asked.  Cognition: Cognition Arousal/Alertness: Awake/alert Orientation Level: Oriented to person;Disoriented to place;Oriented to time;Oriented to situation Cognition Overall Cognitive Status: Impaired Area of Impairment: Attention;Memory;Problem solving Arousal/Alertness: Awake/alert Orientation Level: Disoriented to;Place;Time;Situation Behavior During Session: Flat affect Current Attention Level: Sustained Memory Deficits: no recall of information provided 2/20 (no recognition of RN that has covered patient x2 days in ago) Following Commands: Follows one step commands consistently Safety/Judgement: Decreased awareness of safety precautions;Impulsive;Decreased safety judgement for tasks assessed;Decreased awareness of need for assistance Awareness of Errors: Assistance required to identify errors made;Assistance required to correct errors made Problem Solving: slow processing Cognition - Other Comments: Reports Year as 1920s  Physical Exam: Blood pressure 182/69, pulse 81, temperature 98.1 F (36.7 C), temperature source Oral, resp. rate 20, height 5' 1" (1.549 m), weight 68.6 kg (151 lb 3.8 oz), SpO2 98.00%. Physical Exam  Vitals reviewed.  HENT: oral mucosa generally moist. White coating over the tongue. Staples intact to craniotomy site.  Eyes: EOM are normal.  Neck: Neck supple. No thyromegaly present.  Cardiovascular: Regular rhythm. No Murmurs, gallops Pulmonary/Chest: Effort normal and breath sounds normal. She has no wheezes.  Abdominal:   Soft. Bowel sounds are normal. She exhibits no distension. NT Neurological: She is alert.  Patient can provide her name and age. She could  state her date of birth with subtle cues. She knew she was in  a hospital but could not provide the name of the hospital. Could not tell me why she was here. She did follow simple commands. She did need frequent redirection to tasks. Decreased coordination with fine motor movements of the left arm, leg. Mild left facial droop. Left arm is grossly 4-/5. Left leg is 3+ to 4-.  Sensation is 1/2 on the left, but she does sense gross pain. Speech is dysarthric.  Results for orders placed during the hospital encounter of 07/23/12 (from the past 48 hour(s))  GLUCOSE, CAPILLARY     Status: Abnormal   Collection Time    07/28/12  8:40 AM      Result Value Range   Glucose-Capillary 193 (*) 70 - 99 mg/dL   Comment 1 Notify RN     Comment 2 Documented in Chart    GLUCOSE, CAPILLARY     Status: Abnormal   Collection Time    07/28/12 11:35 AM      Result Value Range   Glucose-Capillary 197 (*) 70 - 99 mg/dL   Comment 1 Notify RN     Comment 2 Documented in Chart    GLUCOSE, CAPILLARY     Status: Abnormal   Collection Time    07/28/12  4:12 PM      Result Value Range   Glucose-Capillary 142 (*) 70 - 99 mg/dL   Comment 1 Notify RN     Comment 2 Documented in Chart    GLUCOSE, CAPILLARY     Status: Abnormal   Collection Time    07/28/12  8:16 PM      Result Value Range   Glucose-Capillary 176 (*) 70 - 99 mg/dL  GLUCOSE, CAPILLARY     Status: Abnormal   Collection Time    07/29/12 12:05 AM      Result Value Range   Glucose-Capillary 174 (*) 70 - 99 mg/dL  GLUCOSE, CAPILLARY     Status: None   Collection Time    07/29/12  4:31 AM      Result Value Range   Glucose-Capillary 70  70 - 99 mg/dL  GLUCOSE, CAPILLARY     Status: Abnormal   Collection Time    07/29/12  7:17 AM      Result Value Range   Glucose-Capillary 114 (*) 70 - 99 mg/dL  GLUCOSE, CAPILLARY     Status: Abnormal   Collection Time    07/29/12 11:33 AM      Result Value Range   Glucose-Capillary 151 (*) 70 - 99 mg/dL  GLUCOSE, CAPILLARY     Status: Abnormal   Collection Time    07/29/12   4:32 PM      Result Value Range   Glucose-Capillary 201 (*) 70 - 99 mg/dL  GLUCOSE, CAPILLARY     Status: Abnormal   Collection Time    07/29/12 11:03 PM      Result Value Range   Glucose-Capillary 102 (*) 70 - 99 mg/dL   Comment 1 Documented in Chart     Comment 2 Notify RN    GLUCOSE, CAPILLARY     Status: Abnormal   Collection Time    07/30/12  1:50 AM      Result Value Range   Glucose-Capillary 122 (*) 70 - 99 mg/dL     Comment 1 Documented in Chart     Comment 2 Notify RN    GLUCOSE, CAPILLARY     Status: Abnormal   Collection Time    07/30/12  6:43 AM      Result Value Range   Glucose-Capillary 141 (*) 70 - 99 mg/dL   Comment 1 Documented in Chart     Comment 2 Notify RN     Ct Head Wo Contrast  07/30/2012  *RADIOLOGY REPORT*  Clinical Data: Followup evacuation subdural hematoma. Facial swelling.  CT HEAD WITHOUT CONTRAST  Technique:  Contiguous axial images were obtained from the base of the skull through the vertex without contrast.  Comparison: 07/25/2012.  Findings: Post right frontal craniotomy for resection of right- sided subdural hematoma.  Below the craniotomy flap, there is a complex blood and gas containing collection (containing dispersed surgical clips) having maximal thickness of 1.2 cm inferior component appears slightly more prominent than on prior examination.  Surgical drain has been removed.  Interval decrease in size of right hemispheric pneumocephalus. Interval increase in size of right-sided subdural predominately hypodense collection containing small areas hyperdensity (blood) now with maximal thickness 1.9 cm versus recent maximal thickness of 1.6 cm.  Local mass effect with compression of the right lateral ventricle.  Midline shift to the to the left of by 5.2 mm versus prior 3.9 mm.  Increase in size of the right temporalis muscle.  Increase in subcutaneous swelling right lateral orbital region.  This may reflect leakage of fluid through the craniotomy site.   Small vessel disease type changes without CT evidence of large acute infarct. No intracranial mass lesion detected on this unenhanced exam.  Vascular calcifications.  IMPRESSION: Increase in size of the right-sided subdural collection, slight increase in size of blood collection below the right-sided craniotomy site and slight increase in mass effect upon the right lateral ventricle and midline shift to the left.  Increase in size of the right temporalis muscle.  Increase in subcutaneous swelling right lateral orbital region.  This may reflect leakage of fluid through the craniotomy site  This has been made a PRA call report utilizing dashboard call feature.   Original Report Authenticated By: Steven Olson, M.D.     Post Admission Physician Evaluation: 1. Functional deficits secondary  to acute on chronic right fronto-parietal SDH. 2. Patient is admitted to receive collaborative, interdisciplinary care between the physiatrist, rehab nursing staff, and therapy team. 3. Patient's level of medical complexity and substantial therapy needs in context of that medical necessity cannot be provided at a lesser intensity of care such as a SNF. 4. Patient has experienced substantial functional loss from his/her baseline which was documented above under the "Functional History" and "Functional Status" headings.  Judging by the patient's diagnosis, physical exam, and functional history, the patient has potential for functional progress which will result in measurable gains while on inpatient rehab.  These gains will be of substantial and practical use upon discharge  in facilitating mobility and self-care at the household level. 5. Physiatrist will provide 24 hour management of medical needs as well as oversight of the therapy plan/treatment and provide guidance as appropriate regarding the interaction of the two. 6. 24 hour rehab nursing will assist with bladder management, bowel management, safety, skin/wound care,  disease management, medication administration, pain management and pain mgt  and help integrate therapy concepts, techniques,education, etc. 7. PT will assess and treat for/with: Lower extremity strength, range of motion, stamina, balance, functional mobility, safety, adaptive techniques and   equipment, NMR, education.   Goals are: supervision. 8. OT will assess and treat for/with: ADL's, functional mobility, safety, upper extremity strength, adaptive techniques and equipment, NMR, education.   Goals are: supervision. 9. SLP will assess and treat for/with: cognition and communication.  Goals are: supervision. 10. Case Management and Social Worker will assess and treat for psychological issues and discharge planning. 11. Team conference will be held weekly to assess progress toward goals and to determine barriers to discharge. 12. Patient will receive at least 3 hours of therapy per day at least 5 days per week. 13. ELOS: 7-10 days      Prognosis:  excellent   Medical Problem List and Plan: 1.Acute on chronic right SDH. Status post right frontal parietal craniotomy 07/23/2012. Plan followup cranial CT scan 08/05/2012 for monitoring of any increase in size of right sided subdural collection. Monitor closely for a neurological changes 2. DVT Prophylaxis/Anticoagulation: SCDs. Monitor for any signs of DVT 3. Pain Management: Tylenol as needed 4. Neuropsych: This patient is not capable of making decisions on his/her own behalf. 5. Seizure prophylaxis. Keppra 500 mg twice a day. Monitor for any seizure activity 6. Diabetes mellitus peripheral neuropathy. Hemoglobin A1c of 12.3. Tradjenta 5 mg daily. Check blood sugars a.c. and at bedtime 7. Hypertension. Coreg 25 mg twice a day, clonidine patch change every 7 days and Avapro 300 mg daily. Monitor with increased activity   Zachary T. Swartz, MD, FAAPMR  07/30/2012 

## 2012-07-30 NOTE — PMR Pre-admission (Signed)
PMR Admission Coordinator Pre-Admission Assessment  Patient: April [name] is an 79 y.o., female MRN: [id number] DOB: 1922-03-09 Height: 5\' 1"  (154.9 cm) Weight: 68.6 kg (151 lb 3.8 oz)              Insurance Information HMO:     PPO:      PCP:      IPA:      80/20: yes     OTHER: no HMO PRIMARY: Medicare A and B      Policy#: [id number]      Subscriber: pt CM Name:       Phone#:      Fax#:  Pre-Cert#:       Employer:  Benefits:  Phone #: [employer]     Name: 07/30/12 Eff. Date: A 06/06/90 B 12/05/94     Deduct: $1216      Out of Pocket Max: none      Life Max: none CIR: 100%      SNF: 20 full days LBD 07/05/12 Outpatient: 80%     Co-Pay: 20% Home Health: 100%      Co-Pay: none DME: 80%     Co-Pay: 20% Providers: pt choice  SECONDARY: [employer]      Policy#: [id number]      Subscriber: pt No auth required with medicare primary  THIRD: [place]   POLICY: [id number]  Medicaid Application Date:       Case Manager:  Disability Application Date:       Case Worker:   Emergency Contact Information Contact Information   Name Relation Home Work Mobile   [place] Son [phone]  [phone]   Larey Dresser Daughter [phone]  [phone]     Current Medical History  Patient Admitting Diagnosis: acute on chronic SDH  History of Present Illness: April [name] is a 80 y.o. right-handed female with history of diabetes mellitus and peripheral neuropathy, hypertension and chronic renal insufficiency with baseline 1.80-2.10 as well his recent DVT with IVC filter placed. Admitted 07/23/2012 with noted history of large right hemispheric chronic subdural hematoma. She had been seen by neurosurgery in the past and surgeries not felt to be indicated. She now presents with increased left-sided weakness over the past 2 days and followup CT scan again shows chronic subdural hematoma with significant mass effect and midline shift. Patient underwent right frontoparietal craniotomy evacuation  of subdural hematoma 07/23/2012 per Dr. Newell Coral. Placed on Keppra for seizure prophylaxis.   Patient is maintained on a dysphagia 3 thin liquid diet. Latest hemoglobin A1c of 12.3 and currently maintained on oral agents as well as sliding scale insulin. Physical and occupational therapy evaluations completed 07/26/2012 was noted ongoing bouts of confusion, problem solving and memory loss.  Follow up CT 07/30/12 without contrast with increased subdural collection, with increased mass effect and shift. Dr. Jule Ser felt pt is neurologically stable despite the appearance of her CT scan. No reoperation is indicated today, but rather continued follow up. Repeat CT scan without contrast in one week , or sooner if she has a neurological decline. Total: 6    Past Medical History  Past Medical History  Diagnosis Date  . Diabetes mellitus   . Hypertension   . Hyperlipidemia   . Pneumonia   . Subdural hematoma   . Arthritis   . DVT (deep venous thrombosis)   . Incontinence   . Renal disorder     chronic kidney dz stage III    Family History  family history includes Diabetes Mellitus  II in her son.  Prior Rehab/Hospitalizations: none   Current Medications  Current facility-administered medications:acetaminophen (TYLENOL) tablet 325-650 mg, 325-650 mg, Oral, Q4H PRN, [name], MD, 650 mg at 07/30/12 2130;  amLODipine (NORVASC) tablet 10 mg, 10 mg, Oral, Daily, [name], MD, 10 mg at 07/30/12 1047;  atorvastatin (LIPITOR) tablet 40 mg, 40 mg, Oral, QPM, [name], MD, 40 mg at 07/29/12 1643 bisacodyl (DULCOLAX) suppository 10 mg, 10 mg, Rectal, Daily PRN, [name], MD, 10 mg at 07/29/12 1627;  carvedilol (COREG) tablet 25 mg, 25 mg, Oral, BID WC, [name], MD, 25 mg at 07/30/12 8657;  cloNIDine (CATAPRES - Dosed in mg/24 hr) patch 0.3 mg, 0.3 mg, Transdermal, Weekly, [name], MD, 0.3 mg at 07/24/12 0024 insulin aspart (novoLOG) injection 0-15  Units, 0-15 Units, Subcutaneous, Q4H, [name], MD, 3 Units at 07/30/12 1208;  irbesartan (AVAPRO) tablet 300 mg, 300 mg, Oral, Daily, [name], MD, 300 mg at 07/30/12 1047;  levETIRAcetam (KEPPRA) tablet 500 mg, 500 mg, Oral, BID, [name], MD, 500 mg at 07/30/12 1046;  linagliptin (TRADJENTA) tablet 5 mg, 5 mg, Oral, Daily, [name], MD, 5 mg at 07/30/12 1046 magnesium hydroxide (MILK OF MAGNESIA) suspension 30 mL, 30 mL, Oral, Daily PRN, [name], MD;  ondansetron [facility]) tablet 4 mg, 4 mg, Oral, Q4H PRN, [name], MD  Patients Current Diet: Dysphagia 3 diet with thin liquids  Precautions / Restrictions Precautions Precautions: Fall Precaution Comments: left side weakness Restrictions Weight Bearing Restrictions: No   Prior Activity Level Patient was living in a senior apartment with a nephew living with her until 06/08/2012. Daughter Dondra Spry, states she checked on her Mom frequently and pt was doing well. Son, [name] came 06/08/2012 and pt moved with him to [place] to stay with him. He states pt was not receiving the care and attention she needed in [place] therefore he had visited her 14 times over the past year and pt decided she would move with him to [place] to stay.  Home Assistive Devices / Equipment Home Assistive Devices/Equipment: Wheelchair;Shower chair with back Home Adaptive Equipment: None  Prior Functional Level Prior Function Level of Independence: Independent Able to Take Stairs?: Yes Driving: No Vocation: Retired  Current Functional Level Cognition  Arousal/Alertness: Awake/alert Overall Cognitive Status: Impaired Current Attention Level: Sustained Memory Deficits: no recall of information provided 2/20 (no recognition of RN that has covered patient x2 days in ago) Orientation Level: Oriented to person;Disoriented to place;Oriented to time;Oriented to situation Following Commands: Follows one step commands  consistently Safety/Judgement: Decreased awareness of safety precautions;Impulsive;Decreased safety judgement for tasks assessed;Decreased awareness of need for assistance Awareness of Errors: Assistance required to identify errors made;Assistance required to correct errors made Cognition - Other Comments: Reports Year as 1920s    Extremity Assessment (includes Sensation/Coordination)  RUE ROM/Strength/Tone: Within functional levels RUE Sensation: WFL - Light Touch RUE Coordination: WFL - gross/fine motor  RLE ROM/Strength/Tone: Deficits RLE ROM/Strength/Tone Deficits: 4+/5    ADLs  Eating/Feeding: Set up Where Assessed - Eating/Feeding: Chair Grooming: Wash/dry face;Teeth care;Wash/dry hands;Minimal assistance Where Assessed - Grooming: Supported standing Lower Body Dressing: Moderate assistance Where Assessed - Lower Body Dressing: Unsupported sitting (don socks) Toilet Transfer: Minimal assistance Toilet Transfer: Patient Percentage: 60% Toilet Transfer Method: Sit to Barista: Regular height toilet;Grab bars Toileting - Clothing Manipulation and Hygiene: Maximal assistance Where Assessed - Engineer, mining and Hygiene: Sit to stand  from 3-in-1 or toilet Equipment Used: Gait belt;Rolling walker Transfers/Ambulation Related to ADLs: Pt ambulating with RW and c/o Lt Knee pain.  ADL Comments: Pt static standing at sink with posterior lean and LOB. pt with LOB with single leg standing to operate foot pedals. pt needed min v/c for locating necessary ADL items. Pt unaware of surgery and initially reports no visual changes in appears. Pt with cueing states changes on Rt side of scalp. Pt educated on reason for admission, year, date and recent sx. Pt did however clearly and quickly respond with correct president name when asked.    Mobility  Bed Mobility: Not assessed Supine to Sit: 4: Min assist;With rails;HOB elevated Sitting - Scoot to Edge of Bed:  4: Min assist Sit to Supine: 3: Mod assist;HOB flat    Transfers  Transfers: Stand to Sit Sit to Stand: Other (comment) (pt up in bathroom with NT on arrival)) Sit to Stand: Patient Percentage: 60% Stand to Sit: 4: Min assist;With upper extremity assist;With armrests;To chair/3-in-1 Stand to Sit: Patient Percentage: 60%    Ambulation / Gait / Stairs / Wheelchair Mobility  Ambulation/Gait Ambulation/Gait Assistance: 3: Mod assist Ambulation Distance (Feet): 90 Feet Assistive device: Rolling walker;1 person hand held assist Ambulation/Gait Assistance Details: Pt initially pushing RW too far ahead and with difficulty coordinating use; switched to Rt HHA and pt was min assist x 40 ft, however then LLE fatigued with incr buckling; RW shortened and pt returned to room with RW Gait Pattern: Step-through pattern;Decreased stride length;Trunk flexed;Wide base of support Gait velocity: less than 1.8 ft/sec which puts her at risk for recurrent falls.     Posture / Balance Static Sitting Balance Static Sitting - Balance Support: Bilateral upper extremity supported;Feet supported Static Sitting - Level of Assistance: 5: Stand by assistance Static Sitting - Comment/# of Minutes: 5 mins EOB while preparing to stand and get OOB Dynamic Sitting Balance Dynamic Sitting - Balance Support: Feet supported (one upper extremity supported) Dynamic Sitting - Level of Assistance: 4: Min assist Dynamic Sitting - Comments: min guard assist while preforming functional task of attempting to put socks on in sitting.  Min assist to prevent LOB forward.   Static Standing Balance Static Standing - Balance Support: During functional activity;No upper extremity supported Static Standing - Level of Assistance: 4: Min assist Static Standing - Comment/# of Minutes: at sink doing ADLs with OT; leans posterior; x 4 minutes    Special needs/care consideration Bowel mgmt:incontinent at times Bladder mgmt:incontinent at  times Diabetic mgmt HGB A1C 16.1 on 06/21/12. Patient had not been taking her insulin. Pt had no PCP in [place] since moving from [facility] 06/08/2012.     Previous Home Environment Living Arrangements: Children Lives With: Son Available Help at Discharge: Family Type of Home: House Home Layout: One level Home Access: Stairs to enter Entrance Stairs-Rails: Right Entrance Stairs-Number of Steps: 3-4 Bathroom Shower/Tub: Tub/shower unit;Curtain Bathroom Toilet: Standard Home Care Services: No Additional Comments: patient was ambulating MOD I on d/c 06/23/12 with no DME Patient moved 06/08/2012 from [place] to [place] with son, ERIC.  Discharge Living Setting Plans for Discharge Living Setting: Lives with (comment) (son) Type of Home at Discharge: House Discharge Home Layout: One level Discharge Home Access: Stairs to enter Entrance Stairs-Rails: Right Entrance Stairs-Number of Steps: 3 to 4 Discharge Bathroom Shower/Tub: Tub/shower unit Discharge Bathroom Toilet: Standard Discharge Bathroom Accessibility: Yes How Accessible: Accessible via walker Do you have any problems obtaining your medications?: No  Son wishes for  pt to return home with him in [place] at d/c. He feels she was not getting attention she needed in [place]. Daughter, Dondra Spry, wishes for pt to move back to [facility] and live with her. Dondra Spry states she will do what her Mom wishes. Both [name] and Dondra Spry want to be involved in d/c planning.  Social/Family/Support Systems Patient Roles: Parent Contact Information: Laraina Sulton, son Anticipated Caregiver: son Anticipated Caregiver's Contact Information: home [phone] Ability/Limitations of Caregiver: none Caregiver Availability: 24/7 Discharge Plan Discussed with Primary Caregiver: Yes Is Caregiver In Agreement with Plan?: Yes Does Caregiver/Family have Issues with Lodging/Transportation while Pt is in Rehab?: No Larey Dresser, daughter in [place] wishes to be involved in d/c plans also. Patient did not have PCP in  [place] since move. SW, Macario Golds, has arranged for PCP at [facility] with first appointment 08/15/12 at 10 am.  Shari Heritage , [name], is a Curator, who works from his home. States he can give Mom 24/7 supervision.   Goals/Additional Needs Patient/Family Goal for Rehab: supervision PT, supervision OT, supervision SLP Expected length of stay: ELOS 7 to 10 days Dietary Needs: Dysphagia 3 diet with thin liquids Pt/Family Agrees to Admission and willing to participate: Yes Program Orientation Provided & Reviewed with Pt/Caregiver Including Roles  & Responsibilities: Yes   Decrease burden of Care through IP rehab admission: not applicable  Possible need for SNF placement upon discharge:not anticipated   Patient Condition: Please see physician update to information in consult dated 07/27/2012. CT scan today 07/30/12 shows increased subdural collection, mass effect, and shift. Neurologically pt remains stable per Dr. Jule Ser. Recommends monitoring the subdural collection with serial CT scans. Patient functionally with therapy is impulsive, min assist of one person, and needs redirection with tasks. Discussed with Dr. Riley Kill and feel pt stable to admit to inpatient rehabilitation today.   Preadmission Screen Completed By:  Clois Dupes, 07/30/2012 12:12 PM ______________________________________________________________________   Discussed status with Dr. Riley Kill on 07/30/2012 at  1212 and received telephone approval for admission today.  Admission Coordinator:  Clois Dupes, time 0981 Date 07/30/2012.

## 2012-07-30 NOTE — Progress Notes (Signed)
Patient will benefit from an inpt rehab admission before d/c home. Patient is in agreement. I spoke with son, [name], by phone and with daughter, Dondra Spry, and they are both in agreement. I will arrange admission for today. [phone]

## 2012-07-30 NOTE — Progress Notes (Signed)
Spoke with the patient's son by phone. Discussed his mother's progress, and a CT scan findings both from the initial postoperative CT, as well as this morning CT. I've explained to him that the subdural collection has reaccumulated, and that there is increased mass effect, however she has remained neurologically stable, and in fact is gradually improving. Therefore I've recommended to the patient and to her son that we transfer her to the [facility] rehabilitation center and continue to monitor her progress and CT scans. I've explained that the subdural collection may gradually resorb on its own over a period of weeks and months or it may require reoperation if she were to decline neurologically or if it were to persist and particularly increase. He understands our explanation, and his questions were answered for him, and he agreed to his mother's transfer to the [facility] rehabilitation center.  [name], MD 07/30/2012, 10:11 AM

## 2012-07-30 NOTE — Plan of Care (Signed)
Overall Plan of Care [facility]) Patient Details Name: April [name] MRN: [id number] DOB: Mar 21, 1922  Diagnosis:  Acute on chronic right frontoparietal SDH  Co-morbidities: htn, dm with dpn,   Functional Problem List  Patient demonstrates impairments in the following areas: Balance, Cognition, Edema, Endurance, Medication Management, Motor, Pain, Safety and Sensory   Basic ADL's: eating, grooming, bathing, dressing and toileting Advanced ADL's: simple meal preparation  Transfers:  bed mobility, bed to chair, toilet and tub/shower Locomotion:  ambulation, wheelchair mobility and stairs  Additional Impairments:  Swallowing, Communication  expression and Social Cognition   problem solving, memory, attention and awareness  Anticipated Outcomes Item Anticipated Outcome  Eating/Swallowing  Supervision  Basic self-care  supervision  Tolieting  supervision  Bowel/Bladder  Mod I  Transfers  supervision  Locomotion  supervision  Communication  Supervision  Cognition  Min A  Pain  Equal to or less than 3  Safety/Judgment    Other     Therapy Plan: PT Intensity: Minimum of 1-2 x/day ,45 to 90 minutes PT Frequency: 5 out of 7 days PT Duration Estimated Length of Stay: 1 week OT Intensity: Minimum of 1-2 x/day, 45 to 90 minutes OT Frequency: 5 out of 7 days OT Duration/Estimated Length of Stay: 7 days SLP Intensity: Minumum of 1-2 x/day, 30 to 90 minutes SLP Frequency: 5 out of 7 days SLP Duration/Estimated Length of Stay: 1 week    Team Interventions: Item RN PT OT SLP SW TR Other  Self Care/Advanced ADL Retraining   x      Neuromuscular Re-Education  x x      Therapeutic Activities  x x x     UE/LE Strength Training/ROM  x x      UE/LE Coordination Activities  x x      Visual/Perceptual Remediation/Compensation         DME/Adaptive Equipment Instruction  x x      Therapeutic Exercise  x x x     Balance/Vestibular Training  x x      Patient/Family Education x x x x      Cognitive Remediation/Compensation  x x x     Functional Mobility Training  x x      Ambulation/Gait Training  x       Museum/gallery curator  x       Wheelchair Propulsion/Positioning  x       Functional Tourist information centre manager Reintegration  x       Dysphagia/Aspiration Printmaker    x     Speech/Language Facilitation    x     Bladder Management         Bowel Management         Disease Management/Prevention x        Pain Management x  x      Medication Management x        Skin Care/Wound Management x        Splinting/Orthotics         Discharge Planning  x x x     Psychosocial Support  x x x                            Team Discharge Planning: Destination: PT-Home (vs. SNF pending 24 hour supervision) ,OT- Home , SLP-Home Projected Follow-up: PT-Home health PT (vs. SNF pending 24 hour supervision), OT-  Home health OT, SLP-Home Health SLP;24  hour supervision/assistance Projected Equipment Needs: PT- , OT-  , SLP-None recommended by SLP Patient/family involved in discharge planning: PT- Patient (family not present),  OT-Patient;Family member/caregiver, SLP-Patient  MD ELOS: one week Medical Rehab Prognosis:  Excellent Assessment: The patient has been admitted for CIR therapies. The team will be addressing, functional mobility, strength, stamina, balance, safety, adaptive techniques/equipment, self-care, bowel and bladder mgt, patient and caregiver education, NMR, pain mgt, visual-spatial awareness, cognition, communication, speech intellgibility. Goals have been set at supervision to mod I.      See Team Conference Notes for weekly updates to the plan of care

## 2012-07-30 NOTE — Interval H&P Note (Signed)
April [name] was admitted today to Inpatient Rehabilitation with the diagnosis of right acute on  Chronic subdural hemorrhage.  The patient's history has been reviewed, patient examined, and there is no change in status.  Patient continues to be appropriate for intensive inpatient rehabilitation.  I have reviewed the patient's chart and labs.  Questions were answered to the patient's satisfaction.  SWARTZ,ZACHARY T 07/30/2012, 11:04 PM

## 2012-07-30 NOTE — Progress Notes (Signed)
Pt found in floor, un-witnessed fall. Pt oriented to self, follows commands. Neuro checks intact; Skin clean dry and intact; Pt denies pain, H/A, or dizziness. VSS; Lift used to maneuver pt back into bed. Incision to head clean dry and intact; Pt in no distress. Joslyne Marshburn son notified. Harvel Ricks PA notified. Waist belt soft restraint to be applied to prevent further falls. Will continue to monitor pt.

## 2012-07-30 NOTE — Progress Notes (Signed)
Pt's staple were taken out, pt tolerated it well.------Trea Latner, rn

## 2012-07-30 NOTE — Progress Notes (Signed)
Report has been given to Clayton, the receiving rn on 4000. Pt will proceed to be transferred or moved to [id number].-----Brynn Reznik, rn

## 2012-07-30 NOTE — Progress Notes (Signed)
PT Cancellation Note  Patient Details Name: April [name] MRN: [id number] DOB: Sep 11, 1921   Cancelled Treatment:    Reason Treat Not Completed: Patient at procedure or test/unavailable: CT scan   Jadelin Eng 07/30/2012, 2:16 PM Pager [phone]

## 2012-07-30 NOTE — H&P (View-Only) (Signed)
Physical Medicine and Rehabilitation Admission H&P    Chief Complaint  Patient presents with  . Altered Mental Status  : HPI: April [name] is a 79 y.o. right-handed female with history of diabetes mellitus and peripheral neuropathy, hypertension and chronic renal insufficiency with baseline 1.80-2.10 as well his recent DVT with IVC filter placed. Admitted 07/23/2012 with noted history of large right hemispheric chronic subdural hematoma. She had been seen by neurosurgery in the past and surgeries not felt to be indicated. She now presents with increased left-sided weakness over the past 2 days and followup CT scan again shows chronic subdural hematoma with significant mass effect and midline shift. Patient underwent right frontoparietal craniotomy evacuation of subdural hematoma 07/23/2012 per Dr. Newell Coral. Placed on Keppra for seizure prophylaxis. Followup cranial CT scan 07/30/2012 does show some increase in size of right sided subdural collection as well as slight increase in size of blood collection. Dr. Newell Coral of neurosurgery has reviewed the film and neurologically patient looks good with plan to repeat cranial CT scan in one week. Patient is maintained on a dysphagia 3 thin liquid diet. Latest hemoglobin A1c of 12.3 and currently maintained on oral agents as well as sliding scale insulin. Physical and occupational therapy evaluations completed 07/26/2012 was noted ongoing bouts of confusion, problem solving and memory loss. Recommendations are made for physical medicine rehabilitation consult to consider inpatient rehabilitation services. Patient was felt to be a good candidate for inpatient rehabilitation services and was admitted for a comprehensive rehabilitation program.  Review of Systems  Musculoskeletal: Positive for myalgias.  Neurological: Positive for weakness and headaches.  All other systems reviewed and are negative   Past Medical History  Diagnosis Date  . Diabetes mellitus    . Hypertension   . Hyperlipidemia   . Pneumonia   . Subdural hematoma   . Arthritis   . DVT (deep venous thrombosis)   . Incontinence   . Renal disorder     chronic kidney dz stage III   Past Surgical History  Procedure Laterality Date  . No past surgeries    . Insertion of vena cava filter    . Craniotomy Right 07/23/2012    Procedure: CRANIOTOMY HEMATOMA EVACUATION SUBDURAL;  Surgeon: [name], MD;  Location: [facility];  Service: Neurosurgery;  Laterality: Right;   Family History  Problem Relation Age of Onset  . Diabetes Mellitus II Son    Social History:  reports that she has never smoked. She has never used smokeless tobacco. She reports that she does not drink alcohol or use illicit drugs. Allergies: No Known Allergies Medications Prior to Admission  Medication Sig Dispense Refill  . amLODipine (NORVASC) 10 MG tablet Take 1 tablet (10 mg total) by mouth daily.  30 tablet  1  . atorvastatin (LIPITOR) 40 MG tablet Take 40 mg by mouth every evening.      . carvedilol (COREG) 25 MG tablet Take 1 tablet (25 mg total) by mouth 2 (two) times daily with a meal.  60 tablet  3  . cloNIDine (CATAPRES - DOSED IN MG/24 HR) 0.3 mg/24hr Place 1 patch (0.3 mg total) onto the skin once a week.  4 patch  2  . insulin glargine (LANTUS) 100 UNIT/ML injection Inject 20 Units into the skin daily.      . sitaGLIPtin (JANUVIA) 50 MG tablet Take 1 tablet (50 mg total) by mouth daily.  30 tablet  1  . telmisartan (MICARDIS) 80 MG tablet Take 1 tablet (80  mg total) by mouth daily.  30 tablet  3    Home: Home Living Lives With: Son Available Help at Discharge: Family Type of Home: House Home Access: Stairs to enter Secretary/administrator of Steps: 3-4 Entrance Stairs-Rails: Right Home Layout: One level Bathroom Shower/Tub: Tub/shower unit;Curtain Bathroom Toilet: Standard Home Adaptive Equipment: None Additional Comments: patient was ambulating MOD I on d/c 06/23/12 with no DME    Functional History: Prior Function Able to Take Stairs?: Yes Driving: No Vocation: Retired  Functional Status:  Mobility: Bed Mobility Bed Mobility: Not assessed Supine to Sit: 4: Min assist;With rails;HOB elevated Sitting - Scoot to [place] of Bed: 4: Min assist Sit to Supine: 3: Mod assist;HOB flat Transfers Transfers: Stand to Sit Sit to Stand: Other (comment) (pt up in bathroom with NT on arrival)) Sit to Stand: Patient Percentage: 60% Stand to Sit: 4: Min assist;With upper extremity assist;With armrests;To chair/3-in-1 Stand to Sit: Patient Percentage: 60% Ambulation/Gait Ambulation/Gait Assistance: 3: Mod assist Ambulation Distance (Feet): 90 Feet Assistive device: Rolling walker;1 person hand held assist Ambulation/Gait Assistance Details: Pt initially pushing RW too far ahead and with difficulty coordinating use; switched to Rt HHA and pt was min assist x 40 ft, however then LLE fatigued with incr buckling; RW shortened and pt returned to room with RW Gait Pattern: Step-through pattern;Decreased stride length;Trunk flexed;Wide base of support Gait velocity: less than 1.8 ft/sec which puts her at risk for recurrent falls.     ADL: ADL Eating/Feeding: Set up Where Assessed - Eating/Feeding: Chair Grooming: Wash/dry face;Teeth care;Wash/dry hands;Minimal assistance Where Assessed - Grooming: Supported standing Lower Body Dressing: Moderate assistance Where Assessed - Lower Body Dressing: Unsupported sitting (don socks) Toilet Transfer: Minimal assistance Toilet Transfer Method: Sit to stand Toilet Transfer Equipment: Regular height toilet;Grab bars Equipment Used: Gait belt;Rolling walker Transfers/Ambulation Related to ADLs: Pt ambulating with RW and c/o Lt Knee pain.  ADL Comments: Pt static standing at sink with posterior lean and LOB. pt with LOB with single leg standing to operate foot pedals. pt needed min v/c for locating necessary ADL items. Pt unaware of surgery  and initially reports no visual changes in appears. Pt with cueing states changes on Rt side of scalp. Pt educated on reason for admission, year, date and recent sx. Pt did however clearly and quickly respond with correct president name when asked.  Cognition: Cognition Arousal/Alertness: Awake/alert Orientation Level: Oriented to person;Disoriented to place;Oriented to time;Oriented to situation Cognition Overall Cognitive Status: Impaired Area of Impairment: Attention;Memory;Problem solving Arousal/Alertness: Awake/alert Orientation Level: Disoriented to;Place;Time;Situation Behavior During Session: Flat affect Current Attention Level: Sustained Memory Deficits: no recall of information provided 2/20 (no recognition of RN that has covered patient x2 days in ago) Following Commands: Follows one step commands consistently Safety/Judgement: Decreased awareness of safety precautions;Impulsive;Decreased safety judgement for tasks assessed;Decreased awareness of need for assistance Awareness of Errors: Assistance required to identify errors made;Assistance required to correct errors made Problem Solving: slow processing Cognition - Other Comments: Reports Year as 1920s  Physical Exam: Blood pressure 182/69, pulse 81, temperature 98.1 F (36.7 C), temperature source Oral, resp. rate 20, height 5\' 1"  (1.549 m), weight 68.6 kg (151 lb 3.8 oz), SpO2 98.00%. Physical Exam  Vitals reviewed.  HENT: oral mucosa generally moist. White coating over the tongue. Staples intact to craniotomy site.  Eyes: EOM are normal.  Neck: Neck supple. No thyromegaly present.  Cardiovascular: Regular rhythm. No Murmurs, gallops Pulmonary/Chest: Effort normal and breath sounds normal. She has no wheezes.  Abdominal:  Soft. Bowel sounds are normal. She exhibits no distension. NT Neurological: She is alert.  Patient can provide her name and age. She could  state her date of birth with subtle cues. She knew she was in  a hospital but could not provide the name of the hospital. Could not tell me why she was here. She did follow simple commands. She did need frequent redirection to tasks. Decreased coordination with fine motor movements of the left arm, leg. Mild left facial droop. Left arm is grossly 4-/5. Left leg is 3+ to 4-.  Sensation is 1/2 on the left, but she does sense gross pain. Speech is dysarthric.  Results for orders placed during the hospital encounter of 07/23/12 (from the past 48 hour(s))  GLUCOSE, CAPILLARY     Status: Abnormal   Collection Time    07/28/12  8:40 AM      Result Value Range   Glucose-Capillary 193 (*) 70 - 99 mg/dL   Comment 1 Notify RN     Comment 2 Documented in Chart    GLUCOSE, CAPILLARY     Status: Abnormal   Collection Time    07/28/12 11:35 AM      Result Value Range   Glucose-Capillary 197 (*) 70 - 99 mg/dL   Comment 1 Notify RN     Comment 2 Documented in Chart    GLUCOSE, CAPILLARY     Status: Abnormal   Collection Time    07/28/12  4:12 PM      Result Value Range   Glucose-Capillary 142 (*) 70 - 99 mg/dL   Comment 1 Notify RN     Comment 2 Documented in Chart    GLUCOSE, CAPILLARY     Status: Abnormal   Collection Time    07/28/12  8:16 PM      Result Value Range   Glucose-Capillary 176 (*) 70 - 99 mg/dL  GLUCOSE, CAPILLARY     Status: Abnormal   Collection Time    07/29/12 12:05 AM      Result Value Range   Glucose-Capillary 174 (*) 70 - 99 mg/dL  GLUCOSE, CAPILLARY     Status: None   Collection Time    07/29/12  4:31 AM      Result Value Range   Glucose-Capillary 70  70 - 99 mg/dL  GLUCOSE, CAPILLARY     Status: Abnormal   Collection Time    07/29/12  7:17 AM      Result Value Range   Glucose-Capillary 114 (*) 70 - 99 mg/dL  GLUCOSE, CAPILLARY     Status: Abnormal   Collection Time    07/29/12 11:33 AM      Result Value Range   Glucose-Capillary 151 (*) 70 - 99 mg/dL  GLUCOSE, CAPILLARY     Status: Abnormal   Collection Time    07/29/12   4:32 PM      Result Value Range   Glucose-Capillary 201 (*) 70 - 99 mg/dL  GLUCOSE, CAPILLARY     Status: Abnormal   Collection Time    07/29/12 11:03 PM      Result Value Range   Glucose-Capillary 102 (*) 70 - 99 mg/dL   Comment 1 Documented in Chart     Comment 2 Notify RN    GLUCOSE, CAPILLARY     Status: Abnormal   Collection Time    07/30/12  1:50 AM      Result Value Range   Glucose-Capillary 122 (*) 70 - 99 mg/dL  Comment 1 Documented in Chart     Comment 2 Notify RN    GLUCOSE, CAPILLARY     Status: Abnormal   Collection Time    07/30/12  6:43 AM      Result Value Range   Glucose-Capillary 141 (*) 70 - 99 mg/dL   Comment 1 Documented in Chart     Comment 2 Notify RN     Ct Head Wo Contrast  07/30/2012  *RADIOLOGY REPORT*  Clinical Data: Followup evacuation subdural hematoma. Facial swelling.  CT HEAD WITHOUT CONTRAST  Technique:  Contiguous axial images were obtained from the base of the skull through the vertex without contrast.  Comparison: 07/25/2012.  Findings: Post right frontal craniotomy for resection of right- sided subdural hematoma.  Below the craniotomy flap, there is a complex blood and gas containing collection (containing dispersed surgical clips) having maximal thickness of 1.2 cm inferior component appears slightly more prominent than on prior examination.  Surgical drain has been removed.  Interval decrease in size of right hemispheric pneumocephalus. Interval increase in size of right-sided subdural predominately hypodense collection containing small areas hyperdensity (blood) now with maximal thickness 1.9 cm versus recent maximal thickness of 1.6 cm.  Local mass effect with compression of the right lateral ventricle.  Midline shift to the to the left of by 5.2 mm versus prior 3.9 mm.  Increase in size of the right temporalis muscle.  Increase in subcutaneous swelling right lateral orbital region.  This may reflect leakage of fluid through the craniotomy site.   Small vessel disease type changes without CT evidence of large acute infarct. No intracranial mass lesion detected on this unenhanced exam.  Vascular calcifications.  IMPRESSION: Increase in size of the right-sided subdural collection, slight increase in size of blood collection below the right-sided craniotomy site and slight increase in mass effect upon the right lateral ventricle and midline shift to the left.  Increase in size of the right temporalis muscle.  Increase in subcutaneous swelling right lateral orbital region.  This may reflect leakage of fluid through the craniotomy site  This has been made a PRA call report utilizing dashboard call feature.   Original Report Authenticated By: Lacy Duverney, M.D.     Post Admission Physician Evaluation: 1. Functional deficits secondary  to acute on chronic right fronto-parietal SDH. 2. Patient is admitted to receive collaborative, interdisciplinary care between the physiatrist, rehab nursing staff, and therapy team. 3. Patient's level of medical complexity and substantial therapy needs in context of that medical necessity cannot be provided at a lesser intensity of care such as a SNF. 4. Patient has experienced substantial functional loss from his/her baseline which was documented above under the "Functional History" and "Functional Status" headings.  Judging by the patient's diagnosis, physical exam, and functional history, the patient has potential for functional progress which will result in measurable gains while on inpatient rehab.  These gains will be of substantial and practical use upon discharge  in facilitating mobility and self-care at the household level. 5. Physiatrist will provide 24 hour management of medical needs as well as oversight of the therapy plan/treatment and provide guidance as appropriate regarding the interaction of the two. 6. 24 hour rehab nursing will assist with bladder management, bowel management, safety, skin/wound care,  disease management, medication administration, pain management and pain mgt  and help integrate therapy concepts, techniques,education, etc. 7. PT will assess and treat for/with: Lower extremity strength, range of motion, stamina, balance, functional mobility, safety, adaptive techniques and  equipment, NMR, education.   Goals are: supervision. 8. OT will assess and treat for/with: ADL's, functional mobility, safety, upper extremity strength, adaptive techniques and equipment, NMR, education.   Goals are: supervision. 9. SLP will assess and treat for/with: cognition and communication.  Goals are: supervision. 10. Case Management and Social Worker will assess and treat for psychological issues and discharge planning. 11. Team conference will be held weekly to assess progress toward goals and to determine barriers to discharge. 12. Patient will receive at least 3 hours of therapy per day at least 5 days per week. 13. ELOS: 7-10 days      Prognosis:  excellent   Medical Problem List and Plan: 1.Acute on chronic right SDH. Status post right frontal parietal craniotomy 07/23/2012. Plan followup cranial CT scan 08/05/2012 for monitoring of any increase in size of right sided subdural collection. Monitor closely for a neurological changes 2. DVT Prophylaxis/Anticoagulation: SCDs. Monitor for any signs of DVT 3. Pain Management: Tylenol as needed 4. Neuropsych: This patient is not capable of making decisions on his/her own behalf. 5. Seizure prophylaxis. Keppra 500 mg twice a day. Monitor for any seizure activity 6. Diabetes mellitus peripheral neuropathy. Hemoglobin A1c of 12.3. Tradjenta 5 mg daily. Check blood sugars a.c. and at bedtime 7. Hypertension. Coreg 25 mg twice a day, clonidine patch change every 7 days and Avapro 300 mg daily. Monitor with increased activity   Ranelle Oyster, MD, [place] [facility]  07/30/2012

## 2012-07-31 ENCOUNTER — Inpatient Hospital Stay (HOSPITAL_COMMUNITY): Payer: Medicare Other

## 2012-07-31 ENCOUNTER — Inpatient Hospital Stay (HOSPITAL_COMMUNITY): Payer: Medicare Other | Admitting: Physical Therapy

## 2012-07-31 ENCOUNTER — Inpatient Hospital Stay (HOSPITAL_COMMUNITY): Payer: Medicare Other | Admitting: Speech Pathology

## 2012-07-31 ENCOUNTER — Inpatient Hospital Stay (HOSPITAL_COMMUNITY): Payer: Medicare Other | Admitting: Occupational Therapy

## 2012-07-31 DIAGNOSIS — E1165 Type 2 diabetes mellitus with hyperglycemia: Secondary | ICD-10-CM

## 2012-07-31 DIAGNOSIS — S065X9A Traumatic subdural hemorrhage with loss of consciousness of unspecified duration, initial encounter: Secondary | ICD-10-CM

## 2012-07-31 DIAGNOSIS — I10 Essential (primary) hypertension: Secondary | ICD-10-CM

## 2012-07-31 LAB — COMPREHENSIVE METABOLIC PANEL
Albumin: 1.9 g/dL — ABNORMAL LOW (ref 3.5–5.2)
BUN: 23 mg/dL (ref 6–23)
Creatinine, Ser: 2.23 mg/dL — ABNORMAL HIGH (ref 0.50–1.10)
Potassium: 3.5 mEq/L (ref 3.5–5.1)
Total Protein: 6 g/dL (ref 6.0–8.3)

## 2012-07-31 LAB — CBC WITH DIFFERENTIAL/PLATELET
Basophils Absolute: 0 10*3/uL (ref 0.0–0.1)
Basophils Relative: 1 % (ref 0–1)
Eosinophils Absolute: 0.2 10*3/uL (ref 0.0–0.7)
Hemoglobin: 7.7 g/dL — ABNORMAL LOW (ref 12.0–15.0)
MCH: 26 pg (ref 26.0–34.0)
MCHC: 33.2 g/dL (ref 30.0–36.0)
Monocytes Absolute: 1.1 10*3/uL — ABNORMAL HIGH (ref 0.1–1.0)
Monocytes Relative: 16 % — ABNORMAL HIGH (ref 3–12)
Neutrophils Relative %: 58 % (ref 43–77)
RDW: 15.2 % (ref 11.5–15.5)

## 2012-07-31 LAB — GLUCOSE, CAPILLARY
Glucose-Capillary: 110 mg/dL — ABNORMAL HIGH (ref 70–99)
Glucose-Capillary: 192 mg/dL — ABNORMAL HIGH (ref 70–99)
Glucose-Capillary: 159 mg/dL — ABNORMAL HIGH (ref 70–99)
Glucose-Capillary: 252 mg/dL — ABNORMAL HIGH (ref 70–99)

## 2012-07-31 NOTE — Evaluation (Signed)
Speech Language Pathology Bedside Swallow Evaluation  Patient Details  Name: April [name] MRN: [id number] Date of Birth: April 01, 1922  SLP Diagnosis: Dysphagia;Dysarthria  Rehab Potential: Good ELOS: 1 week   Today's Date: 07/31/2012 Time: 4098-1191 Time Calculation (min): 60 min  Skilled Therapeutic Intervention: Administered BSE, please see below for details. Pt will participate in MBS today to assess possible dysphagia  Problem List:  Patient Active Problem List  Diagnosis  . Encephalopathy acute  . Community acquired pneumonia  . Diabetes mellitus  . Hyperlipidemia  . Hypertension  . SDH (subdural hematoma)  . Lower extremity edema  . Anemia  . ARF (acute renal failure)  . HTN (hypertension)  . DVT (deep venous thrombosis)  . CKD (chronic kidney disease) stage 3, GFR 30-59 ml/min  . Subdural hematoma   Past Medical History:  Past Medical History  Diagnosis Date  . Diabetes mellitus   . Hypertension   . Hyperlipidemia   . Pneumonia   . Subdural hematoma   . Arthritis   . DVT (deep venous thrombosis)   . Incontinence   . Renal disorder     chronic kidney dz stage III   Past Surgical History:  Past Surgical History  Procedure Laterality Date  . No past surgeries    . Insertion of vena cava filter    . Craniotomy Right 07/23/2012    Procedure: CRANIOTOMY HEMATOMA EVACUATION SUBDURAL;  Surgeon: [name], MD;  Location: [facility];  Service: Neurosurgery;  Laterality: Right;    Assessment / Plan / Recommendation Clinical Impression  Pt is an 79 y.o. right-handed female with history of diabetes mellitus and peripheral neuropathy, hypertension and chronic renal insufficiency with baseline 1.80-2.10 as well his recent DVT with IVC filter placed. Admitted 07/23/2012 with noted history of large right hemispheric chronic subdural hematoma. She had been seen by neurosurgery in the past and surgeries not felt to be indicated. She now presents with increased  left-sided weakness over the past 2 days and follow-up CT scan again shows chronic subdural hematoma with significant mass effect and midline shift. Patient underwent right fronto-parietal craniotomy evacuation of subdural hematoma 07/23/2012 per Dr. Newell Coral. Placed on Keppra for seizure prophylaxis. Follow-up cranial CT scan 07/30/2012 does show some increase in size of right sided subdural collection as well as slight increase in size of blood collection. Dr. Newell Coral of neurosurgery has reviewed the film and neurologically patient looks good with plan to repeat cranial CT scan in one week. Patient is maintained on a dysphagia 3 thin liquid diet. Latest hemoglobin A1c of 12.3 and currently maintained on oral agents as well as sliding scale insulin. Physical and occupational therapy evaluations completed 07/26/2012 was noted ongoing bouts of confusion, problem solving and memory loss. Recommendations are made for physical medicine rehabilitation consult to consider inpatient rehabilitation services. Patient was felt to be a good candidate for inpatient rehabilitation services and was admitted for a comprehensive rehabilitation program. Pt transferred to CIR 07/30/12 and presents with a moderate oral phase dysphagia characterized by decreased oral manipulation and mastication and delayed AP transit with mechanical soft textures. Pt also demonstrates a questionable pharyngeal phase dysphagia characterized by intermittent throat clearing and delayed cough with all consistencies presented. Recommend MBS to assess for possible pharyngeal dysphagia. Pt would benefit from skilled SLP intervention to maximize swallowing safety with least restrictive diet.     SLP Assessment  Patient will need skilled Speech Lanaguage Pathology Services during CIR admission    Recommendations  Recommended Consults: MBS  Diet Recommendations: Dysphagia 2 (Fine chop);Thin liquid Medication Administration: Whole meds with  puree Supervision: Patient able to self feed;Full supervision/cueing for compensatory strategies Compensations: Slow rate;Small sips/bites Postural Changes and/or Swallow Maneuvers: Seated upright 90 degrees;Upright 30-60 min after meal Oral Care Recommendations: Oral care QID;Oral care before and after PO Patient destination: Home Follow up Recommendations: Home Health SLP;24 hour supervision/assistance Equipment Recommended: None recommended by SLP    SLP Frequency 5 out of 7 days   SLP Treatment/Interventions Cueing hierarchy;Cognitive remediation/compensation;Environmental controls;Internal/external aids;Therapeutic Activities;Speech/Language facilitation;Patient/family education;Functional tasks;Dysphagia/aspiration precaution training;Therapeutic Exercise    Pain Pain Assessment Pain Assessment: No/denies pain  Prior Functioning Type of Home: House Lives With: Son Available Help at Discharge: Family Vocation: Retired  Teacher, music Term Goals: Week 1: SLP Short Term Goal 1 (Week 1): Pt will utilize swallowing compensatory strategies to minimize overt s/s of aspiration with Mod A verbal and visual cues.  SLP Short Term Goal 2 (Week 1): Pt will participate in an objective swallow study to assess dysphagia  See FIM for current functional status Refer to Care Plan for Long Term Goals  Recommendations for other services: None  Discharge Criteria: Patient will be discharged from SLP if patient refuses treatment 3 consecutive times without medical reason, if treatment goals not met, if there is a change in medical status, if patient makes no progress towards goals or if patient is discharged from hospital.  The above assessment, treatment plan, treatment alternatives and goals were discussed and mutually agreed upon: by patient  April [name] 07/31/2012, 11:09 AM

## 2012-07-31 NOTE — Procedures (Addendum)
Objective Swallowing Evaluation: Modified Barium Swallowing Study  Patient Details  Name: April [name] MRN: [id number] Date of Birth: Mar 23, 1922  Today's Date: 07/31/2012 Time: 1145- 1207 Time Calculation (min): 22 min  Past Medical History:  Past Medical History  Diagnosis Date  . Diabetes mellitus   . Hypertension   . Hyperlipidemia   . Pneumonia   . Subdural hematoma   . Arthritis   . DVT (deep venous thrombosis)   . Incontinence   . Renal disorder     chronic kidney dz stage III   Past Surgical History:  Past Surgical History  Procedure Laterality Date  . No past surgeries    . Insertion of vena cava filter    . Craniotomy Right 07/23/2012    Procedure: CRANIOTOMY HEMATOMA EVACUATION SUBDURAL;  Surgeon: [name], MD;  Location: [facility];  Service: Neurosurgery;  Laterality: Right;   HPI:        Recommendation/Prognosis  Clinical Impression Dysphagia Diagnosis: Mild oral phase dysphagia Clinical impression: Pt demonstrates a mild oral dysphagia with slow mastication and pumping behavior of tongue to transit solid bolus. Pt orally holds liquid boluses briefly prior to transit to pharynx; base of tongue contact to soft palate adequate to contain bolus and prevent spillage or delay in swallow initiation. Pharyngeal phase WFL (one episode of deep flash penetration).  There were no instances of cough or throat clearing duing exam.  Recommend pt consume a dys 3 (mechanical soft) diet with thin liquids with skilled SLP f/u to determine if texture is appropriate during meal times. Basic aspiraiton precautions.  Swallow Evaluation Recommendations Recommended Consults: MBS Diet Recommendations: Dysphagia 3 (Mechanical Soft);Thin liquid Liquid Administration via: Cup;Straw Medication Administration: Whole meds with liquid Supervision: Patient able to self feed;Intermittent supervision to cue for compensatory strategies Compensations: Slow rate;Small sips/bites Postural  Changes and/or Swallow Maneuvers: Seated upright 90 degrees;Out of bed for meals Oral Care Recommendations: Oral care BID Follow up Recommendations: Inpatient Rehab Prognosis Prognosis for Safe Diet Advancement: Good Individuals Consulted Consulted and Agree with Results and Recommendations: Patient  SLP Assessment/Plan Dysphagia Diagnosis: Mild oral phase dysphagia Clinical impression: Pt demonstrates a mild oral dysphagia with slow mastication and pumping behavior of tongue to transit solid bolus. Pt orally holds liquid boluses briefly prior to transit to pharynx; base of tongue contact to soft palate adequate to contain bolus and revent spillage or delay in swallow initiation. Pharyngeal phase WFL (one episode of deep flash penetration).  There were no instances of cough or throat clearing duing exam.  Recommend pt consume a dys 3 (mechanical soft) diet with thin liquids with skilled SLP f/u to determine if texture is appropriate during meal times. Basic aspiraiton precautions.   Short Term Goals: Week 1: SLP Short Term Goal 1 (Week 1): Pt will utilize swallowing compensatory strategies to minimize overt s/s of aspiration with Mod A verbal and visual cues.  SLP Short Term Goal 2 (Week 1): Pt will participate in an objective swallow study to assess dysphagia SLP Short Term Goal 2 - Progress (Week 1): Met  General:  Date of Onset: 07/31/12 Type of Study: Modified Barium Swallowing Study Reason for Referral: Objectively evaluate swallowing function Previous Swallow Assessment: BSE on 07/26/12 and recommended Dys. 3 textures and thin liquids Diet Prior to this Study: Dysphagia 2 (chopped);Thin liquids Temperature Spikes Noted: No Respiratory Status: Room air History of Recent Intubation: No Behavior/Cognition: Alert;Cooperative Oral Cavity - Dentition: Poor condition;Missing dentition Oral Motor / Sensory Function: Impaired - see Bedside  swallow eval Self-Feeding Abilities: Able to feed  self Patient Positioning: Upright in chair Baseline Vocal Quality: Clear;Low vocal intensity Volitional Cough: Strong Volitional Swallow: Able to elicit Anatomy: Within functional limits Pharyngeal Secretions: Not observed secondary MBS  Reason for Referral:  Objectively evaluate swallowing function   Oral Phase Oral Preparation/Oral Phase Oral Phase: Impaired Oral - Thin Oral - Thin Cup: Delayed oral transit;Other (Comment) ( holds bolus orally for 1-2 seconds prior to transit) Oral - Thin Straw: Delayed oral transit;Other (Comment) ( holds bolus orally for 1-2 seconds prior to transit) Oral - Solids Oral - Puree: Delayed oral transit;Other (Comment) ( holds bolus orally for 1-2 seconds prior to transit) Oral - Regular: Delayed oral transit;Reduced posterior propulsion;Lingual pumping (mastication slow but functional, no pocketing) Pharyngeal Phase  Pharyngeal Phase Pharyngeal Phase: Within functional limits (one episode of deep flash penetration) Cervical Esophageal Phase  Cervical Esophageal Phase Cervical Esophageal Phase: [facility] Harlon Ditty, MA CCC-SLP [phone]  Bharath Bernstein, Riley Nearing 07/31/2012, 12:42 PM

## 2012-07-31 NOTE — Evaluation (Signed)
Occupational Therapy Assessment and Plan  Patient Details  Name: April [name] MRN: [id number] Date of Birth: 1922-05-20  OT Diagnosis: cognitive deficits and muscle weakness (generalized) Rehab Potential: Rehab Potential: Good ELOS: 7 days   Today's Date: 07/31/2012 Time: 5621-3086 Time Calculation (min): 35 min  Problem List:  Patient Active Problem List  Diagnosis  . Encephalopathy acute  . Community acquired pneumonia  . Diabetes mellitus  . Hyperlipidemia  . Hypertension  . SDH (subdural hematoma)  . Lower extremity edema  . Anemia  . ARF (acute renal failure)  . HTN (hypertension)  . DVT (deep venous thrombosis)  . CKD (chronic kidney disease) stage 3, GFR 30-59 ml/min  . Subdural hematoma    Past Medical History:  Past Medical History  Diagnosis Date  . Diabetes mellitus   . Hypertension   . Hyperlipidemia   . Pneumonia   . Subdural hematoma   . Arthritis   . DVT (deep venous thrombosis)   . Incontinence   . Renal disorder     chronic kidney dz stage III   Past Surgical History:  Past Surgical History  Procedure Laterality Date  . No past surgeries    . Insertion of vena cava filter    . Craniotomy Right 07/23/2012    Procedure: CRANIOTOMY HEMATOMA EVACUATION SUBDURAL;  Surgeon: [name], MD;  Location: [facility];  Service: Neurosurgery;  Laterality: Right;    Assessment & Plan Clinical Impression: Patient is a 79 y.o. year old female right-handed female with history of diabetes mellitus and peripheral neuropathy, hypertension and chronic renal insufficiency with baseline 1.80-2.10 as well his recent DVT with IVC filter placed. Admitted 07/23/2012 with noted history of large right hemispheric chronic subdural hematoma. She had been seen by neurosurgery in the past and surgeries not felt to be indicated. She now presents with increased left-sided weakness over the past 2 days and followup CT scan again shows chronic subdural hematoma with  significant mass effect and midline shift. Patient underwent right frontoparietal craniotomy evacuation of subdural hematoma 07/23/2012 per Dr. Newell Coral. Placed on Keppra for seizure prophylaxis. Followup cranial CT scan 07/30/2012 does show some increase in size of right sided subdural collection as well as slight increase in size of blood collection. Dr. Newell Coral of neurosurgery has reviewed the film and neurologically patient looks good with plan to repeat cranial CT scan in one week. Patient is maintained on a dysphagia 3 thin liquid diet. Latest hemoglobin A1c of 12.3 and currently maintained on oral agents as well as sliding scale insulin. Physical and occupational therapy evaluations completed 07/26/2012 was noted ongoing bouts of confusion, problem solving and memory loss.   Patient transferred to CIR on 07/30/2012 .    Patient currently requires min with basic self-care skills and basic mobility secondary to muscle weakness, decreased cardiorespiratoy endurance, decreased coordination, decreased attention, decreased awareness, decreased problem solving, decreased safety awareness, decreased memory and delayed processing and decreased standing balance, decreased balance strategies and difficulty maintaining precautions.  Prior to hospitalization, patient could complete ADLs with supervision.  Patient will benefit from skilled intervention to decrease level of assist with basic self-care skills and increase independence with basic self-care skills prior to discharge home with care partner.  Anticipate patient will require 24 hour supervision and follow up home health.  OT - End of Session Activity Tolerance: Tolerates 30+ min activity with multiple rests Endurance Deficit: Yes OT Assessment Rehab Potential: Good OT Plan OT Intensity: Minimum of 1-2 x/day, 45 to 90  minutes OT Frequency: 5 out of 7 days OT Duration/Estimated Length of Stay: 7 days OT Treatment/Interventions: Balance/vestibular  training;Cognitive remediation/compensation;Discharge planning;DME/adaptive equipment instruction;Community reintegration;Functional mobility training;Pain management;Psychosocial support;Skin care/wound managment;Therapeutic Activities;Patient/family education;Self Care/advanced ADL retraining;Therapeutic Exercise;UE/LE Strength taining/ROM;UE/LE Coordination activities;Neuromuscular re-education OT Recommendation Patient destination: Home Follow Up Recommendations: Home health OT   Skilled Therapeutic Intervention   OT Evaluation Precautions/Restrictions  Precautions Precautions: Fall Precaution Comments: Decreased safety awareness, decreased awareness of deficits Restrictions Weight Bearing Restrictions: No General Chart Reviewed: Yes Family/Caregiver Present: Yes (son, [name]) Vital Signs   Pain Pain Assessment Pain Assessment: No/denies pain Pain Score:  (not rated, "all over" pain) Pain Intervention(s): RN made aware (called for medicatin) Home Living/Prior Functioning Home Living Lives With: Son Available Help at Discharge: Family Type of Home: House Home Access: Stairs to enter Secretary/administrator of Steps: 3-4 Entrance Stairs-Rails: Right Home Layout: One level Bathroom Shower/Tub: Engineer, manufacturing systems: Standard Home Adaptive Equipment: None Additional Comments: patient was ambulating MOD I on d/c 06/23/12 with no DME Prior Function Able to Take Stairs?: Yes Driving: No Vocation: Retired ADL   Vision/Perception  Vision - History Baseline Vision: Wears glasses only for reading Visual History:  (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) Patient Visual Report: No change from baseline Perception Perception: Within Functional Limits Praxis Praxis: Intact  Cognition Overall Cognitive Status: Impaired Arousal/Alertness: Awake/alert Orientation Level: Oriented to person;Oriented to place;Disoriented to situation Attention: Sustained Sustained  Attention: Impaired Sustained Attention Impairment: Verbal basic;Functional basic Memory: Impaired Memory Impairment: Decreased recall of new information;Decreased short term memory Decreased Short Term Memory: Verbal basic;Functional basic Awareness: Impaired Awareness Impairment: Intellectual impairment;Emergent impairment Problem Solving: Impaired Problem Solving Impairment: Verbal basic;Functional basic Behaviors: Impulsive (slightly impulsive) Safety/Judgment: Impaired Comments: Decreased awareness of deficits on evaluation Sensation Sensation Light Touch: Impaired Detail Light Touch Impaired Details: Impaired LUE Proprioception: Impaired Detail Proprioception Impaired Details: Impaired LUE Coordination Gross Motor Movements are Fluid and Coordinated: Yes Fine Motor Movements are Fluid and Coordinated: No (incoordination with rapid alternating movements) Motor  Motor Motor - Skilled Clinical Observations: generalized weakness Mobility  Bed Mobility Sitting - Scoot to Edge of Bed: 4: Min guard Sit to Supine: 4: Min assist Transfers Sit to Stand: 4: Min assist Stand to Sit: 4: Min assist  Trunk/Postural Assessment  Cervical Assessment Cervical Assessment: Within Functional Limits Thoracic Assessment Thoracic Assessment: Within Functional Limits Lumbar Assessment Lumbar Assessment: Within Functional Limits Postural Control Postural Control: Deficits on evaluation Righting Reactions: delayed Protective Responses: delayed  Balance Balance Balance Assessed: Yes Static Sitting Balance Static Sitting - Level of Assistance: 5: Stand by assistance Dynamic Sitting Balance Dynamic Sitting - Level of Assistance: 4: Min assist Static Standing Balance Static Standing - Level of Assistance: 4: Min assist Extremity/Trunk Assessment RUE Assessment RUE Assessment: Within Functional Limits LUE Assessment LUE Assessment:  (4/5 strength)  FIM:  FIM - Eating Eating Activity:  5: Supervision/cues FIM - Grooming Grooming Steps: Wash, rinse, dry face;Wash, rinse, dry hands;Oral care, brush teeth, clean dentures Grooming: 4: Steadying assist  or patient completes 3 of 4 or 4 of 5 steps FIM - Bathing Bathing Steps Patient Completed: Chest;Right Arm;Left Arm;Abdomen;Front perineal area;Right upper leg;Left upper leg Bathing: 3: Mod-Patient completes 5-7 82f 10 parts or 50-74% FIM - Upper Body Dressing/Undressing Upper body dressing/undressing steps patient completed: Thread/unthread right sleeve of pullover shirt/dresss;Thread/unthread left sleeve of pullover shirt/dress;Put head through opening of pull over shirt/dress;Pull shirt over trunk Upper body dressing/undressing: 5: Set-up assist to: Obtain clothing/put away FIM -  Lower Body Dressing/Undressing Lower body dressing/undressing steps patient completed: Thread/unthread left pants leg;Pull pants up/down;Fasten/unfasten pants;Thread/unthread right pants leg;Don/Doff right sock;Don/Doff left sock;Don/Doff right shoe;Don/Doff left shoe;Fasten/unfasten right shoe Lower body dressing/undressing: 4: Min-Patient completed 75 plus % of tasks FIM - Toileting Toileting steps completed by patient: Adjust clothing prior to toileting;Adjust clothing after toileting Toileting: 3: Mod-Patient completed 2 of 3 steps FIM - Bed/Chair Transfer Bed/Chair Transfer: 4: Sit > Supine: Min A (steadying pt. > 75%/lift 1 leg);4: Supine > Sit: Min A (steadying Pt. > 75%/lift 1 leg);4: Bed > Chair or W/C: Min A (steadying Pt. > 75%);4: Chair or W/C > Bed: Min A (steadying Pt. > 75%) FIM - Toilet Transfers Toilet Transfers: 4-To toilet/BSC: Min A (steadying Pt. > 75%);4-From toilet/BSC: Min A (steadying Pt. > 75%)   Refer to Care Plan for Long Term Goals  Recommendations for other services: Neuropsych when appropriate     Discharge Criteria: Patient will be discharged from OT if patient refuses treatment 3 consecutive times without medical  reason, if treatment goals not met, if there is a change in medical status, if patient makes no progress towards goals or if patient is discharged from hospital.  The above assessment, treatment plan, treatment alternatives and goals were discussed and mutually agreed upon: by patient and by family 1:1 OT eval initiated with OT goals, purpose and role discussed with pt and pt's son. Self care retraining with focus on functional ambulation with HHA with min A, sit to stands, standing balance for grooming, toileting, simple problem solving, donning clothing with steady A for standing balance, orientation, intellectual awareness.  Pt able to ambulated from room around dayroom and return to room with standing rest breaks along the way and mod directional cues for navigating with min HHA.   [name] [facility] 07/31/2012, 11:52 AM

## 2012-07-31 NOTE — Progress Notes (Signed)
Patient information reviewed and entered into eRehab system by Mckenlee Mangham, RN, CRRN, PPS Coordinator.  Information including medical coding and functional independence measure will be reviewed and updated through discharge.     Per nursing patient was given "Data Collection Information Summary for Patients in Inpatient Rehabilitation Facilities with attached "Privacy Act Statement-Health Care Records" upon admission.  

## 2012-07-31 NOTE — Progress Notes (Signed)
Subjective/Complaints: No major issues. Able to sleep. Does have headache.  A 12 point review of systems has been performed and if not noted above is otherwise negative.   Objective: Vital Signs: Blood pressure 178/69, pulse 78, temperature 98.1 F (36.7 C), temperature source Oral, resp. rate 20, height 5\' 2"  (1.575 m), SpO2 98.00%. Ct Head Wo Contrast  07/30/2012  *RADIOLOGY REPORT*  Clinical Data: Followup evacuation subdural hematoma. Facial swelling.  CT HEAD WITHOUT CONTRAST  Technique:  Contiguous axial images were obtained from the base of the skull through the vertex without contrast.  Comparison: 07/25/2012.  Findings: Post right frontal craniotomy for resection of right- sided subdural hematoma.  Below the craniotomy flap, there is a complex blood and gas containing collection (containing dispersed surgical clips) having maximal thickness of 1.2 cm inferior component appears slightly more prominent than on prior examination.  Surgical drain has been removed.  Interval decrease in size of right hemispheric pneumocephalus. Interval increase in size of right-sided subdural predominately hypodense collection containing small areas hyperdensity (blood) now with maximal thickness 1.9 cm versus recent maximal thickness of 1.6 cm.  Local mass effect with compression of the right lateral ventricle.  Midline shift to the to the left of by 5.2 mm versus prior 3.9 mm.  Increase in size of the right temporalis muscle.  Increase in subcutaneous swelling right lateral orbital region.  This may reflect leakage of fluid through the craniotomy site.  Small vessel disease type changes without CT evidence of large acute infarct. No intracranial mass lesion detected on this unenhanced exam.  Vascular calcifications.  IMPRESSION: Increase in size of the right-sided subdural collection, slight increase in size of blood collection below the right-sided craniotomy site and slight increase in mass effect upon the right  lateral ventricle and midline shift to the left.  Increase in size of the right temporalis muscle.  Increase in subcutaneous swelling right lateral orbital region.  This may reflect leakage of fluid through the craniotomy site  This has been made a PRA call report utilizing dashboard call feature.   Original Report Authenticated By: Lacy Duverney, M.D.     Recent Labs  07/31/12 0545  WBC 6.7  HGB 7.7*  HCT 23.2*  PLT 325    Recent Labs  07/31/12 0545  NA 141  K 3.5  CL 108  GLUCOSE 105*  BUN 23  CREATININE 2.23*  CALCIUM 8.7   CBG (last 3)   Recent Labs  07/30/12 2351 07/31/12 0347 07/31/12 0717  GLUCAP 91 110* 109*    Wt Readings from Last 3 Encounters:  07/23/12 68.6 kg (151 lb 3.8 oz)  07/23/12 68.6 kg (151 lb 3.8 oz)  07/04/12 63.3 kg (139 lb 8.8 oz)    Physical Exam:  HENT: oral mucosa generally moist. White coating over the tongue. Staples intact to craniotomy site with no drainage.   Eyes: EOM are normal.  Neck: Neck supple. No thyromegaly present.  Cardiovascular: Regular rhythm. No Murmurs, gallops  Pulmonary/Chest: Effort normal and breath sounds normal. She has no wheezes.  Abdominal: Soft. Bowel sounds are normal. She exhibits no distension. NT Neurological: She is somewhat alert (just waking up). Knows that she's in the hospital. She did follow simple commands. She did need frequent redirection to tasks. Decreased coordination with fine motor movements of the left arm, leg. Mild left facial droop. Left arm is grossly 4-/5. Left leg is 3+ to 4-. Sensation is 1/2 on the left, but she does sense gross pain. Speech  is dysarthric.   Assessment/Plan: 1. Functional deficits secondary to acute on chronic right frontal-parietal SDH which require 3+ hours per day of interdisciplinary therapy in a comprehensive inpatient rehab setting. Physiatrist is providing close team supervision and 24 hour management of active medical problems listed below. Physiatrist and  rehab team continue to assess barriers to discharge/monitor patient progress toward functional and medical goals. FIM:                   Comprehension Comprehension Mode: Auditory Comprehension: 5-Understands basic 90% of the time/requires cueing < 10% of the time  Expression Expression Mode: Verbal Expression: 5-Expresses basic 90% of the time/requires cueing < 10% of the time.  Social Interaction Social Interaction: 6-Interacts appropriately with others with medication or extra time (anti-anxiety, antidepressant).  Problem Solving Problem Solving: 5-Solves basic 90% of the time/requires cueing < 10% of the time  Memory Memory: 5-Recognizes or recalls 90% of the time/requires cueing < 10% of the time  Medical Problem List and Plan:  1. Acute on chronic right SDH. Status post right frontal parietal craniotomy 07/23/2012. Plan followup cranial CT scan 08/05/2012 for monitoring of any increase in size of right sided subdural collection. Monitor closely for a neurological changes  2. DVT Prophylaxis/Anticoagulation: SCDs. Monitor for any signs of DVT  3. Pain Management: Tylenol as needed  4. Neuropsych: This patient is not capable of making decisions on his/her own behalf.  5. Seizure prophylaxis. Keppra 500 mg twice a day. Monitor for any seizure activity  6. Diabetes mellitus peripheral neuropathy. Hemoglobin A1c of 12.3. Tradjenta 5 mg daily. Check blood sugars a.c. and at bedtime  -sugars under control at present. 7. Hypertension. Coreg 25 mg twice a day, clonidine patch change every 7 days and Avapro 300 mg daily. Monitor with increased activity  LOS (Days) 1 A FACE TO FACE EVALUATION WAS PERFORMED  Prathik Aman T 07/31/2012 7:52 AM

## 2012-07-31 NOTE — Progress Notes (Signed)
Physical Therapy Session Note  Patient Details  Name: April [name] MRN: [id number] Date of Birth: 1921-08-08  Today's Date: 07/31/2012 Time: 1445-1510 Time Calculation (min): 25 min  Short Term Goals: Week 1:  PT Short Term Goal 1 (Week 1): =long term goals  Skilled Therapeutic Interventions/Progress Updates:  Session limited by pt lethargy and sleepiness. Worked on generalized activity tolerance and problem solving with room negotiation and simulation of home environment tasks without device, mod assist for one significant lateral loss of balance however otherwise min assist. Pt required frequent seated rest breaks this session and fell asleep multiple times if seated. Pt returned to bed at end of session, soft waist belt restraint applied and bed alarm on.   Therapy Documentation Precautions:  Precautions Precautions: Fall Precaution Comments: Decreased safety awareness, decreased awareness of deficits Restrictions Weight Bearing Restrictions: No Pain: Pain Assessment Pain Assessment: No/denies pain  See FIM for current functional status  Therapy/Group: Individual Therapy  Wilhemina Bonito 07/31/2012, 3:26 PM

## 2012-07-31 NOTE — Plan of Care (Signed)
Problem: RH SAFETY Goal: RH STG ADHERE TO SAFETY PRECAUTIONS W/ASSISTANCE/DEVICE STG Adhere to Safety Precautions With SupervisionAssistance/Device.  Outcome: Not Progressing Patient requires total assist with safety needs waistbelt and quick release in use.

## 2012-07-31 NOTE — Evaluation (Signed)
Physical Therapy Assessment and Plan  Patient Details  Name: April [name] MRN: [id number] Date of Birth: [birth year]  PT Diagnosis: Abnormality of gait, Cognitive deficits, Difficulty walking, Impaired cognition, Impaired sensation and Muscle weakness Rehab Potential: Excellent ELOS: 1 week   Today's Date: 07/31/2012 Time: 0730-0828 Time Calculation (min): 58 min  Problem List:  Patient Active Problem List  Diagnosis  . Encephalopathy acute  . Community acquired pneumonia  . Diabetes mellitus  . Hyperlipidemia  . Hypertension  . SDH (subdural hematoma)  . Lower extremity edema  . Anemia  . ARF (acute renal failure)  . HTN (hypertension)  . DVT (deep venous thrombosis)  . CKD (chronic kidney disease) stage 3, GFR 30-59 ml/min  . Subdural hematoma    Past Medical History:  Past Medical History  Diagnosis Date  . Diabetes mellitus   . Hypertension   . Hyperlipidemia   . Pneumonia   . Subdural hematoma   . Arthritis   . DVT (deep venous thrombosis)   . Incontinence   . Renal disorder     chronic kidney dz stage III   Past Surgical History:  Past Surgical History  Procedure Laterality Date  . No past surgeries    . Insertion of vena cava filter    . Craniotomy Right 07/23/2012    Procedure: CRANIOTOMY HEMATOMA EVACUATION SUBDURAL;  Surgeon: [name], MD;  Location: [facility];  Service: Neurosurgery;  Laterality: Right;    Assessment & Plan Clinical Impression: April [name] is a 79 y.o. right-handed female with history of diabetes mellitus and peripheral neuropathy, hypertension and chronic renal insufficiency with baseline 1.80-2.10 as well his recent DVT with IVC filter placed. Admitted 07/23/2012 with noted history of large right hemispheric chronic subdural hematoma. She had been seen by neurosurgery in the past and surgeries not felt to be indicated. She now presents with increased left-sided weakness over the past 2 days and followup CT scan again  shows chronic subdural hematoma with significant mass effect and midline shift. Patient underwent right frontoparietal craniotomy evacuation of subdural hematoma 07/23/2012 per Dr. Newell Coral. Placed on Keppra for seizure prophylaxis. Followup cranial CT scan 07/30/2012 does show some increase in size of right sided subdural collection as well as slight increase in size of blood collection. Dr. Newell Coral of neurosurgery has reviewed the film and neurologically patient looks good with plan to repeat cranial CT scan in one week. Patient is maintained on a dysphagia 3 thin liquid diet. Latest hemoglobin A1c of 12.3 and currently maintained on oral agents as well as sliding scale insulin. Patient transferred to CIR on 07/30/2012 .   Patient currently requires mod/min assist with mobility secondary to muscle weakness, impaired timing and sequencing, unbalanced muscle activation, motor apraxia, decreased coordination and decreased motor planning, decreased problem solving, decreased safety awareness, decreased memory and delayed processing and decreased standing balance, decreased postural control and decreased balance strategies. Pt currently most limited by impaired cognition, decreased safety awareness and mildly impaired by balance and strength deficits. Prior to hospitalization, patient was living with son, unclear if under 24 hour supervision and lived with Son in a House home.  Home access is 3-4Stairs to enter.  Patient will benefit from skilled PT intervention to maximize safe functional mobility, minimize fall risk and decrease caregiver burden for planned discharge home with 24 hour supervision.  Anticipate patient will benefit from follow up HH at discharge.  PT - End of Session Endurance Deficit: Yes PT Assessment Rehab Potential: Excellent Barriers to  Discharge: Decreased caregiver support PT Plan PT Intensity: Minimum of 1-2 x/day ,45 to 90 minutes PT Frequency: 5 out of 7 days PT Duration  Estimated Length of Stay: 1 week PT Treatment/Interventions: Ambulation/gait training;Balance/vestibular training;Cognitive remediation/compensation;Community reintegration;Discharge planning;Neuromuscular re-education;Functional mobility training;DME/adaptive equipment instruction;Patient/family education;Psychosocial support;UE/LE Strength taining/ROM;UE/LE Coordination activities;Therapeutic Exercise;Therapeutic Activities;Wheelchair propulsion/positioning;Stair training PT Recommendation Follow Up Recommendations: Home health PT (vs. SNF pending 24 hour supervision) Patient destination: Home (vs. SNF pending 24 hour supervision) Equipment Details: to be determined   PT Evaluation Precautions/Restrictions Precautions Precautions: Fall Precaution Comments: Decreased safety awareness, decreased awareness of deficits Restrictions Weight Bearing Restrictions: No General   Vital Signs  Pain Pain Assessment Pain Assessment: No/denies pain Pain Score:  (not rated, "all over" pain) Pain Intervention(s): RN made aware (called for medicatin) Home Living/Prior Functioning Home Living Lives With: Son Available Help at Discharge: Family Type of Home: House Home Access: Stairs to enter Secretary/administrator of Steps: 3-4 Entrance Stairs-Rails: Right Home Layout: One level Bathroom Shower/Tub: Engineer, manufacturing systems: Standard Home Adaptive Equipment: None Additional Comments: patient was ambulating MOD I on d/c 06/23/12 with no DME Prior Function Able to Take Stairs?: Yes Driving: No Vocation: Retired Optometrist - History Baseline Vision: Wears glasses only for reading Visual History:  (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) Patient Visual Report: No change from baseline Perception Perception: Within Functional Limits Praxis Praxis: Intact  Cognition Overall Cognitive Status: Impaired Arousal/Alertness: Awake/alert Orientation Level: Oriented to  person;Oriented to place;Disoriented to situation Attention: Sustained Sustained Attention: Impaired Sustained Attention Impairment: Verbal basic;Functional basic Memory: Impaired Memory Impairment: Decreased recall of new information;Decreased short term memory Decreased Short Term Memory: Verbal basic;Functional basic Awareness: Impaired Awareness Impairment: Intellectual impairment;Emergent impairment Problem Solving: Impaired Problem Solving Impairment: Verbal basic;Functional basic Behaviors: Impulsive (slightly impulsive) Safety/Judgment: Impaired Comments: Decreased awareness of deficits on evaluation Sensation Sensation Light Touch: Impaired Detail Light Touch Impaired Details: Impaired LUE Proprioception: Impaired Detail Proprioception Impaired Details: Impaired LUE Coordination Gross Motor Movements are Fluid and Coordinated: Yes Fine Motor Movements are Fluid and Coordinated: No (incoordination with rapid alternating movements) Motor  Motor Motor - Skilled Clinical Observations: generalized weakness  Mobility Bed Mobility Bed Mobility: Supine to Sit Supine to Sit: 5: Supervision Supine to Sit Details (indicate cue type and reason): Decreased efficiency noted, decreased ability to follow commands initially. Sitting - Scoot to Edge of Bed: 4: Min guard Sit to Supine: 5: Supervision Transfers Sit to Stand: 3: Mod assist;4: Min assist Sit to Stand Details (indicate cue type and reason): Mod assist for inital stand secondary to impaired balance (posterior lean, lateral sway). Subsequent stands required only min assist/min-guard assist. Stand to Sit: 4: Min assist Stand to Sit Details: Min assist for safety as pt does not always square with surface prior to sitting. Stand Pivot Transfers: 4: Min assist Locomotion  Ambulation Ambulation: Yes Ambulation/Gait Assistance: 4: Min assist Ambulation Distance (Feet): 60 Feet Assistive device: 1 person hand held  assist Ambulation/Gait Assistance Details: Verbal/tactile cues for upright posture. Gait improved with repetition.  Gait Gait: Yes Gait Pattern: Impaired Gait Pattern: Step-through pattern;Decreased stride length;Trunk flexed (minimal foot clearance bilaterally) Stairs / Additional Locomotion Stairs: Yes Stairs Assistance: 3: Mod assist Stairs Assistance Details (indicate cue type and reason): Mod assist for descent of 6" steps, otherwise pt only required min assist and bil. railings. Stair Management Technique: Two rails;Alternating pattern;Forwards Number of Stairs: 5 Corporate treasurer: Yes Wheelchair Assistance: 4: Administrator, sports Details: Tactile cues  for sequencing;Verbal cues for technique;Verbal cues for precautions/safety;Verbal cues for sequencing;Verbal cues for safe use of DME/AE Wheelchair Propulsion: Both upper extremities  Trunk/Postural Assessment  Cervical Assessment Cervical Assessment: Within Functional Limits Thoracic Assessment Thoracic Assessment: Within Functional Limits Lumbar Assessment Lumbar Assessment: Within Functional Limits Postural Control Postural Control: Deficits on evaluation Righting Reactions: delayed Protective Responses: delayed  Balance Balance Balance Assessed: Yes Static Sitting Balance Static Sitting - Level of Assistance: 5: Stand by assistance Dynamic Sitting Balance Dynamic Sitting - Level of Assistance: 4: Min assist Static Standing Balance Static Standing - Level of Assistance: 4: Min assist Dynamic Standing Balance Dynamic Standing - Balance Support: During functional activity Dynamic Standing - Level of Assistance: 4: Min assist Extremity Assessment  RUE Assessment RUE Assessment: Within Functional Limits LUE Assessment LUE Assessment:  (4/5 strength) RLE Assessment RLE Assessment: Within Functional Limits LLE Assessment LLE Assessment: Exceptions to [facility] LLE Strength LLE Overall  Strength Comments: Functionally has minimally detectable difference however with MMT has 4/5 strength  FIM:  FIM - Bed/Chair Transfer Bed/Chair Transfer: 5: Supine > Sit: Supervision (verbal cues/safety issues);5: Sit > Supine: Supervision (verbal cues/safety issues);3: Bed > Chair or W/C: Mod A (lift or lower assist);4: Chair or W/C > Bed: Min A (steadying Pt. > 75%) FIM - Locomotion: Wheelchair Locomotion: Wheelchair: 2: Travels 50 - 149 ft with minimal assistance (Pt.>75%) FIM - Locomotion: Ambulation Locomotion: Ambulation Assistive Devices: Other (comment) (hand hold assist) Ambulation/Gait Assistance: 4: Min assist Locomotion: Ambulation: 2: Travels 50 - 149 ft with minimal assistance (Pt.>75%) FIM - Locomotion: Stairs Locomotion: Building control surveyor: Hand rail - 2 Locomotion: Stairs: 2: Up and Down 4 - 11 stairs with moderate assistance (Pt: 50 - 74%)   Refer to Care Plan for Long Term Goals   Skilled Therapeutic Interventions/Progress Updates:  Session focused on safety awareness, utilization and safety of wheelchair and sit <> stands. Pt with decreased short term carry over of instruction with brakes, also tries to place foot on wheelchair wheel to try to stand up. Pt has good function with familiar tasks, unfamiliar tasks pt has poor recall and is less safe which may make utilization of an assistive device difficult. Pt performed bathroom mobility with min assist, required safety cues for use of rails and navigation. Sit <> stand from toilet with min assist. Worked on finding objects in room, pt with decreased ability to problem solve this morning however progressed as session continued.   Recommendations for other services: None  Discharge Criteria: Patient will be discharged from PT if patient refuses treatment 3 consecutive times without medical reason, if treatment goals not met, if there is a change in medical status, if patient makes no progress towards goals or if  patient is discharged from hospital.  The above assessment, treatment plan, treatment alternatives and goals were discussed and mutually agreed upon: by patient, family not available  Wilhemina Bonito 07/31/2012, 12:28 PM

## 2012-08-01 ENCOUNTER — Inpatient Hospital Stay (HOSPITAL_COMMUNITY): Payer: Medicare Other | Admitting: Physical Therapy

## 2012-08-01 ENCOUNTER — Encounter (HOSPITAL_COMMUNITY): Payer: Medicare Other | Admitting: Occupational Therapy

## 2012-08-01 ENCOUNTER — Inpatient Hospital Stay (HOSPITAL_COMMUNITY): Payer: Medicare Other | Admitting: Speech Pathology

## 2012-08-01 ENCOUNTER — Inpatient Hospital Stay (HOSPITAL_COMMUNITY): Payer: Medicare Other | Admitting: *Deleted

## 2012-08-01 ENCOUNTER — Inpatient Hospital Stay (HOSPITAL_COMMUNITY): Payer: Medicare Other

## 2012-08-01 DIAGNOSIS — I10 Essential (primary) hypertension: Secondary | ICD-10-CM

## 2012-08-01 DIAGNOSIS — S065X9A Traumatic subdural hemorrhage with loss of consciousness of unspecified duration, initial encounter: Secondary | ICD-10-CM

## 2012-08-01 DIAGNOSIS — W19XXXA Unspecified fall, initial encounter: Secondary | ICD-10-CM

## 2012-08-01 DIAGNOSIS — E1165 Type 2 diabetes mellitus with hyperglycemia: Secondary | ICD-10-CM

## 2012-08-01 LAB — GLUCOSE, CAPILLARY
Glucose-Capillary: 103 mg/dL — ABNORMAL HIGH (ref 70–99)
Glucose-Capillary: 110 mg/dL — ABNORMAL HIGH (ref 70–99)
Glucose-Capillary: 235 mg/dL — ABNORMAL HIGH (ref 70–99)

## 2012-08-01 MED ORDER — INSULIN ASPART 100 UNIT/ML ~~LOC~~ SOLN
0.0000 [IU] | Freq: Three times a day (TID) | SUBCUTANEOUS | Status: DC
  Administered 2012-08-02: 3 [IU] via SUBCUTANEOUS
  Administered 2012-08-02: 2 [IU] via SUBCUTANEOUS
  Administered 2012-08-02: 5 [IU] via SUBCUTANEOUS
  Administered 2012-08-02 – 2012-08-03 (×2): 3 [IU] via SUBCUTANEOUS
  Administered 2012-08-03 – 2012-08-04 (×2): 2 [IU] via SUBCUTANEOUS
  Administered 2012-08-04 – 2012-08-05 (×3): 3 [IU] via SUBCUTANEOUS
  Administered 2012-08-06: 2 [IU] via SUBCUTANEOUS

## 2012-08-01 MED ORDER — HYDROCHLOROTHIAZIDE 25 MG PO TABS
12.5000 mg | ORAL_TABLET | Freq: Every day | ORAL | Status: DC
  Administered 2012-08-01: 12.5 mg via ORAL
  Filled 2012-08-01 (×4): qty 0.5

## 2012-08-01 MED ORDER — IPRATROPIUM-ALBUTEROL 20-100 MCG/ACT IN AERS: 2.0000 | INHALATION_SPRAY | Freq: Four times a day (QID) | RESPIRATORY_TRACT | Status: DC | PRN

## 2012-08-01 NOTE — Plan of Care (Signed)
Problem: RH SKIN INTEGRITY Goal: RH STG ABLE TO PERFORM INCISION/WOUND CARE W/ASSISTANCE STG Able To Perform Incision/Wound Care Without Assistance.  Outcome: Not Progressing Staff to care for blistered area lft shin  Problem: RH SAFETY Goal: RH STG ADHERE TO SAFETY PRECAUTIONS W/ASSISTANCE/DEVICE STG Adhere to Safety Precautions With SupervisionAssistance/Device.  Outcome: Not Progressing Using restraints due to impulsiveness and quick to rise without calling for  Assist in addition to QR in chair; sit at bedside

## 2012-08-01 NOTE — Patient Care Conference (Signed)
Inpatient RehabilitationTeam Conference and Plan of Care Update Date: 07/31/2012   Time: 2:50 PM    Patient Name: April [name]      Medical Record Number: [id number]  Date of Birth: 09/14/1921 Sex: Female         Room/Bed: 4028/4028-01 Payor Info: Payor: MEDICARE  Plan: MEDICARE PART A AND B  Product Type: *No Product type*     Admitting Diagnosis: SDH CHRONIC, SP CRANI  Admit Date/Time:  07/30/2012  3:03 PM Admission Comments: No comment available   Primary Diagnosis:  Subdural hematoma Principal Problem: Subdural hematoma  Patient Active Problem List   Diagnosis Date Noted  . Subdural hematoma 07/30/2012  . DVT (deep venous thrombosis) 07/04/2012  . CKD (chronic kidney disease) stage 3, GFR 30-59 ml/min 07/04/2012  . Lower extremity edema 07/03/2012  . Anemia 07/03/2012  . ARF (acute renal failure) 07/03/2012  . HTN (hypertension) 07/03/2012  . SDH (subdural hematoma) 06/21/2012  . Encephalopathy acute 06/20/2012  . Community acquired pneumonia 06/20/2012  . Diabetes mellitus 06/20/2012  . Hyperlipidemia 06/20/2012  . Hypertension 06/20/2012    Expected Discharge Date: Expected Discharge Date: 08/07/12  Team Members Present: Physician leading conference: Dr. Faith Rogue Social Worker Present: Amada Jupiter, LCSW PT Present: Reggy Eye, PT OT Present: Mackie Pai, Marye Round, OT SLP Present: Feliberto Gottron, SLP Other (Discipline and Name): Tora Duck, PPS Cooridator     Current Status/Progress Goal Weekly Team Focus  Medical   acute on chronic right SDH, s/p evacuation, persistent edema, bleed  improve safety, balance, control pain. increase cognitiion  safety. close monitoring of neuro status   Bowel/Bladder   pt incontinent of B&B  continent of B&B  continent of B&B   Swallow/Nutrition/ Hydration   Dys. 2 textures with thin liquids, Max A for utilization of compensatory strategies  Supervision  Utilization of swallowing compensatory strategies     ADL's   min A   supervision   standing balance, sit to stands, orientation, awareness, problem solving   Mobility   mod assist initially, min assist through rest of session  supervision  improve safety awareness, increase strenght, endurance and balance to decrease risk for falls.    Communication   Mod A  Min A  over articulation to increase intelligibility   Safety/Cognition/ Behavioral Observations  max A  Min A  problem solving, sustained attention, orientation   Pain   pt denies pain; Tylenol 325-650mg  po Q4hr prn   pain level <3  assess and evaluate pain level   Skin   R scalp incision clean, dry and intact; L knee allevyn dressing in place;   incision and skin to remain clean dry and intact/ free from infection  incision to remain clean dry and intact/free from infection    Rehab Goals Patient on target to meet rehab goals: Yes *See Interdisciplinary Assessment and Plan and progress notes for long and short-term goals  Barriers to Discharge: cognitive deficits, safety awareness    Possible Resolutions to Barriers:  supervision  at home, NMR    Discharge Planning/Teaching Needs:  Home with son who ensures he will provide/ arrange 24/7 assistance/ supervision.      Team Discussion:  Slightly increased edema on CT - monitor.  Ambulating with HHA and doing very well.  MBS today.  Anticipate able to reach superivision goals.  Revisions to Treatment Plan:  None   Continued Need for Acute Rehabilitation Level of Care: The patient requires daily medical management by a physician with specialized training  in physical medicine and rehabilitation for the following conditions: Daily direction of a multidisciplinary physical rehabilitation program to ensure safe treatment while eliciting the highest outcome that is of practical value to the patient.: Yes Daily medical management of patient stability for increased activity during participation in an intensive rehabilitation regime.:  Yes Daily analysis of laboratory values and/or radiology reports with any subsequent need for medication adjustment of medical intervention for : Neurological problems;Post surgical problems (wound care, pain mgt)  Angelos Wasco 08/01/2012, 11:31 AM

## 2012-08-01 NOTE — Progress Notes (Signed)
Occupational Therapy Session Note  Patient Details  Name: Deerica Waszak MRN: [id number] Date of Birth: 1922-02-02  Today's Date: 08/01/2012 Time: 1030-1130 Time Calculation (min): 60 min  Short Term Goals: Week 1:  OT Short Term Goal 1 (Week 1): STG=LTG; overall supervision  Skilled Therapeutic Interventions/Progress Updates:  Pt. participated in  1:1 session in room to work on self care retraining at shower level with a focus on orientation to situation, safety awareness, sit to stands (min steady A with cuing for anterior lean), activity tolerance, and functional mobility from w/c<> shower with hand held assist. Performed bathing and dressing at shower level with supervision, and occasional min A and VC for safety strategies for washing LB (prevent hitting head on grab bars). Needed VC and slight steady assist with standing balance strategies with clothing management (pulling up pants). Towards end of session, worked on attention to task, simple problem solving, sorting, and standing balance/tolerance playing uno on a raised table. Pt. With increased fatigue and anterior spillage from mouth needing mod VC to participate. RN made aware.  Therapy Documentation Precautions:  Precautions Precautions: Fall Precaution Comments: Decreased safety awareness, decreased awareness of deficits Restrictions Weight Bearing Restrictions: No Pain: Pain Assessment Pain Assessment: No/denies pain  See FIM for current functional status  Therapy/Group: Individual Therapy  Avaleen Brownley OTA/S Occupational Therapy Assistant Student  08/01/2012, 1:15 PM

## 2012-08-01 NOTE — Progress Notes (Addendum)
Subjective/Complaints: Had a good night. No complaints. Felt she did well with therapies yesterday  A 12 point review of systems has been performed and if not noted above is otherwise negative.   Objective: Vital Signs: Blood pressure 189/62, pulse 82, temperature 98.2 F (36.8 C), temperature source Oral, resp. rate 17, height 5\' 2"  (1.575 m), SpO2 97.00%. Dg Swallowing Func-speech Pathology  07/31/2012  Riley Nearing Deblois, CCC-SLP     07/31/2012  2:14 PM Objective Swallowing Evaluation: Modified Barium Swallowing Study   Patient Details  Name: April [name] MRN: [id number] Date of Birth: Jan 14, 1922  Today's Date: 07/31/2012 Time: 1145- 1207 Time Calculation (min): 22 min  Past Medical History:  Past Medical History  Diagnosis Date  . Diabetes mellitus   . Hypertension   . Hyperlipidemia   . Pneumonia   . Subdural hematoma   . Arthritis   . DVT (deep venous thrombosis)   . Incontinence   . Renal disorder     chronic kidney dz stage III   Past Surgical History:  Past Surgical History  Procedure Laterality Date  . No past surgeries    . Insertion of vena cava filter    . Craniotomy Right 07/23/2012    Procedure: CRANIOTOMY HEMATOMA EVACUATION SUBDURAL;  Surgeon:  [name], MD;  Location: [facility];  Service:  Neurosurgery;  Laterality: Right;   HPI:        Recommendation/Prognosis  Clinical Impression Dysphagia Diagnosis: Mild oral phase dysphagia Clinical impression: Pt demonstrates a mild oral dysphagia with  slow mastication and pumping behavior of tongue to transit solid  bolus. Pt orally holds liquid boluses briefly prior to transit to  pharynx; base of tongue contact to soft palate adequate to  contain bolus and prevent spillage or delay in swallow  initiation. Pharyngeal phase WFL (one episode of deep flash  penetration).  There were no instances of cough or throat  clearing duing exam.  Recommend pt consume a dys 3 (mechanical  soft) diet with thin liquids with skilled SLP f/u to  determine if  texture is appropriate during meal times. Basic aspiraiton  precautions.  Swallow Evaluation Recommendations Recommended Consults: MBS Diet Recommendations: Dysphagia 3 (Mechanical Soft);Thin liquid Liquid Administration via: Cup;Straw Medication Administration: Whole meds with liquid Supervision: Patient able to self feed;Intermittent supervision  to cue for compensatory strategies Compensations: Slow rate;Small sips/bites Postural Changes and/or Swallow Maneuvers: Seated upright 90  degrees;Out of bed for meals Oral Care Recommendations: Oral care BID Follow up Recommendations: Inpatient Rehab Prognosis Prognosis for Safe Diet Advancement: Good Individuals Consulted Consulted and Agree with Results and Recommendations: Patient  SLP Assessment/Plan Dysphagia Diagnosis: Mild oral phase dysphagia Clinical impression: Pt demonstrates a mild oral dysphagia with  slow mastication and pumping behavior of tongue to transit solid  bolus. Pt orally holds liquid boluses briefly prior to transit to  pharynx; base of tongue contact to soft palate adequate to  contain bolus and revent spillage or delay in swallow initiation.  Pharyngeal phase WFL (one episode of deep flash penetration).   There were no instances of cough or throat clearing duing exam.   Recommend pt consume a dys 3 (mechanical soft) diet with thin  liquids with skilled SLP f/u to determine if texture is  appropriate during meal times. Basic aspiraiton precautions.   Short Term Goals: Week 1: SLP Short Term Goal 1 (Week 1): Pt will utilize  swallowing compensatory strategies to minimize overt s/s of  aspiration with Mod A verbal and visual  cues.  SLP Short Term Goal 2 (Week 1): Pt will participate in an  objective swallow study to assess dysphagia SLP Short Term Goal 2 - Progress (Week 1): Met  General:  Date of Onset: 07/31/12 Type of Study: Modified Barium Swallowing Study Reason for Referral: Objectively evaluate swallowing function Previous  Swallow Assessment: BSE on 07/26/12 and recommended Dys.  3 textures and thin liquids Diet Prior to this Study: Dysphagia 2 (chopped);Thin liquids Temperature Spikes Noted: No Respiratory Status: Room air History of Recent Intubation: No Behavior/Cognition: Alert;Cooperative Oral Cavity - Dentition: Poor condition;Missing dentition Oral Motor / Sensory Function: Impaired - see Bedside swallow  eval Self-Feeding Abilities: Able to feed self Patient Positioning: Upright in chair Baseline Vocal Quality: Clear;Low vocal intensity Volitional Cough: Strong Volitional Swallow: Able to elicit Anatomy: Within functional limits Pharyngeal Secretions: Not observed secondary MBS  Reason for Referral:  Objectively evaluate swallowing function   Oral Phase Oral Preparation/Oral Phase Oral Phase: Impaired Oral - Thin Oral - Thin Cup: Delayed oral transit;Other (Comment) ( holds  bolus orally for 1-2 seconds prior to transit) Oral - Thin Straw: Delayed oral transit;Other (Comment) ( holds  bolus orally for 1-2 seconds prior to transit) Oral - Solids Oral - Puree: Delayed oral transit;Other (Comment) ( holds bolus  orally for 1-2 seconds prior to transit) Oral - Regular: Delayed oral transit;Reduced posterior  propulsion;Lingual pumping (mastication slow but functional, no  pocketing) Pharyngeal Phase  Pharyngeal Phase Pharyngeal Phase: Within functional limits (one episode of deep  flash penetration) Cervical Esophageal Phase  Cervical Esophageal Phase Cervical Esophageal Phase: [facility] Harlon Ditty, MA CCC-SLP [phone]  Claudine Mouton 07/31/2012, 12:42 PM       Recent Labs  07/31/12 0545  WBC 6.7  HGB 7.7*  HCT 23.2*  PLT 325    Recent Labs  07/31/12 0545  NA 141  K 3.5  CL 108  GLUCOSE 105*  BUN 23  CREATININE 2.23*  CALCIUM 8.7   CBG (last 3)   Recent Labs  08/01/12 0024 08/01/12 0447 08/01/12 0711  GLUCAP 84 103* 110*    Wt Readings from Last 3 Encounters:  07/23/12 68.6 kg (151 lb 3.8  oz)  07/23/12 68.6 kg (151 lb 3.8 oz)  07/04/12 63.3 kg (139 lb 8.8 oz)    Physical Exam:  HENT: oral mucosa generally moist. White coating over the tongue.  craniotomy site with no drainage. Staples out  Eyes: EOM are normal.  Neck: Neck supple. No thyromegaly present.  Cardiovascular: Regular rhythm. No Murmurs, gallops  Pulmonary/Chest: Effort normal and breath sounds normal. She has no wheezes.  Abdominal: Soft. Bowel sounds are normal. She exhibits no distension. NT Neurological: She   alert. Knows that she's in the hospital. She did follow simple commands. She did know where she was and why she is here. Decreased coordination with fine motor movements of the left arm, leg. Mild left facial droop. Left arm is grossly 4-/5. Left leg is 3+ to 4-. Sensation is 1/2 on the left, but she does sense gross pain. Speech is clearer. .   Assessment/Plan: 1. Functional deficits secondary to acute on chronic right frontal-parietal SDH which require 3+ hours per day of interdisciplinary therapy in a comprehensive inpatient rehab setting. Physiatrist is providing close team supervision and 24 hour management of active medical problems listed below. Physiatrist and rehab team continue to assess barriers to discharge/monitor patient progress toward functional and medical goals. FIM: FIM - Bathing Bathing Steps Patient Completed: Chest;Right Arm;Left Arm;Abdomen;Front  perineal area;Right upper leg;Left upper leg Bathing: 3: Mod-Patient completes 5-7 64f 10 parts or 50-74%  FIM - Upper Body Dressing/Undressing Upper body dressing/undressing steps patient completed: Thread/unthread right sleeve of pullover shirt/dresss;Thread/unthread left sleeve of pullover shirt/dress;Put head through opening of pull over shirt/dress;Pull shirt over trunk Upper body dressing/undressing: 5: Set-up assist to: Obtain clothing/put away FIM - Lower Body Dressing/Undressing Lower body dressing/undressing steps patient  completed: Thread/unthread left pants leg;Pull pants up/down;Fasten/unfasten pants;Thread/unthread right pants leg;Don/Doff right sock;Don/Doff left sock;Don/Doff right shoe;Don/Doff left shoe;Fasten/unfasten right shoe Lower body dressing/undressing: 4: Min-Patient completed 75 plus % of tasks  FIM - Toileting Toileting steps completed by patient: Adjust clothing prior to toileting Toileting Assistive Devices: Grab bar or rail for support Toileting: 3: Mod-Patient completed 2 of 3 steps  FIM - Archivist Transfers: 4-To toilet/BSC: Min A (steadying Pt. > 75%);4-From toilet/BSC: Min A (steadying Pt. > 75%)  FIM - Bed/Chair Transfer Bed/Chair Transfer: 4: Supine > Sit: Min A (steadying Pt. > 75%/lift 1 leg);4: Sit > Supine: Min A (steadying pt. > 75%/lift 1 leg);4: Bed > Chair or W/C: Min A (steadying Pt. > 75%);4: Chair or W/C > Bed: Min A (steadying Pt. > 75%)  FIM - Locomotion: Wheelchair Locomotion: Wheelchair: 2: Travels 50 - 149 ft with minimal assistance (Pt.>75%) FIM - Locomotion: Ambulation Locomotion: Ambulation Assistive Devices: Other (comment) (hand hold assist) Ambulation/Gait Assistance: 4: Min assist Locomotion: Ambulation: 2: Travels 50 - 149 ft with minimal assistance (Pt.>75%)  Comprehension Comprehension Mode: Auditory Comprehension: 4-Understands basic 75 - 89% of the time/requires cueing 10 - 24% of the time  Expression Expression Mode: Verbal Expression: 3-Expresses basic 50 - 74% of the time/requires cueing 25 - 50% of the time. Needs to repeat parts of sentences.  Social Interaction Social Interaction: 3-Interacts appropriately 50 - 74% of the time - May be physically or verbally inappropriate.  Problem Solving Problem Solving: 2-Solves basic 25 - 49% of the time - needs direction more than half the time to initiate, plan or complete simple activities  Memory Memory: 1-Recognizes or recalls less than 25% of the time/requires cueing greater than  75% of the time  Medical Problem List and Plan:  1. Acute on chronic right SDH. Status post right frontal parietal craniotomy 07/23/2012. Plan followup cranial CT scan 08/05/2012 for monitoring of any increase in size of right sided subdural collection. Monitor closely for a neurological changes  2. DVT Prophylaxis/Anticoagulation: SCDs. Monitor for any signs of DVT  3. Pain Management: Tylenol as needed  4. Neuropsych: This patient is not capable of making decisions on his/her own behalf.  5. Seizure prophylaxis. Keppra 500 mg twice a day. Monitor for any seizure activity  6. Diabetes mellitus peripheral neuropathy. Hemoglobin A1c of 12.3. Tradjenta 5 mg daily. Check blood sugars a.c. and at bedtime  -sugars remain under control at present. 7. Hypertension. Coreg 25 mg twice a day, clonidine patch change every 7 days and Avapro 300 mg, norvasc 10 daily.   -bp's still elevated.   -will add low dose HCTZ  -check BMET friday LOS (Days) 2 A FACE TO FACE EVALUATION WAS PERFORMED  SWARTZ,ZACHARY T 08/01/2012 7:53 AM

## 2012-08-01 NOTE — Progress Notes (Signed)
Subjective: Patient sitting up, eating breakfast. Without complaints.  Objective: Vital signs in last 24 hours: Filed Vitals:   07/31/12 0355 07/31/12 0555 07/31/12 1512 08/01/12 0504  BP: 177/63 178/69 144/97 189/62  Pulse: 83 78 75 82  Temp: 98.2 F (36.8 C) 98.1 F (36.7 C) 98.2 F (36.8 C) 98.2 F (36.8 C)  TempSrc: Oral Oral Oral Oral  Resp: 20 20 18 17   Height:      SpO2: 98% 98% 97% 97%    Intake/Output from previous day: 02/25 0701 - 02/26 0700 In: 360 [P.O.:360] Out: -  Intake/Output this shift: Total I/O In: -  Out: 2 [Urine:1; Stool:1]  Physical Exam:  Awake and alert, oriented to name and [facility] hospital, but not to year. Following commands. Moving all 4 extremities well. No drift of upper extremities.  CBC  Recent Labs  07/31/12 0545  WBC 6.7  HGB 7.7*  HCT 23.2*  PLT 325   BMET  Recent Labs  07/31/12 0545  NA 141  K 3.5  CL 108  CO2 22  GLUCOSE 105*  BUN 23  CREATININE 2.23*  CALCIUM 8.7     Assessment/Plan: Stable neurologically. Discussed case with Dr. Ranelle Oyster and Harvel Ricks, PA.  Patient is scheduled for CT brain without in 5 days, plan is to continue to monitor right hemispheric subdural collection.    [name], MD 08/01/2012, 8:33 AM

## 2012-08-01 NOTE — Progress Notes (Signed)
[facility]patient [facility] Individual Statement of Services  Patient Name:  April [name]  Date:  08/01/2012  Welcome to the [facility]patient [facility].  Our goal is to provide you with an individualized program based on your diagnosis and situation, designed to meet your specific needs.  With this comprehensive rehabilitation program, you will be expected to participate in at least 3 hours of rehabilitation therapies Monday-Friday, with modified therapy programming on the weekends.  Your rehabilitation program will include the following services:  Physical Therapy (PT), Occupational Therapy (OT), Speech Therapy (ST), 24 hour per day rehabilitation nursing, Therapeutic Recreaction (TR), Neuropsychology, Case Management ( Social Worker), Rehabilitation Medicine, Nutrition Services and Pharmacy Services  Weekly team conferences will be held on Tuesdays to discuss your progress.  Your  Social Worker will talk with you frequently to get your input and to update you on team discussions.  Team conferences with you and your family in attendance may also be held.  Expected length of stay: 1 week  Overall anticipated outcome: supervision  Depending on your progress and recovery, your program may change.  Your  Social Worker will coordinate services and will keep you informed of any changes.  Your  Social Worker's name and contact numbers are listed  below.  The following services may also be recommended but are not provided by the Inpatient [facility] Center:   Home Health Rehabiltiation Services  Outpatient Rehabilitatation Servives    Arrangements will be made to provide these services after discharge if needed.  Arrangements include referral to agencies that provide these services.  Your insurance has been verified to be:  Medicare, [employer] and [employer] primary doctor is:  (need to confirm)  Pertinent information will be shared with your doctor and your insurance  company.  Social Worker:  [place], [place] [phone] or [phone]  Information discussed with and copy given to patient by: Amada Jupiter, 08/01/2012, 3:22 PM

## 2012-08-01 NOTE — Progress Notes (Signed)
Hypoglycemic Event  CBG: 50  Treatment: 15 GM carbohydrate snack  Symptoms: None  Follow-up CBG: Time:0024 CBG Result:84  Possible Reasons for Event: Inadequate meal intake  Comments/MD notified: Vanita Panda D  Remember to initiate Hypoglycemia Order Set & complete

## 2012-08-01 NOTE — Progress Notes (Signed)
This note has been reviewed and this clinician agrees with information provided.  

## 2012-08-01 NOTE — Evaluation (Signed)
Speech Language Pathology Assessment and Plan  Patient Details  Name: April [name] MRN: [id number] Date of Birth: 04-17-1922  SLP Diagnosis: Dysarthria;Dysphagia;Cognitive Impairments  Rehab Potential: Good ELOS: 1 week   Today's Date: 08/01/2012 Time: 4098-1191 Time Calculation (min): 45 min  Skilled Therapeutic Intervention: Administered cognitive-linguistic evaluation. Please see below for details.   Problem List:  Patient Active Problem List  Diagnosis  . Encephalopathy acute  . Community acquired pneumonia  . Diabetes mellitus  . Hyperlipidemia  . Hypertension  . SDH (subdural hematoma)  . Lower extremity edema  . Anemia  . ARF (acute renal failure)  . HTN (hypertension)  . DVT (deep venous thrombosis)  . CKD (chronic kidney disease) stage 3, GFR 30-59 ml/min  . Subdural hematoma   Past Medical History:  Past Medical History  Diagnosis Date  . Diabetes mellitus   . Hypertension   . Hyperlipidemia   . Pneumonia   . Subdural hematoma   . Arthritis   . DVT (deep venous thrombosis)   . Incontinence   . Renal disorder     chronic kidney dz stage III   Past Surgical History:  Past Surgical History  Procedure Laterality Date  . No past surgeries    . Insertion of vena cava filter    . Craniotomy Right 07/23/2012    Procedure: CRANIOTOMY HEMATOMA EVACUATION SUBDURAL;  Surgeon: [name], MD;  Location: [facility];  Service: Neurosurgery;  Laterality: Right;    Assessment / Plan / Recommendation Clinical Impression  Pt presents with moderate-severe cognitive impairments characterized by impaired sustained attention, intellectual awareness, functional problem solving, safety awareness and working memory impacting pt's overall safety and independence with basic ADL tasks.  Pt would benefit from skilled SLP intervention to maximize cognitive function and overall functional independence for eventual discharge home. Anticipate pt will need 24 hour supervision  and f/u home health skilled SLP intervention.      SLP Assessment  Patient will need skilled Speech Lanaguage Pathology Services during CIR admission    Recommendations  Diet Recommendations: Dysphagia 1 (Puree);Thin liquid Liquid Administration via: Cup;Straw Medication Administration: Whole meds with liquid Supervision: Full supervision/cueing for compensatory strategies;Patient able to self feed Compensations: Slow rate;Small sips/bites Postural Changes and/or Swallow Maneuvers: Seated upright 90 degrees;Out of bed for meals Oral Care Recommendations: Oral care BID Patient destination: Home Follow up Recommendations: Home Health SLP;24 hour supervision/assistance Equipment Recommended: None recommended by SLP    SLP Frequency 5 out of 7 days   SLP Treatment/Interventions Cueing hierarchy;Cognitive remediation/compensation;Environmental controls;Internal/external aids;Speech/Language facilitation;Therapeutic Exercise;Therapeutic Activities;Patient/family education;Functional tasks;Dysphagia/aspiration precaution training    Pain Pain Assessment Pain Assessment: No/denies pain  Short Term Goals: Week 1: SLP Short Term Goal 1 (Week 1): Pt will utilize swallowing compensatory strategies to minimize overt s/s of aspiration with Mod A verbal and visual cues.  SLP Short Term Goal 2 (Week 1): Pt will participate in an objective swallow study to assess dysphagia SLP Short Term Goal 2 - Progress (Week 1): Discontinued (comment) SLP Short Term Goal 3 (Week 1): Pt will utilize visual and contextual cues to orient to place, time and situation with Min A.  SLP Short Term Goal 4 (Week 1): Pt will demonstrate sustained attention to task for 10 minutes with Mod A verbal cues for redirection  SLP Short Term Goal 5 (Week 1): pt will demonstrate functional problem solving for basic and familiar tasks with Mod A verbal and question cues.  SLP Short Term Goal 6 (Week 1): Pt will  utilize call bell to  request assistance with Mod A question and visual cues.   See FIM for current functional status Refer to Care Plan for Long Term Goals  Recommendations for other services: Neuropsych  Discharge Criteria: Patient will be discharged from SLP if patient refuses treatment 3 consecutive times without medical reason, if treatment goals not met, if there is a change in medical status, if patient makes no progress towards goals or if patient is discharged from hospital.  The above assessment, treatment plan, treatment alternatives and goals were discussed and mutually agreed upon: by patient  Ragina Fenter 08/01/2012, 3:52 PM

## 2012-08-01 NOTE — Progress Notes (Signed)
Social Work Patient ID: April [name], female   DOB: 10-21-1921, 79 y.o.   MRN: [id number]  Have reviewed team conference with pt and son.  Son aware of targeted d/c 3/4 with supervision goals.  Son notes that he and his daughter are able to provide this.  Anticipate arranging HH follow up to start.  Continue to follow.  Zenola Dezarn, LCSW

## 2012-08-01 NOTE — Progress Notes (Signed)
Physical Therapy Session Note  Patient Details  Name: April [name] MRN: [id number] Date of Birth: June 03, 1922  Today's Date: 08/01/2012 Time: 0904-1001 Time Calculation (min): 57 min  Short Term Goals: Week 1:  PT Short Term Goal 1 (Week 1): =long term goals  Skilled Therapeutic Interventions/Progress Updates:    This session focused on real-life situations in ADL apartment including bed mobility, furniture transfers, management of tasks in the kitchen.  All mobility required min assist for safety and to maintain balance while up on her feet.  WC mobility mod assist 50' mod assist to steer and increase propulsion power.  Car transfer min assist pt decided to do step in technique.  Min assist needed to steady pt for balance while stepping.  Pt needed max verbal cues for safety throughout session as well as verbal cues to find things on her left side (especially evident in the kitchen).  She continues to have difficulty staying awake while seated, but when engaged in a task that requires her to move she does well.    Therapy Documentation Precautions:  Precautions Precautions: Fall Precaution Comments: Decreased safety awareness, decreased awareness of deficits Restrictions Weight Bearing Restrictions: No    Locomotion : Ambulation Ambulation/Gait Assistance: 4: Min assist Wheelchair Mobility Distance: 50   See FIM for current functional status  Therapy/Group: Individual Therapy  [name], PT, DPT [phone]   08/01/2012, 12:08 PM

## 2012-08-01 NOTE — Progress Notes (Signed)
Social Work  Social Work Assessment and Plan  Patient Details  Name: April [name] MRN: [id number] Date of Birth: 03-02-1922  Today's Date: 08/01/2012  Problem List:  Patient Active Problem List  Diagnosis  . Encephalopathy acute  . Community acquired pneumonia  . Diabetes mellitus  . Hyperlipidemia  . Hypertension  . SDH (subdural hematoma)  . Lower extremity edema  . Anemia  . ARF (acute renal failure)  . HTN (hypertension)  . DVT (deep venous thrombosis)  . CKD (chronic kidney disease) stage 3, GFR 30-59 ml/min  . Subdural hematoma   Past Medical History:  Past Medical History  Diagnosis Date  . Diabetes mellitus   . Hypertension   . Hyperlipidemia   . Pneumonia   . Subdural hematoma   . Arthritis   . DVT (deep venous thrombosis)   . Incontinence   . Renal disorder     chronic kidney dz stage III   Past Surgical History:  Past Surgical History  Procedure Laterality Date  . No past surgeries    . Insertion of vena cava filter    . Craniotomy Right 07/23/2012    Procedure: CRANIOTOMY HEMATOMA EVACUATION SUBDURAL;  Surgeon: [name], MD;  Location: [facility];  Service: Neurosurgery;  Laterality: Right;   Social History:  reports that she has never smoked. She has never used smokeless tobacco. She reports that she does not drink alcohol or use illicit drugs.  Family / Support Systems Marital Status: Widow/Widower How Long?: 10 yrs Patient Roles: Parent Children: son, April [name] @ [phone] or (C) [phone] plus three other adult children all living "up [place]" ([place] and [place]):  daughter, April [name] @ (H) [phone] or [phone] Other Supports: son, April [name], has 3 adult daughters with one (21 yrs) living with them and attending college and another living in [place] - both provide support to pt as much as possible. Anticipated Caregiver: son Ability/Limitations of Caregiver: none Caregiver Availability: 24/7 Family Dynamics: Son reports that he  is very devoted to provided the support to pt that she needs and that this is why he brought her from [place] a few months ago to live with him.  Also notes that his daughter are also very involved and supportive.  Social History Preferred language: English Religion: Baptist Cultural Background: NA Education: HS Read: Yes Write: Yes Employment Status: Retired Fish farm manager Issues: none Guardian/Conservator: none   Abuse/Neglect Physical Abuse: Denies Verbal Abuse: Denies Sexual Abuse: Denies Exploitation of patient/patient's resources: Denies Self-Neglect: Denies  Emotional Status Pt's affect, behavior adn adjustment status: Pt can answer very basic personal demographic inforrmation.  Occasionally gets information incorrect but is beginning to self-correct.   No s/s of any significant emotional distress.  will monitor but does not appear that a formal depression screen is indicated. Recent Psychosocial Issues: Recently moved to [place] with son from her home with a grandson in [place]. Pyschiatric History: none Substance Abuse History: none  Patient / Family Perceptions, Expectations & Goals Pt/Family understanding of illness & functional limitations: son able to provide informed report of pt's acute/ subacute SDH as he has understood the information from the neurosurgeon.  Good understanding of current functional limitations and feels her cognition has continued to improve Premorbid pt/family roles/activities: Son notes that pt was independent with mobility and that she would often accompany him when he ran errands.  Rarley left alone at home. Anticipated changes in roles/activities/participation: very little change anticipated from what pt was requiring in assistance  PTA.  Son to resume caregiver role Pt/family expectations/goals: Son hopeful her coginition will return close to baseline function.  Community Resources [employer]: None Premorbid Home Care/DME Agencies:  None Transportation available at discharge: yes  Discharge Planning Living Arrangements: Children;Other relatives Support Systems: Children;Other relatives Type of Residence: Private residence Insurance Resources: Administrator (specify) Financial Resources: Restaurant manager, fast food Screen Referred: No Living Expenses: Lives with family Money Management: Family Do you have any problems obtaining your medications?: No Home Management: son and Counselling psychologist Preliminary Plans: Pt expects to return home with son and grandtr who will coordinate 24/7 assistance Social Work Anticipated Follow Up Needs: HH/OP Expected length of stay: ELOS 7 days  Clinical Impression Pleasant, elderly woman here after acute on chronic SDH with very supportive son at bedside.  Anticipating good progress with supervision goals which son notes he and his daughter can provide.  No significant emotional distress noted, however, will monitor throughout stay.  April [name] 08/01/2012, 3:47 PM

## 2012-08-01 NOTE — Progress Notes (Signed)
Physical Therapy Note  Patient Details  Name: April [name] MRN: [id number] Date of Birth: 07/13/1921 Today's Date: 08/01/2012  1400-1430 ( 30 minutes) individual Pain: no reported pain Focus of treatment: gait training Treatment: Pt in bed upon arrival; supine to sit with bed rail min to SBA; transfer stand/turn min assist ; gait 100 feet X 3 HHA on left or at waist to challenge balance; up/down 4 steps with bilateral rails min assist.    [place] Nonaka,JIM 08/01/2012, 2:18 PM

## 2012-08-01 NOTE — Progress Notes (Signed)
Inpatient Diabetes Program Recommendations  AACE/ADA: New Consensus Statement on Inpatient Glycemic Control (2013)  Target Ranges:  Prepandial:   less than 140 mg/dL      Peak postprandial:   less than 180 mg/dL (1-2 hours)      Critically ill patients:  140 - 180 mg/dL  Results for April, [name] (MRN [id number]) as of 08/01/2012 14:39  Ref. Range 07/31/2012 20:18 07/31/2012 23:44 08/01/2012 00:24 08/01/2012 04:47 08/01/2012 07:11 08/01/2012 11:37  Glucose-Capillary Latest Range: 70-99 mg/dL 657 (H) 50 (L) 84 846 (H) 110 (H) 235 (H)    Inpatient Diabetes Program Recommendations Correction (SSI): decrease to SENSITIVE scale TID if eating  Thank you  Piedad Climes BSN, RN,CDE Inpatient Diabetes Coordinator [phone] (team pager)

## 2012-08-02 ENCOUNTER — Encounter (HOSPITAL_COMMUNITY): Payer: Medicare Other | Admitting: Occupational Therapy

## 2012-08-02 ENCOUNTER — Inpatient Hospital Stay (HOSPITAL_COMMUNITY): Payer: Medicare Other | Admitting: Physical Therapy

## 2012-08-02 ENCOUNTER — Inpatient Hospital Stay (HOSPITAL_COMMUNITY): Payer: Medicare Other | Admitting: *Deleted

## 2012-08-02 ENCOUNTER — Inpatient Hospital Stay (HOSPITAL_COMMUNITY): Payer: Medicare Other | Admitting: Speech Pathology

## 2012-08-02 LAB — GLUCOSE, CAPILLARY
Glucose-Capillary: 137 mg/dL — ABNORMAL HIGH (ref 70–99)
Glucose-Capillary: 160 mg/dL — ABNORMAL HIGH (ref 70–99)
Glucose-Capillary: 156 mg/dL — ABNORMAL HIGH (ref 70–99)

## 2012-08-02 MED ORDER — HYDROCHLOROTHIAZIDE 12.5 MG PO CAPS
12.5000 mg | ORAL_CAPSULE | Freq: Every day | ORAL | Status: DC
  Administered 2012-08-02 – 2012-08-06 (×5): 12.5 mg via ORAL
  Filled 2012-08-02 (×6): qty 1

## 2012-08-02 NOTE — Progress Notes (Signed)
Physical Therapy Session Note  Patient Details  Name: April [name] MRN: [id number] Date of Birth: 1922/05/22  Today's Date: 08/02/2012 Time: 1400-1455 Time Calculation (min): 55 min  Short Term Goals: Week 1:  PT Short Term Goal 1 (Week 1): =long term goals  Skilled Therapeutic Interventions/Progress Updates:    Pt coughing more this session, RN made aware and states she has noticed increased coughing today as well. Speech therapist made aware.  Session focused on safety and navigation with RW (in crowded room and controlled environments), increased endurance, strength, and attention to Lt. Field. Pt performed standing balance activities with reaching to Lt. Side and anteriorly to promote balance reactions, pt had to remember a sequence of four colors to promote improved memory and to challenge pt. Min assist for balance, mod assist for short term memory. Lettered ball toss in standing, slightly narrowed base of support + cognitive challenge. Pt requires max verbal cues to perform cognitive task appropriately and remember what the task was. Strengthening in the room standing at the sink: squats, hip abduction, hip extension with bil. LEs 2 x 10 reps each.   Attempted to work with pt on management of wheelchair, pt had difficulty with any carryover secondary to poor memory. Pt does relatively well with RW, will need supervision at all times however she will need 24/7 supervision without a device regardless. Recommend use of RW at this time.  Therapy Documentation Precautions:  Precautions Precautions: Fall Precaution Comments: Decreased safety awareness, decreased awareness of deficits Restrictions Weight Bearing Restrictions: No Pain: Pain Assessment Pain Assessment: No/denies pain Pain Score:  (not rated) Pain Type: Chronic pain Pain Location: Back Pain Descriptors: Aching Pain Onset: On-going Pain Intervention(s): Repositioned  See FIM for current functional  status  Therapy/Group: Individual Therapy  April [name] 08/02/2012, 3:19 PM

## 2012-08-02 NOTE — Progress Notes (Signed)
Occupational Therapy Session Note  Patient Details  Name: April [name] MRN: [id number] Date of Birth: 1921/11/11  Today's Date: 08/02/2012 Time: 0930-1030 60 minutes  Short Term Goals: Week 1:  OT Short Term Goal 1 (Week 1): STG=LTG; overall supervision  Skilled Therapeutic Interventions/Progress Updates:  Pt. At nurses station upon arrival. Pt. Participated in 1:1 session in ADl apartment for basic self care task with a focus on transfers, sit to stands, standing stability, attention, orientation, and organizing and sequencing of thoughts for task. Pt. Needed questioning cues to orient to time, place, and situation.  Completed Sit to stands  and transfers at min steady assist and mod VC for anterior weight shift prior to standing and proper hand/foot placement.  On occasions, pt. demonstrated unsafe and impulsive behaviors with attempts to sit without completing turns. Performed bathing and dressing with minimal questioning cues to attend to all parts of body (pesevartive with washing abdominal area) and steady assist for LB task.  Functionally ambulated  with HHA to obtain deodarant and completed grooming standing at sink level with steady assist for balance. Pt.  With improve attention today, talkative but repetative in comments. Pt. In chair at nurses station with quick release in place.   Therapy Documentation Precautions:  Precautions Precautions: Fall Precaution Comments: Decreased safety awareness, decreased awareness of deficits Restrictions Weight Bearing Restrictions: No Vital Signs: Therapy Vitals BP: 178/70 mmHg Pain:   See FIM for current functional status  Therapy/Group: Individual Therapy  Loye Vento OTA/S Occupational Therapy Assistant Student 08/02/2012, 10:39 AM

## 2012-08-02 NOTE — Progress Notes (Signed)
Physical Therapy Note  Patient Details  Name: April [name] MRN: [id number] Date of Birth: 11-21-1921 Today's Date: 08/02/2012  Time: 1130-1200 30 minutes  No c/o pain.  Treatment focused on gait training with and without RW.  Gait without AD with min A HHA with cues for wt shifts and 2 x min-mod A to correct LOB.  Pt able to gait 75', 50' without AD.  With RW pt able to gait with close supervision-min A, no LOB.  Obstacle negotiation with cuing, side and backward stepping with RW with max verbal and visual cues and min A for RW control.  Pt able to increased gait distance to 125' with RW before fatigued.  Individual therapy   Erika Hussar 08/02/2012, 11:57 AM

## 2012-08-02 NOTE — Progress Notes (Signed)
This note has been reviewed and this clinician agrees with information provided.  

## 2012-08-02 NOTE — Progress Notes (Signed)
Subjective/Complaints: No new issues. No wheezing this am. A 12 point review of systems has been performed and if not noted above is otherwise negative.   Objective: Vital Signs: Blood pressure 181/71, pulse 77, temperature 97.9 F (36.6 C), temperature source Oral, resp. rate 17, height 5\' 2"  (1.575 m), weight 70.3 kg (154 lb 15.7 oz), SpO2 96.00%. Dg Swallowing Func-speech Pathology  07/31/2012  Riley Nearing Deblois, CCC-SLP     07/31/2012  2:14 PM Objective Swallowing Evaluation: Modified Barium Swallowing Study   Patient Details  Name: April [name] MRN: [id number] Date of Birth: Jul 27, 1921  Today's Date: 07/31/2012 Time: 1145- 1207 Time Calculation (min): 22 min  Past Medical History:  Past Medical History  Diagnosis Date  . Diabetes mellitus   . Hypertension   . Hyperlipidemia   . Pneumonia   . Subdural hematoma   . Arthritis   . DVT (deep venous thrombosis)   . Incontinence   . Renal disorder     chronic kidney dz stage III   Past Surgical History:  Past Surgical History  Procedure Laterality Date  . No past surgeries    . Insertion of vena cava filter    . Craniotomy Right 07/23/2012    Procedure: CRANIOTOMY HEMATOMA EVACUATION SUBDURAL;  Surgeon:  [name], MD;  Location: [facility];  Service:  Neurosurgery;  Laterality: Right;   HPI:        Recommendation/Prognosis  Clinical Impression Dysphagia Diagnosis: Mild oral phase dysphagia Clinical impression: Pt demonstrates a mild oral dysphagia with  slow mastication and pumping behavior of tongue to transit solid  bolus. Pt orally holds liquid boluses briefly prior to transit to  pharynx; base of tongue contact to soft palate adequate to  contain bolus and prevent spillage or delay in swallow  initiation. Pharyngeal phase WFL (one episode of deep flash  penetration).  There were no instances of cough or throat  clearing duing exam.  Recommend pt consume a dys 3 (mechanical  soft) diet with thin liquids with skilled SLP f/u to determine if   texture is appropriate during meal times. Basic aspiraiton  precautions.  Swallow Evaluation Recommendations Recommended Consults: MBS Diet Recommendations: Dysphagia 3 (Mechanical Soft);Thin liquid Liquid Administration via: Cup;Straw Medication Administration: Whole meds with liquid Supervision: Patient able to self feed;Intermittent supervision  to cue for compensatory strategies Compensations: Slow rate;Small sips/bites Postural Changes and/or Swallow Maneuvers: Seated upright 90  degrees;Out of bed for meals Oral Care Recommendations: Oral care BID Follow up Recommendations: Inpatient Rehab Prognosis Prognosis for Safe Diet Advancement: Good Individuals Consulted Consulted and Agree with Results and Recommendations: Patient  SLP Assessment/Plan Dysphagia Diagnosis: Mild oral phase dysphagia Clinical impression: Pt demonstrates a mild oral dysphagia with  slow mastication and pumping behavior of tongue to transit solid  bolus. Pt orally holds liquid boluses briefly prior to transit to  pharynx; base of tongue contact to soft palate adequate to  contain bolus and revent spillage or delay in swallow initiation.  Pharyngeal phase WFL (one episode of deep flash penetration).   There were no instances of cough or throat clearing duing exam.   Recommend pt consume a dys 3 (mechanical soft) diet with thin  liquids with skilled SLP f/u to determine if texture is  appropriate during meal times. Basic aspiraiton precautions.   Short Term Goals: Week 1: SLP Short Term Goal 1 (Week 1): Pt will utilize  swallowing compensatory strategies to minimize overt s/s of  aspiration with Mod A verbal and visual  cues.  SLP Short Term Goal 2 (Week 1): Pt will participate in an  objective swallow study to assess dysphagia SLP Short Term Goal 2 - Progress (Week 1): Met  General:  Date of Onset: 07/31/12 Type of Study: Modified Barium Swallowing Study Reason for Referral: Objectively evaluate swallowing function Previous Swallow  Assessment: BSE on 07/26/12 and recommended Dys.  3 textures and thin liquids Diet Prior to this Study: Dysphagia 2 (chopped);Thin liquids Temperature Spikes Noted: No Respiratory Status: Room air History of Recent Intubation: No Behavior/Cognition: Alert;Cooperative Oral Cavity - Dentition: Poor condition;Missing dentition Oral Motor / Sensory Function: Impaired - see Bedside swallow  eval Self-Feeding Abilities: Able to feed self Patient Positioning: Upright in chair Baseline Vocal Quality: Clear;Low vocal intensity Volitional Cough: Strong Volitional Swallow: Able to elicit Anatomy: Within functional limits Pharyngeal Secretions: Not observed secondary MBS  Reason for Referral:  Objectively evaluate swallowing function   Oral Phase Oral Preparation/Oral Phase Oral Phase: Impaired Oral - Thin Oral - Thin Cup: Delayed oral transit;Other (Comment) ( holds  bolus orally for 1-2 seconds prior to transit) Oral - Thin Straw: Delayed oral transit;Other (Comment) ( holds  bolus orally for 1-2 seconds prior to transit) Oral - Solids Oral - Puree: Delayed oral transit;Other (Comment) ( holds bolus  orally for 1-2 seconds prior to transit) Oral - Regular: Delayed oral transit;Reduced posterior  propulsion;Lingual pumping (mastication slow but functional, no  pocketing) Pharyngeal Phase  Pharyngeal Phase Pharyngeal Phase: Within functional limits (one episode of deep  flash penetration) Cervical Esophageal Phase  Cervical Esophageal Phase Cervical Esophageal Phase: [facility] Harlon Ditty, MA CCC-SLP [phone]  Claudine Mouton 07/31/2012, 12:42 PM       Recent Labs  07/31/12 0545  WBC 6.7  HGB 7.7*  HCT 23.2*  PLT 325    Recent Labs  07/31/12 0545  NA 141  K 3.5  CL 108  GLUCOSE 105*  BUN 23  CREATININE 2.23*  CALCIUM 8.7   CBG (last 3)   Recent Labs  08/01/12 1628 08/01/12 2001 08/02/12 0609  GLUCAP 115* 219* 137*    Wt Readings from Last 3 Encounters:  08/01/12 70.3 kg (154 lb 15.7 oz)   07/23/12 68.6 kg (151 lb 3.8 oz)  07/23/12 68.6 kg (151 lb 3.8 oz)    Physical Exam:  HENT: oral mucosa generally moist. White coating over the tongue.  craniotomy site with no drainage. Staples out  Eyes: EOM are normal.  Neck: Neck supple. No thyromegaly present.  Cardiovascular: Regular rhythm. No Murmurs, gallops  Pulmonary/Chest: Effort normal and breath sounds normal. She has no wheezes.  Abdominal: Soft. Bowel sounds are normal. She exhibits no distension. NT Neurological: She   alert. Knows that she's in the hospital. She did follow simple commands. She did know where she was and why she is here. Decreased coordination with fine motor movements of the left arm, leg. Mild left facial droop. Left arm is grossly 4-/5. Left leg is 3+ to 4-. Sensation is 1/2 on the left, but she does sense gross pain. Speech is clearer. .   Assessment/Plan: 1. Functional deficits secondary to acute on chronic right frontal-parietal SDH which require 3+ hours per day of interdisciplinary therapy in a comprehensive inpatient rehab setting. Physiatrist is providing close team supervision and 24 hour management of active medical problems listed below. Physiatrist and rehab team continue to assess barriers to discharge/monitor patient progress toward functional and medical goals. FIM: FIM - Bathing Bathing Steps Patient Completed: Chest;Right Arm;Left Arm;Abdomen;Front  perineal area;Buttocks;Right upper leg;Left upper leg;Right lower leg (including foot);Left lower leg (including foot) Bathing: 4: Steadying assist  FIM - Upper Body Dressing/Undressing Upper body dressing/undressing steps patient completed: Thread/unthread right sleeve of pullover shirt/dresss;Thread/unthread left sleeve of pullover shirt/dress;Put head through opening of pull over shirt/dress;Pull shirt over trunk Upper body dressing/undressing: 5: Set-up assist to: Obtain clothing/put away FIM - Lower Body Dressing/Undressing Lower body  dressing/undressing steps patient completed: Thread/unthread right underwear leg;Thread/unthread left underwear leg;Pull underwear up/down;Thread/unthread right pants leg;Thread/unthread left pants leg;Pull pants up/down;Don/Doff left shoe;Don/Doff right shoe;Fasten/unfasten left shoe;Fasten/unfasten right shoe Lower body dressing/undressing: 4: Steadying Assist  FIM - Toileting Toileting steps completed by patient: Adjust clothing prior to toileting Toileting Assistive Devices: Grab bar or rail for support Toileting: 3: Mod-Patient completed 2 of 3 steps  FIM - Archivist Transfers: 4-To toilet/BSC: Min A (steadying Pt. > 75%);4-From toilet/BSC: Min A (steadying Pt. > 75%)  FIM - Bed/Chair Transfer Bed/Chair Transfer Assistive Devices: Arm rests Bed/Chair Transfer: 4: Sit > Supine: Min A (steadying pt. > 75%/lift 1 leg);4: Supine > Sit: Min A (steadying Pt. > 75%/lift 1 leg);4: Bed > Chair or W/C: Min A (steadying Pt. > 75%);4: Chair or W/C > Bed: Min A (steadying Pt. > 75%)  FIM - Locomotion: Wheelchair Distance: 50 Locomotion: Wheelchair: 2: Travels 50 - 149 ft with moderate assistance (Pt: 50 - 74%) FIM - Locomotion: Ambulation Locomotion: Ambulation Assistive Devices: Other (comment) (one hand held assist) Ambulation/Gait Assistance: 4: Min assist Locomotion: Ambulation: 2: Travels 50 - 149 ft with minimal assistance (Pt.>75%)  Comprehension Comprehension Mode: Auditory Comprehension: 3-Understands basic 50 - 74% of the time/requires cueing 25 - 50%  of the time  Expression Expression Mode: Verbal Expression: 3-Expresses basic 50 - 74% of the time/requires cueing 25 - 50% of the time. Needs to repeat parts of sentences.  Social Interaction Social Interaction: 3-Interacts appropriately 50 - 74% of the time - May be physically or verbally inappropriate.  Problem Solving Problem Solving: 2-Solves basic 25 - 49% of the time - needs direction more than half the time to  initiate, plan or complete simple activities  Memory Memory: 2-Recognizes or recalls 25 - 49% of the time/requires cueing 51 - 75% of the time  Medical Problem List and Plan:  1. Acute on chronic right SDH. Status post right frontal parietal craniotomy 07/23/2012. Plan followup cranial CT scan 08/05/2012 for monitoring of any increase in size of right sided subdural collection. Stable neurologically at present.  -follow up CT next week. 2. DVT Prophylaxis/Anticoagulation: SCDs. Monitor for any signs of DVT  3. Pain Management: Tylenol as needed  4. Neuropsych: This patient is not capable of making decisions on his/her own behalf.  5. Seizure prophylaxis. Keppra 500 mg twice a day. Monitor for any seizure activity  6. Diabetes mellitus peripheral neuropathy. Hemoglobin A1c of 12.3. Tradjenta 5 mg daily. Check blood sugars a.c. and at bedtime  -sugars remain under control at present. 7. Hypertension. Coreg 25 mg twice a day, clonidine patch change every 7 days and Avapro 300 mg, norvasc 10 daily.   -bp's still elevated.   -added low dose HCTZ yesterday.   -recheck BMET Friday given CRI.  8. ?mild reactive airway disease/wheezing. Added combivent MDI prn yesterday.   -no signs of aspiration.  LOS (Days) 3 A FACE TO FACE EVALUATION WAS PERFORMED  SWARTZ,ZACHARY T 08/02/2012 7:19 AM

## 2012-08-02 NOTE — Progress Notes (Signed)
Speech Language Pathology Daily Session Note  Patient Details  Name: April [name] MRN: [id number] Date of Birth: [birth year]  Today's Date: 08/02/2012 Time: 5366-4403 Time Calculation (min): 40 min  Short Term Goals: Week 1: SLP Short Term Goal 1 (Week 1): Pt will utilize swallowing compensatory strategies to minimize overt s/s of aspiration with Mod A verbal and visual cues.  SLP Short Term Goal 2 (Week 1): Pt will participate in an objective swallow study to assess dysphagia SLP Short Term Goal 2 - Progress (Week 1): Discontinued (comment) SLP Short Term Goal 3 (Week 1): Pt will utilize visual and contextual cues to orient to place, time and situation with Min A.  SLP Short Term Goal 4 (Week 1): Pt will demonstrate sustained attention to task for 10 minutes with Mod A verbal cues for redirection  SLP Short Term Goal 5 (Week 1): pt will demonstrate functional problem solving for basic and familiar tasks with Mod A verbal and question cues.  SLP Short Term Goal 6 (Week 1): Pt will utilize call bell to request assistance with Mod A question and visual cues.   Skilled Therapeutic Interventions: Treatment focus on cognitive goals. Pt was asleep in her wheelchair at beginning of session and required verbal and tactile cues for arousal.  Pt required Max A question, contextual and visual cues to orient to time, place and situation.  Pt utilized compensatory strategy of external memory aids to increase recall of functional information with Mod question cues. Pt required Max A verbal and tactile cues to stay awake and sustain attention to task for ~2 minutes.  Pt's son present during session and reported the pt naps from 11-3 at home which is impacting her arousal during treatment sessions.    FIM:  Comprehension Comprehension Mode: Auditory Comprehension: 3-Understands basic 50 - 74% of the time/requires cueing 25 - 50%  of the time Expression Expression Mode: Verbal Expression: 3-Expresses basic  50 - 74% of the time/requires cueing 25 - 50% of the time. Needs to repeat parts of sentences. Social Interaction Social Interaction: 2-Interacts appropriately 25 - 49% of time - Needs frequent redirection. Problem Solving Problem Solving: 2-Solves basic 25 - 49% of the time - needs direction more than half the time to initiate, plan or complete simple activities Memory Memory: 2-Recognizes or recalls 25 - 49% of the time/requires cueing 51 - 75% of the time FIM - Eating Eating Activity: 5: Supervision/cues;5: Set-up assist for open containers;5: Set-up assist for cut food  Pain Pain Assessment Pain Assessment: No/denies pain  Therapy/Group: Individual Therapy  Simmie Garin 08/02/2012, 2:34 PM

## 2012-08-03 ENCOUNTER — Inpatient Hospital Stay (HOSPITAL_COMMUNITY): Payer: Medicare Other | Admitting: Speech Pathology

## 2012-08-03 ENCOUNTER — Inpatient Hospital Stay (HOSPITAL_COMMUNITY): Payer: Medicare Other | Admitting: *Deleted

## 2012-08-03 ENCOUNTER — Inpatient Hospital Stay (HOSPITAL_COMMUNITY): Payer: Medicare Other | Admitting: Physical Therapy

## 2012-08-03 ENCOUNTER — Encounter (HOSPITAL_COMMUNITY): Payer: Medicare Other | Admitting: Occupational Therapy

## 2012-08-03 DIAGNOSIS — S065X9A Traumatic subdural hemorrhage with loss of consciousness of unspecified duration, initial encounter: Secondary | ICD-10-CM

## 2012-08-03 DIAGNOSIS — I10 Essential (primary) hypertension: Secondary | ICD-10-CM

## 2012-08-03 DIAGNOSIS — E1165 Type 2 diabetes mellitus with hyperglycemia: Secondary | ICD-10-CM

## 2012-08-03 LAB — BASIC METABOLIC PANEL
BUN: 19 mg/dL (ref 6–23)
Calcium: 8.6 mg/dL (ref 8.4–10.5)
Creatinine, Ser: 2.19 mg/dL — ABNORMAL HIGH (ref 0.50–1.10)
GFR calc Af Amer: 22 mL/min — ABNORMAL LOW (ref >90)
GFR calc non Af Amer: 19 mL/min — ABNORMAL LOW (ref >90)

## 2012-08-03 LAB — GLUCOSE, CAPILLARY
Glucose-Capillary: 179 mg/dL — ABNORMAL HIGH (ref 70–99)
Glucose-Capillary: 132 mg/dL — ABNORMAL HIGH (ref 70–99)

## 2012-08-03 NOTE — Progress Notes (Signed)
This note has been reviewed and this clinician agrees with information provided.  

## 2012-08-03 NOTE — Progress Notes (Signed)
Patient ID: April [name], female   DOB: [birth year], 79 y.o.   MRN: [id number] Subjective/Complaints: 79 y.o. right-handed female with history of diabetes mellitus and peripheral neuropathy, hypertension and chronic renal insufficiency with baseline 1.80-2.10 as well his recent DVT with IVC filter placed. Admitted 07/23/2012 with noted history of large right hemispheric chronic subdural hematoma. She had been seen by neurosurgery in the past and surgeries not felt to be indicated. She now presents with increased left-sided weakness over the past 2 days and followup CT scan again shows chronic subdural hematoma with significant mass effect and midline shift. Patient underwent right frontoparietal craniotomy evacuation of subdural hematoma 07/23/2012 per Dr. Newell Coral. Placed on Keppra for seizure prophylaxis Showered in the independent living apartment today. Fastening large buttons on her sweater No new issues. No wheezing this am. A 12 point review of systems has been performed and if not noted above is otherwise negative.   Objective: Vital Signs: Blood pressure 167/75, pulse 79, temperature 98.4 F (36.9 C), temperature source Oral, resp. rate 18, height 5\' 2"  (1.575 m), weight 70.3 kg (154 lb 15.7 oz), SpO2 99.00%. No results found. No results found for this basename: WBC, HGB, HCT, PLT,  in the last 72 hours No results found for this basename: NA, K, CL, CO, GLUCOSE, BUN, CREATININE, CALCIUM,  in the last 72 hours CBG (last 3)   Recent Labs  08/02/12 1720 08/02/12 2053 08/03/12 0724  GLUCAP 156* 218* 106*    Wt Readings from Last 3 Encounters:  08/01/12 70.3 kg (154 lb 15.7 oz)  07/23/12 68.6 kg (151 lb 3.8 oz)  07/23/12 68.6 kg (151 lb 3.8 oz)    Physical Exam:  HENT: oral mucosa generally moist. White coating over the tongue.  craniotomy site with no drainage. Staples out  Eyes: EOM are normal.  Neck: Neck supple. No thyromegaly present.  Cardiovascular: Regular rhythm. No  Murmurs, gallops  Pulmonary/Chest: Effort normal and breath sounds normal. She has no wheezes.  Abdominal: Soft. Bowel sounds are normal. She exhibits no distension. NT Neurological: She   alert. Knows that she's in the hospital. She did follow simple commands. She did know where she was and why she is here. Decreased coordination with fine motor movements of the left arm, leg. Mild left facial droop. Left arm is grossly 4-/5. Left leg is 3+ to 4-. Sensation is 1/2 on the left, but she does sense gross pain. Speech is Mildly dysarthric .   Assessment/Plan: 1. Functional deficits secondary to acute on chronic right frontal-parietal SDH which require 3+ hours per day of interdisciplinary therapy in a comprehensive inpatient rehab setting. Physiatrist is providing close team supervision and 24 hour management of active medical problems listed below. Physiatrist and rehab team continue to assess barriers to discharge/monitor patient progress toward functional and medical goals. FIM: FIM - Bathing Bathing Steps Patient Completed: Chest;Right Arm;Left Arm;Abdomen;Front perineal area;Right upper leg;Left upper leg;Buttocks Bathing: 4: Min-Patient completes 8-9 52f 10 parts or 75+ percent  FIM - Upper Body Dressing/Undressing Upper body dressing/undressing steps patient completed: Thread/unthread right sleeve of pullover shirt/dresss;Thread/unthread left sleeve of pullover shirt/dress;Put head through opening of pull over shirt/dress;Pull shirt over trunk Upper body dressing/undressing: 5: Set-up assist to: Obtain clothing/put away FIM - Lower Body Dressing/Undressing Lower body dressing/undressing steps patient completed: Thread/unthread right underwear leg;Thread/unthread left underwear leg;Pull underwear up/down;Thread/unthread right pants leg;Pull pants up/down;Don/Doff right shoe;Don/Doff left shoe;Fasten/unfasten right shoe;Fasten/unfasten left shoe Lower body dressing/undressing: 4: Min-Patient  completed 75 plus % of tasks  FIM - Toileting Toileting steps completed by patient: Adjust clothing prior to toileting;Performs perineal hygiene Toileting Assistive Devices: Grab bar or rail for support Toileting: 3: Mod-Patient completed 2 of 3 steps  FIM - Archivist Transfers: 4-To toilet/BSC: Min A (steadying Pt. > 75%);4-From toilet/BSC: Min A (steadying Pt. > 75%)  FIM - Bed/Chair Transfer Bed/Chair Transfer Assistive Devices: Therapist, occupational: 4: Bed > Chair or W/C: Min A (steadying Pt. > 75%);4: Chair or W/C > Bed: Min A (steadying Pt. > 75%)  FIM - Locomotion: Wheelchair Distance: 50 Locomotion: Wheelchair: 2: Travels 50 - 149 ft with moderate assistance (Pt: 50 - 74%) FIM - Locomotion: Ambulation Locomotion: Ambulation Assistive Devices: Designer, industrial/product Ambulation/Gait Assistance: 4: Min assist Locomotion: Ambulation: 2: Travels 50 - 149 ft with minimal assistance (Pt.>75%)  Comprehension Comprehension Mode: Auditory Comprehension: 3-Understands basic 50 - 74% of the time/requires cueing 25 - 50%  of the time  Expression Expression Mode: Verbal Expression: 3-Expresses basic 50 - 74% of the time/requires cueing 25 - 50% of the time. Needs to repeat parts of sentences.  Social Interaction Social Interaction: 2-Interacts appropriately 25 - 49% of time - Needs frequent redirection.  Problem Solving Problem Solving: 2-Solves basic 25 - 49% of the time - needs direction more than half the time to initiate, plan or complete simple activities  Memory Memory: 2-Recognizes or recalls 25 - 49% of the time/requires cueing 51 - 75% of the time  Medical Problem List and Plan:  1. Acute on chronic right SDH. Status post right frontal parietal craniotomy 07/23/2012. Plan followup cranial CT scan 08/05/2012 for monitoring of any increase in size of right sided subdural collection. Stable neurologically at present.  -follow up CT next week. 2. DVT  Prophylaxis/Anticoagulation: SCDs. Monitor for any signs of DVT  3. Pain Management: Tylenol as needed  4. Neuropsych: This patient is not capable of making decisions on his/her own behalf.  5. Seizure prophylaxis. Keppra 500 mg twice a day. Monitor for any seizure activity  6. Diabetes mellitus peripheral neuropathy. Hemoglobin A1c of 12.3. Tradjenta 5 mg daily. Check blood sugars a.c. and at bedtime  -sugars remain under control at present. 7. Hypertension. Coreg 25 mg twice a day, clonidine patch change every 7 days and Avapro 300 mg, norvasc 10 daily.   -bp's still elevated.   -added low dose HCTZ yesterday.   -recheck BMET Friday given CRI.  8. ?mild reactive airway disease/wheezing. Added combivent MDI prn yesterday.   -no signs of aspiration.  LOS (Days) 4 A FACE TO FACE EVALUATION WAS PERFORMED  Berkeley Vanaken E 08/03/2012 8:04 AM

## 2012-08-03 NOTE — Progress Notes (Signed)
Occupational Therapy Note  Patient Details  Name: April [name] MRN: [id number] Date of Birth: 1921-08-17 Today's Date: 08/03/2012 Time:  1100-1130  (30 min) Individual session Pain::  None  Engaged in therapeutic activities with focus on balance, BUE AROM  With emphasis on  LUE moving in all planes.  Pt. Transferred to mat with minimal assist.  Performed AROM and isometric with BUE.  Pt. Followed 1 step directions with physical and verbal cues.  Performed standing balance exercises with turning, side stepping, and forward/backward with minimal assist.  Transferred back to wc and placed pt in room with IV to draw labs.  Asked IV to take pt to nursing station when done.     Humberto Seals 08/03/2012, 12:14 PM

## 2012-08-03 NOTE — Progress Notes (Signed)
Physical Therapy Note  Patient Details  Name: April [name] MRN: [id number] Date of Birth: [birth year] Today's Date: 08/03/2012  Time: 1400-1500 60 minutes  No c/o pain.  Pt in bed upon PT arrival, aroused easily once in upright seated position (total A supine to sit).  Gait training with RW focusing on obstacle negotiation with min cuing.  Pt able to gait on carpet and tile surfaces with close supervision.  Stair negotiation with B handrails 2 x 5 stairs with min A.  Controlled environment gait pt able to increase distance to 150' before fatigued.  Standing cognitive task with min A for standing, pt required min cuing to make a grocery list of items to prepare her favorite meal.  Individual therapy   Annjanette Wertenberger 08/03/2012, 2:56 PM

## 2012-08-03 NOTE — Progress Notes (Signed)
Occupational Therapy Session Note  Patient Details  Name: April [name] MRN: [id number] Date of Birth: Mar 04, 1922  Today's Date: 08/03/2012 Time: 0930-1030 Time Calculation (min): 60 min  Short Term Goals: Week 1:  OT Short Term Goal 1 (Week 1): STG=LTG; overall supervision  Skilled Therapeutic Interventions/Progress Updates:  Pt. at nurses station in recliner upon arrival. Functionally ambulated with steady A to supervision using RW  to ADL apartment for basic self care retraining at tub/shower level. Focused on transfers, sit to stands, orientation, and short term memory recall. Completed transfer with Min instructional cues and steady assist to guide turns and RW for safe stand to sits on tub transfer bench.  Pt. Performed self care task with supervision to min A, requiring steady assist for sit to stands. Within session, pt with multiple attempts to stand secondary to decreased anterior weight shift, needing tactile cues to facilitate technigue. In room completed grooming standing at sink ( for standing balance/tolerance, steady assist) with min A to recall reason for standing at sink and to initiate task. Left with nurse in room and quick release in place.   Therapy Documentation Precautions:  Precautions Precautions: Fall Precaution Comments: Decreased safety awareness, decreased awareness of deficits Restrictions Weight Bearing Restrictions: No Pain:  See FIM for current functional status  Therapy/Group: Individual Therapy  Levone Otten OTA/S Occupational Therapy Assistant Student 08/03/2012, 12:20 PM

## 2012-08-03 NOTE — Progress Notes (Signed)
Speech Language Pathology Daily Session Note  Patient Details  Name: April [name] MRN: [id number] Date of Birth: 03/12/1922  Today's Date: 08/03/2012 Time: 6578-4696 Time Calculation (min): 45 min  Short Term Goals: Week 1: SLP Short Term Goal 1 (Week 1): Pt will utilize swallowing compensatory strategies to minimize overt s/s of aspiration with Mod A verbal and visual cues.  SLP Short Term Goal 2 (Week 1): Pt will participate in an objective swallow study to assess dysphagia SLP Short Term Goal 2 - Progress (Week 1): Discontinued (comment) SLP Short Term Goal 3 (Week 1): Pt will utilize visual and contextual cues to orient to place, time and situation with Min A.  SLP Short Term Goal 4 (Week 1): Pt will demonstrate sustained attention to task for 10 minutes with Mod A verbal cues for redirection  SLP Short Term Goal 5 (Week 1): pt will demonstrate functional problem solving for basic and familiar tasks with Mod A verbal and question cues.  SLP Short Term Goal 6 (Week 1): Pt will utilize call bell to request assistance with Mod A question and visual cues.   Skilled Therapeutic Interventions: Pt extremely lethargic throughout the session and required Max A verbal and tactile cues to sustain attention to task for ~60 seconds. Pt required Max A question cues for orientation to place, time and situation. Pt also required  Mod A verbal and visual cues for basic problem solving and initiation with a calendar making task.    FIM:  Comprehension Comprehension Mode: Auditory Comprehension: 3-Understands basic 50 - 74% of the time/requires cueing 25 - 50%  of the time Expression Expression Mode: Verbal Expression: 3-Expresses basic 50 - 74% of the time/requires cueing 25 - 50% of the time. Needs to repeat parts of sentences. Social Interaction Social Interaction: 2-Interacts appropriately 25 - 49% of time - Needs frequent redirection. Problem Solving Problem Solving: 2-Solves basic 25 - 49% of  the time - needs direction more than half the time to initiate, plan or complete simple activities Memory Memory: 2-Recognizes or recalls 25 - 49% of the time/requires cueing 51 - 75% of the time  Pain Pain Assessment Pain Assessment: No/denies pain  Therapy/Group: Individual Therapy  April [name] 08/03/2012, 3:37 PM

## 2012-08-04 ENCOUNTER — Inpatient Hospital Stay (HOSPITAL_COMMUNITY): Payer: Medicare Other | Admitting: Physical Therapy

## 2012-08-04 ENCOUNTER — Inpatient Hospital Stay (HOSPITAL_COMMUNITY): Payer: Medicare Other | Admitting: Occupational Therapy

## 2012-08-04 ENCOUNTER — Inpatient Hospital Stay (HOSPITAL_COMMUNITY): Payer: Medicare Other | Admitting: Speech Pathology

## 2012-08-04 DIAGNOSIS — E119 Type 2 diabetes mellitus without complications: Secondary | ICD-10-CM

## 2012-08-04 DIAGNOSIS — I10 Essential (primary) hypertension: Secondary | ICD-10-CM

## 2012-08-04 DIAGNOSIS — N179 Acute kidney failure, unspecified: Secondary | ICD-10-CM

## 2012-08-04 DIAGNOSIS — I6200 Nontraumatic subdural hemorrhage, unspecified: Secondary | ICD-10-CM

## 2012-08-04 LAB — GLUCOSE, CAPILLARY: Glucose-Capillary: 141 mg/dL — ABNORMAL HIGH (ref 70–99)

## 2012-08-04 MED ORDER — BUTALBITAL-APAP-CAFFEINE 50-325-40 MG PO TABS
1.0000 | ORAL_TABLET | Freq: Two times a day (BID) | ORAL | Status: DC | PRN
  Administered 2012-08-04 – 2012-08-06 (×2): 1 via ORAL
  Filled 2012-08-04 (×2): qty 1

## 2012-08-04 NOTE — Progress Notes (Signed)
Speech Language Pathology Daily Session Note  Patient Details  Name: April [name] MRN: [id number] Date of Birth: 1921-07-18  Today's Date: 08/04/2012 Time: 4098-1191 Time Calculation (min): 30 min  Short Term Goals: Week 1: SLP Short Term Goal 1 (Week 1): Pt will utilize swallowing compensatory strategies to minimize overt s/s of aspiration with Mod A verbal and visual cues.  SLP Short Term Goal 2 (Week 1): Pt will participate in an objective swallow study to assess dysphagia SLP Short Term Goal 2 - Progress (Week 1): Discontinued (comment) SLP Short Term Goal 3 (Week 1): Pt will utilize visual and contextual cues to orient to place, time and situation with Min A.  SLP Short Term Goal 4 (Week 1): Pt will demonstrate sustained attention to task for 10 minutes with Mod A verbal cues for redirection  SLP Short Term Goal 5 (Week 1): pt will demonstrate functional problem solving for basic and familiar tasks with Mod A verbal and question cues.  SLP Short Term Goal 6 (Week 1): Pt will utilize call bell to request assistance with Mod A question and visual cues.   Skilled Therapeutic Interventions: Skilled treatment session focused on addressing arousal.  Patient extremely lethargic throughout the session and required max assist verbal and tactile cues to sustain attention to basic sorting task for ~30 seconds. SLP also facilitated session with max assist verbal and visual cues to read external aid for orientation to place, time and situation.    FIM:  Comprehension Comprehension Mode: Auditory Comprehension: 3-Understands basic 50 - 74% of the time/requires cueing 25 - 50%  of the time Expression Expression Mode: Verbal Expression: 3-Expresses basic 50 - 74% of the time/requires cueing 25 - 50% of the time. Needs to repeat parts of sentences. Social Interaction Social Interaction: 2-Interacts appropriately 25 - 49% of time - Needs frequent redirection. Problem Solving Problem Solving: 2-Solves  basic 25 - 49% of the time - needs direction more than half the time to initiate, plan or complete simple activities Memory Memory: 2-Recognizes or recalls 25 - 49% of the time/requires cueing 51 - 75% of the time  Pain Pain Assessment Pain Assessment: No/denies pain  Therapy/Group: Individual Therapy  Charlane Ferretti., CCC-SLP [phone]  Cayle Thunder 08/04/2012, 3:51 PM

## 2012-08-04 NOTE — Progress Notes (Signed)
Physical Therapy Session Note  Patient Details  Name: Chevette Fee MRN: [id number] Date of Birth: 11/23/1921  Today's Date: 08/04/2012 Time: 1478-2956 Time Calculation (min): 45 min  Short Term Goals: Week 1:  PT Short Term Goal 1 (Week 1): =long term goals  Therapy Documentation Precautions:  Precautions: Fall Precaution Comments: Decreased safety awareness, decreased awareness of deficits Restrictions Weight Bearing Restrictions: No Pain: denies pain  Gait Training:(15') 2 x 200' and 1 x 100' using RW with S/Mod-I for walking straight ahead but needing min-A on RW for negotiating turns Therapeutic Exercise:(15') Kinetron x 5' while seated in w/c but patient very sleepy and needing verbal cues to stay awake and to participate Therapeutic Activity:(15') Transfer training sit->stand with min-A into RW and stand->sit with min-A and verbal cues for safety.  Patient has ongoing need for supervision/min-A for safety.  See FIM for current functional status  Therapy/Group: Individual Therapy  Chandani Rogowski J 08/04/2012, 1:06 PM

## 2012-08-04 NOTE — Progress Notes (Signed)
Physical Therapy Session Note  Patient Details  Name: April [name] MRN: [id number] Date of Birth: [birth year]  Today's Date: 08/04/2012 Time: 0830-0930 Time Calculation (min): 60 min  Short Term Goals: Week 1:  PT Short Term Goal 1 (Week 1): =long term goals  Therapy Documentation Precautions:  Precautions: Fall Precaution Comments: Decreased safety awareness, decreased awareness of deficits Restrictions Weight Bearing Restrictions: No Pain: Was c/o headache but nursing administered pain meds and no c/o pain by time of PT session.  Therapeutic Exercise:(30') B LE's in sitting and supine, Kinetron x 10' sitting in w/c and resistance/speed at 50cm/sec Therapeutic Activity:(15') Transfer Training sit<->stand with S/min-A with patient needing verbal and tactile cues for safety  (reaching back with hand after positioning well to sit). Patient tends to sit when not in good position and keeps hands on RW. Gait Training:(15') 2 x 30' inside room with turns and negotiating furniture with S/min-A on RW to lead with turns and to place Left hand a bit better on grip.  RW 2 x 150' with S/Mod-I on straight-aways but with min-A for turns.   Therapy/Group: Individual Therapy  Rex Kras 08/04/2012, 8:44 AM

## 2012-08-04 NOTE — Progress Notes (Signed)
Occupational Therapy Session Note  Patient Details  Name: April [name] MRN: [id number] Date of Birth: 30-Dec-1921  Today's Date: 08/04/2012 Time: 1040-1140 Time Calculation (min): 60 min   Skilled Therapeutic Interventions/Progress Updates: Patient difficult to arouse to participate in therapy this am.  Once arouse she was able to walk to bathroom with Min A via RW for toilet transfer and complete bathing there.   Came back to sink for dressing and oral care and required constant cues to stay awake.  Focus was awareness, left attention and using L UE as assist and LB dressing and standing balance.     Therapy Documentation Precautions:  Precautions Precautions: Fall Precaution Comments: Decreased safety awareness, decreased awareness of deficits Restrictions Weight Bearing Restrictions: No  Pain: Pain Assessment Pain Assessment: No/denies pain  See FIM for current functional status  Therapy/Group: Individual Therapy  Bud Face [facility] 08/04/2012, 3:55 PM

## 2012-08-04 NOTE — Progress Notes (Signed)
April [name] is a 79 y.o. female March 05, 1922 [id number]  Subjective: C/o HAs. No new problems. Slept well. Feeling OK.  Objective: Vital signs in last 24 hours: Temp:  [98.5 F (36.9 C)] 98.5 F (36.9 C) (03/01 0513) Pulse Rate:  [68-79] 79 (03/01 0513) Resp:  [17-18] 17 (03/01 0513) BP: (162-189)/(64-78) 189/64 mmHg (03/01 0513) SpO2:  [98 %-100 %] 98 % (03/01 0513) Weight change:  Last BM Date: 08/03/12  Intake/Output from previous day: 02/28 0701 - 03/01 0700 In: 600 [P.O.:600] Out: -  Last cbgs: CBG (last 3)   Recent Labs  08/03/12 1620 08/03/12 2053 08/04/12 0736  GLUCAP 179* 132* 141*     Physical Exam General: No apparent distress    HEENT: moist mucosa Lungs: Normal effort. Lungs clear to auscultation, no crackles or wheezes. Cardiovascular: Regular rate and rhythm, no edema Musculoskeletal:  No change from before Neurological: No new neurological deficits Wounds: N/A    Skin: clear Alert, cooperative, confused   Lab Results: BMET    Component Value Date/Time   NA 141 08/03/2012 1135   K 3.4* 08/03/2012 1135   CL 107 08/03/2012 1135   CO2 25 08/03/2012 1135   GLUCOSE 173* 08/03/2012 1135   BUN 19 08/03/2012 1135   CREATININE 2.19* 08/03/2012 1135   CALCIUM 8.6 08/03/2012 1135   GFRNONAA 19* 08/03/2012 1135   GFRAA 22* 08/03/2012 1135   CBC    Component Value Date/Time   WBC 6.7 07/31/2012 0545   RBC 2.96* 07/31/2012 0545   HGB 7.7* 07/31/2012 0545   HCT 23.2* 07/31/2012 0545   PLT 325 07/31/2012 0545   MCV 78.4 07/31/2012 0545   MCH 26.0 07/31/2012 0545   MCHC 33.2 07/31/2012 0545   RDW 15.2 07/31/2012 0545   LYMPHSABS 1.6 07/31/2012 0545   MONOABS 1.1* 07/31/2012 0545   EOSABS 0.2 07/31/2012 0545   BASOSABS 0.0 07/31/2012 0545    Studies/Results: No results found.  Medications: I have reviewed the patient's current medications.  Assessment/Plan:   Medical Problem List and Plan:  1. Acute on chronic right SDH. Status post right frontal parietal  craniotomy 07/23/2012. Plan followup cranial CT scan 08/05/2012 for monitoring of any increase in size of right sided subdural collection. Stable neurologically at present.  -follow up CT next week.  2. DVT Prophylaxis/Anticoagulation: SCDs. Monitor for any signs of DVT  3. Pain Management: Tylenol as needed  4. Neuropsych: This patient is not capable of making decisions on his/her own behalf.  5. Seizure prophylaxis. Keppra 500 mg twice a day. Monitor for any seizure activity  6. Diabetes mellitus peripheral neuropathy. Hemoglobin A1c of 12.3. Tradjenta 5 mg daily. Check blood sugars a.c. and at bedtime  -sugars remain under control at present.  7. Hypertension. Coreg 25 mg twice a day, clonidine patch change every 7 days and Avapro 300 mg, norvasc 10 daily.  -bp's still elevated.  -cont low dose HCTZ  -recheck BMET monday given CRI.  8. ?mild reactive airway disease/wheezing. Added combivent MDI prn yesterday.  -no signs of aspiration.  9. HA - see meds    Length of stay, days: 5  Sonda Primes , MD 08/04/2012, 8:05 AM

## 2012-08-05 ENCOUNTER — Inpatient Hospital Stay (HOSPITAL_COMMUNITY): Payer: Medicare Other | Admitting: Occupational Therapy

## 2012-08-05 LAB — GLUCOSE, CAPILLARY
Glucose-Capillary: 152 mg/dL — ABNORMAL HIGH (ref 70–99)
Glucose-Capillary: 88 mg/dL (ref 70–99)
Glucose-Capillary: 162 mg/dL — ABNORMAL HIGH (ref 70–99)

## 2012-08-05 MED ORDER — HYDRALAZINE HCL 10 MG PO TABS
10.0000 mg | ORAL_TABLET | Freq: Two times a day (BID) | ORAL | Status: DC
  Administered 2012-08-05 – 2012-08-06 (×3): 10 mg via ORAL
  Filled 2012-08-05 (×6): qty 1

## 2012-08-05 NOTE — Progress Notes (Signed)
April [name] is a 79 y.o. female 20-May-1922 [id number]  Subjective: She is asleep. No new problems. Slept well. Feeling OK.  Objective: Vital signs in last 24 hours: Temp:  [97.5 F (36.4 C)-98.3 F (36.8 C)] 98.3 F (36.8 C) (03/02 0510) Pulse Rate:  [68-83] 83 (03/02 0510) Resp:  [17-18] 18 (03/02 0510) BP: (179)/(82-83) 179/82 mmHg (03/02 0510) SpO2:  [97 %-98 %] 98 % (03/02 0510) Weight change:  Last BM Date: 08/03/12  Intake/Output from previous day: 03/01 0701 - 03/02 0700 In: 240 [P.O.:240] Out: -  Last cbgs: CBG (last 3)   Recent Labs  08/04/12 1655 08/04/12 2045 08/05/12 0753  GLUCAP 116* 178* 163*     Physical Exam General: No apparent distress    HEENT: moist mucosa Lungs: Normal effort. Lungs clear to auscultation, no crackles or wheezes. Cardiovascular: Regular rate and rhythm, no edema Musculoskeletal:  No change from before Neurological: No new neurological deficits Wounds: N/A    Skin: clear Alert, cooperative, confused   Lab Results: BMET    Component Value Date/Time   NA 141 08/03/2012 1135   K 3.4* 08/03/2012 1135   CL 107 08/03/2012 1135   CO2 25 08/03/2012 1135   GLUCOSE 173* 08/03/2012 1135   BUN 19 08/03/2012 1135   CREATININE 2.19* 08/03/2012 1135   CALCIUM 8.6 08/03/2012 1135   GFRNONAA 19* 08/03/2012 1135   GFRAA 22* 08/03/2012 1135   CBC    Component Value Date/Time   WBC 6.7 07/31/2012 0545   RBC 2.96* 07/31/2012 0545   HGB 7.7* 07/31/2012 0545   HCT 23.2* 07/31/2012 0545   PLT 325 07/31/2012 0545   MCV 78.4 07/31/2012 0545   MCH 26.0 07/31/2012 0545   MCHC 33.2 07/31/2012 0545   RDW 15.2 07/31/2012 0545   LYMPHSABS 1.6 07/31/2012 0545   MONOABS 1.1* 07/31/2012 0545   EOSABS 0.2 07/31/2012 0545   BASOSABS 0.0 07/31/2012 0545    Studies/Results: No results found.  Medications: I have reviewed the patient's current medications.  Assessment/Plan:   Medical Problem List and Plan:  1. Acute on chronic right SDH. Status post right  frontal parietal craniotomy 07/23/2012. Plan followup cranial CT scan 08/05/2012 for monitoring of any increase in size of right sided subdural collection. Stable neurologically at present.  -follow up CT next week.  2. DVT Prophylaxis/Anticoagulation: SCDs. Monitor for any signs of DVT  3. Pain Management: Tylenol as needed  4. Neuropsych: This patient is not capable of making decisions on his/her own behalf.  5. Seizure prophylaxis. Keppra 500 mg twice a day. Monitor for any seizure activity  6. Diabetes mellitus peripheral neuropathy. Hemoglobin A1c of 12.3. Tradjenta 5 mg daily. Check blood sugars a.c. and at bedtime  -sugars remain under control at present.  7. Hypertension. Coreg 25 mg twice a day, clonidine patch change every 7 days and Avapro 300 mg, norvasc 10 daily.  -bp's still elevated.  -cont low dose HCTZ . Will add Hydralazine w/caution and titrate up -recheck BMET monday given CRI.  8. ?mild reactive airway disease/wheezing. Added combivent MDI prn yesterday.  -no signs of aspiration.  9. HA - resolved    Length of stay, days: 6  Sonda Primes , MD 08/05/2012, 8:10 AM

## 2012-08-05 NOTE — Progress Notes (Signed)
Occupational Therapy Session Note  Patient Details  Name: April [name] MRN: [id number] Date of Birth: 05/16/1922  Today's Date: 08/05/2012 Time: 9811-9147 Time Calculation (min): 38 min  Skilled Therapeutic Interventions/Progress Updates: Patient required tactile and verbal cues and Min to Mod A to participate in using Nu Step for increasing overall patient conditioning to ease functional mobility, awareness, cognition and self care tasks.    Patient kept falling asleep and began to snore after about 38 min of clinician attempting to prompt her to participate in therapy.    Therapy Documentation Precautions: fall   Pain: denied  See FIM for current functional status  Therapy/Group: Individual Therapy  Bud Face [facility] 08/05/2012, 3:05 PM

## 2012-08-05 NOTE — Progress Notes (Signed)
Continues to require restraints for poor safety awareness and decreased initiation. Alert and oriented to self. Bed alarm also in use.  Incontinent and continent of voiding with timed toileting. Doesn't use call light when wet. April [name] A

## 2012-08-06 ENCOUNTER — Encounter (HOSPITAL_COMMUNITY): Payer: Self-pay | Admitting: Anesthesiology

## 2012-08-06 ENCOUNTER — Inpatient Hospital Stay (HOSPITAL_COMMUNITY): Payer: Medicare Other | Admitting: Physical Therapy

## 2012-08-06 ENCOUNTER — Inpatient Hospital Stay (HOSPITAL_COMMUNITY): Payer: Medicare Other

## 2012-08-06 ENCOUNTER — Inpatient Hospital Stay (HOSPITAL_COMMUNITY)
Admission: AD | Admit: 2012-08-06 | Discharge: 2012-08-13 | DRG: 025 | Disposition: A | Discharge status: Rehabilitation Facility | Payer: Medicare Other | Source: Other Acute Inpatient Hospital | Attending: Neurosurgery | Admitting: Neurosurgery

## 2012-08-06 ENCOUNTER — Inpatient Hospital Stay (HOSPITAL_COMMUNITY): Payer: Medicare Other | Admitting: Anesthesiology

## 2012-08-06 ENCOUNTER — Inpatient Hospital Stay (HOSPITAL_COMMUNITY): Payer: Medicare Other | Admitting: Speech Pathology

## 2012-08-06 ENCOUNTER — Encounter (HOSPITAL_COMMUNITY)
Admission: AD | Disposition: A | Discharge status: Rehabilitation Facility | Payer: Self-pay | Source: Other Acute Inpatient Hospital | Attending: Neurosurgery

## 2012-08-06 DIAGNOSIS — J9600 Acute respiratory failure, unspecified whether with hypoxia or hypercapnia: Secondary | ICD-10-CM

## 2012-08-06 DIAGNOSIS — J189 Pneumonia, unspecified organism: Secondary | ICD-10-CM

## 2012-08-06 DIAGNOSIS — S065X9A Traumatic subdural hemorrhage with loss of consciousness of unspecified duration, initial encounter: Secondary | ICD-10-CM

## 2012-08-06 DIAGNOSIS — G609 Hereditary and idiopathic neuropathy, unspecified: Secondary | ICD-10-CM | POA: Diagnosis present

## 2012-08-06 DIAGNOSIS — IMO0001 Reserved for inherently not codable concepts without codable children: Secondary | ICD-10-CM | POA: Diagnosis present

## 2012-08-06 DIAGNOSIS — I10 Essential (primary) hypertension: Secondary | ICD-10-CM

## 2012-08-06 DIAGNOSIS — Y9210 Unspecified residential institution as the place of occurrence of the external cause: Secondary | ICD-10-CM | POA: Diagnosis present

## 2012-08-06 DIAGNOSIS — N183 Chronic kidney disease, stage 3 unspecified: Secondary | ICD-10-CM

## 2012-08-06 DIAGNOSIS — I6200 Nontraumatic subdural hemorrhage, unspecified: Principal | ICD-10-CM | POA: Diagnosis present

## 2012-08-06 DIAGNOSIS — S065XAA Traumatic subdural hemorrhage with loss of consciousness status unknown, initial encounter: Secondary | ICD-10-CM

## 2012-08-06 DIAGNOSIS — Z833 Family history of diabetes mellitus: Secondary | ICD-10-CM

## 2012-08-06 DIAGNOSIS — E119 Type 2 diabetes mellitus without complications: Secondary | ICD-10-CM

## 2012-08-06 DIAGNOSIS — J9819 Other pulmonary collapse: Secondary | ICD-10-CM | POA: Diagnosis not present

## 2012-08-06 DIAGNOSIS — D649 Anemia, unspecified: Secondary | ICD-10-CM

## 2012-08-06 DIAGNOSIS — D631 Anemia in chronic kidney disease: Secondary | ICD-10-CM | POA: Diagnosis present

## 2012-08-06 DIAGNOSIS — E785 Hyperlipidemia, unspecified: Secondary | ICD-10-CM | POA: Diagnosis present

## 2012-08-06 DIAGNOSIS — Z86718 Personal history of other venous thrombosis and embolism: Secondary | ICD-10-CM

## 2012-08-06 DIAGNOSIS — G9349 Other encephalopathy: Secondary | ICD-10-CM | POA: Diagnosis present

## 2012-08-06 DIAGNOSIS — J988 Other specified respiratory disorders: Secondary | ICD-10-CM | POA: Diagnosis not present

## 2012-08-06 DIAGNOSIS — Z79899 Other long term (current) drug therapy: Secondary | ICD-10-CM

## 2012-08-06 DIAGNOSIS — G8190 Hemiplegia, unspecified affecting unspecified side: Secondary | ICD-10-CM | POA: Diagnosis present

## 2012-08-06 DIAGNOSIS — I82409 Acute embolism and thrombosis of unspecified deep veins of unspecified lower extremity: Secondary | ICD-10-CM

## 2012-08-06 DIAGNOSIS — J95821 Acute postprocedural respiratory failure: Secondary | ICD-10-CM | POA: Diagnosis not present

## 2012-08-06 DIAGNOSIS — I129 Hypertensive chronic kidney disease with stage 1 through stage 4 chronic kidney disease, or unspecified chronic kidney disease: Secondary | ICD-10-CM | POA: Diagnosis present

## 2012-08-06 DIAGNOSIS — E1165 Type 2 diabetes mellitus with hyperglycemia: Secondary | ICD-10-CM

## 2012-08-06 DIAGNOSIS — Y838 Other surgical procedures as the cause of abnormal reaction of the patient, or of later complication, without mention of misadventure at the time of the procedure: Secondary | ICD-10-CM | POA: Diagnosis present

## 2012-08-06 DIAGNOSIS — N179 Acute kidney failure, unspecified: Secondary | ICD-10-CM

## 2012-08-06 DIAGNOSIS — D62 Acute posthemorrhagic anemia: Secondary | ICD-10-CM | POA: Diagnosis not present

## 2012-08-06 DIAGNOSIS — E876 Hypokalemia: Secondary | ICD-10-CM | POA: Diagnosis not present

## 2012-08-06 DIAGNOSIS — I498 Other specified cardiac arrhythmias: Secondary | ICD-10-CM | POA: Diagnosis not present

## 2012-08-06 DIAGNOSIS — Z794 Long term (current) use of insulin: Secondary | ICD-10-CM

## 2012-08-06 HISTORY — PX: CRANIOTOMY: SHX93

## 2012-08-06 LAB — CBC WITH DIFFERENTIAL/PLATELET
Basophils Absolute: 0 10*3/uL (ref 0.0–0.1)
Basophils Absolute: 0 10*3/uL (ref 0.0–0.1)
Basophils Relative: 1 % (ref 0–1)
Basophils Relative: 0 % (ref 0–1)
Eosinophils Absolute: 0.1 10*3/uL (ref 0.0–0.7)
Eosinophils Relative: 1 % (ref 0–5)
HCT: 27.1 % — ABNORMAL LOW (ref 36.0–46.0)
HCT: 27.5 % — ABNORMAL LOW (ref 36.0–46.0)
Hemoglobin: 9.2 g/dL — ABNORMAL LOW (ref 12.0–15.0)
Lymphocytes Relative: 19 % (ref 12–46)
Lymphs Abs: 1.3 10*3/uL (ref 0.7–4.0)
MCH: 26.1 pg (ref 26.0–34.0)
MCHC: 33.9 g/dL (ref 30.0–36.0)
MCHC: 33.5 g/dL (ref 30.0–36.0)
MCV: 77.9 fL — ABNORMAL LOW (ref 78.0–100.0)
Monocytes Absolute: 0.5 10*3/uL (ref 0.1–1.0)
Monocytes Absolute: 0.5 10*3/uL (ref 0.1–1.0)
Monocytes Relative: 8 % (ref 3–12)
Neutro Abs: 4.1 10*3/uL (ref 1.7–7.7)
Neutro Abs: 5 10*3/uL (ref 1.7–7.7)
Neutrophils Relative %: 73 % (ref 43–77)
Platelets: 368 10*3/uL (ref 150–400)
Platelets: 369 10*3/uL (ref 150–400)
RBC: 3.53 MIL/uL — ABNORMAL LOW (ref 3.87–5.11)
RDW: 15.1 % (ref 11.5–15.5)
RDW: 15 % (ref 11.5–15.5)
WBC: 6.1 10*3/uL (ref 4.0–10.5)
WBC: 6.9 10*3/uL (ref 4.0–10.5)

## 2012-08-06 LAB — GLUCOSE, CAPILLARY
Glucose-Capillary: 130 mg/dL — ABNORMAL HIGH (ref 70–99)
Glucose-Capillary: 163 mg/dL — ABNORMAL HIGH (ref 70–99)

## 2012-08-06 LAB — BASIC METABOLIC PANEL
BUN: 19 mg/dL (ref 6–23)
BUN: 19 mg/dL (ref 6–23)
CO2: 25 mEq/L (ref 19–32)
Calcium: 9 mg/dL (ref 8.4–10.5)
Calcium: 8.9 mg/dL (ref 8.4–10.5)
Chloride: 105 mEq/L (ref 96–112)
Chloride: 105 mEq/L (ref 96–112)
Creatinine, Ser: 2.09 mg/dL — ABNORMAL HIGH (ref 0.50–1.10)
Creatinine, Ser: 2.02 mg/dL — ABNORMAL HIGH (ref 0.50–1.10)
GFR calc Af Amer: 23 mL/min — ABNORMAL LOW (ref >90)
GFR calc Af Amer: 24 mL/min — ABNORMAL LOW (ref >90)
GFR calc non Af Amer: 20 mL/min — ABNORMAL LOW (ref >90)
GFR calc non Af Amer: 21 mL/min — ABNORMAL LOW (ref >90)
Glucose, Bld: 150 mg/dL — ABNORMAL HIGH (ref 70–99)
Potassium: 3.2 mEq/L — ABNORMAL LOW (ref 3.5–5.1)
Sodium: 140 mEq/L (ref 135–145)

## 2012-08-06 LAB — BLOOD GAS, ARTERIAL
Bicarbonate: 22.3 mEq/L (ref 20.0–24.0)
PEEP: 5 cmH2O
pCO2 arterial: 42.8 mmHg (ref 35.0–45.0)
pH, Arterial: 7.337 — ABNORMAL LOW (ref 7.350–7.450)
pO2, Arterial: 120 mmHg — ABNORMAL HIGH (ref 80.0–100.0)

## 2012-08-06 SURGERY — CRANIOTOMY HEMATOMA EVACUATION SUBDURAL
Anesthesia: General | Site: Head | Wound class: Clean

## 2012-08-06 MED ORDER — DEXTROSE 5 % IV SOLN
2.0000 g | INTRAVENOUS | Status: DC | PRN
  Administered 2012-08-06: 2 g via INTRAVENOUS

## 2012-08-06 MED ORDER — ATORVASTATIN CALCIUM 40 MG PO TABS
40.0000 mg | ORAL_TABLET | Freq: Every day | ORAL | Status: DC
  Administered 2012-08-07 – 2012-08-12 (×5): 40 mg via ORAL
  Filled 2012-08-06 (×8): qty 1

## 2012-08-06 MED ORDER — CARVEDILOL 25 MG PO TABS
25.0000 mg | ORAL_TABLET | Freq: Two times a day (BID) | ORAL | Status: DC
  Filled 2012-08-06 (×2): qty 1

## 2012-08-06 MED ORDER — MORPHINE SULFATE 2 MG/ML IJ SOLN
1.0000 mg | INTRAMUSCULAR | Status: DC | PRN
Start: 2012-08-06 — End: 2012-08-09
  Administered 2012-08-06 – 2012-08-08 (×3): 1 mg via INTRAVENOUS
  Filled 2012-08-06 (×3): qty 1

## 2012-08-06 MED ORDER — BUPIVACAINE HCL (PF) 0.25 % IJ SOLN
INTRAMUSCULAR | Status: DC | PRN
  Administered 2012-08-06: 10 mL

## 2012-08-06 MED ORDER — HEMOSTATIC AGENTS (NO CHARGE) OPTIME
TOPICAL | Status: DC | PRN
  Administered 2012-08-06: 1 via TOPICAL

## 2012-08-06 MED ORDER — NEOSTIGMINE METHYLSULFATE 1 MG/ML IJ SOLN
INTRAMUSCULAR | Status: DC | PRN
  Administered 2012-08-06: 4 mg via INTRAVENOUS

## 2012-08-06 MED ORDER — BACITRACIN 50000 UNITS IM SOLR
INTRAMUSCULAR | Status: AC
  Filled 2012-08-06: qty 1

## 2012-08-06 MED ORDER — LIDOCAINE HCL (CARDIAC) 20 MG/ML IV SOLN
INTRAVENOUS | Status: DC | PRN
  Administered 2012-08-06: 100 mg via INTRAVENOUS

## 2012-08-06 MED ORDER — DEXTROSE 5 % IV SOLN
2.0000 g | Freq: Once | INTRAVENOUS | Status: DC
  Filled 2012-08-06 (×2): qty 2

## 2012-08-06 MED ORDER — CHLORHEXIDINE GLUCONATE 0.12 % MT SOLN
OROMUCOSAL | Status: AC
  Filled 2012-08-06: qty 15

## 2012-08-06 MED ORDER — INSULIN ASPART 100 UNIT/ML ~~LOC~~ SOLN
0.0000 [IU] | SUBCUTANEOUS | Status: DC
  Administered 2012-08-07: 2 [IU] via SUBCUTANEOUS
  Administered 2012-08-07: 3 [IU] via SUBCUTANEOUS
  Administered 2012-08-07 – 2012-08-08 (×2): 2 [IU] via SUBCUTANEOUS
  Administered 2012-08-08: 3 [IU] via SUBCUTANEOUS
  Administered 2012-08-08 – 2012-08-09 (×2): 2 [IU] via SUBCUTANEOUS
  Administered 2012-08-09: 3 [IU] via SUBCUTANEOUS
  Administered 2012-08-10: 2 [IU] via SUBCUTANEOUS
  Administered 2012-08-10: 3 [IU] via SUBCUTANEOUS

## 2012-08-06 MED ORDER — LABETALOL HCL 5 MG/ML IV SOLN
INTRAVENOUS | Status: DC | PRN
  Administered 2012-08-06 (×4): 5 mg via INTRAVENOUS

## 2012-08-06 MED ORDER — THROMBIN 20000 UNITS EX SOLR
CUTANEOUS | Status: DC | PRN
  Administered 2012-08-06: 19:00:00 via TOPICAL

## 2012-08-06 MED ORDER — IRBESARTAN 300 MG PO TABS
300.0000 mg | ORAL_TABLET | Freq: Every day | ORAL | Status: DC
  Administered 2012-08-07 – 2012-08-13 (×7): 300 mg via ORAL
  Filled 2012-08-06 (×8): qty 1

## 2012-08-06 MED ORDER — BIOTENE DRY MOUTH MT LIQD
15.0000 mL | Freq: Four times a day (QID) | OROMUCOSAL | Status: DC
  Administered 2012-08-07 – 2012-08-09 (×10): 15 mL via OROMUCOSAL

## 2012-08-06 MED ORDER — SODIUM CHLORIDE 0.9 % IR SOLN
Status: DC | PRN
  Administered 2012-08-06: 19:00:00

## 2012-08-06 MED ORDER — BISACODYL 10 MG RE SUPP: 10.0000 mg | Freq: Every day | RECTAL | Status: DC | PRN

## 2012-08-06 MED ORDER — NALOXONE HCL 0.4 MG/ML IJ SOLN
INTRAMUSCULAR | Status: AC
  Administered 2012-08-06: 0.2 mg
  Filled 2012-08-06: qty 1

## 2012-08-06 MED ORDER — POTASSIUM CHLORIDE IN NACL 40-0.9 MEQ/L-% IV SOLN
INTRAVENOUS | Status: DC
  Administered 2012-08-06: 21:00:00 via INTRAVENOUS
  Administered 2012-08-06: 100 mL/h via INTRAVENOUS
  Administered 2012-08-08: 08:00:00 via INTRAVENOUS
  Filled 2012-08-06 (×7): qty 1000

## 2012-08-06 MED ORDER — SODIUM CHLORIDE 0.9 % IV SOLN
INTRAVENOUS | Status: AC
  Filled 2012-08-06: qty 500

## 2012-08-06 MED ORDER — 0.9 % SODIUM CHLORIDE (POUR BTL) OPTIME
TOPICAL | Status: DC | PRN
  Administered 2012-08-06 (×2): 1000 mL

## 2012-08-06 MED ORDER — PANTOPRAZOLE SODIUM 40 MG PO PACK
40.0000 mg | PACK | Freq: Every day | ORAL | Status: DC
  Administered 2012-08-07: 40 mg
  Filled 2012-08-06 (×2): qty 20

## 2012-08-06 MED ORDER — MAGNESIUM HYDROXIDE 400 MG/5ML PO SUSP: 30.0000 mL | Freq: Every day | ORAL | Status: DC | PRN

## 2012-08-06 MED ORDER — HYDRALAZINE HCL 20 MG/ML IJ SOLN
5.0000 mg | INTRAMUSCULAR | Status: DC | PRN
  Administered 2012-08-07 – 2012-08-08 (×2): 10 mg via INTRAVENOUS
  Administered 2012-08-08: 5 mg via INTRAVENOUS
  Administered 2012-08-09 – 2012-08-10 (×3): 10 mg via INTRAVENOUS
  Filled 2012-08-06 (×7): qty 1

## 2012-08-06 MED ORDER — ONDANSETRON HCL 4 MG/2ML IJ SOLN
INTRAMUSCULAR | Status: DC | PRN
  Administered 2012-08-06: 4 mg via INTRAVENOUS

## 2012-08-06 MED ORDER — SODIUM CHLORIDE 0.9 % IV SOLN
INTRAVENOUS | Status: DC | PRN
  Administered 2012-08-06: 17:00:00 via INTRAVENOUS

## 2012-08-06 MED ORDER — LABETALOL HCL 5 MG/ML IV SOLN: 5.0000 mg | INTRAVENOUS | Status: DC | PRN

## 2012-08-06 MED ORDER — ROCURONIUM BROMIDE 100 MG/10ML IV SOLN
INTRAVENOUS | Status: DC | PRN
  Administered 2012-08-06: 50 mg via INTRAVENOUS
  Administered 2012-08-06 (×2): 10 mg via INTRAVENOUS

## 2012-08-06 MED ORDER — GLYCOPYRROLATE 0.2 MG/ML IJ SOLN
INTRAMUSCULAR | Status: DC | PRN
  Administered 2012-08-06: .8 mg via INTRAVENOUS

## 2012-08-06 MED ORDER — PROPOFOL 10 MG/ML IV BOLUS
INTRAVENOUS | Status: DC | PRN
  Administered 2012-08-06: 100 mg via INTRAVENOUS

## 2012-08-06 MED ORDER — SODIUM CHLORIDE 0.9 % IV SOLN
500.0000 mg | Freq: Two times a day (BID) | INTRAVENOUS | Status: DC
  Administered 2012-08-06 – 2012-08-10 (×8): 500 mg via INTRAVENOUS
  Filled 2012-08-06 (×12): qty 5

## 2012-08-06 MED ORDER — ONDANSETRON HCL 4 MG/2ML IJ SOLN
4.0000 mg | Freq: Four times a day (QID) | INTRAMUSCULAR | Status: DC | PRN
Start: 2012-08-06 — End: 2012-08-10

## 2012-08-06 MED ORDER — CHLORHEXIDINE GLUCONATE 0.12 % MT SOLN
15.0000 mL | Freq: Two times a day (BID) | OROMUCOSAL | Status: DC
  Administered 2012-08-06 – 2012-08-08 (×5): 15 mL via OROMUCOSAL
  Filled 2012-08-06 (×4): qty 15

## 2012-08-06 MED ORDER — CLONIDINE HCL 0.3 MG/24HR TD PTWK
0.3000 mg | MEDICATED_PATCH | TRANSDERMAL | Status: DC
  Administered 2012-08-06: 0.3 mg via TRANSDERMAL
  Filled 2012-08-06: qty 1

## 2012-08-06 MED ORDER — LIDOCAINE-EPINEPHRINE 1 %-1:100000 IJ SOLN
INTRAMUSCULAR | Status: DC | PRN
  Administered 2012-08-06: 10 mL

## 2012-08-06 MED ORDER — FENTANYL CITRATE 0.05 MG/ML IJ SOLN
INTRAMUSCULAR | Status: DC | PRN
  Administered 2012-08-06 (×2): 50 ug via INTRAVENOUS
  Administered 2012-08-06: 100 ug via INTRAVENOUS
  Administered 2012-08-06: 50 ug via INTRAVENOUS

## 2012-08-06 MED ORDER — AMLODIPINE BESYLATE 10 MG PO TABS
10.0000 mg | ORAL_TABLET | Freq: Every day | ORAL | Status: DC
  Administered 2012-08-07 – 2012-08-13 (×7): 10 mg via ORAL
  Filled 2012-08-06 (×8): qty 1

## 2012-08-06 MED ORDER — POTASSIUM CHLORIDE IN NACL 20-0.45 MEQ/L-% IV SOLN
INTRAVENOUS | Status: DC
  Administered 2012-08-06: 11:00:00 via INTRAVENOUS
  Filled 2012-08-06: qty 1000

## 2012-08-06 SURGICAL SUPPLY — 77 items
ADH SKN CLS LQ APL DERMABOND (GAUZE/BANDAGES/DRESSINGS) ×1
APPLICATOR COTTON TIP 6IN STRL (MISCELLANEOUS) ×2 IMPLANT
BAG DECANTER FOR FLEXI CONT (MISCELLANEOUS) ×2 IMPLANT
BANDAGE GAUZE 4  KLING STR (GAUZE/BANDAGES/DRESSINGS) ×1 IMPLANT
BANDAGE GAUZE ELAST BULKY 4 IN (GAUZE/BANDAGES/DRESSINGS) ×4 IMPLANT
BIT DRILL WIRE PASS 1.3MM (BIT) IMPLANT
BRUSH SCRUB EZ PLAIN DRY (MISCELLANEOUS) ×3 IMPLANT
BUR ACORN 6.0 PRECISION (BURR) ×1 IMPLANT
BUR ROUTER D-58 CRANI (BURR) ×1 IMPLANT
CANISTER SUCTION 2500CC (MISCELLANEOUS) ×2 IMPLANT
CLIP TI MEDIUM 6 (CLIP) IMPLANT
CLOTH BEACON ORANGE TIMEOUT ST (SAFETY) ×2 IMPLANT
CONT SPEC 4OZ CLIKSEAL STRL BL (MISCELLANEOUS) ×3 IMPLANT
CORDS BIPOLAR (ELECTRODE) ×2 IMPLANT
DERMABOND ADHESIVE PROPEN (GAUZE/BANDAGES/DRESSINGS) ×1
DERMABOND ADVANCED .7 DNX6 (GAUZE/BANDAGES/DRESSINGS) IMPLANT
DRAIN JACKSON PRATT 10MM FLAT (MISCELLANEOUS) ×2 IMPLANT
DRAIN PENROSE 1/2X12 LTX STRL (WOUND CARE) IMPLANT
DRAIN SNY WOU 7FLT (WOUND CARE) IMPLANT
DRAPE NEUROLOGICAL W/INCISE (DRAPES) ×1 IMPLANT
DRAPE SURG 17X23 STRL (DRAPES) IMPLANT
DRAPE SURG IRRIG POUCH 19X23 (DRAPES) IMPLANT
DRAPE WARM FLUID 44X44 (DRAPE) ×2 IMPLANT
DRILL WIRE PASS 1.3MM (BIT)
DRSG ADAPTIC 3X8 NADH LF (GAUZE/BANDAGES/DRESSINGS) ×2 IMPLANT
DRSG PAD ABDOMINAL 8X10 ST (GAUZE/BANDAGES/DRESSINGS) IMPLANT
ELECT CAUTERY BLADE 6.4 (BLADE) ×2 IMPLANT
ELECT REM PT RETURN 9FT ADLT (ELECTROSURGICAL) ×2
ELECTRODE REM PT RTRN 9FT ADLT (ELECTROSURGICAL) ×1 IMPLANT
EVACUATOR SILICONE 100CC (DRAIN) ×4 IMPLANT
GAUZE SPONGE 4X4 16PLY XRAY LF (GAUZE/BANDAGES/DRESSINGS) IMPLANT
GLOVE BIO SURGEON STRL SZ 6.5 (GLOVE) ×2 IMPLANT
GLOVE BIOGEL PI IND STRL 6.5 (GLOVE) IMPLANT
GLOVE BIOGEL PI IND STRL 7.5 (GLOVE) IMPLANT
GLOVE BIOGEL PI IND STRL 8 (GLOVE) ×1 IMPLANT
GLOVE BIOGEL PI INDICATOR 6.5 (GLOVE) ×1
GLOVE BIOGEL PI INDICATOR 7.5 (GLOVE) ×1
GLOVE BIOGEL PI INDICATOR 8 (GLOVE) ×1
GLOVE ECLIPSE 7.5 STRL STRAW (GLOVE) ×4 IMPLANT
GLOVE EXAM NITRILE LRG STRL (GLOVE) IMPLANT
GLOVE EXAM NITRILE MD LF STRL (GLOVE) ×1 IMPLANT
GLOVE EXAM NITRILE XL STR (GLOVE) IMPLANT
GLOVE EXAM NITRILE XS STR PU (GLOVE) IMPLANT
GOWN BRE IMP SLV AUR LG STRL (GOWN DISPOSABLE) IMPLANT
GOWN BRE IMP SLV AUR XL STRL (GOWN DISPOSABLE) ×4 IMPLANT
GOWN STRL REIN 2XL LVL4 (GOWN DISPOSABLE) IMPLANT
HEMOSTAT SURGICEL 2X14 (HEMOSTASIS) ×2 IMPLANT
HOOK DURA (MISCELLANEOUS) ×2 IMPLANT
KIT BASIN OR (CUSTOM PROCEDURE TRAY) ×2 IMPLANT
KIT ROOM TURNOVER OR (KITS) ×2 IMPLANT
NDL SPNL 22GX3.5 QUINCKE BK (NEEDLE) ×1 IMPLANT
NEEDLE SPNL 22GX3.5 QUINCKE BK (NEEDLE) ×2 IMPLANT
NS IRRIG 1000ML POUR BTL (IV SOLUTION) ×3 IMPLANT
PACK CRANIOTOMY (CUSTOM PROCEDURE TRAY) ×2 IMPLANT
PAD ARMBOARD 7.5X6 YLW CONV (MISCELLANEOUS) ×3 IMPLANT
PATTIES SURGICAL .5 X.5 (GAUZE/BANDAGES/DRESSINGS) IMPLANT
PATTIES SURGICAL .5 X3 (DISPOSABLE) IMPLANT
PATTIES SURGICAL 1/4 X 3 (GAUZE/BANDAGES/DRESSINGS) ×1 IMPLANT
PATTIES SURGICAL 1X1 (DISPOSABLE) IMPLANT
PIN MAYFIELD SKULL DISP (PIN) ×1 IMPLANT
SPECIMEN JAR SMALL (MISCELLANEOUS) IMPLANT
SPONGE GAUZE 4X4 12PLY (GAUZE/BANDAGES/DRESSINGS) ×1 IMPLANT
SPONGE LAP 4X18 X RAY DECT (DISPOSABLE) ×1 IMPLANT
SPONGE NEURO XRAY DETECT 1X3 (DISPOSABLE) IMPLANT
SPONGE SURGIFOAM ABS GEL 100 (HEMOSTASIS) ×2 IMPLANT
STAPLER SKIN PROX WIDE 3.9 (STAPLE) ×3 IMPLANT
SUT ETHILON 3 0 FSL (SUTURE) ×2 IMPLANT
SUT NURALON 4 0 TR CR/8 (SUTURE) ×4 IMPLANT
SUT VIC AB 2-0 CP2 18 (SUTURE) ×4 IMPLANT
SYR 20ML ECCENTRIC (SYRINGE) ×2 IMPLANT
SYR CONTROL 10ML LL (SYRINGE) ×2 IMPLANT
TOWEL OR 17X24 6PK STRL BLUE (TOWEL DISPOSABLE) ×2 IMPLANT
TOWEL OR 17X26 10 PK STRL BLUE (TOWEL DISPOSABLE) ×2 IMPLANT
TRAP SPECIMEN MUCOUS 40CC (MISCELLANEOUS) IMPLANT
TRAY FOLEY CATH 16FRSI W/METER (SET/KITS/TRAYS/PACK) ×1 IMPLANT
UNDERPAD 30X30 INCONTINENT (UNDERPADS AND DIAPERS) IMPLANT
WATER STERILE IRR 1000ML POUR (IV SOLUTION) ×2 IMPLANT

## 2012-08-06 NOTE — Progress Notes (Signed)
Speech Language Pathology Daily Session Note  Patient Details  Name: April [name] MRN: [id number] Date of Birth: 1922/05/14  Today's Date: 08/06/2012 Time: 0900-0930 Time Calculation (min): 30 min  Short Term Goals: Week 1: SLP Short Term Goal 1 (Week 1): Pt will utilize swallowing compensatory strategies to minimize overt s/s of aspiration with Mod A verbal and visual cues.  SLP Short Term Goal 2 (Week 1): Pt will participate in an objective swallow study to assess dysphagia SLP Short Term Goal 2 - Progress (Week 1): Discontinued (comment) SLP Short Term Goal 3 (Week 1): Pt will utilize visual and contextual cues to orient to place, time and situation with Min A.  SLP Short Term Goal 4 (Week 1): Pt will demonstrate sustained attention to task for 10 minutes with Mod A verbal cues for redirection  SLP Short Term Goal 5 (Week 1): pt will demonstrate functional problem solving for basic and familiar tasks with Mod A verbal and question cues.  SLP Short Term Goal 6 (Week 1): Pt will utilize call bell to request assistance with Mod A question and visual cues.   Skilled Therapeutic Interventions: Skilled treatment session focused on addressing arousal. Patient extremely lethargic throughout the session and required max-total assist verbal and tactile cues to sustain attention to self-feeding task for ~30 seconds. Pt unable to actively participate in self-feeding task and consumed 50% of thin liquids via straw with total A.    FIM:  Comprehension Comprehension Mode: Auditory Comprehension: 2-Understands basic 25 - 49% of the time/requires cueing 51 - 75% of the time Expression Expression Mode: Verbal Expression: 2-Expresses basic 25 - 49% of the time/requires cueing 50 - 75% of the time. Uses single words/gestures. Social Interaction Social Interaction: 1-Interacts appropriately less than 25% of the time. May be withdrawn or combative. Problem Solving Problem Solving: 1-Solves basic less than  25% of the time - needs direction nearly all the time or does not effectively solve problems and may need a restraint for safety Memory Memory: 1-Recognizes or recalls less than 25% of the time/requires cueing greater than 75% of the time FIM - Eating Eating Activity: 1: Helper feeds patient  Pain Pain Assessment Pain Assessment: No/denies pain  Therapy/Group: Individual Therapy  PAYNE, COURTNEY 08/06/2012, 10:59 AM

## 2012-08-06 NOTE — Progress Notes (Signed)
At approximately 2020 pt not responsiive and sats decreasing to upper 70s. Dr. Newell Coral at bedside. Anesthesia called and crna called back t bedside.  Patient reintubated by dr. Ivin Booty.

## 2012-08-06 NOTE — Anesthesia Preprocedure Evaluation (Addendum)
Anesthesia Evaluation  Patient identified by MRN, date of birth, ID band Patient awake    Reviewed: Allergy & Precautions, H&P , NPO status , reviewed documented beta blocker date and time   History of Anesthesia Complications Negative for: history of anesthetic complications  Airway Mallampati: II TM Distance: >3 FB Neck ROM: Full    Dental  (+) Missing, Poor Dentition and Dental Advisory Given   Pulmonary pneumonia -, resolved,          Cardiovascular hypertension, Pt. on medications and Pt. on home beta blockers     Neuro/Psych PSYCHIATRIC DISORDERS negative neurological ROS     GI/Hepatic   Endo/Other  diabetes, Type 2, Insulin Dependent  Renal/GU CRF and ARFRenal disease     Musculoskeletal negative musculoskeletal ROS (+)   Abdominal   Peds  Hematology negative hematology ROS (+)   Anesthesia Other Findings   Reproductive/Obstetrics negative OB ROS                          Anesthesia Physical Anesthesia Plan  ASA: III and emergent  Anesthesia Plan: General   Post-op Pain Management:    Induction: Intravenous  Airway Management Planned: Oral ETT  Additional Equipment:   Intra-op Plan:   Post-operative Plan: Extubation in OR  Informed Consent: I have reviewed the patients History and Physical, chart, labs and discussed the procedure including the risks, benefits and alternatives for the proposed anesthesia with the patient or authorized representative who has indicated his/her understanding and acceptance.     Plan Discussed with: CRNA and Surgeon  Anesthesia Plan Comments:         Anesthesia Quick Evaluation

## 2012-08-06 NOTE — Op Note (Signed)
08/06/2012  7:36 PM  PATIENT:  April [name]  79 y.o. female  PRE-OPERATIVE DIAGNOSIS: Recurrent right hemispheric chronic subdural hematoma, left hemiparesis, altered mental status  POST-OPERATIVE DIAGNOSIS:  Recurrent right hemispheric chronic subdural hematoma, left hemiparesis, altered mental status  PROCEDURE:  Procedure(s): CRANIOTOMY HEMATOMA EVACUATION SUBDURAL:  Right frontal parietal craniectomy, implantation of bone flap into right abdominal wall, evacuation of chronic subdural hematoma  SURGEON:  Surgeon(s): [name], MD Barnett Abu, MD  ASSISTANTS: Barnett Abu, M.D.  ANESTHESIA:   general  EBL:  Total I/O In: 500 [I.V.:500] Out: -   BLOOD ADMINISTERED:none  COUNT: Correct per nursing staff  DRAINS: (2 flat 10 mm) Jackson-Pratt drain(s) with closed bulb suction in the Subdural space   SPECIMEN:  No Specimen  DICTATION: Patient was brought to the operating room, placed under general endotracheal anesthesia. The patient was placed in a 3 pin Mayfield head holder, a roll was placed beneath the right shoulder, and the patient was gently turned towards the left. The scalp was shaved, and then prepped with Betadine soap and solution. The abdomen was sterilely prepped with Betadine soap and solution. Both surgical fields were draped in a sterile fashion. We first reopenned the right parasagittal frontal parietal incision. There was chronic subdural fluid in the subgaleal space. The Lorenz cranial screws and plates were removed, and then the bone flap was elevated. We then open the dural sutures, and the dural flap was hinged towards the midline. We gently irrigated the reaccumulated subdural fluid away using saline irrigation and suction. The subdural space was irrigated with saline, until clear. Additional tack ups were placed around the craniotomy using 4-0 Nurolon as needed. We used Surgicel on dural surfaces that had small points of bleeding. We placed 2 10 mm flat  Jackson-Pratt drains in the subdural space. Each was brought out through a separate stab incision, and connected to closed bulb suction. The dura was approximated with interrupted 4-0 Nurolon sutures. The galea was closed with interrupted inverted 2-0 undyed Vicryl sutures. Skin edges were approximated surgical staples. Each of the drains was sutured in place with 3-0 nylon suture. The bone flap was implanted in the right abdominal wall. The line of the incision was infiltrated with local anesthetic with epinephrine. Incision was made and carried down to the subcutaneous tissue. Electrocautery was used to maintain hemostasis. A sub-cutaneous pocket was created, and the bone flap was implanted in the subcutaneous space. The subcutaneous tissues were approximated with interrupted inverted 2-0 Vicryl sutures. We then closed the subcutaneous and subcuticular layer with interrupted inverted 2-0 Vicryl sutures. The skin is approximate Dermabond. The cranial wound was dressed with Adaptic, gauze sponges, and cotton balls, and wrapped with 2 Curlex, and then 2 Kling. Following surgery the patient was taken out of the 3 pin Mayfield head holder, reversed and the anesthetic, extubated, and is to be transferred to the neurosurgical intensive care unit for further care. In the operating room following extubation she was noted to be moving all 4 extremities, not to command, moving the right side better than the left side.  PLAN OF CARE: Admit to inpatient   PATIENT DISPOSITION:  ICU - extubated and stable.   Delay start of Pharmacological VTE agent (>24hrs) due to surgical blood loss or risk of bleeding:  yes

## 2012-08-06 NOTE — Consult Note (Signed)
PULMONARY  / CRITICAL CARE MEDICINE  Name: April [name] MRN: [id number] DOB: [birth year]    ADMISSION DATE:  08/06/2012 CONSULTATION DATE:  08/06/2012  REFERRING MD :  Neurosurgery Newell Coral)  CHIEF COMPLAINT:  Acute respiratory failure.  BRIEF PATIENT DESCRIPTION:  79 yo with recurrent R chronic subdural hematoma s/p evacuation, left hemiparesis and acute encephalopathy.  SIGNIFICANT EVENTS / STUDIES:  3/3  Admitted with acute encephalopathy.  Head CT >>> Recurrence of R chronic subdural hematoma with significant mass effect and midline shift.  OR >>> evacuation of hematoma.  LINES / TUBES: OETT  3/3 >>> OGT  3/3 >>> Foley  3/3 >>>  CULTURES:  ANTIBIOTICS: Ceftriaxone  3/3 x1  The patient is encephalopathic and unable to provide history, which was obtained for available medical records.  HISTORY OF PRESENT ILLNESS:  79 yo with recurrent R chronic subdural hematoma s/p evacuation, left hemiparesis and acute encephalopathy.  PAST MEDICAL HISTORY :  Past Medical History  Diagnosis Date  . Diabetes mellitus   . Hypertension   . Hyperlipidemia   . Pneumonia   . Subdural hematoma   . Arthritis   . DVT (deep venous thrombosis)   . Incontinence   . Renal disorder     chronic kidney dz stage III   Past Surgical History  Procedure Laterality Date  . No past surgeries    . Insertion of vena cava filter    . Craniotomy Right 07/23/2012    Procedure: CRANIOTOMY HEMATOMA EVACUATION SUBDURAL;  Surgeon: [name], MD;  Location: [facility];  Service: Neurosurgery;  Laterality: Right;   Prior to Admission medications   Medication Sig Start Date End Date Taking? Authorizing Ottilia Pippenger  amLODipine (NORVASC) 10 MG tablet Take 1 tablet (10 mg total) by mouth daily. 06/13/12  Yes Linna Hoff, MD  atorvastatin (LIPITOR) 40 MG tablet Take 40 mg by mouth every evening.   Yes Historical Azarias Chiou, MD  carvedilol (COREG) 25 MG tablet Take 1 tablet (25 mg total) by mouth 2 (two) times  daily with a meal. 06/19/12  Yes Clanford L Johnson, MD  cloNIDine (CATAPRES - DOSED IN MG/24 HR) 0.3 mg/24hr Place 1 patch (0.3 mg total) onto the skin once a week. 06/24/12  Yes Laveda Norman, MD  hydrochlorothiazide (HYDRODIURIL) 25 MG tablet Take 25 mg by mouth daily.   Yes Historical Coltin Casher, MD  insulin glargine (LANTUS) 100 UNIT/ML injection Inject 20 Units into the skin daily.   Yes Historical Seung Nidiffer, MD  sitaGLIPtin (JANUVIA) 50 MG tablet Take 1 tablet (50 mg total) by mouth daily. 06/13/12  Yes Linna Hoff, MD  telmisartan (MICARDIS) 80 MG tablet Take 1 tablet (80 mg total) by mouth daily. 06/19/12  Yes Clanford Cyndie Mull, MD   No Known Allergies  FAMILY HISTORY:  Family History  Problem Relation Age of Onset  . Diabetes Mellitus II Son    SOCIAL HISTORY:  reports that she has never smoked. She has never used smokeless tobacco. She reports that she does not drink alcohol or use illicit drugs.  REVIEW OF SYSTEMS:  Unable to provide.  INTERVAL HISTORY:  VITAL SIGNS: Temp:  [96.5 F (35.8 C)-98.3 F (36.8 C)] 96.9 F (36.1 C) (03/03 2100) Pulse Rate:  [51-85] 56 (03/03 2200) Resp:  [14-23] 16 (03/03 2200) BP: (142-193)/(53-107) 148/66 mmHg (03/03 2200) SpO2:  [95 %-100 %] 100 % (03/03 2200) FiO2 (%):  [40 %-100 %] 40 % (03/03 2129) Weight:  [62 kg (136 lb 11 oz)] 62  kg (136 lb 11 oz) (03/03 1200) HEMODYNAMICS:   VENTILATOR SETTINGS: Vent Mode:  [-] PRVC FiO2 (%):  [40 %-100 %] 40 % Set Rate:  [15 bmp-18 bmp] 15 bmp Vt Set:  [400 mL-500 mL] 400 mL PEEP:  [5 cmH20] 5 cmH20 Plateau Pressure:  [22 cmH20] 22 cmH20 INTAKE / OUTPUT: Intake/Output     03/03 0701 - 03/04 0700   I.V. (mL/kg) 2400 (38.7)   IV Piggyback 105   Total Intake(mL/kg) 2505 (40.4)   Urine (mL/kg/hr) 350   Blood 100   Total Output 450   Net +2055       Urine Occurrence 2 x    PHYSICAL EXAMINATION: General:  Appears comfortable, mechanically ventilated, synchronous Neuro:  Encephalopathic,  cough / gag diminished HEENT:  PERRL, OETT / OGT Cardiovascular:  RRR, no m/r/g Lungs:  CTAB Abdomen:  Soft, nontender, bowel sounds diminished Musculoskeletal:  Residual left hemiparesis Skin:  Intact  LABS:  Recent Labs Lab 07/31/12 0545 08/03/12 1135 08/06/12 0856 08/06/12 1540 08/06/12 2100  HGB 7.7*  --  9.2* 9.2*  --   WBC 6.7  --  6.1 6.9  --   PLT 325  --  368 369  --   NA 141 141 142 140  --   K 3.5 3.4* 3.0* 3.2*  --   CL 108 107 105 105  --   CO2 22 25 24 25   --   GLUCOSE 105* 173* 193* 150*  --   BUN 23 19 19 19   --   CREATININE 2.23* 2.19* 2.09* 2.02*  --   CALCIUM 8.7 8.6 9.0 8.9  --   AST 25  --   --   --   --   ALT 14  --   --   --   --   ALKPHOS 83  --   --   --   --   BILITOT 0.3  --   --   --   --   PROT 6.0  --   --   --   --   ALBUMIN 1.9*  --   --   --   --   PHART  --   --   --   --  7.337*  PCO2ART  --   --   --   --  42.8  PO2ART  --   --   --   --  120.0*    Recent Labs Lab 08/05/12 2102 08/06/12 0713 08/06/12 1122 08/06/12 1648 08/06/12 2000  GLUCAP 162* 148* 190* 130* 163*   CXR:  3/3 >>> ETT in place, bilateral airspace disease  ASSESSMENT / PLAN:  PULMONARY A:  Acute postop respiratory failure.  Bilateral atelectasis.  History of extubation failure secondary to ? cord edema. P:   Gaol SpO2>92, pH>7.30 Full mechanical support Daily SBT Trend ABG / CXR  CARDIOVASCULAR A: DVT s/p IVC filter placement. HTN.  Bradycardia, likely secondary to Coreg / Clonidine P:  Goal MAP per Neurosurgery Hold Clonidine / Coreg Continue Norvasc, Micardis (caution with renal failure) Hydralazine PRN  RENAL A:  CKD.  Hypokalemia. P:   Trend BMP Defer K supplementation NS @ 100  GASTROINTESTINAL A:  No active issues. P:   NPO as intubated TF if remains intubated > 24 hours Protonix for GI Px  HEMATOLOGIC A:  Anemia, acute on chronic (renal disease and acute blood loss). P:  Trend CBC SCDs for DVT Px  INFECTIOUS A:  No  evidence of  acute infection. P:   No intervention required  ENDOCRINE  A:  DM2, uncontrolled.  Hyperglycemia.  P:   SSI  NEUROLOGIC A:  Recurrent SDH s/p evacuation. P:   Per Neurosurgery Goal RASS 0 to -1 Morphine PRN  TODAY'S SUMMARY: 79 yo with recurrent R chronic subdural hematoma s/p evacuation, left hemiparesis and acute encephalopathy.  Tonight:  full mechanical support, hold Coreg / Clonidine as bradycardia, Hydralazine PRN.  Consider extubation in AM after discussion with Neurosurgery.  May need short course of steroids prior to extubation as history of failed extubation due to vocal cord edema.  I have personally obtained a history, examined the patient, evaluated laboratory and imaging results, formulated the assessment and plan and placed orders.  CRITICAL CARE:  The patient is critically ill with multiple organ systems failure and requires high complexity decision making for assessment and support, frequent evaluation and titration of therapies, application of advanced monitoring technologies and extensive interpretation of multiple databases. Critical Care Time devoted to patient care services described in this note is 35 minutes.   Lonia Farber, MD Pulmonary and Critical Care Medicine [facility] Pager: [phone]  08/06/2012, 10:26 PM

## 2012-08-06 NOTE — Preoperative (Signed)
Beta Blockers   Reason not to administer Beta Blockers:Not Applicable 

## 2012-08-06 NOTE — Progress Notes (Signed)
Patient ID: April [name], female   DOB: 1921/10/01, 79 y.o.   MRN: [id number] Subjective/Complaints: 79 y.o. right-handed female with history of diabetes mellitus and peripheral neuropathy, hypertension and chronic renal insufficiency with baseline 1.80-2.10 as well his recent DVT with IVC filter placed. Admitted 07/23/2012 with noted history of large right hemispheric chronic subdural hematoma. She had been seen by neurosurgery in the past and surgeries not felt to be indicated. She now presents with increased left-sided weakness over the past 2 days and followup CT scan again shows chronic subdural hematoma with significant mass effect and midline shift. Patient underwent right frontoparietal craniotomy evacuation of subdural hematoma 07/23/2012 per Dr. Newell Coral. Placed on Keppra for seizure prophylaxis More lethargy this am noted by SLP.  Drinking some thickened liquids but not solid intake No new issues. No wheezing this am. A 12 point review of systems has been performed and if not noted above is otherwise negative.   Objective: Vital Signs: Blood pressure 181/75, pulse 85, temperature 98.3 F (36.8 C), temperature source Oral, resp. rate 17, height 5\' 2"  (1.575 m), weight 70.3 kg (154 lb 15.7 oz), SpO2 98.00%. Ct Head Wo Contrast  08/06/2012  *RADIOLOGY REPORT*  Clinical Data: History of subdural hematoma.  Confusion.  CT HEAD WITHOUT CONTRAST  Technique:  Contiguous axial images were obtained from the base of the skull through the vertex without contrast.  Comparison: 07/30/2012  Findings: Mixed density subdural fluid along the convexity on the right now measures 21 mm in thickness as opposed 6 mm previously. There is increased mass effect with the right to left midline shift of 14 mm as opposed to 6 mm previously.  The left lateral ventricle is slightly more full suggesting that there may be some ventricular trapping.  Low density in the periventricular white matter on the left is more pronounced,  possibly due to subependymal resorption. No posterior fossa pathology is seen.  IMPRESSION: Increased accumulation of mixed density but primarily low density fluid in the subdural space along the convexity on the right, maximal thickness now 21 mm.  Increase in mass effect with right-to- left shift of 14 mm, previously 6 mm.  Possible trapping of the left lateral ventricle.  This report to will be called to the clinical service but the P R A.   Original Report Authenticated By: Paulina Fusi, M.D.    No results found for this basename: WBC, HGB, HCT, PLT,  in the last 72 hours  Recent Labs  08/03/12 1135  NA 141  K 3.4*  CL 107  GLUCOSE 173*  BUN 19  CREATININE 2.19*  CALCIUM 8.6   CBG (last 3)   Recent Labs  08/05/12 1648 08/05/12 2102 08/06/12 0713  GLUCAP 88 162* 148*    Wt Readings from Last 3 Encounters:  08/01/12 70.3 kg (154 lb 15.7 oz)  07/23/12 68.6 kg (151 lb 3.8 oz)  07/23/12 68.6 kg (151 lb 3.8 oz)    Physical Exam:  HENT: oral mucosa generally moist.   craniotomy site with no drainage. Staples out  Eyes: poor tracking to R side, pupils reactive Neck: Neck supple. No thyromegaly present.  Cardiovascular: Regular rhythm. No Murmurs, gallops  Pulmonary/Chest: Effort normal and breath sounds normal. She has no wheezes.  Abdominal: Soft. Bowel sounds are normal. She exhibits no distension. NT Neurological: She   alert. Knows that she's in the hospital. She did follow simple commands. She opens eyes to command,leans forward to command. Decreased coordination with fine motor movements of the  left arm, leg. Mild left facial droop. Left arm is grossly 2-/5. Left leg is 3-. Sensation cannot assess due to lethargy. Speech is Mildly dysarthric .   Assessment/Plan: 1. Functional deficits secondary to acute on chronic right frontal-parietal SDH which require 3+ hours per day of interdisciplinary therapy in a comprehensive inpatient rehab setting. Physiatrist is providing  close team supervision and 24 hour management of active medical problems listed below. Physiatrist and rehab team continue to assess barriers to discharge/monitor patient progress toward functional and medical goals. FIM: FIM - Bathing Bathing Steps Patient Completed: Chest;Right Arm;Left Arm;Abdomen;Front perineal area;Buttocks;Right upper leg;Left upper leg;Right lower leg (including foot);Left lower leg (including foot) Bathing: 4: Steadying assist  FIM - Upper Body Dressing/Undressing Upper body dressing/undressing steps patient completed: Put head through opening of pull over shirt/dress;Thread/unthread left sleeve of pullover shirt/dress;Thread/unthread right sleeve of pullover shirt/dresss;Pull shirt over trunk Upper body dressing/undressing: 5: Set-up assist to: Obtain clothing/put away FIM - Lower Body Dressing/Undressing Lower body dressing/undressing steps patient completed: Thread/unthread right pants leg;Thread/unthread left pants leg;Pull pants up/down;Don/Doff right shoe;Don/Doff left shoe;Fasten/unfasten right shoe;Fasten/unfasten left shoe Lower body dressing/undressing: 4: Steadying Assist  FIM - Toileting Toileting steps completed by patient: Adjust clothing prior to toileting;Performs perineal hygiene;Adjust clothing after toileting Toileting Assistive Devices: Grab bar or rail for support Toileting: 4: Steadying assist  FIM - Diplomatic Services operational officer Devices: Grab bars Toilet Transfers: 3-To toilet/BSC: Mod A (lift or lower assist)  FIM - Banker Devices: Therapist, occupational: 3: Bed > Chair or W/C: Mod A (lift or lower assist);3: Supine > Sit: Mod A (lifting assist/Pt. 50-74%/lift 2 legs  FIM - Locomotion: Wheelchair Distance: 50 Locomotion: Wheelchair: 2: Travels 50 - 149 ft with moderate assistance (Pt: 50 - 74%) FIM - Locomotion: Ambulation Locomotion: Ambulation Assistive Devices: Dealer Ambulation/Gait Assistance: 4: Min assist Locomotion: Ambulation: 5: Travels 150 ft or more with supervision/safety issues  Comprehension Comprehension Mode: Auditory Comprehension: 2-Understands basic 25 - 49% of the time/requires cueing 51 - 75% of the time  Expression Expression Mode: Verbal Expression: 2-Expresses basic 25 - 49% of the time/requires cueing 50 - 75% of the time. Uses single words/gestures.  Social Interaction Social Interaction Mode: Asleep Social Interaction: 2-Interacts appropriately 25 - 49% of time - Needs frequent redirection.  Problem Solving Problem Solving Mode: Asleep Problem Solving: 2-Solves basic 25 - 49% of the time - needs direction more than half the time to initiate, plan or complete simple activities  Memory Memory Mode: Asleep Memory: 1-Recognizes or recalls less than 25% of the time/requires cueing greater than 75% of the time  Medical Problem List and Plan:  1. Acute on chronic right SDH. Status post right frontal parietal craniotomy 07/23/2012.  followup cranial CT scan 08/06/2012 shows increase in size of right sided subdural collection. declined neurologically with increased L HP and increased lethargy. Start IVF  -follow up Neurosurgery today, Dr Newell Coral contacted Will not be stable for D/C in am 2. DVT Prophylaxis/Anticoagulation: SCDs. Monitor for any signs of DVT  3. Pain Management: Tylenol as needed  4. Neuropsych: This patient is not capable of making decisions on his/her own behalf.  5. Seizure prophylaxis. Keppra 500 mg twice a day. Monitor for any seizure activity  6. Diabetes mellitus peripheral neuropathy. Hemoglobin A1c of 12.3. Tradjenta 5 mg daily. Check blood sugars a.c. and at bedtime  -sugars remain under control at present. 7. Hypertension. Coreg 25 mg twice a day, clonidine patch change  every 7 days and Avapro 300 mg, norvasc 10 daily.   -bp's still elevated. Hold off on med changes as pt may be heading to  OR    LOS (Days) 7 A FACE TO FACE EVALUATION WAS PERFORMED  KIRSTEINS,ANDREW E 08/06/2012 9:05 AM

## 2012-08-06 NOTE — H&P (Signed)
Subjective: Patient is a 79 y.o. female who is admitted in transfer from the [facility] inpatient rehabilitation center for treatment of recurrent chronic subdural hematoma. Patient is 2 weeks status post craniotomy for evacuation of a subacute and chronic subdural hematoma. She recovered well following surgery and and was transferred to the [facility] inpatient rehabilitation center. She continued to make progress, but over the past weekend became less responsive. CT scan was repeated this morning and revealed significant recurrence of chronic subdural hematoma, with significant mass effect and midline shift. The patient was transferred back to my service and the neurosurgical intensive care unit and is readmitted for further neurosurgical care.  Patient Active Problem List   Diagnosis Date Noted  . Subdural hematoma 07/30/2012  . DVT (deep venous thrombosis) 07/04/2012  . CKD (chronic kidney disease) stage 3, GFR 30-59 ml/min 07/04/2012  . Lower extremity edema 07/03/2012  . Anemia 07/03/2012  . ARF (acute renal failure) 07/03/2012  . HTN (hypertension) 07/03/2012  . SDH (subdural hematoma) 06/21/2012  . Encephalopathy acute 06/20/2012  . Community acquired pneumonia 06/20/2012  . Diabetes mellitus 06/20/2012  . Hyperlipidemia 06/20/2012  . Hypertension 06/20/2012   Past Medical History  Diagnosis Date  . Diabetes mellitus   . Hypertension   . Hyperlipidemia   . Pneumonia   . Subdural hematoma   . Arthritis   . DVT (deep venous thrombosis)   . Incontinence   . Renal disorder     chronic kidney dz stage III    Past Surgical History  Procedure Laterality Date  . No past surgeries    . Insertion of vena cava filter    . Craniotomy Right 07/23/2012    Procedure: CRANIOTOMY HEMATOMA EVACUATION SUBDURAL;  Surgeon: [name], MD;  Location: [facility];  Service: Neurosurgery;  Laterality: Right;    Prescriptions prior to admission  Medication Sig Dispense Refill  .  amLODipine (NORVASC) 10 MG tablet Take 1 tablet (10 mg total) by mouth daily.  30 tablet  1  . atorvastatin (LIPITOR) 40 MG tablet Take 40 mg by mouth every evening.      . carvedilol (COREG) 25 MG tablet Take 1 tablet (25 mg total) by mouth 2 (two) times daily with a meal.  60 tablet  3  . cloNIDine (CATAPRES - DOSED IN MG/24 HR) 0.3 mg/24hr Place 1 patch (0.3 mg total) onto the skin once a week.  4 patch  2  . insulin glargine (LANTUS) 100 UNIT/ML injection Inject 20 Units into the skin daily.      . sitaGLIPtin (JANUVIA) 50 MG tablet Take 1 tablet (50 mg total) by mouth daily.  30 tablet  1  . telmisartan (MICARDIS) 80 MG tablet Take 1 tablet (80 mg total) by mouth daily.  30 tablet  3   No Known Allergies  History  Substance Use Topics  . Smoking status: Never Smoker   . Smokeless tobacco: Never Used  . Alcohol Use: No    Family History  Problem Relation Age of Onset  . Diabetes Mellitus II Son      Review of Systems A comprehensive review of systems was negative.  Objective: Vital signs in last 24 hours: Temp:  [96.5 F (35.8 C)-98.3 F (36.8 C)] 97.1 F (36.2 C) (03/03 1200) Pulse Rate:  [65-85] 69 (03/03 1031) Resp:  [16-18] 18 (03/03 1200) BP: (147-193)/(66-80) 147/66 mmHg (03/03 1200) SpO2:  [98 %-100 %] 100 % (03/03 1200) Weight:  [62 kg (136 lb 11  oz)] 62 kg (136 lb 11 oz) (03/03 1200)  EXAM: Patient is a elderly black female in no acute distress. Lungs are clear to auscultation , the patient has symmetrical respiratory excursion. Heart has a regular rate and rhythm normal S1 and S2 no murmur.   Abdomen is soft nontender nondistended bowel sounds are present. Extremity examination shows no clubbing cyanosis or edema. Mental status patient is drowsy but easily aroused. She follows commands. Pupils are equal round and reactive to light, about 2.5 mm bilaterally. EOMI. Moderate left hemiparesis involving  The left lower extremity more so than the left upper extremity.  Sensation is intact to pinprick throughout.  Data Review:CBC    Component Value Date/Time   WBC 6.1 08/06/2012 0856   RBC 3.49* 08/06/2012 0856   HGB 9.2* 08/06/2012 0856   HCT 27.1* 08/06/2012 0856   PLT 368 08/06/2012 0856   MCV 77.7* 08/06/2012 0856   MCH 26.4 08/06/2012 0856   MCHC 33.9 08/06/2012 0856   RDW 15.1 08/06/2012 0856   LYMPHSABS 1.4 08/06/2012 0856   MONOABS 0.5 08/06/2012 0856   EOSABS 0.1 08/06/2012 0856   BASOSABS 0.0 08/06/2012 0856                          BMET    Component Value Date/Time   NA 142 08/06/2012 0856   K 3.0* 08/06/2012 0856   CL 105 08/06/2012 0856   CO2 24 08/06/2012 0856   GLUCOSE 193* 08/06/2012 0856   BUN 19 08/06/2012 0856   CREATININE 2.09* 08/06/2012 0856   CALCIUM 9.0 08/06/2012 0856   GFRNONAA 20* 08/06/2012 0856   GFRAA 23* 08/06/2012 0856     Assessment/Plan: Patient with significant recurrent chronic subdural hematoma, with significant mass effect and shift, with declining neurologic responsiveness. I've recommended returning the patient to the operating room for craniectomy for evacuation of subdural hematoma and implantation of the bone flap into the abdominal wall. I've discussed the patient's condition and recommendations for surgery with her son Elisheva Fallas [phone]). I discussed her neurologic condition, her CT scan findings, at my recommendation for surgery with him. We discussed risks without surgery including continued neurologic decline including decreasing responsiveness, increasing paralysis, and eventual death versus the risks of surgical intervention including risk of infection, bleeding, possibly for transfusion, the risk of neurologic dysfunction including paralysis, coma, and death, and anesthetic risks of myocardial infarction, stroke, pneumonia, and death. After discussing this he does want [place] to go ahead with surgery and will be coming down to the hospital in about an hour, and will sign the consent in person with the nursing staff. Preoperative orders  have been placed and the case has been posted.   [name], MD 08/06/2012 2:11 PM

## 2012-08-06 NOTE — Anesthesia Procedure Notes (Addendum)
Procedure Name: Intubation Date/Time: 08/06/2012 8:20 AM Performed by: Angelica Pou Pre-anesthesia Checklist: Patient identified, Timeout performed, Emergency Drugs available, Suction available and Patient being monitored Patient Re-evaluated:Patient Re-evaluated prior to inductionOxygen Delivery Method: Circle system utilized Preoxygenation: Pre-oxygenation with 100% oxygen Ventilation: Mask ventilation without difficulty Grade View: Grade II Tube type: Subglottic suction tube Tube size: 7.5 mm Number of attempts: 3 Airway Equipment and Method: Rigid stylet,  Video-laryngoscopy and Oral airway Placement Confirmation: ETT inserted through vocal cords under direct vision,  breath sounds checked- equal and bilateral and CO2 detector Secured at: 22 cm Tube secured with: Tape Dental Injury: Bloody posterior oropharynx  Comments: Pt decompensated in PACU, HR <40, SpO2 <80.  This CRNA, Dr Newell Coral and Dr Ivin Booty at bedside. OPA in, easy MV to 100%SpO2.  VL x3 by MDA, difficulty navigating around swollen tongue.  Glottis appeared swollen with cystic pustules noted on arytenoids.  ETT passed through VC, BS=B, +etCO2 via CO2 detector.  RT at bedside to place pt on ventilator.

## 2012-08-06 NOTE — Discharge Summary (Signed)
NAMETOINI, FAILLA NO.:  [id number]  MEDICAL RECORD NO.:  [id number]  LOCATION:  4028                         FACILITY:  [facility]  PHYSICIAN:  Ranelle Oyster, M.D.DATE OF BIRTH:  [birth year]  DATE OF ADMISSION:  07/30/2012 DATE OF DISCHARGE:  08/06/2012                              DISCHARGE SUMMARY   DISCHARGE DIAGNOSES: 1. Acute on chronic right frontoparietal subdural hematoma with     increased fluid collection. 2. Sequential compression devices for DVT prophylaxis. 3. Seizure prophylaxis. 4. Diabetes mellitus. 5. Peripheral neuropathy. 6. Hypertension.  HISTORY OF PRESENT ILLNESS:  An 79 year old right-handed female with history of chronic renal insufficiency, 1.8-2.10 as well as DVT with IVC filter placed, admitted July 23, 2012 with noted history of large right hemispheric chronic subdural hematoma.  She has been seen by neurosurgery in the past and surgery not felt to be indicated at this time.  She now presented with increased left-sided weakness over the past 2 days and follow up scan showed chronic subdural hematoma with significant mass effect and midline shift.  Underwent right frontoparietal craniotomy evacuation of subdural hematoma on July 23, 2012 per Dr. Newell Coral.  Placed on Keppra for seizure prophylaxis. Latest follow up of cranial CT scan on July 30, 2012 showed increase in size of right-sided subdural collection as well as slight increase in size of blood collection.  Neurosurgery reviewed the film neurologically at that time.  The patient felt to be stable and was admitted for a comprehensive rehab program.  PAST MEDICAL HISTORY:  See discharge diagnoses.  SOCIAL HISTORY:  Lives with son.  Functional history, prior to admission was retired.  No driving.  Functional status upon admission to rehab services was moderate assist, ambulated 90 feet with one person handheld assistance.  REHABILITATION HOSPITAL COURSE:   The patient was admitted to inpatient rehab services with therapies initiated on a 3-hour daily basis consisting of physical therapy, occupational therapy, speech therapy, and rehabilitation nursing.  The following issues were addressed during the patient's rehabilitation stay.  Pertaining to this patient's acute on chronic right subdural hematoma, she had undergone right frontoparietal craniotomy on July 23, 2012, and followed closely by neurosurgery with latest scan as noted showing some increase in size of right-sided subdural collection, however, she remained neurologically status with ongoing therapies.  The plan was for repeat scan on August 06, 2012 at the recommendations of neurosurgery which was completed that showed increased accumulation of mixed density, but primarily low- density fluid in the subdural space along the convexity on the right, maximal thickness now 21 mm.  Increase in mass effect with right-to-left shift of 14 mm, previously 6 mm.  The patient has not become more lethargic with some difficulty in attending therapies, neurosurgery was well aware of these changes and plan was to be discharged to acute care services for surgical intervention per neurosurgery.  The patient's son was contacted prior to the patient's discharge from rehab services.  Condition medically guarded at the time of discharge.  All medication changes would be made at the recommendation of neurosurgery.     Mariam Dollar, P.A.   ______________________________ Ranelle Oyster, M.D.    DA/MEDQ  D:  08/06/2012  T:  08/06/2012  Job:  [id number]  cc:   [name], M.D.

## 2012-08-06 NOTE — Progress Notes (Signed)
Subjective: Patient initially did well following extubation in the OR, and was transferred to neurosurgery ICU for further recovery. Patient was moving all 4 extremities, right better than left. In the ICU the patient subsequently developed O2 desaturation, hypoventilation, and decreased responsiveness. O2 saturation improved with bagging with a mask; anesthesia was called and reevaluated the patient. Overall assessment was that the patient was not adequately ventilating. And therefore Dr. Sheldon Silvan reintubated the patient using a glidescope. He described swelling and edema of upper airway tissues. Patient was placed on a ventilator. Oxygen saturation stabilized following reintubation. Chest x-ray has been requested.  Objective: Vital signs in last 24 hours: Filed Vitals:   08/06/12 1556 08/06/12 1600 08/06/12 1950 08/06/12 2030  BP:  158/107  143/61  Pulse:  67 52 53  Temp: 97.6 F (36.4 C)     TempSrc: Oral     Resp:  17  19  Height:      Weight:      SpO2:  99% 95% 100%    Intake/Output from previous day:   Intake/Output this shift: Total I/O In: 500 [I.V.:500] Out: -   Physical Exam:  Patient's pupils are small throughout. Small amount of bloody CSF drainage into Jackson-Pratt drains. Patient moving all 4 extremities, right greater then left, when 02 saturation better.  CBC  Recent Labs  08/06/12 0856 08/06/12 1540  WBC 6.1 6.9  HGB 9.2* 9.2*  HCT 27.1* 27.5*  PLT 368 369   BMET  Recent Labs  08/06/12 0856 08/06/12 1540  NA 142 140  K 3.0* 3.2*  CL 105 105  CO2 24 25  GLUCOSE 193* 150*  BUN 19 19  CREATININE 2.09* 2.02*  CALCIUM 9.0 8.9    Studies/Results: Ct Head Wo Contrast  08/06/2012  *RADIOLOGY REPORT*  Clinical Data: History of subdural hematoma.  Confusion.  CT HEAD WITHOUT CONTRAST  Technique:  Contiguous axial images were obtained from the base of the skull through the vertex without contrast.  Comparison: 07/30/2012  Findings: Mixed density  subdural fluid along the convexity on the right now measures 21 mm in thickness as opposed 6 mm previously. There is increased mass effect with the right to left midline shift of 14 mm as opposed to 6 mm previously.  The left lateral ventricle is slightly more full suggesting that there may be some ventricular trapping.  Low density in the periventricular white matter on the left is more pronounced, possibly due to subependymal resorption. No posterior fossa pathology is seen.  IMPRESSION: Increased accumulation of mixed density but primarily low density fluid in the subdural space along the convexity on the right, maximal thickness now 21 mm.  Increase in mass effect with right-to- left shift of 14 mm, previously 6 mm.  Possible trapping of the left lateral ventricle.  This report to will be called to the clinical service but the P R A.   Original Report Authenticated By: Paulina Fusi, M.D.     Assessment/Plan: Patient unable to maintain adequate airway probably due to a combination of altered mental status and upper airway swelling and edema. Stable following reintubation.  I've spoken with Dr. Vassie Loll from [facility] by phone, and have requested a CCM consultation for assistance with ventilator management, hemodynamic management, and overall critical care management.  Have spoken with the patient's son initially following surgery, but spoken to him again following his mother's reintubation. Plans for treatment and care explained. His questions were answered for him.   [name], MD 08/06/2012, 8:35  PM

## 2012-08-06 NOTE — Transfer of Care (Signed)
Immediate Anesthesia Transfer of Care Note  Patient: April [name]  Procedure(s) Performed: Procedure(s) with comments: CRANIOTOMY HEMATOMA EVACUATION SUBDURAL (N/A) - Craniectomy for evacuation of subdural hematoma, implantation of bone flap in abdominal wall   Patient Location: PACU  Anesthesia Type:General  Level of Consciousness: sedated, pateint uncooperative, confused and responds to stimulation  Airway & Oxygen Therapy: Patient Spontanous Breathing and Patient connected to face mask oxygen with OPA in place.  Post-op Assessment: Report given to PACU RN, Post -op Vital signs reviewed and unstable, Anesthesiologist notified and Patient moving all extremities X 4  Post vital signs: Reviewed and unstable  Complications: Patient re-intubated

## 2012-08-06 NOTE — Progress Notes (Signed)
Pt transported to room 3101 via bed.

## 2012-08-06 NOTE — Discharge Summary (Signed)
  Discharge summary job # [id number]

## 2012-08-06 NOTE — Anesthesia Postprocedure Evaluation (Signed)
  Anesthesia Post-op Note  Patient: April [name]  Procedure(s) Performed: Procedure(s) with comments: CRANIOTOMY HEMATOMA EVACUATION SUBDURAL (N/A) - Craniectomy for evacuation of subdural hematoma, implantation of bone flap in abdominal wall   Patient Location: PACU  Anesthesia Type:General  Level of Consciousness: sedated and lethargic  Airway and Oxygen Therapy: Patient re-intubated and Patient placed on Ventilator (see vital sign flow sheet for setting)  Post-op Pain: none  Post-op Assessment: Post-op Vital signs reviewed  Post-op Vital Signs: Reviewed  Complications: Patient re-intubated and on video laryngoscopy the arytenoids were seen to be very swollen with almost a bilateral cyst like appearance. Vocal cords were seen and the ETT was passed with BBS and + C2 on color indicator.

## 2012-08-06 NOTE — Progress Notes (Signed)
Occupational Therapy Session Note  Patient Details  Name: April [name] MRN: [id number] Date of Birth: 1921-06-09  Today's Date: 08/06/2012 Time: 0800-0900 Time Calculation (min): 60 min  Short Term Goals: Week 1:  OT Short Term Goal 1 (Week 1): STG=LTG; overall supervision  Skilled Therapeutic Interventions/Progress Updates:    Pt asleep in bed upon arrival.  Pt required max verbal cues for arousal and min A with tactile and verbal cues to sit EOB in preparation for toileting and shower.  Pt incontinent of bowel and bladder.  Pt required mod A for sit<>stand and amb with RW to BR.  Pt exhibited difficulty keeping eyes open and responding to questions/commands during bathing and dressing tasks.  Pt required mod A for bathing tasks and tot A for dressing tasks secondary to time limitations.  Pt required increased time to initiate all tasks when requested.  Pt verbalized understanding but would not initiate tasks and required physical prompts to initiate.  Focus on arousal, task initiation, transfers, and safety awareness. Therapy Documentation Precautions:  Precautions Precautions: Fall Precaution Comments: Decreased safety awareness, decreased awareness of deficits Restrictions Weight Bearing Restrictions: No  See FIM for current functional status  Therapy/Group: Individual Therapy  Rich Brave 08/06/2012, 9:13 AM

## 2012-08-07 ENCOUNTER — Inpatient Hospital Stay (HOSPITAL_COMMUNITY): Payer: Medicare Other

## 2012-08-07 ENCOUNTER — Encounter (HOSPITAL_COMMUNITY): Payer: Self-pay | Admitting: Radiology

## 2012-08-07 LAB — BASIC METABOLIC PANEL
BUN: 23 mg/dL (ref 6–23)
CO2: 24 mEq/L (ref 19–32)
Calcium: 8.1 mg/dL — ABNORMAL LOW (ref 8.4–10.5)
Chloride: 111 mEq/L (ref 96–112)
Creatinine, Ser: 2.31 mg/dL — ABNORMAL HIGH (ref 0.50–1.10)
GFR calc Af Amer: 21 mL/min — ABNORMAL LOW (ref >90)
GFR calc non Af Amer: 18 mL/min — ABNORMAL LOW (ref >90)
Glucose, Bld: 141 mg/dL — ABNORMAL HIGH (ref 70–99)
Potassium: 3.5 mEq/L (ref 3.5–5.1)
Sodium: 143 mEq/L (ref 135–145)

## 2012-08-07 LAB — CBC
HCT: 22 % — ABNORMAL LOW (ref 36.0–46.0)
Hemoglobin: 7.3 g/dL — ABNORMAL LOW (ref 12.0–15.0)
MCH: 26 pg (ref 26.0–34.0)
MCHC: 33.2 g/dL (ref 30.0–36.0)
MCV: 78.3 fL (ref 78.0–100.0)
Platelets: 313 10*3/uL (ref 150–400)
RBC: 2.81 MIL/uL — ABNORMAL LOW (ref 3.87–5.11)
RDW: 15.1 % (ref 11.5–15.5)
WBC: 11.5 10*3/uL — ABNORMAL HIGH (ref 4.0–10.5)

## 2012-08-07 LAB — GLUCOSE, CAPILLARY
Glucose-Capillary: 109 mg/dL — ABNORMAL HIGH (ref 70–99)
Glucose-Capillary: 124 mg/dL — ABNORMAL HIGH (ref 70–99)
Glucose-Capillary: 134 mg/dL — ABNORMAL HIGH (ref 70–99)
Glucose-Capillary: 185 mg/dL — ABNORMAL HIGH (ref 70–99)
Glucose-Capillary: 89 mg/dL (ref 70–99)
Glucose-Capillary: 96 mg/dL (ref 70–99)

## 2012-08-07 MED ORDER — PRO-STAT SUGAR FREE PO LIQD
60.0000 mL | Freq: Three times a day (TID) | ORAL | Status: DC
  Administered 2012-08-07 – 2012-08-08 (×2): 60 mL
  Filled 2012-08-07 (×7): qty 60

## 2012-08-07 MED ORDER — JEVITY 1.2 CAL PO LIQD
1000.0000 mL | ORAL | Status: DC
  Administered 2012-08-07: 1000 mL
  Filled 2012-08-07 (×3): qty 1000

## 2012-08-07 MED ORDER — PRO-STAT SUGAR FREE PO LIQD
30.0000 mL | Freq: Every day | ORAL | Status: DC
  Administered 2012-08-07: 30 mL
  Filled 2012-08-07 (×4): qty 30

## 2012-08-07 MED ORDER — PROPOFOL 10 MG/ML IV EMUL
5.0000 ug/kg/min | INTRAVENOUS | Status: DC
  Administered 2012-08-07: 5 ug/kg/min via INTRAVENOUS
  Administered 2012-08-08: 20 ug/kg/min via INTRAVENOUS
  Filled 2012-08-07 (×2): qty 100

## 2012-08-07 MED ORDER — JEVITY 1.2 CAL PO LIQD
1000.0000 mL | ORAL | Status: DC
  Filled 2012-08-07 (×2): qty 1000

## 2012-08-07 NOTE — Progress Notes (Signed)
PULMONARY  / CRITICAL CARE MEDICINE  Name: April [name] MRN: [id number] DOB: Apr 17, 1922    ADMISSION DATE:  08/06/2012 CONSULTATION DATE:  08/06/2012  REFERRING MD :  Neurosurgery Newell Coral)  CHIEF COMPLAINT:  Acute respiratory failure.  BRIEF PATIENT DESCRIPTION:  79 yo with recurrent R chronic subdural hematoma s/p evacuation, left hemiparesis and acute encephalopathy.  SIGNIFICANT EVENTS / STUDIES:  3/3  Admitted with acute encephalopathy.  Head CT >>> Recurrence of R chronic subdural hematoma with significant mass effect and midline shift.  OR >>> evacuation of hematoma.  LINES / TUBES: OETT  3/3 >>> OGT  3/3 >>> Foley  3/3 >>>  CULTURES:  ANTIBIOTICS: Ceftriaxone  3/3 x1   INTERVAL HISTORY:   08/07/2012: Agitated on wake of assessment while on when necessary fentanyl. Does not open eyes spontaneously  VITAL SIGNS: Temp:  [96.1 F (35.6 C)-97.7 F (36.5 C)] 97.7 F (36.5 C) (03/04 0731) Pulse Rate:  [51-76] 64 (03/04 0900) Resp:  [14-23] 19 (03/04 0900) BP: (128-197)/(50-107) 143/53 mmHg (03/04 0900) SpO2:  [95 %-100 %] 100 % (03/04 0900) FiO2 (%):  [30 %-100 %] 30 % (03/04 0858) Weight:  [62 kg (136 lb 11 oz)-67.4 kg (148 lb 9.4 oz)] 67.4 kg (148 lb 9.4 oz) (03/04 0500) HEMODYNAMICS:   VENTILATOR SETTINGS: Vent Mode:  [-] PRVC FiO2 (%):  [30 %-100 %] 30 % Set Rate:  [15 bmp-18 bmp] 15 bmp Vt Set:  [400 mL-500 mL] 400 mL PEEP:  [5 cmH20] 5 cmH20 Plateau Pressure:  [16 cmH20-22 cmH20] 16 cmH20 INTAKE / OUTPUT: Intake/Output     03/03 0701 - 03/04 0700 03/04 0701 - 03/05 0700   I.V. (mL/kg) 3300 (49) 100 (1.5)   IV Piggyback 105    Total Intake(mL/kg) 3405 (50.5) 100 (1.5)   Urine (mL/kg/hr) 575 115 (0.5)   Drains 100 70 (0.3)   Blood 100    Total Output 775 185   Net +2630 -85        Urine Occurrence 2 x     PHYSICAL EXAMINATION: General:  Appears comfortable, mechanically ventilated, synchronous Neuro:  Encephalopathic, cough / gag present. Does not  open eyes spontaneously. Moving all fours restlessly  HEENT:  PERRL, OETT / OGT Cardiovascular:  RRR, no m/r/g Lungs:  CTAB Abdomen:  Soft, nontender, bowel sounds diminished Musculoskeletal:  Residual left hemiparesis Skin:  Intact  LABS:  Recent Labs Lab 08/06/12 0856 08/06/12 1540 08/06/12 2100 08/07/12 0300  HGB 9.2* 9.2*  --  7.3*  WBC 6.1 6.9  --  11.5*  PLT 368 369  --  313  NA 142 140  --  143  K 3.0* 3.2*  --  3.5  CL 105 105  --  111  CO2 24 25  --  24  GLUCOSE 193* 150*  --  141*  BUN 19 19  --  23  CREATININE 2.09* 2.02*  --  2.31*  CALCIUM 9.0 8.9  --  8.1*  PHART  --   --  7.337*  --   PCO2ART  --   --  42.8  --   PO2ART  --   --  120.0*  --     Recent Labs Lab 08/06/12 1648 08/06/12 2000 08/07/12 0020 08/07/12 0417 08/07/12 0751  GLUCAP 130* 163* 185* 134* 89    Ct Head Wo Contrast  08/07/2012  *RADIOLOGY REPORT*  Clinical Data: Post craniotomy for subdural hematoma.  On ventilator.  CT HEAD WITHOUT CONTRAST  Technique:  Contiguous axial images  were obtained from the base of the skull through the vertex without contrast.  Comparison: Most recent examination 08/06/2012.  Findings: Interval placement of to right sided subdural drains with a small amount of pneumocephalus and decrease in the size of right- sided subdural collection now with maximal thickness of 10.5 mm versus prior 21 mm.  Small amount of acute blood is seen within the residual subdural collection.  Decrease in degree of mass effect and midline shift to the left now 3.7 mm versus prior 14.1 mm.  Left lateral ventricle trapping appears slightly less prominent.  Remote left with parietal lobe infarct without CT evidence of large acute infarct.  No intracranial mass lesion detected on this unenhanced exam.  Vascular calcifications.  IMPRESSION: Interval placement of two right-sided subdural drains and decrease in size of right-sided subdural hematoma and mass effect as detailed above.  Results called  to floor 08/07/2012 5:58 a.m.by Dr. Cherly Hensen.   Original Report Authenticated By: Lacy Duverney, M.D.    Ct Head Wo Contrast  08/06/2012  *RADIOLOGY REPORT*  Clinical Data: History of subdural hematoma.  Confusion.  CT HEAD WITHOUT CONTRAST  Technique:  Contiguous axial images were obtained from the base of the skull through the vertex without contrast.  Comparison: 07/30/2012  Findings: Mixed density subdural fluid along the convexity on the right now measures 21 mm in thickness as opposed 6 mm previously. There is increased mass effect with the right to left midline shift of 14 mm as opposed to 6 mm previously.  The left lateral ventricle is slightly more full suggesting that there may be some ventricular trapping.  Low density in the periventricular white matter on the left is more pronounced, possibly due to subependymal resorption. No posterior fossa pathology is seen.  IMPRESSION: Increased accumulation of mixed density but primarily low density fluid in the subdural space along the convexity on the right, maximal thickness now 21 mm.  Increase in mass effect with right-to- left shift of 14 mm, previously 6 mm.  Possible trapping of the left lateral ventricle.  This report to will be called to the clinical service but the P R A.   Original Report Authenticated By: Paulina Fusi, M.D.    Dg Chest Port 1 View  08/06/2012  *RADIOLOGY REPORT*  Clinical Data: Endotracheal tube placement  PORTABLE CHEST - 1 VIEW  Comparison: 07/02/2012  Findings: 2100 hours.  Endotracheal tube tip is 3.9 cm above the base of the carina.  Lung volumes are low. The cardiopericardial silhouette is enlarged.  There is bibasilar collapse / consolidation. Telemetry leads overlie the chest.  IMPRESSION: Endotracheal tube tip is 3.9 cm above the base of the carina.  Cardiomegaly with low volumes and bibasilar collapse / consolidation.   Original Report Authenticated By: Kennith Center, M.D.    Dg Abd Portable 1v  08/07/2012  *RADIOLOGY  REPORT*  Clinical Data: Orogastric tube placement.  PORTABLE ABDOMEN - 1 VIEW  Comparison: 07/02/2012  Findings: Nasogastric tube/orogastric tube tip projects at the level of the right upper quadrant pain at the expected level of the gastric antrum.  Radiopaque material overlies the right aspect of the pelvis.  It is possible this represents contrast within bowel.  Exact etiology indeterminate.  Contrast within the colonic diverticula.  Inferior vena cava filter in place.  Degenerative changes lumbar spine.  Medial left base atelectatic changes.  IMPRESSION: Nasogastric tube/orogastric tube tip projects at the level of the right upper quadrant pain at the expected level of the gastric antrum.  Nonspecific radiopaque structure right aspect of the abdomen possibly contrast within bowel.  Medial left base subsegmental atelectasis.   Original Report Authenticated By: Lacy Duverney, M.D.      ASSESSMENT / PLAN:  PULMONARY  Recent Labs Lab 08/06/12 2100  PHART 7.337*  PCO2ART 42.8  PO2ART 120.0*  HCO3 22.3  TCO2 23.6  O2SAT 99.3    A:  Acute postop respiratory failure.  Bilateral atelectasis.  History of extubation failure secondary to ? cord edema. - 08/07/2012: Does not meet extubation criteria due to her constant agitation P:   Full mechanical support Daily SBT Trend ABG / CXR In future if there is no cuff leak test positivity, then consider a short course steroids  CARDIOVASCULAR  No results found for this basename: TROPONINI,  in the last 168 hours No results found for this basename: PROBNP,  in the last 168 hours  A: DVT s/p IVC filter placement. HTN.  Bradycardia, likely secondary to Coreg / Clonidine - 08/07/2012: Heart rate 60 sinus bradycardia with normal blood pressure  PLAN Goal MAP per Neurosurgery < 170 (starting propofol 08/07/2012] Hold Clonidine / Coreg Continue Norvasc, Micardis (caution with renal failure) Hydralazine PRN  RENAL  Recent Labs Lab 08/03/12 1135  08/06/12 0856 08/06/12 1540 08/07/12 0300  NA 141 142 140 143  K 3.4* 3.0* 3.2* 3.5  CL 107 105 105 111  CO2 25 24 25 24   GLUCOSE 173* 193* 150* 141*  BUN 19 19 19 23   CREATININE 2.19* 2.09* 2.02* 2.31*  CALCIUM 8.6 9.0 8.9 8.1*    A:  CKD.  P:   Trend BMP Defer K supplementation NS @ 100  GASTROINTESTINAL No results found for this basename: AST, ALT, ALKPHOS, BILITOT, PROT, ALBUMIN, INR,  in the last 168 hours  A:  No active issues. P:   TF will call nutrition consult  Protonix for GI Px  HEMATOLOGIC  Recent Labs Lab 08/06/12 0856 08/06/12 1540 08/07/12 0300  HGB 9.2* 9.2* 7.3*  HCT 27.1* 27.5* 22.0*  WBC 6.1 6.9 11.5*  PLT 368 369 313    A:  Anemia, acute on chronic (renal disease and acute blood loss). P:  Trend CBC SCDs for DVT Px - PRBC for hgb </= 6.9gm%    - exceptions are   -  if ACS susepcted/confirmed then transfuse for hgb </= 8.0gm%,  or    -  If septic shock first 24h and scvo2 < 70% then transfuse for hgb </= 9.0gm%   - active bleeding with hemodynamic instability, then transfuse regardless of hemoglobin value   At at all times try to transfuse 1 unit prbc as possible with exception of active hemorrhage    INFECTIOUS No results found for this basename: LATICACIDVEN, PROCALCITON,  in the last 168 hours  Recent Labs Lab 08/06/12 0856 08/06/12 1540 08/07/12 0300  WBC 6.1 6.9 11.5*    A:  No evidence of acute infection. - 08/07/2012: No fever  P:   No intervention required  ENDOCRINE  A:  DM2, uncontrolled.  Hyperglycemia.  P:   SSI  NEUROLOGIC A:  Recurrent SDH s/p evacuation. - 08/07/2012: Restless and agitated P:   Stop propofol 08/07/2012 Per Neurosurgery Goal RASS 0 to -1 Morphine PRN    CRITICAL CARE:  The patient is critically ill with multiple organ systems failure and requires high complexity decision making for assessment and support, frequent evaluation and titration of therapies, application of advanced  monitoring technologies and extensive interpretation of multiple databases. Critical  Care Time devoted to patient care services described in this note is 35 minutes.     Dr. Kalman Shan, M.D., [facility].C.P Pulmonary and Critical Care Medicine Staff Physician [facility] [facility] Pager: [phone], If no answer or between  15:00h - 7:00h: call [phone]  08/07/2012 10:19 AM

## 2012-08-07 NOTE — Progress Notes (Signed)
Attempted to page Dr. Danielle Dess about patients new scan results.

## 2012-08-07 NOTE — Progress Notes (Signed)
Subjective: Patient resting comfortably in bed. Continues on ventilator via ETT. Moderate drainage into Jackson-Pratt drains, of thin reddish, chronic subdural hematoma looking fluid. CT scan brain without contrast this morning shows good evacuation of recurrent chronic subdural hematoma, with much decreased mass effect and much decreased shift.  Objective: Vital signs in last 24 hours: Filed Vitals:   08/07/12 0700 08/07/12 0715 08/07/12 0731 08/07/12 0800  BP: 157/63 197/75 177/56 145/61  Pulse: 56 57  68  Temp:   97.7 F (36.5 C)   TempSrc:   Axillary   Resp: 15 15  22   Height:      Weight:      SpO2: 100% 100%  100%    Intake/Output from previous day: 03/03 0701 - 03/04 0700 In: 3405 [I.V.:3300; IV Piggyback:105] Out: 775 [Urine:575; Drains:100; Blood:100] Intake/Output this shift:    Physical Exam:  Restless in bed. Moving all 4 extremities much better than yesterday, still moving right side better than left side. Pupils 3 mm, round and sluggishly reactive to light.  CBC  Recent Labs  08/06/12 1540 08/07/12 0300  WBC 6.9 11.5*  HGB 9.2* 7.3*  HCT 27.5* 22.0*  PLT 369 313   BMET  Recent Labs  08/06/12 1540 08/07/12 0300  NA 140 143  K 3.2* 3.5  CL 105 111  CO2 25 24  GLUCOSE 150* 141*  BUN 19 23  CREATININE 2.02* 2.31*  CALCIUM 8.9 8.1*   ABG    Component Value Date/Time   PHART 7.337* 08/06/2012 2100   PCO2ART 42.8 08/06/2012 2100   PO2ART 120.0* 08/06/2012 2100   HCO3 22.3 08/06/2012 2100   TCO2 23.6 08/06/2012 2100   ACIDBASEDEF 2.6* 08/06/2012 2100   O2SAT 99.3 08/06/2012 2100    Studies/Results: Ct Head Wo Contrast  08/07/2012  *RADIOLOGY REPORT*  Clinical Data: Post craniotomy for subdural hematoma.  On ventilator.  CT HEAD WITHOUT CONTRAST  Technique:  Contiguous axial images were obtained from the base of the skull through the vertex without contrast.  Comparison: Most recent examination 08/06/2012.  Findings: Interval placement of to right sided  subdural drains with a small amount of pneumocephalus and decrease in the size of right- sided subdural collection now with maximal thickness of 10.5 mm versus prior 21 mm.  Small amount of acute blood is seen within the residual subdural collection.  Decrease in degree of mass effect and midline shift to the left now 3.7 mm versus prior 14.1 mm.  Left lateral ventricle trapping appears slightly less prominent.  Remote left with parietal lobe infarct without CT evidence of large acute infarct.  No intracranial mass lesion detected on this unenhanced exam.  Vascular calcifications.  IMPRESSION: Interval placement of two right-sided subdural drains and decrease in size of right-sided subdural hematoma and mass effect as detailed above.  Results called to floor 08/07/2012 5:58 a.m.by Dr. Cherly Hensen.   Original Report Authenticated By: Lacy Duverney, M.D.    Ct Head Wo Contrast  08/06/2012  *RADIOLOGY REPORT*  Clinical Data: History of subdural hematoma.  Confusion.  CT HEAD WITHOUT CONTRAST  Technique:  Contiguous axial images were obtained from the base of the skull through the vertex without contrast.  Comparison: 07/30/2012  Findings: Mixed density subdural fluid along the convexity on the right now measures 21 mm in thickness as opposed 6 mm previously. There is increased mass effect with the right to left midline shift of 14 mm as opposed to 6 mm previously.  The left lateral ventricle is slightly  more full suggesting that there may be some ventricular trapping.  Low density in the periventricular white matter on the left is more pronounced, possibly due to subependymal resorption. No posterior fossa pathology is seen.  IMPRESSION: Increased accumulation of mixed density but primarily low density fluid in the subdural space along the convexity on the right, maximal thickness now 21 mm.  Increase in mass effect with right-to- left shift of 14 mm, previously 6 mm.  Possible trapping of the left lateral ventricle.  This  report to will be called to the clinical service but the P R A.   Original Report Authenticated By: Paulina Fusi, M.D.    Dg Chest Port 1 View  08/06/2012  *RADIOLOGY REPORT*  Clinical Data: Endotracheal tube placement  PORTABLE CHEST - 1 VIEW  Comparison: 07/02/2012  Findings: 2100 hours.  Endotracheal tube tip is 3.9 cm above the base of the carina.  Lung volumes are low. The cardiopericardial silhouette is enlarged.  There is bibasilar collapse / consolidation. Telemetry leads overlie the chest.  IMPRESSION: Endotracheal tube tip is 3.9 cm above the base of the carina.  Cardiomegaly with low volumes and bibasilar collapse / consolidation.   Original Report Authenticated By: Kennith Center, M.D.    Dg Abd Portable 1v  08/07/2012  *RADIOLOGY REPORT*  Clinical Data: Orogastric tube placement.  PORTABLE ABDOMEN - 1 VIEW  Comparison: 07/02/2012  Findings: Nasogastric tube/orogastric tube tip projects at the level of the right upper quadrant pain at the expected level of the gastric antrum.  Radiopaque material overlies the right aspect of the pelvis.  It is possible this represents contrast within bowel.  Exact etiology indeterminate.  Contrast within the colonic diverticula.  Inferior vena cava filter in place.  Degenerative changes lumbar spine.  Medial left base atelectatic changes.  IMPRESSION: Nasogastric tube/orogastric tube tip projects at the level of the right upper quadrant pain at the expected level of the gastric antrum.  Nonspecific radiopaque structure right aspect of the abdomen possibly contrast within bowel.  Medial left base subsegmental atelectasis.   Original Report Authenticated By: Lacy Duverney, M.D.     Assessment/Plan: Much improved neurologically following evacuation of recurrent subdural hematoma. CT scan appearance much improved as well. Certainly can be weaned to extubation from a neurosurgical perspective, with improved mental status and responsiveness. To be reassessed by CCM this  morning.   [name], MD 08/07/2012, 8:20 AM

## 2012-08-07 NOTE — Progress Notes (Signed)
INITIAL NUTRITION ASSESSMENT  DOCUMENTATION CODES Per approved criteria  -Not Applicable   INTERVENTION: Initiate Jevity 1.2 at 15 ml/hr (goal rate). Add 60 ml Prostat TID.  This TF regimen with current Propofol provides 1129 kcal (105% of needs), 105 grams of protein (> 100% of needs) and 291 ml of free water.     NUTRITION DIAGNOSIS: Inadequate oral intake related to inability to eat as evidenced by NPO status.   Goal: Pt to meet >/= 90% of estimated needs   Monitor:  Toleration of TF, weight trends   Reason for Assessment: Initiation and management of TF   79 y.o. female  Admitting Dx:  evacuation of a subacute and chronic subdural hematoma  ASSESSMENT: Pt is 79 yo female with PMH of DM, peripheral neuropathy, HYT, DVT and chronic renal insufficiency.  Pt with chronic subdural hematoma with significant mass effect and midline shift. 2/17 pt underwent first craniotomy for evacuation of a subacute and chronic subdural hematoma. 3/3 Pt admitted in transfer from inpatient rehab. Admitted with acute encephalopathy. Head CT revealed recurrence of R chronic subdural hematoma with significant mass effect and midline shift. 3/3 pt underwent second evacuation of hematoma.     Pt is currently intubated on ventilator support at this time. OG tube is placed and tip is in stomach per xray.   Temp: 36.5 MV: 6.8  Propofol drip: 3.7 ml/hr providing 97 kcal from lipid per day    Height: Ht Readings from Last 1 Encounters:  08/06/12 5' (1.524 m)    Weight: Wt Readings from Last 10 Encounters:  08/07/12 148 lb 9.4 oz (67.4 kg)  08/01/12 154 lb 15.7 oz (70.3 kg)  08/07/12 148 lb 9.4 oz (67.4 kg)  07/23/12 151 lb 3.8 oz (68.6 kg)  07/23/12 151 lb 3.8 oz (68.6 kg)  07/04/12 139 lb 8.8 oz (63.3 kg)  06/24/12 137 lb 5.6 oz (62.3 kg)    Ideal Body Weight: 100 lbs   % Ideal Body Weight: 148%  Wt Readings from Last 10 Encounters:  08/07/12 148 lb 9.4 oz (67.4 kg)  08/01/12 154 lb  15.7 oz (70.3 kg)  08/07/12 148 lb 9.4 oz (67.4 kg)  07/23/12 151 lb 3.8 oz (68.6 kg)  07/23/12 151 lb 3.8 oz (68.6 kg)  07/04/12 139 lb 8.8 oz (63.3 kg)  06/24/12 137 lb 5.6 oz (62.3 kg)    Usual Body Weight: 145 lbs   % Usual Body Weight: 102%  BMI:  Body mass index is 29.02 kg/(m^2). overweight   Estimated Nutritional Needs: Kcal: 1078  Protein: 100-115 g  Fluid: per MD  Skin: incisions head & abdomen, skin tear left lower leg   Diet Order: NPO  EDUCATION NEEDS: -No education needs identified at this time   Intake/Output Summary (Last 24 hours) at 08/07/12 1052 Last data filed at 08/07/12 1000  Gross per 24 hour  Intake   3505 ml  Output    960 ml  Net   2545 ml    Last BM: 08/06/2012   Labs:   Recent Labs Lab 08/06/12 0856 08/06/12 1540 08/07/12 0300  NA 142 140 143  K 3.0* 3.2* 3.5  CL 105 105 111  CO2 24 25 24   BUN 19 19 23   CREATININE 2.09* 2.02* 2.31*  CALCIUM 9.0 8.9 8.1*  GLUCOSE 193* 150* 141*    CBG (last 3)   Recent Labs  08/07/12 0020 08/07/12 0417 08/07/12 0751  GLUCAP 185* 134* 89    Scheduled Meds: . amLODipine  10 mg Oral Daily  . antiseptic oral rinse  15 mL Mouth Rinse QID  . atorvastatin  40 mg Oral q1800  . cefTRIAXone (ROCEPHIN)  IV  2 g Intravenous Once  . chlorhexidine  15 mL Mouth Rinse BID  . insulin aspart  0-15 Units Subcutaneous Q4H  . irbesartan  300 mg Oral Daily  . levETIRAcetam  500 mg Intravenous Q12H  . pantoprazole sodium  40 mg Per Tube Q1200    Continuous Infusions: . 0.9 % NaCl with KCl 40 mEq / L 100 mL/hr at 08/07/12 0700  . propofol 5 mcg/kg/min (08/07/12 4098)    Past Medical History  Diagnosis Date  . Diabetes mellitus   . Hypertension   . Hyperlipidemia   . Pneumonia   . Subdural hematoma   . Arthritis   . DVT (deep venous thrombosis)   . Incontinence   . Renal disorder     chronic kidney dz stage III    Past Surgical History  Procedure Laterality Date  . No past surgeries     . Insertion of vena cava filter    . Craniotomy Right 07/23/2012    Procedure: CRANIOTOMY HEMATOMA EVACUATION SUBDURAL;  Surgeon: [name], MD;  Location: [facility];  Service: Neurosurgery;  Laterality: Right;    Belenda Cruise  Dietetic Intern Pager: [phone]  Intern note/chart reviewed.  Kendell Bane RD, LDN, CNSC [phone] Pager [phone] After Hours Pager

## 2012-08-08 ENCOUNTER — Inpatient Hospital Stay (HOSPITAL_COMMUNITY): Payer: Medicare Other

## 2012-08-08 LAB — GLUCOSE, CAPILLARY
Glucose-Capillary: 122 mg/dL — ABNORMAL HIGH (ref 70–99)
Glucose-Capillary: 112 mg/dL — ABNORMAL HIGH (ref 70–99)
Glucose-Capillary: 137 mg/dL — ABNORMAL HIGH (ref 70–99)
Glucose-Capillary: 181 mg/dL — ABNORMAL HIGH (ref 70–99)
Glucose-Capillary: 98 mg/dL (ref 70–99)
Glucose-Capillary: 97 mg/dL (ref 70–99)

## 2012-08-08 LAB — CBC
HCT: 19.3 % — ABNORMAL LOW (ref 36.0–46.0)
Hemoglobin: 6.5 g/dL — CL (ref 12.0–15.0)
MCH: 25.8 pg — ABNORMAL LOW (ref 26.0–34.0)
MCH: 26.9 pg (ref 26.0–34.0)
MCHC: 33.7 g/dL (ref 30.0–36.0)
MCHC: 34.1 g/dL (ref 30.0–36.0)
MCV: 76.6 fL — ABNORMAL LOW (ref 78.0–100.0)
MCV: 78.8 fL (ref 78.0–100.0)
Platelets: 298 10*3/uL (ref 150–400)
Platelets: 339 10*3/uL (ref 150–400)
RBC: 2.52 MIL/uL — ABNORMAL LOW (ref 3.87–5.11)
RDW: 15.8 % — ABNORMAL HIGH (ref 11.5–15.5)
WBC: 9.5 10*3/uL (ref 4.0–10.5)

## 2012-08-08 LAB — BASIC METABOLIC PANEL
BUN: 28 mg/dL — ABNORMAL HIGH (ref 6–23)
CO2: 20 mEq/L (ref 19–32)
Calcium: 8 mg/dL — ABNORMAL LOW (ref 8.4–10.5)
Chloride: 116 mEq/L — ABNORMAL HIGH (ref 96–112)
Creatinine, Ser: 2.37 mg/dL — ABNORMAL HIGH (ref 0.50–1.10)
GFR calc Af Amer: 20 mL/min — ABNORMAL LOW (ref >90)
GFR calc non Af Amer: 17 mL/min — ABNORMAL LOW (ref >90)
Glucose, Bld: 126 mg/dL — ABNORMAL HIGH (ref 70–99)
Potassium: 3.9 mEq/L (ref 3.5–5.1)
Sodium: 145 mEq/L (ref 135–145)

## 2012-08-08 LAB — PREPARE RBC (CROSSMATCH)

## 2012-08-08 LAB — MAGNESIUM: Magnesium: 1.7 mg/dL (ref 1.5–2.5)

## 2012-08-08 MED ORDER — LABETALOL HCL 5 MG/ML IV SOLN
10.0000 mg | INTRAVENOUS | Status: DC | PRN
  Administered 2012-08-08: 20 mg via INTRAVENOUS
  Administered 2012-08-08 (×2): 10 mg via INTRAVENOUS
  Administered 2012-08-09 – 2012-08-10 (×3): 20 mg via INTRAVENOUS
  Filled 2012-08-08 (×6): qty 4

## 2012-08-08 MED ORDER — CLONIDINE HCL 0.3 MG/24HR TD PTWK
0.3000 mg | MEDICATED_PATCH | TRANSDERMAL | Status: DC
  Administered 2012-08-08: 0.3 mg via TRANSDERMAL
  Filled 2012-08-08: qty 1

## 2012-08-08 MED ORDER — PANTOPRAZOLE SODIUM 40 MG IV SOLR
40.0000 mg | Freq: Every day | INTRAVENOUS | Status: DC
  Administered 2012-08-08: 40 mg via INTRAVENOUS
  Filled 2012-08-08 (×2): qty 40

## 2012-08-08 MED ORDER — SODIUM CHLORIDE 0.9 % IV SOLN
INTRAVENOUS | Status: DC
  Administered 2012-08-08: 14:00:00 via INTRAVENOUS

## 2012-08-08 NOTE — Progress Notes (Signed)
eLink Physician-Brief Progress Note Patient Name: Temesha Queener DOB: 1922-02-05 MRN: [id number]  Date of Service  08/08/2012   HPI/Events of Note   High BP  eICU Interventions  Clonidine patch - home med Labetalol prn      Alexes Lamarque V. 08/08/2012, 5:52 PM

## 2012-08-08 NOTE — Progress Notes (Signed)
Subjective: Patient resting in bed, continues on ventilator via ETT. Has been sedated by CCM with Diprivan.continues to have bloody watery drainage into the JP drains.   Objective: Vital signs in last 24 hours: Filed Vitals:   08/08/12 1000 08/08/12 1038 08/08/12 1100 08/08/12 1130  BP: 170/64 184/65 181/84 173/71  Pulse: 81  83 93  Temp:  98.1 F (36.7 C)  98.1 F (36.7 C)  TempSrc:  Axillary  Axillary  Resp: 18  19 22   Height:      Weight:      SpO2: 100%  100%     Intake/Output from previous day: 03/04 0701 - 03/05 0700 In: 3161.1 [I.V.:2531.1; NG/GT:420; IV Piggyback:210] Out: 870 [Urine:660; Drains:210] Intake/Output this shift: Total I/O In: 250 [I.V.:250] Out: -   Physical Exam:   Opens eyes to voice. Follows commands with all 4 extremities. Moving right side better than left side.   CBC  Recent Labs  08/07/12 0300 08/08/12 0415  WBC 11.5* 9.5  HGB 7.3* 6.5*  HCT 22.0* 19.3*  PLT 313 298   BMET  Recent Labs  08/07/12 0300 08/08/12 0415  NA 143 145  K 3.5 3.9  CL 111 116*  CO2 24 20  GLUCOSE 141* 126*  BUN 23 28*  CREATININE 2.31* 2.37*  CALCIUM 8.1* 8.0*    Studies/Results: Ct Head Wo Contrast  08/07/2012  *RADIOLOGY REPORT*  Clinical Data: Post craniotomy for subdural hematoma.  On ventilator.  CT HEAD WITHOUT CONTRAST  Technique:  Contiguous axial images were obtained from the base of the skull through the vertex without contrast.  Comparison: Most recent examination 08/06/2012.  Findings: Interval placement of to right sided subdural drains with a small amount of pneumocephalus and decrease in the size of right- sided subdural collection now with maximal thickness of 10.5 mm versus prior 21 mm.  Small amount of acute blood is seen within the residual subdural collection.  Decrease in degree of mass effect and midline shift to the left now 3.7 mm versus prior 14.1 mm.  Left lateral ventricle trapping appears slightly less prominent.  Remote left with  parietal lobe infarct without CT evidence of large acute infarct.  No intracranial mass lesion detected on this unenhanced exam.  Vascular calcifications.  IMPRESSION: Interval placement of two right-sided subdural drains and decrease in size of right-sided subdural hematoma and mass effect as detailed above.  Results called to floor 08/07/2012 5:58 a.m.by Dr. Cherly Hensen.   Original Report Authenticated By: Lacy Duverney, M.D.    Dg Chest Port 1 View  08/08/2012  *RADIOLOGY REPORT*  Clinical Data: Evaluate ET tube  PORTABLE CHEST - 1 VIEW  Comparison: 08/06/2012  Findings: The endotracheal tube tip is above the carina.  The heart size appears normal.  There are bilateral pleural effusions and mild edema, similar to previous exam. Airspace consolidation and atelectasis within the lung bases appear similar to previous exam.  IMPRESSION:  1.  Stable position of ET tube with tip above the carina. 2. No change in aeration the lungs compared with previous exam.   Original Report Authenticated By: Signa Kell, M.D.    Dg Chest Port 1 View  08/06/2012  *RADIOLOGY REPORT*  Clinical Data: Endotracheal tube placement  PORTABLE CHEST - 1 VIEW  Comparison: 07/02/2012  Findings: 2100 hours.  Endotracheal tube tip is 3.9 cm above the base of the carina.  Lung volumes are low. The cardiopericardial silhouette is enlarged.  There is bibasilar collapse / consolidation. Telemetry leads overlie the  chest.  IMPRESSION: Endotracheal tube tip is 3.9 cm above the base of the carina.  Cardiomegaly with low volumes and bibasilar collapse / consolidation.   Original Report Authenticated By: Kennith Center, M.D.    Dg Abd Portable 1v  08/07/2012  *RADIOLOGY REPORT*  Clinical Data: Orogastric tube placement.  PORTABLE ABDOMEN - 1 VIEW  Comparison: 07/02/2012  Findings: Nasogastric tube/orogastric tube tip projects at the level of the right upper quadrant pain at the expected level of the gastric antrum.  Radiopaque material overlies the right  aspect of the pelvis.  It is possible this represents contrast within bowel.  Exact etiology indeterminate.  Contrast within the colonic diverticula.  Inferior vena cava filter in place.  Degenerative changes lumbar spine.  Medial left base atelectatic changes.  IMPRESSION: Nasogastric tube/orogastric tube tip projects at the level of the right upper quadrant pain at the expected level of the gastric antrum.  Nonspecific radiopaque structure right aspect of the abdomen possibly contrast within bowel.  Medial left base subsegmental atelectasis.   Original Report Authenticated By: Lacy Duverney, M.D.     Assessment/Plan: Neurologically responsive, certainly significantly improved as compared to prior surgery. Patient examined with Dr. Kalman Shan from CCM. He is going to reassess weaning parameters to determine whether patient be successfully extubated. We'll progress through further recovery once extubated and breathing on her own. Will leave J-P drains in for now.    [name], MD 08/08/2012, 11:36 AM

## 2012-08-08 NOTE — Progress Notes (Signed)
NUTRITION FOLLOW UP  Intervention:   - Diet advancement per MD discretion  - Swallow evaluation when ready   Nutrition Dx:   Inadequate oral intake related to inability to eat as evidenced by NPO status. - ongoing   Goal:   Pt to meet >/= 90% of estimated needs - currently unmet  Monitor:   Toleration of diet advancement, weight trends   Assessment:   Pt is 79 yo female with PMH of DM, peripheral neuropathy, HYT, DVT and chronic renal insufficiency.  Pt with chronic subdural hematoma with significant mass effect and midline shift. 2/17 pt underwent first craniotomy for evacuation of a subacute and chronic subdural hematoma. 3/3 Pt admitted in transfer from inpatient rehab. Admitted with acute encephalopathy. Head CT revealed recurrence of R chronic subdural hematoma with significant mass effect and midline shift. 3/3 pt underwent second evacuation of hematoma.   3/4 pt intubated, sedated on propofol and receieved enteral nutrition. Pt extubated 3/5 and is no longer sedated on propofol.    Dietetic Intern spoke with RN.  Pt currently confused and not a candidate for swallow evaluation today.    Height: Ht Readings from Last 1 Encounters:  08/06/12 5' (1.524 m)    Weight Status:   Wt Readings from Last 1 Encounters:  08/08/12 146 lb 9.7 oz (66.5 kg)    Re-estimated needs:  Kcal: 1250-1450  Protein: 90-105 g  Fluid: Per MD  Skin: incisions head & abdomen, skin tear left lower leg   Diet Order: NPO   Intake/Output Summary (Last 24 hours) at 08/08/12 1504 Last data filed at 08/08/12 0923  Gross per 24 hour  Intake 2364.95 ml  Output    570 ml  Net 1794.95 ml    Last BM: 08/06/2012   Labs:   Recent Labs Lab 08/06/12 1540 08/07/12 0300 08/08/12 0415  NA 140 143 145  K 3.2* 3.5 3.9  CL 105 111 116*  CO2 25 24 20   BUN 19 23 28*  CREATININE 2.02* 2.31* 2.37*  CALCIUM 8.9 8.1* 8.0*  MG  --   --  1.7  PHOS  --   --  3.9  GLUCOSE 150* 141* 126*    CBG (last  3)   Recent Labs  08/08/12 0341 08/08/12 0732 08/08/12 1233  GLUCAP 112* 137* 181*    Scheduled Meds: . amLODipine  10 mg Oral Daily  . antiseptic oral rinse  15 mL Mouth Rinse QID  . atorvastatin  40 mg Oral q1800  . cefTRIAXone (ROCEPHIN)  IV  2 g Intravenous Once  . chlorhexidine  15 mL Mouth Rinse BID  . feeding supplement (JEVITY 1.2 CAL)  1,000 mL Per Tube Q24H  . feeding supplement  60 mL Per Tube TID  . insulin aspart  0-15 Units Subcutaneous Q4H  . irbesartan  300 mg Oral Daily  . levETIRAcetam  500 mg Intravenous Q12H  . pantoprazole (PROTONIX) IV  40 mg Intravenous QHS    Continuous Infusions: . sodium chloride 10 mL/hr at 08/08/12 1335  . propofol Stopped (08/08/12 0748)    Belenda Cruise  Dietetic Intern Pager: [phone]   Intern note/chart reviewed. Kendell Bane RD, LDN, CNSC [phone] Pager [phone] After Hours Pager

## 2012-08-08 NOTE — Progress Notes (Signed)
I don't again on the patient t with neurosurgeon  Currently patient RASS sedation score is -1 she is following commands moves all fours appropriately. Patient is a positive cuff leak test so we will extubate the patient   Dr. Kalman Shan, M.D., [facility].C.P Pulmonary and Critical Care Medicine Staff Physician [place] [facility] [place] [facility] Critical [facility] Pager: [phone], If no answer or between  15:00h - 7:00h: call [phone]  08/08/2012 11:34 AM

## 2012-08-08 NOTE — Procedures (Signed)
Extubation Procedure Note  Patient Details:   Name: April [name] DOB: [birth year] MRN: [id number]   Pt extubated to [facility] per MD order after successful SBT.  Pt has good cough and able to vocalize.  Positive cuff leak noted prior to extubation.  VS stable, will continue to monitor.    Evaluation  O2 sats: stable throughout Complications: No apparent complications Patient did tolerate procedure well. Bilateral Breath Sounds: Clear;Diminished Suctioning: Airway Yes  Turner, Gena Apple 08/08/2012, 11:42 AM

## 2012-08-08 NOTE — Progress Notes (Signed)
CRITICAL VALUE ALERT  Critical value received: Hgb 6.5  Date of notification: 08/08/12  Time of notification:  0545  Critical value read back:yes  Nurse who received alert:  shelli coggins  MD notified (1st page):  Dr. Krista Blue  Time of first page:  0545  MD notified (2nd page):  Time of second page:  Responding MD:  Dr. Krista Blue   Time MD responded:  [phone]

## 2012-08-08 NOTE — Progress Notes (Signed)
Anemia   No active bleeding; no anticoagulation  Transfuse 1 unit of PRBC

## 2012-08-08 NOTE — Progress Notes (Signed)
Pt readmitted from IP rehab where she was 07/30/12 through 08/06/12. Would recommend consult PT, OT, and SLP as soon as appropriate. I will follow her progress. [phone]

## 2012-08-08 NOTE — Progress Notes (Signed)
PULMONARY  / CRITICAL CARE MEDICINE  Name: April [name] MRN: [id number] DOB: [birth year]    ADMISSION DATE:  08/06/2012 CONSULTATION DATE:  08/06/2012  REFERRING MD :  Neurosurgery Newell Coral)  CHIEF COMPLAINT:  Acute respiratory failure.  BRIEF PATIENT DESCRIPTION:  79 yo with recurrent R chronic subdural hematoma s/p evacuation, left hemiparesis and acute encephalopathy.  LINES / TUBES: OETT  3/3 >>> OGT  3/3 >>> Foley  3/3 >>>  CULTURES:  ANTIBIOTICS: Ceftriaxone  3/3 x1  SIGNIFICANT EVENTS / STUDIES:  3/3  Admitted with acute encephalopathy.  Head CT >>> Recurrence of R chronic subdural hematoma with significant mass effect and midline shift.  OR >>> evacuation of hematoma.   08/07/2012: Agitated on wake of assessment while on when necessary fentanyl. Does not open eyes spontaneously   SUBJECTIVE/OVERNIGHT/INTERVAL HX 08/08/2012: According to the nurse patient did follow commands once and to put her tongue out. At the time of my exam she has been off propofol for 2 hours and her RASS sedation score is -3  VITAL SIGNS: Temp:  [97.1 F (36.2 C)-98.6 F (37 C)] 98.1 F (36.7 C) (03/05 0938) Pulse Rate:  [59-83] 74 (03/05 0938) Resp:  [0-22] 15 (03/05 0938) BP: (120-181)/(48-76) 172/62 mmHg (03/05 0938) SpO2:  [100 %] 100 % (03/05 0930) FiO2 (%):  [30 %] 30 % (03/05 0930) Weight:  [66.5 kg (146 lb 9.7 oz)] 66.5 kg (146 lb 9.7 oz) (03/05 0500) HEMODYNAMICS:   VENTILATOR SETTINGS: Vent Mode:  [-] PSV;CPAP FiO2 (%):  [30 %] 30 % Set Rate:  [15 bmp] 15 bmp Vt Set:  [400 mL] 400 mL PEEP:  [5 cmH20] 5 cmH20 Pressure Support:  [10 cmH20] 10 cmH20 Plateau Pressure:  [13 cmH20-19 cmH20] 17 cmH20 INTAKE / OUTPUT: Intake/Output     03/04 0701 - 03/05 0700 03/05 0701 - 03/06 0700   I.V. (mL/kg) 2531.1 (38.1) 250 (3.8)   NG/GT 420    IV Piggyback 210    Total Intake(mL/kg) 3161.1 (47.5) 250 (3.8)   Urine (mL/kg/hr) 660 (0.4)    Drains 210 (0.1)    Blood     Total Output  870     Net +2291.1 +250         PHYSICAL EXAMINATION: General:  Appears comfortable, mechanically ventilated, synchronous Neuro:  Encephalopathic, cough / gag present. Does not open eyes spontaneously. Moving all fours. RASS sedation score is -3y  HEENT:  PERRL, OETT / OGT Cardiovascular:  RRR, no m/r/g Lungs:  CTAB Abdomen:  Soft, nontender, bowel sounds diminished Musculoskeletal:  Residual left hemiparesis Skin:  Intact  LABS:  Recent Labs Lab 08/06/12 1540 08/06/12 2100 08/07/12 0300 08/08/12 0415  HGB 9.2*  --  7.3* 6.5*  WBC 6.9  --  11.5* 9.5  PLT 369  --  313 298  NA 140  --  143 145  K 3.2*  --  3.5 3.9  CL 105  --  111 116*  CO2 25  --  24 20  GLUCOSE 150*  --  141* 126*  BUN 19  --  23 28*  CREATININE 2.02*  --  2.31* 2.37*  CALCIUM 8.9  --  8.1* 8.0*  MG  --   --   --  1.7  PHOS  --   --   --  3.9  PHART  --  7.337*  --   --   PCO2ART  --  42.8  --   --   PO2ART  --  120.0*  --   --  Recent Labs Lab 08/07/12 1605 08/07/12 1952 08/07/12 2331 08/08/12 0341 08/08/12 0732  GLUCAP 109* 124* 122* 112* 137*    Ct Head Wo Contrast  08/07/2012  *RADIOLOGY REPORT*  Clinical Data: Post craniotomy for subdural hematoma.  On ventilator.  CT HEAD WITHOUT CONTRAST  Technique:  Contiguous axial images were obtained from the base of the skull through the vertex without contrast.  Comparison: Most recent examination 08/06/2012.  Findings: Interval placement of to right sided subdural drains with a small amount of pneumocephalus and decrease in the size of right- sided subdural collection now with maximal thickness of 10.5 mm versus prior 21 mm.  Small amount of acute blood is seen within the residual subdural collection.  Decrease in degree of mass effect and midline shift to the left now 3.7 mm versus prior 14.1 mm.  Left lateral ventricle trapping appears slightly less prominent.  Remote left with parietal lobe infarct without CT evidence of large acute infarct.  No  intracranial mass lesion detected on this unenhanced exam.  Vascular calcifications.  IMPRESSION: Interval placement of two right-sided subdural drains and decrease in size of right-sided subdural hematoma and mass effect as detailed above.  Results called to floor 08/07/2012 5:58 a.m.by Dr. Cherly Hensen.   Original Report Authenticated By: Lacy Duverney, M.D.    Dg Chest Port 1 View  08/08/2012  *RADIOLOGY REPORT*  Clinical Data: Evaluate ET tube  PORTABLE CHEST - 1 VIEW  Comparison: 08/06/2012  Findings: The endotracheal tube tip is above the carina.  The heart size appears normal.  There are bilateral pleural effusions and mild edema, similar to previous exam. Airspace consolidation and atelectasis within the lung bases appear similar to previous exam.  IMPRESSION:  1.  Stable position of ET tube with tip above the carina. 2. No change in aeration the lungs compared with previous exam.   Original Report Authenticated By: Signa Kell, M.D.    Dg Chest Port 1 View  08/06/2012  *RADIOLOGY REPORT*  Clinical Data: Endotracheal tube placement  PORTABLE CHEST - 1 VIEW  Comparison: 07/02/2012  Findings: 2100 hours.  Endotracheal tube tip is 3.9 cm above the base of the carina.  Lung volumes are low. The cardiopericardial silhouette is enlarged.  There is bibasilar collapse / consolidation. Telemetry leads overlie the chest.  IMPRESSION: Endotracheal tube tip is 3.9 cm above the base of the carina.  Cardiomegaly with low volumes and bibasilar collapse / consolidation.   Original Report Authenticated By: Kennith Center, M.D.    Dg Abd Portable 1v  08/07/2012  *RADIOLOGY REPORT*  Clinical Data: Orogastric tube placement.  PORTABLE ABDOMEN - 1 VIEW  Comparison: 07/02/2012  Findings: Nasogastric tube/orogastric tube tip projects at the level of the right upper quadrant pain at the expected level of the gastric antrum.  Radiopaque material overlies the right aspect of the pelvis.  It is possible this represents contrast within  bowel.  Exact etiology indeterminate.  Contrast within the colonic diverticula.  Inferior vena cava filter in place.  Degenerative changes lumbar spine.  Medial left base atelectatic changes.  IMPRESSION: Nasogastric tube/orogastric tube tip projects at the level of the right upper quadrant pain at the expected level of the gastric antrum.  Nonspecific radiopaque structure right aspect of the abdomen possibly contrast within bowel.  Medial left base subsegmental atelectasis.   Original Report Authenticated By: Lacy Duverney, M.D.      ASSESSMENT / PLAN:  PULMONARY  Recent Labs Lab 08/06/12 2100  PHART 7.337*  PCO2ART 42.8  PO2ART 120.0*  HCO3 22.3  TCO2 23.6  O2SAT 99.3    A:  Acute postop respiratory failure.  Bilateral atelectasis.  History of extubation failure secondary to ? cord edema. - 08/08/2012: Does not meet extubation criteria due to RASS sedation score -3 P:   Full mechanical support Daily SBT Trend ABG / CXR In future when she meets extubation criteria if there is no cuff leak test positivity, then consider a short course steroids  CARDIOVASCULAR  No results found for this basename: TROPONINI,  in the last 168 hours No results found for this basename: PROBNP,  in the last 168 hours  A: DVT s/p IVC filter placement. HTN.  Bradycardia, likely secondary to Coreg / Clonidine - 08/07/2012: Heart rate 60 sinus bradycardia with normal blood pressure  - March of 2014: No change PLAN Goal MAP per Neurosurgery < 170 (starting propofol 08/07/2012] Hold Clonidine / Coreg Continue Norvasc, Micardis (caution with renal failure) Hydralazine PRN  RENAL  Recent Labs Lab 08/03/12 1135 08/06/12 0856 08/06/12 1540 08/07/12 0300 08/08/12 0415  NA 141 142 140 143 145  K 3.4* 3.0* 3.2* 3.5 3.9  CL 107 105 105 111 116*  CO2 25 24 25 24 20   GLUCOSE 173* 193* 150* 141* 126*  BUN 19 19 19 23  28*  CREATININE 2.19* 2.09* 2.02* 2.31* 2.37*  CALCIUM 8.6 9.0 8.9 8.1* 8.0*  MG  --    --   --   --  1.7  PHOS  --   --   --   --  3.9    A:  CKD.  - 08/08/2012: Creatinine is stable P:   Trend BMP Defer K supplementation NS @ 100  GASTROINTESTINAL No results found for this basename: AST, ALT, ALKPHOS, BILITOT, PROT, ALBUMIN, INR,  in the last 168 hours  A:  No active issues. P:   TF will call nutrition consult  Protonix for GI Px  HEMATOLOGIC  Recent Labs Lab 08/06/12 1540 08/07/12 0300 08/08/12 0415  HGB 9.2* 7.3* 6.5*  HCT 27.5* 22.0* 19.3*  WBC 6.9 11.5* 9.5  PLT 369 313 298    A:  Anemia, acute on chronic (renal disease and acute blood loss). - 08/08/2012: Hemoglobin less than 7 g percent has anemia of critical illness in the status post 1 unit PRBC P:  Trend CBC SCDs for DVT Px - PRBC for hgb </= 6.9gm%    - exceptions are   -  if ACS susepcted/confirmed then transfuse for hgb </= 8.0gm%,  or    -  If septic shock first 24h and scvo2 < 70% then transfuse for hgb </= 9.0gm%   - active bleeding with hemodynamic instability, then transfuse regardless of hemoglobin value   At at all times try to transfuse 1 unit prbc as possible with exception of active hemorrhage    INFECTIOUS No results found for this basename: LATICACIDVEN, PROCALCITON,  in the last 168 hours  Recent Labs Lab 08/06/12 0856 08/06/12 1540 08/07/12 0300 08/08/12 0415  WBC 6.1 6.9 11.5* 9.5    A:  No evidence of acute infection. - 08/07/2012: No fever  P:   No intervention required  ENDOCRINE  A:  DM2, uncontrolled.  Hyperglycemia.  P:   SSI  NEUROLOGIC A:  Recurrent SDH s/p evacuation. - 08/07/2012: Restless and agitated - 08/08/2012: RASS sedation score is -3 off propofol P:   On propofol 08/07/2012 Per Neurosurgery; I last knows to talk to Dr. Newell Coral and decide if the CT  scan should be obtained of the head Goal RASS 0 to -1 Morphine PRN  Global - 08/08/2012: No family at bedside x  72 hours at time of critical care medicine M.D. rounds   CRITICAL  CARE:  The patient is critically ill with multiple organ systems failure and requires high complexity decision making for assessment and support, frequent evaluation and titration of therapies, application of advanced monitoring technologies and extensive interpretation of multiple databases. Critical Care Time devoted to patient care services described in this note is 35 minutes.     Dr. Kalman Shan, M.D., [facility].C.P Pulmonary and Critical Care Medicine Staff Physician  System [place] Pulmonary and Critical Care Pager: [phone], If no answer or between  15:00h - 7:00h: call [phone]  08/08/2012 10:42 AM

## 2012-08-08 NOTE — Progress Notes (Signed)
VDRF post neuro surgery, agitated;  Restrains ordered

## 2012-08-09 DIAGNOSIS — N179 Acute kidney failure, unspecified: Secondary | ICD-10-CM

## 2012-08-09 DIAGNOSIS — J189 Pneumonia, unspecified organism: Secondary | ICD-10-CM

## 2012-08-09 LAB — GLUCOSE, CAPILLARY
Glucose-Capillary: 103 mg/dL — ABNORMAL HIGH (ref 70–99)
Glucose-Capillary: 107 mg/dL — ABNORMAL HIGH (ref 70–99)
Glucose-Capillary: 111 mg/dL — ABNORMAL HIGH (ref 70–99)
Glucose-Capillary: 112 mg/dL — ABNORMAL HIGH (ref 70–99)
Glucose-Capillary: 121 mg/dL — ABNORMAL HIGH (ref 70–99)
Glucose-Capillary: 164 mg/dL — ABNORMAL HIGH (ref 70–99)

## 2012-08-09 LAB — BASIC METABOLIC PANEL
BUN: 31 mg/dL — ABNORMAL HIGH (ref 6–23)
Calcium: 8.9 mg/dL (ref 8.4–10.5)
Creatinine, Ser: 2.24 mg/dL — ABNORMAL HIGH (ref 0.50–1.10)
GFR calc Af Amer: 21 mL/min — ABNORMAL LOW (ref >90)

## 2012-08-09 LAB — MAGNESIUM: Magnesium: 1.7 mg/dL (ref 1.5–2.5)

## 2012-08-09 MED ORDER — POTASSIUM CHLORIDE CRYS ER 20 MEQ PO TBCR
40.0000 meq | EXTENDED_RELEASE_TABLET | Freq: Once | ORAL | Status: AC
  Administered 2012-08-09: 40 meq via ORAL
  Filled 2012-08-09: qty 2

## 2012-08-09 MED ORDER — ACETAMINOPHEN 325 MG PO TABS: 325.0000 mg | ORAL_TABLET | ORAL | Status: DC | PRN

## 2012-08-09 MED ORDER — MAGNESIUM SULFATE 50 % IJ SOLN
1.0000 g | Freq: Once | INTRAVENOUS | Status: DC
  Filled 2012-08-09: qty 2

## 2012-08-09 MED ORDER — MAGNESIUM SULFATE IN D5W 10-5 MG/ML-% IV SOLN
1.0000 g | Freq: Once | INTRAVENOUS | Status: AC
  Administered 2012-08-09: 1 g via INTRAVENOUS
  Filled 2012-08-09: qty 100

## 2012-08-09 MED ORDER — BOOST / RESOURCE BREEZE PO LIQD
1.0000 | Freq: Three times a day (TID) | ORAL | Status: DC
  Administered 2012-08-09 – 2012-08-13 (×9): 1 via ORAL
  Filled 2012-08-09 (×4): qty 1

## 2012-08-09 NOTE — Evaluation (Signed)
Occupational Therapy Evaluation Patient Details Name: April [name] MRN: [id number] DOB: [birth year] Today's Date: 08/09/2012 Time: 0900-1000 OT Time Calculation (min): 60 min  OT Assessment / Plan / Recommendation Clinical Impression  79 yo female readmitted from CIR due to 14mm shift Rt to Lt chronic SDH. This is 4th admission to acute rehab within 3 months. Pt admitted previously with PNA, encephalopathy, and small Rt SDH. Pt previously MOD I for all mobility. Ot to follow acutely. Recommend CIR for d/c planning    OT Assessment  Patient needs continued OT Services    Follow Up Recommendations  CIR    Barriers to Discharge      Equipment Recommendations  3 in 1 bedside comode (RW)    Recommendations for Other Services Rehab consult  Frequency  Min 3X/week    Precautions / Restrictions Precautions Precautions: Fall;Other (comment) Precaution Comments: NO Bone flap right side of skull, two JP drains, Decreased safety awareness, decreased awareness of deficits   Pertinent Vitals/Pain Stable and monitored    ADL  Eating/Feeding: Maximal assistance;Min guard Where Assessed - Eating/Feeding: Chair (see OT note session) Grooming: Wash/dry face;Min guard Where Assessed - Grooming: Supported sitting Lower Body Dressing: +1 Total assistance Where Assessed - Lower Body Dressing: Supported standing Toilet Transfer: Moderate assistance Toilet Transfer Method: Sit to stand Toilet Transfer Equipment: Regular height toilet;Grab bars Toileting - Clothing Manipulation and Hygiene: +1 Total assistance Where Assessed - Toileting Clothing Manipulation and Hygiene: Sit to stand from 3-in-1 or toilet Equipment Used: Gait belt;Rolling walker Transfers/Ambulation Related to ADLs: Pt ambulating with RW using the RW too far in advance with therapist providing max (A) to keep walker close for support. Pt needed cueing to maintain attention to task ADL Comments: Pt pleasant and immediately attempting  to exit bed on Right side. Pt could benefit from helmet due to poor cognition and mobility to roll onto Rt side of skull without bone flap. Pt required (A) to initiate all task this session. Pt standing at sink holding tooth brush with visual auditory and tactile cue to brush teeth. Pt continued task once assisted with starting. Pt when asked to open mouth barely opening mouth but demonstrates during session ability to open wider. Pt with whiteness noted on tongue and risk for thrush due to poor oral hygiene. Pt assisted with oral care for hygiene detail max (A). pt eating jello and broth with hand over hand max (A) at the start of the meal. PT progressed to min guard (A) with repetition and auditory cues. Pt with poor grasp and edema noted in bil hands. pt with eyes watering bil . WHen ambulating to restroom adult diaper falling down, pt immediately grabbed for diaper with Rt UE and held in place. Pt states "my underwear is falling" pt very appropriate and accurate in expressing needs and location of the need   OT Diagnosis: Generalized weakness;Cognitive deficits  OT Problem List: Decreased strength;Decreased activity tolerance;Impaired balance (sitting and/or standing);Decreased cognition;Decreased safety awareness;Decreased knowledge of use of DME or AE OT Treatment Interventions: Self-care/ADL training;Therapeutic exercise;Neuromuscular education;DME and/or AE instruction;Therapeutic activities;Cognitive remediation/compensation;Patient/family education;Balance training   OT Goals Acute Rehab OT Goals OT Goal Formulation: With patient Time For Goal Achievement: 08/23/12 Potential to Achieve Goals: Good ADL Goals Pt Will Perform Eating: with set-up;Sitting, chair;Supported ADL Goal: Eating - Progress: Goal set today Pt Will Perform Grooming: with min assist;Standing at sink;Supported ADL Goal: Grooming - Progress: Goal set today Pt Will Perform Upper Body Bathing: with min assist;Sitting at  sink;Supported ADL  Goal: Upper Body Bathing - Progress: Goal set today Pt Will Perform Upper Body Dressing: with min assist;Sitting, chair;Supported ADL Goal: Patent attorney - Progress: Goal set today Pt Will Transfer to Toilet: with min assist;Ambulation;3-in-1 ADL Goal: Toilet Transfer - Progress: Goal set today Pt Will Perform Toileting - Clothing Manipulation: with min assist;Sitting on 3-in-1 or toilet ADL Goal: Toileting - Clothing Manipulation - Progress: Goal set today Miscellaneous OT Goals Miscellaneous OT Goal #1: Pt will complete bed mobilty Supervision level as precursor to adls OT Goal: Miscellaneous Goal #1 - Progress: Goal set today  Visit Information  Last OT Received On: 08/09/12 Assistance Needed: +2 (lines) PT/OT Co-Evaluation/Treatment: Yes    Subjective Data  Subjective: "That was good thank you"- pt response to breakfast Patient Stated Goal: unable to participate in goal setting at this time   Prior Functioning     Home Living Lives With: Son Available Help at Discharge: Family;Available PRN/intermittently Type of Home: House Home Access: Stairs to enter [employer] of Steps: 3-4 Entrance Stairs-Rails: Right Home Layout: One level Bathroom Shower/Tub: Engineer, manufacturing systems: Standard Home Adaptive Equipment: None Additional Comments: patient was ambulating MOD I on d/c 06/23/12 with no DME Prior Function Level of Independence: Independent Able to Take Stairs?: Yes Driving: No Vocation: Retired Musician: Other (comment) (mumbling answers) Dominant Hand: Right         Vision/Perception Vision - History Baseline Vision: Wears glasses only for reading Patient Visual Report: No change from baseline   Cognition  Cognition Overall Cognitive Status: Impaired Area of Impairment: Attention;Memory;Problem solving;Awareness of deficits Arousal/Alertness: Awake/alert Orientation Level: Disoriented  to;Place;Time;Situation Behavior During Session: Flat affect Current Attention Level: Sustained Memory Deficits: no recall of events- did not know she had surgery again, did not know she was in ICU Following Commands: Follows one step commands consistently;Follows one step commands with increased time Safety/Judgement: Decreased awareness of safety precautions;Impulsive;Decreased safety judgement for tasks assessed;Decreased awareness of need for assistance Awareness of Errors: Assistance required to identify errors made;Assistance required to correct errors made Problem Solving: slow processing Cognition - Other Comments: Pt unable to report correct month of her birthday, but was able to state correct day.  when asked if she was at home, "no", when asked if she was at [employer], "yes".  RN reports earlier she was able to report she was at the hospital.      Extremity/Trunk Assessment Right Upper Extremity Assessment RUE ROM/Strength/Tone: Deficits;Due to impaired cognition RUE ROM/Strength/Tone Deficits: 4 out 5 and edeam noted at hand RUE Sensation: WFL - Light Touch RUE Coordination: WFL - gross motor Left Upper Extremity Assessment LUE ROM/Strength/Tone: Deficits;Due to impaired cognition LUE ROM/Strength/Tone Deficits: grossly 4 out 5 with edema noted at hand LUE Sensation: WFL - Light Touch LUE Coordination: WFL - gross motor  Trunk Assessment Trunk Assessment: Normal     Mobility Bed Mobility Supine to Sit: 4: Min assist;With rails Sitting - Scoot to Edge of Bed: 4: Min assist Details for Bed Mobility Assistance: min assist to support trunk while moving on unlevel and compliant mattress, pt managing her own legs Transfers Sit to Stand: 3: Mod assist;With upper extremity assist;From bed;From toilet Stand to Sit: 3: Mod assist;With upper extremity assist;With armrests;To chair/3-in-1;To toilet Details for Transfer Assistance: upon first standing two person assist for safety to  get steady on her feet, but with subsequent stands mod assist to support trunk  over weak legs especially from lower toliet and to control descent to sit.  Exercise     Balance Static Sitting Balance Static Sitting - Balance Support: Bilateral upper extremity supported;Feet supported Static Sitting - Level of Assistance: 5: Stand by assistance Static Sitting - Comment/# of Minutes: sat EOB x 5 mins in prep for standing.  Static Standing Balance Static Standing - Balance Support: Bilateral upper extremity supported Static Standing - Level of Assistance: 1: +2 Total assist;Patient percentage (comment) (70) Static Standing - Comment/# of Minutes: stood with bil hand held assist EOB before starting to walk to bathroom < 1 min Dynamic Standing Balance Dynamic Standing - Balance Support: Left upper extremity supported Dynamic Standing - Level of Assistance: 3: Mod assist;4: Min assist Dynamic Standing - Comments: during functional activity of attmpting clean up after urinating.  Pt unable to realize that she was trying to wipe through her gown.  Needed total assist to successfully complete task of wiping.  Mod assist for balance while attempting task.  At sink later after walking min assist  for balance while brushing teeth needed for trunk support over weakening right leg and for balance.     End of Session OT - End of Session Activity Tolerance: Patient tolerated treatment well Patient left: in chair;with call bell/phone within reach;with chair alarm set;with restraints reapplied Nurse Communication: Mobility status;Precautions  GO     Lucile Shutters 08/09/2012, 11:48 AM Pager: [phone]

## 2012-08-09 NOTE — Evaluation (Signed)
Physical Therapy Evaluation Patient Details Name: April [name] MRN: [id number] DOB: [birth year] Today's Date: 08/09/2012 Time: 9562-1308 PT Time Calculation (min): 34 min  PT Assessment / Plan / Recommendation Clinical Impression  79 y.o. female re-admitted acutely due to worsening SDH needing repeat evacuation.  She currently does not have a right skull cap per sign in room.  She presents today with decreased mobility decreased leg strength, functionally right is buckling today not left. Decreased ability to walk without assist/assistive device, decreased cognition (safety awareness, memory, awareness of deficits) and decreased balance.  She would benefit from returning to CIR to complete her rehabilitation course.      PT Assessment  Patient needs continued PT services    Follow Up Recommendations  CIR    Does the patient have the potential to tolerate intense rehabilitation     yes  Barriers to Discharge None None    Equipment Recommendations  Rolling walker with 5" wheels    Recommendations for Other Services Rehab consult , SLP consult  Frequency Min 4X/week    Precautions / Restrictions Precautions Precautions: Fall;Other (comment) Precaution Comments: NO Bone flap right side of skull, two JP drains, Decreased safety awareness, decreased awareness of deficits   Pertinent Vitals/Pain No reports of pain, see vitals flow sheet for BPs      Mobility  Bed Mobility Supine to Sit: 4: Min assist;With rails Sitting - Scoot to Edge of Bed: 4: Min assist Details for Bed Mobility Assistance: min assist to support trunk while moving on unlevel and compliant mattress, pt managing her own legs Transfers Sit to Stand: 3: Mod assist;With upper extremity assist;From bed;From toilet Stand to Sit: 3: Mod assist;With upper extremity assist;With armrests;To chair/3-in-1;To toilet Details for Transfer Assistance: upon first standing two person assist for safety to get steady on her feet, but  with subsequent stands mod assist to support trunk  over weak legs especially from lower toliet and to control descent to sit.   Ambulation/Gait Ambulation/Gait Assistance: 1: +2 Total assist;3: Mod assist Ambulation/Gait: Patient Percentage: 70% Ambulation Distance (Feet): 50 Feet Assistive device: 2 person hand held assist;1 person hand held assist;Rolling walker Ambulation/Gait Assistance Details: started with 2 person hand held assist to the bathroom pt 70% with right knee buckling for first 3-4 steps.  After toileting, one person hand held assist (mod assist) to support trunk for balance and then switched to RW for gait ~50' in hallway with mod assist for balance, steering of RW and max verbal cues for safe RW use especially when turning around.   Gait Pattern: Step-through pattern;Shuffle;Trunk flexed Gait velocity: less than 1.8 ft/sec indicating risk for recurrent falls General Gait Details: extra person for line management        PT Diagnosis: Difficulty walking;Abnormality of gait;Generalized weakness;Hemiplegia non-dominant side;Altered mental status  PT Problem List: Decreased strength;Decreased activity tolerance;Decreased balance;Decreased mobility;Decreased coordination;Decreased knowledge of use of DME;Decreased safety awareness;Decreased cognition PT Treatment Interventions: DME instruction;Gait training;Stair training;Functional mobility training;Therapeutic activities;Balance training;Therapeutic exercise;Neuromuscular re-education;Cognitive remediation;Patient/family education;Wheelchair mobility training   PT Goals Acute Rehab PT Goals PT Goal Formulation: Patient unable to participate in goal setting Time For Goal Achievement: 08/23/12 Potential to Achieve Goals: Good Pt will go Supine/Side to Sit: with supervision PT Goal: Supine/Side to Sit - Progress: Goal set today Pt will Sit at Edge of Bed: with supervision PT Goal: Sit at Edge Of Bed - Progress: Goal set  today Pt will go Sit to Supine/Side: with supervision PT Goal: Sit to Supine/Side - Progress: Goal  set today Pt will go Sit to Stand: with supervision PT Goal: Sit to Stand - Progress: Goal set today Pt will go Stand to Sit: with supervision PT Goal: Stand to Sit - Progress: Goal set today Pt will Transfer Bed to Chair/Chair to Bed: with supervision PT Transfer Goal: Bed to Chair/Chair to Bed - Progress: Goal set today Pt will Ambulate: 51 - 150 feet;with supervision;with rolling walker PT Goal: Ambulate - Progress: Goal set today  Visit Information  Last PT Received On: 08/09/12 Assistance Needed: +2 (lines) PT/OT Co-Evaluation/Treatment: Yes    Subjective Data  Subjective: When given options pt reports she is at [employer] Patient Stated Goal: Pt wants to go to the bathroom, no LTGs stated due to cognition   Prior Functioning  Home Living Lives With: Son Available Help at Discharge: Family;Available PRN/intermittently Type of Home: House Home Access: Stairs to enter [employer] of Steps: 3-4 Entrance Stairs-Rails: Right Home Layout: One level Bathroom Shower/Tub: Engineer, manufacturing systems: Standard Home Adaptive Equipment: None Additional Comments: patient was ambulating MOD I on d/c 06/23/12 with no DME Prior Function Level of Independence: Independent Able to Take Stairs?: Yes Driving: No Vocation: Retired Musician: Other (comment) (mumbling answers) Dominant Hand: Right    Cognition  Cognition Overall Cognitive Status: Impaired Area of Impairment: Attention;Memory;Problem solving Arousal/Alertness: Lethargic Orientation Level: Disoriented to;Place;Time;Situation Behavior During Session: Flat affect Current Attention Level: Sustained Memory Deficits: no recall of events- did not know she had surgery again, did not know she was in ICU Following Commands: Follows one step commands consistently Safety/Judgement: Decreased  awareness of safety precautions;Impulsive;Decreased safety judgement for tasks assessed;Decreased awareness of need for assistance Awareness of Errors: Assistance required to identify errors made;Assistance required to correct errors made Problem Solving: slow processing Cognition - Other Comments: Pt unable to report correct month of her birthday, but was able to state correct day.  when asked if she was at home, "no", when asked if she was at [employer], "yes".  RN reports earlier she was able to report she was at the hospital.      Extremity/Trunk Assessment Right Lower Extremity Assessment RLE ROM/Strength/Tone: Deficits RLE ROM/Strength/Tone Deficits: functionally assessed right leg at least 3+/5, however when first walking with hand held assit right leg was buckling like it was antalgic and buckled.  This leg also showed signs of increased fatigue at the end of the session (increasead flexion in WB while standing at sink preforming grooming tasks.   Left Lower Extremity Assessment LLE ROM/Strength/Tone: Deficits LLE ROM/Strength/Tone Deficits: functionally assessed left knee 3+/5.     Balance Static Sitting Balance Static Sitting - Balance Support: Bilateral upper extremity supported;Feet supported Static Sitting - Level of Assistance: 5: Stand by assistance Static Sitting - Comment/# of Minutes: sat EOB x 5 mins in prep for standing.   Static Standing Balance Static Standing - Balance Support: Bilateral upper extremity supported Static Standing - Level of Assistance: 1: +2 Total assist;Patient percentage (comment) (70) Static Standing - Comment/# of Minutes: stood with bil hand held assist EOB before starting to walk to bathroom < 1 min Dynamic Standing Balance Dynamic Standing - Balance Support: Left upper extremity supported Dynamic Standing - Level of Assistance: 3: Mod assist;4: Min assist Dynamic Standing - Comments: during functional activity of attmpting clean up after urinating.   Pt unable to realize that she was trying to wipe through her gown.  Needed total assist to successfully complete task of wiping.  Mod assist for balance  while attempting task.  At sink later after walking min assist  for balance while brushing teeth needed for trunk support over weakening right leg and for balance.    End of Session PT - End of Session Activity Tolerance: Patient limited by fatigue Patient left: in chair;with call bell/phone within reach;with chair alarm set;with restraints reapplied Nurse Communication: Other (comment) (questionable need for helmet)    Lurena Joiner B. Medendorp, PT, DPT [phone]   08/09/2012, 10:05 AM

## 2012-08-09 NOTE — Progress Notes (Signed)
PULMONARY  / CRITICAL CARE MEDICINE  Name: April [name] MRN: [id number] DOB: 02-09-1922    ADMISSION DATE:  08/06/2012 CONSULTATION DATE:  08/06/2012  REFERRING MD :  Neurosurgery Newell Coral)  CHIEF COMPLAINT:  Acute respiratory failure.  BRIEF PATIENT DESCRIPTION:  79 yo with recurrent R chronic subdural hematoma s/p evacuation, left hemiparesis and acute encephalopathy.  LINES / TUBES: OETT  3/3 >>> extubated 08/08/2012 OGT  3/3 >>> Foley  3/3 >>>  CULTURES:  ANTIBIOTICS: Ceftriaxone  3/3 x1  SIGNIFICANT EVENTS / STUDIES:  3/3  Admitted with acute encephalopathy.  Head CT >>> Recurrence of R chronic subdural hematoma with significant mass effect and midline shift.  OR >>> evacuation of hematoma.   08/07/2012: Agitated on wake of assessment while on when necessary fentanyl. Does not open eyes spontaneously  08/08/2012: Extubated  SUBJECTIVE/OVERNIGHT/INTERVAL HX 08/09/2012: Walking with physical therapy and doing well    Temp:  [97.4 F (36.3 C)-98.6 F (37 C)] 98.5 F (36.9 C) (03/06 0750) Pulse Rate:  [25-101] 92 (03/06 0933) Resp:  [13-22] 18 (03/06 1000) BP: (150-186)/(52-133) 182/66 mmHg (03/06 1000) SpO2:  [96 %-100 %] 96 % (03/06 0700) FiO2 (%):  [30 %] 30 % (03/05 1100) HEMODYNAMICS:   VENTILATOR SETTINGS: Vent Mode:  [-]  FiO2 (%):  [30 %] 30 % INTAKE / OUTPUT: Intake/Output     03/05 0701 - 03/06 0700 03/06 0701 - 03/07 0700   I.V. (mL/kg) 424.2 (6.4) 30 (0.5)   NG/GT 60    IV Piggyback  315   Total Intake(mL/kg) 484.2 (7.3) 345 (5.2)   Urine (mL/kg/hr) 320 (0.2)    Drains 110 (0.1)    Total Output 430     Net +54.2 +345        Urine Occurrence 3 x    Stool Occurrence 1 x     PHYSICAL EXAMINATION: General:  Appears comfortable,  Neuro: Walking with physical therapy  HEENT:  PERRL, OETT / OGT Cardiovascular:  RRR, no m/r/g Lungs:  CTAB Abdomen:  Soft, nontender, bowel sounds diminished Musculoskeletal:  Residual left hemiparesis Skin:   Intact  LABS:  Recent Labs Lab 08/06/12 1540 08/06/12 2100 08/07/12 0300 08/08/12 0415 08/08/12 1450 08/09/12 0500  HGB 9.2*  --  7.3* 6.5* 9.8*  --   WBC 6.9  --  11.5* 9.5 13.5*  --   PLT 369  --  313 298 339  --   NA 140  --  143 145  --  149*  K 3.2*  --  3.5 3.9  --  3.5  CL 105  --  111 116*  --  117*  CO2 25  --  24 20  --  21  GLUCOSE 150*  --  141* 126*  --  119*  BUN 19  --  23 28*  --  31*  CREATININE 2.02*  --  2.31* 2.37*  --  2.24*  CALCIUM 8.9  --  8.1* 8.0*  --  8.9  MG  --   --   --  1.7  --  1.7  PHOS  --   --   --  3.9  --  3.9  PHART  --  7.337*  --   --   --   --   PCO2ART  --  42.8  --   --   --   --   PO2ART  --  120.0*  --   --   --   --     Recent Labs  Lab 08/08/12 1233 08/08/12 1618 08/08/12 2004 08/09/12 0032 08/09/12 0744  GLUCAP 181* 98 97 107* 112*    Dg Chest Port 1 View  08/08/2012  *RADIOLOGY REPORT*  Clinical Data: Evaluate ET tube  PORTABLE CHEST - 1 VIEW  Comparison: 08/06/2012  Findings: The endotracheal tube tip is above the carina.  The heart size appears normal.  There are bilateral pleural effusions and mild edema, similar to previous exam. Airspace consolidation and atelectasis within the lung bases appear similar to previous exam.  IMPRESSION:  1.  Stable position of ET tube with tip above the carina. 2. No change in aeration the lungs compared with previous exam.   Original Report Authenticated By: Signa Kell, M.D.      ASSESSMENT / PLAN:  PULMONARY  Recent Labs Lab 08/06/12 2100  PHART 7.337*  PCO2ART 42.8  PO2ART 120.0*  HCO3 22.3  TCO2 23.6  O2SAT 99.3    A:  Acute postop respiratory failure.  Bilateral atelectasis.  History of extubation failure secondary to ? cord edema. - 08/08/2012: Extubated  - 08/09/2012: No respiratory issues P:   Pulmonary toilet as needed   CARDIOVASCULAR  No results found for this basename: TROPONINI,  in the last 168 hours No results found for this basename: PROBNP,  in the  last 168 hours  A: DVT s/p IVC filter placement. HTN.  Bradycardia, likely secondary to Coreg / Clonidine - 08/07/2012: Heart rate 60 sinus bradycardia with normal blood pressure  - 08/09/2012: No issues other than high blood pressure last night  PLAN Goal MAP per Neurosurgery < 170  Hold coreg Continue Norvasc, Micardis (caution with renal failure) and clonidine patch: Latter was restarted last night Hydralazine and labetalol PRN  RENAL  Recent Labs Lab 08/06/12 0856 08/06/12 1540 08/07/12 0300 08/08/12 0415 08/09/12 0500  NA 142 140 143 145 149*  K 3.0* 3.2* 3.5 3.9 3.5  CL 105 105 111 116* 117*  CO2 24 25 24 20 21   GLUCOSE 193* 150* 141* 126* 119*  BUN 19 19 23  28* 31*  CREATININE 2.09* 2.02* 2.31* 2.37* 2.24*  CALCIUM 9.0 8.9 8.1* 8.0* 8.9  MG  --   --   --  1.7 1.7  PHOS  --   --   --  3.9 3.9    A:  CKD.  - 08/09/2012: Creatinine is stable but has mild hypomagnesemia and hypokalemia P:   Saline lock IV Replace potassium and magnesium  GASTROINTESTINAL No results found for this basename: AST, ALT, ALKPHOS, BILITOT, PROT, ALBUMIN, INR,  in the last 168 hours  A:  No active issues. P:   Oral diet Stop Protonix  HEMATOLOGIC  Recent Labs Lab 08/07/12 0300 08/08/12 0415 08/08/12 1450  HGB 7.3* 6.5* 9.8*  HCT 22.0* 19.3* 28.7*  WBC 11.5* 9.5 13.5*  PLT 313 298 339    A:  Anemia, acute on chronic (renal disease and acute blood loss). - 08/08/2012: Hemoglobin less than 7 g percent has anemia of critical illness in the status post 1 unit PRBC P:  Trend CBC SCDs for DVT Px - PRBC for hgb </= 6.9gm%    - exceptions are   -  if ACS susepcted/confirmed then transfuse for hgb </= 8.0gm%,  or    -  If septic shock first 24h and scvo2 < 70% then transfuse for hgb </= 9.0gm%   - active bleeding with hemodynamic instability, then transfuse regardless of hemoglobin value   At at all times try to transfuse  1 unit prbc as possible with exception of active  hemorrhage    INFECTIOUS No results found for this basename: LATICACIDVEN, PROCALCITON,  in the last 168 hours  Recent Labs Lab 08/06/12 0856 08/06/12 1540 08/07/12 0300 08/08/12 0415 08/08/12 1450  WBC 6.1 6.9 11.5* 9.5 13.5*    A:  No evidence of acute infection. - 08/09/2012: No fever  P:   No intervention required  ENDOCRINE  A:  DM2, uncontrolled.  Hyperglycemia.  P:   SSI  NEUROLOGIC A:  Recurrent SDH s/p evacuation. - 08/09/2012: Walking with physical therapy   P:   Monitor clinically   Global - 08/09/2012: No family at bedside x  4 days at time of critical care medicine M.D. rounds    Pulmonary critical care medicine will sign off   Dr. Kalman Shan, M.D., [facility].C.P Pulmonary and Critical Care Medicine Staff Physician [facility] Pulmonary and Critical Care Pager: [phone], If no answer or between  15:00h - 7:00h: call [phone]  08/09/2012 10:37 AM

## 2012-08-09 NOTE — Progress Notes (Signed)
Subjective: Patient resting in bed comfortably. Wound healing nicely.  Continued bloody watery drainage into JP drains.  Objective: Vital signs in last 24 hours: Filed Vitals:   08/09/12 0454 08/09/12 0500 08/09/12 0600 08/09/12 0700  BP:  176/72 165/133 167/106  Pulse:  84 89 82  Temp: 97.4 F (36.3 C)     TempSrc: Oral     Resp:  18 20 20   Height:      Weight:      SpO2:  98% 99% 96%    Intake/Output from previous day: 03/05 0701 - 03/06 0700 In: 594.2 [I.V.:424.2; NG/GT:60] Out: 320 [Urine:320] Intake/Output this shift:    Physical Exam:  Awake, following commands. Oriented to name. Moving all 4 extremities, right better than left.  CBC  Recent Labs  08/08/12 0415 08/08/12 1450  WBC 9.5 13.5*  HGB 6.5* 9.8*  HCT 19.3* 28.7*  PLT 298 339   BMET  Recent Labs  08/08/12 0415 08/09/12 0500  NA 145 149*  K 3.9 3.5  CL 116* 117*  CO2 20 21  GLUCOSE 126* 119*  BUN 28* 31*  CREATININE 2.37* 2.24*  CALCIUM 8.0* 8.9    Assessment/Plan: We'll begin out of bed to chair, clear liquids (and resume home meds), check CT brain without in a.m.   [name], MD 08/09/2012, 7:27 AM

## 2012-08-09 NOTE — Progress Notes (Signed)
NUTRITION FOLLOW UP  Intervention:   Resource Breeze po TID, each supplement provides 250 kcal and 9 grams of protein.  Nutrition Dx:   Inadequate oral intake related to inability to eat as evidenced by NPO status. - progressing.    Goal:   Pt to meet >/= 90% of estimated needs - currently unmet  Monitor:   Toleration of diet advancement, weight trends   Assessment:   Pt is 79 yo female with PMH of DM, peripheral neuropathy, HYT, DVT and chronic renal insufficiency.  Pt with chronic subdural hematoma with significant mass effect and midline shift. 2/17 pt underwent first craniotomy for evacuation of a subacute and chronic subdural hematoma. 3/3 Pt admitted in transfer from inpatient rehab. Admitted with acute encephalopathy. Head CT revealed recurrence of R chronic subdural hematoma with significant mass effect and midline shift. 3/3 pt underwent second evacuation of hematoma.   3/4 pt intubated, sedated on propofol and receieved enteral nutrition. Pt extubated 3/5 and is no longer sedated on propofol.   Pt discussed during ICU rounds and with RN.  Per RN pt tolerated liquids well this am, RN to discuss diet advancement with surgeon. Pt confused, in mittens sitting up in chair. Unable to really answer any questions.    Height: Ht Readings from Last 1 Encounters:  08/06/12 5' (1.524 m)    Weight Status:   Wt Readings from Last 1 Encounters:  08/08/12 146 lb 9.7 oz (66.5 kg)  Admission weight 136 lb   Re-estimated needs:  Kcal: 1300-1500  Protein: 75-90 g  Fluid: > 1.5 L/day  Skin: incisions head & abdomen, skin tear left lower leg   Diet Order: Clear Liquid   Intake/Output Summary (Last 24 hours) at 08/09/12 1126 Last data filed at 08/09/12 1000  Gross per 24 hour  Intake 1019.17 ml  Output    110 ml  Net 909.17 ml    Last BM: 3/5   Labs:   Recent Labs Lab 08/07/12 0300 08/08/12 0415 08/09/12 0500  NA 143 145 149*  K 3.5 3.9 3.5  CL 111 116* 117*  CO2  24 20 21   BUN 23 28* 31*  CREATININE 2.31* 2.37* 2.24*  CALCIUM 8.1* 8.0* 8.9  MG  --  1.7 1.7  PHOS  --  3.9 3.9  GLUCOSE 141* 126* 119*    CBG (last 3)   Recent Labs  08/08/12 2004 08/09/12 0032 08/09/12 0744  GLUCAP 97 107* 112*    Scheduled Meds: . amLODipine  10 mg Oral Daily  . atorvastatin  40 mg Oral q1800  . cloNIDine  0.3 mg Transdermal Weekly  . insulin aspart  0-15 Units Subcutaneous Q4H  . irbesartan  300 mg Oral Daily  . levETIRAcetam  500 mg Intravenous Q12H  . magnesium sulfate LVP 250-500 ml  1 g Intravenous Once  . potassium chloride  40 mEq Oral Once    Continuous Infusions: . propofol Stopped (08/08/12 0748)    Kendell Bane RD, LDN, CNSC [phone] Pager [phone] After Hours Pager

## 2012-08-10 ENCOUNTER — Inpatient Hospital Stay (HOSPITAL_COMMUNITY): Payer: Medicare Other

## 2012-08-10 LAB — GLUCOSE, CAPILLARY
Glucose-Capillary: 102 mg/dL — ABNORMAL HIGH (ref 70–99)
Glucose-Capillary: 110 mg/dL — ABNORMAL HIGH (ref 70–99)
Glucose-Capillary: 137 mg/dL — ABNORMAL HIGH (ref 70–99)
Glucose-Capillary: 164 mg/dL — ABNORMAL HIGH (ref 70–99)
Glucose-Capillary: 183 mg/dL — ABNORMAL HIGH (ref 70–99)
Glucose-Capillary: 248 mg/dL — ABNORMAL HIGH (ref 70–99)

## 2012-08-10 LAB — BASIC METABOLIC PANEL
BUN: 29 mg/dL — ABNORMAL HIGH (ref 6–23)
Chloride: 117 mEq/L — ABNORMAL HIGH (ref 96–112)
GFR calc Af Amer: 25 mL/min — ABNORMAL LOW (ref >90)
GFR calc non Af Amer: 21 mL/min — ABNORMAL LOW (ref >90)
Potassium: 3.3 mEq/L — ABNORMAL LOW (ref 3.5–5.1)

## 2012-08-10 LAB — MAGNESIUM: Magnesium: 1.9 mg/dL (ref 1.5–2.5)

## 2012-08-10 MED ORDER — LINAGLIPTIN 5 MG PO TABS
5.0000 mg | ORAL_TABLET | Freq: Every day | ORAL | Status: DC
  Administered 2012-08-10 – 2012-08-13 (×5): 5 mg via ORAL
  Filled 2012-08-10 (×4): qty 1

## 2012-08-10 MED ORDER — INSULIN ASPART 100 UNIT/ML ~~LOC~~ SOLN
0.0000 [IU] | Freq: Every day | SUBCUTANEOUS | Status: DC
  Administered 2012-08-11: 3 [IU] via SUBCUTANEOUS
  Administered 2012-08-12: 2 [IU] via SUBCUTANEOUS

## 2012-08-10 MED ORDER — INSULIN ASPART 100 UNIT/ML ~~LOC~~ SOLN
0.0000 [IU] | Freq: Three times a day (TID) | SUBCUTANEOUS | Status: DC
  Administered 2012-08-10: 5 [IU] via SUBCUTANEOUS
  Administered 2012-08-12 (×2): 3 [IU] via SUBCUTANEOUS
  Administered 2012-08-12: 2 [IU] via SUBCUTANEOUS
  Administered 2012-08-13: 5 [IU] via SUBCUTANEOUS
  Administered 2012-08-13: 3 [IU] via SUBCUTANEOUS

## 2012-08-10 MED ORDER — LEVETIRACETAM ER 500 MG PO TB24
500.0000 mg | ORAL_TABLET | Freq: Two times a day (BID) | ORAL | Status: DC
  Filled 2012-08-10 (×2): qty 1

## 2012-08-10 MED ORDER — LEVETIRACETAM 500 MG PO TABS
500.0000 mg | ORAL_TABLET | Freq: Two times a day (BID) | ORAL | Status: DC
  Filled 2012-08-10 (×2): qty 1

## 2012-08-10 MED ORDER — LEVETIRACETAM 500 MG PO TABS
500.0000 mg | ORAL_TABLET | Freq: Two times a day (BID) | ORAL | Status: DC
  Administered 2012-08-10 – 2012-08-13 (×6): 500 mg via ORAL
  Filled 2012-08-10 (×7): qty 1

## 2012-08-10 NOTE — Progress Notes (Signed)
Physical Therapy Treatment Patient Details Name: April [name] MRN: [id number] DOB: Sep 28, 1921 Today's Date: 08/10/2012 Time: 4098-1191 PT Time Calculation (min): 47 min  PT Assessment / Plan / Recommendation Comments on Treatment Session  79 y.o. female admitted to [facility] with increased weakness and confusion. Chronic right SDH was determined to be getting worse, so SDH evacuation and crainiotomy completed.  She was admitted to CIR and then had to return to acute care for a repeat evacuation of SDH.  She now is missing a bone flap on her right side of her skull.      Follow Up Recommendations  CIR     Does the patient have the potential to tolerate intense rehabilitation    yes  Barriers to Discharge   none      Equipment Recommendations  Rolling walker with 5" wheels    Recommendations for Other Services   none  Frequency Min 4X/week   Plan Discharge plan remains appropriate;Frequency remains appropriate    Precautions / Restrictions Precautions Precautions: Fall;Other (comment) Precaution Comments: NO Bone flap right side of skull, two JP drains, Decreased safety awareness, decreased awareness of deficits   Pertinent Vitals/Pain No reports of pain, see vitals flow sheet for vitals    Mobility  Bed Mobility Supine to Sit: 3: Mod assist;HOB flat Details for Bed Mobility Assistance: mod assist to support trunk to get to sitting.  Increased assist today due to increased lethargy Transfers Sit to Stand: 4: Min assist;With upper extremity assist;From bed Stand to Sit: 4: Min assist;With upper extremity assist;To chair/3-in-1;With armrests Details for Transfer Assistance: min assist to stand from elevated bed to RW.  Min assist to help control descent to sit safely Ambulation/Gait Ambulation/Gait Assistance: 4: Min assist Ambulation Distance (Feet): 150 Feet Assistive device: Rolling walker Ambulation/Gait Assistance Details: min assist mainly to support trunk for balance due to  right knee still a little weak and antalgic gait pattern. Most assist needed to steer RW around obstacles with max verbal cues for safe use of RW and obstacle identification.   Gait Pattern: Step-through pattern     PT Goals Acute Rehab PT Goals PT Goal: Supine/Side to Sit - Progress: Progressing toward goal PT Goal: Sit at Edge Of Bed - Progress: Met PT Goal: Sit to Stand - Progress: Progressing toward goal PT Goal: Stand to Sit - Progress: Progressing toward goal PT Goal: Ambulate - Progress: Progressing toward goal  Visit Information  Last PT Received On: 08/10/12 Assistance Needed: +1    Subjective Data  Subjective: Pt not very awake during treatment today.     Cognition  Cognition Overall Cognitive Status: Impaired Area of Impairment: Attention;Memory;Problem solving;Awareness of deficits Arousal/Alertness: Lethargic Orientation Level: Situation;Time;Person;Disoriented to Behavior During Session: Flat affect Current Attention Level: Sustained Memory Deficits: no recall of events- did not know she had surgery again, did not know she was in ICU Following Commands: Follows one step commands consistently;Follows one step commands with increased time Safety/Judgement: Decreased awareness of safety precautions;Impulsive;Decreased safety judgement for tasks assessed;Decreased awareness of need for assistance Awareness of Errors: Assistance required to identify errors made;Assistance required to correct errors made Problem Solving: slow processing Cognition - Other Comments: Pt did report she was at [facility] today.  She has difficulty realizing errors (driving RW into obstacles, unable to self correct)    Balance  Static Sitting Balance Static Sitting - Balance Support: Bilateral upper extremity supported;Feet supported Static Sitting - Level of Assistance: 5: Stand by assistance Static Sitting - Comment/# of  Minutes: sat EOB x 5 mins in preparation for standing.   Static  Standing Balance Static Standing - Balance Support: Bilateral upper extremity supported Static Standing - Level of Assistance: 4: Min assist Static Standing - Comment/# of Minutes: min assist EOB with RW.    End of Session PT - End of Session Equipment Utilized During Treatment: Gait belt Activity Tolerance: Patient limited by fatigue Patient left: in chair;with call bell/phone within reach;with nursing in room Nurse Communication: Mobility status     Lurena Joiner B. Medendorp, PT, DPT [phone]   08/10/2012, 10:07 AM

## 2012-08-10 NOTE — Progress Notes (Signed)
I met with pt's son at bedside yesterday afternoon. He is adament that he can provide the needed 24/7 supervision that pt will require at home with himself and his 3 daughters. Does not want pt to return to [facility] or for SNF in . I will follow her progress and we await medical readiness to return to IP rehab next week. Would recommend ordering protective helmet for pt when she is up due to no Bone Flap .[phone]

## 2012-08-10 NOTE — Progress Notes (Signed)
Subjective: Patient sitting up in chair, without complaints. CT scan brain without contrast this morning shows small amount of residual right hemispheric subdural collection. Mass effect and shift continue to be improved. Drains putting out mildly bloody CSF.  Objective: Vital signs in last 24 hours: Filed Vitals:   08/10/12 1300 08/10/12 1315 08/10/12 1330 08/10/12 1345  BP: 176/62 148/59 139/69 167/66  Pulse: 76 75 78 88  Temp:      TempSrc:      Resp: 18 18 16 21   Height:      Weight:      SpO2: 100% 100% 100% 100%    Intake/Output from previous day: 03/06 0701 - 03/07 0700 In: 945 [P.O.:500; I.V.:130; IV Piggyback:315] Out: 150 [Drains:150] Intake/Output this shift: Total I/O In: 220 [P.O.:120; IV Piggyback:100] Out: 470 [Urine:400; Drains:70]  Physical Exam:  Awake and alert, oriented to name. Following commands. Moving all 4 extremities. Moving right side better than left side. Wound healing well, scalp flap mildly sunken, pulsating; no erythema, swelling, or drainage.  CBC  Recent Labs  08/08/12 0415 08/08/12 1450  WBC 9.5 13.5*  HGB 6.5* 9.8*  HCT 19.3* 28.7*  PLT 298 339   BMET  Recent Labs  08/09/12 0500 08/10/12 0515  NA 149* 148*  K 3.5 3.3*  CL 117* 117*  CO2 21 22  GLUCOSE 119* 110*  BUN 31* 29*  CREATININE 2.24* 1.99*  CALCIUM 8.9 9.0    Studies/Results: Ct Head Wo Contrast  08/10/2012  *RADIOLOGY REPORT*  Clinical Data: Followup subdural hematoma.  CT HEAD WITHOUT CONTRAST  Technique:  Contiguous axial images were obtained from the base of the skull through the vertex without contrast.  Comparison: 08/07/2012.  Findings: Post right frontal craniotomy with placement of 2 drainage catheters.  Slight increase in amount of pneumocephalus anterior right frontal region.  Decrease in size of slightly complex right-sided subdural collection.  The residual subdural collection with maximal thickness 8.8 mm.  Mild local mass effect with decreased midline  shift to the left.  Small vessel disease type changes without CT evidence of large acute thrombotic infarct.  Baseline atrophy without hydrocephalus.  No intracranial mass lesion detected on this unenhanced exam.  IMPRESSION: Slight decrease in size of right-sided subdural hematoma with minimal increase in amount of pneumocephalus.  Decrease midline shift to the left.   Original Report Authenticated By: Lacy Duverney, M.D.     Assessment/Plan: Continues to improve neurologically. CT scan also looks better. Taking well by mouth. We'll change Keppra from IV to by mouth.  Both drains removed without difficulty. Sterile dressing applied. We'll recheck CT brain without on Monday, March 10 a.m.  Spoke with the patient's son yesterday and spoke with the patient's daughter (from [place]) today, who is at bedside. Reviewed patient's current condition and plans for treatment and care.  Anticipate transfer to [facility] health rehabilitation center on Monday, March 10, unless problems arise. Continuing PT and OT.   [name], MD 08/10/2012, 2:31 PM

## 2012-08-10 NOTE — PMR Pre-admission (Signed)
PMR Admission Coordinator Pre-Admission Assessment  Patient: April [name] is an 79 y.o., female MRN: [id number] DOB: Feb 06, 1922 Height: 5' (152.4 cm) Weight: 66.5 kg (146 lb 9.7 oz)              Insurance Information HMO:     PPO:      PCP:      IPA:      80/20: yes     OTHER: no HMO PRIMARY: Medicare a and b      Policy#: [id number]      Subscriber: pt Benefits:  Phone #: [email or web address]     Name: 07/20/12 Eff. Date: A 06/06/90 B 12/05/94     Deduct: $1216      Out of Pocket Max: none      Life Max: none CIR: 100%      SNF: 20 full days LBD 07/20/12 Outpatient: 80%     Co-Pay: 20% Home Health: 100%      Co-Pay: none DME: 80%     Co-Pay: 20% Providers: pt choice  SECONDARY: [employer]      Policy#: [id number]      Subscriber: pt No auth requird with Medicare primary  THIRD: [place] [employer] [place]: [id number]  Medicaid Application Date:       Case Manager:   Emergency Contact Information Contact Information   Name Relation Home Work Mobile   [place] Son [phone]  [phone]   Larey Dresser Daughter [phone]  [phone]     Current Medical History  Patient Admitting Diagnosis: Recurrent R chronic subdural hematoma s/p evacuation  History of Present Illness: .  Originally admitted to [facility] on 07/23/12 with noted history of large right hemispheric chronic subdural hematoma. She underwent a right frontoparietal craniotomy evacuation on 07/23/12. Placed on Keppra for seizure prophylaxis. CT scan on 07/30/12 showed increase in size of right sided SDH as well as slight increase in size of blood collection. Neurosurgery felt pt stable and could be admitted ti IP rehab on 07/30/12. Over the past week, patient had become more lethargic and less ability in attending therapies. Neurosurgery made aware and repeat CT scan 08/06/12. CT scan with significant recurrence of SDH, with significant mass effect and midline shift. Dr. Jule Ser readmitted pt to acute 08/06/12 and returned to OR for evacuation of  SDH and implantation of bone flap into abdominal wall.  08/07/12 agitated on wake assessment and remained on vent postop. 08/08/12 Extubated. Bloody drainage into two JP drains. Up with therapy in room on 08/09/12.  Past Medical History  Past Medical History  Diagnosis Date  . Diabetes mellitus   . Hypertension   . Hyperlipidemia   . Pneumonia   . Subdural hematoma   . Arthritis   . DVT (deep venous thrombosis)   . Incontinence   . Renal disorder     chronic kidney dz stage III   Family History  family history includes Diabetes Mellitus II in her son.  Prior Rehab/Hospitalizations: CIR 07/30/12 through 08/06/12 and readmitted to acute hospital.   Current Medications  Current facility-administered medications:acetaminophen (TYLENOL) tablet 325-650 mg, 325-650 mg, Oral, Q4H PRN, [name], MD;  amLODipine (NORVASC) tablet 10 mg, 10 mg, Oral, Daily, [name], MD, 10 mg at 08/12/12 1045;  atorvastatin (LIPITOR) tablet 40 mg, 40 mg, Oral, q1800, [name], MD, 40 mg at 08/12/12 1826;  bisacodyl (DULCOLAX) suppository 10 mg, 10 mg, Rectal, Daily PRN, [name], MD cloNIDine (CATAPRES - Dosed in mg/24 hr) patch 0.3 mg, 0.3 mg,  Transdermal, Weekly, Oretha Milch, MD, 0.3 mg at 08/08/12 2205;  feeding supplement (RESOURCE BREEZE) liquid 1 Container, 1 Container, Oral, TID BM, Heather Cornelison Pitts, RD, 1 Container at 08/12/12 1941;  insulin aspart (novoLOG) injection 0-15 Units, 0-15 Units, Subcutaneous, TID WC, [name], MD, 3 Units at 08/13/12 4098 insulin aspart (novoLOG) injection 0-5 Units, 0-5 Units, Subcutaneous, QHS, [name], MD, 2 Units at 08/12/12 2228;  irbesartan (AVAPRO) tablet 300 mg, 300 mg, Oral, Daily, [name], MD, 300 mg at 08/12/12 1045;  labetalol (NORMODYNE,TRANDATE) injection 5-10 mg, 5-10 mg, Intravenous, Q1H PRN, Maeola Harman, MD, 10 mg at 08/13/12 1191 levETIRAcetam (KEPPRA) tablet 500 mg, 500 mg, Oral, BID, [name], MD, 500 mg at 08/12/12 2227;  linagliptin (TRADJENTA) tablet 5 mg, 5 mg, Oral, Q1200, [name], MD, 5 mg at 08/12/12 1247;  magnesium hydroxide (MILK OF MAGNESIA) suspension 30 mL, 30 mL, Oral, Daily PRN, [name], MD  Patients Current Diet: Carb Control  Precautions / Restrictions Precautions Precautions: Fall;Other (comment) Precaution Comments: NO Bone flap right side of skull, two JP drains, Decreased safety awareness, decreased awareness of deficits Restrictions Weight Bearing Restrictions: No   Prior Activity Level Patient was living in a senior apartment with a nephew living with her until 06/08/2012. Daughter Dondra Spry, states she checked on her Mom frequently and pt was doing well. Son, [name] came 06/08/2012 and pt moved with him to [place] to stay with him. He states pt was not receiving the care and attention she needed in [place] therefore he had visited her 14 times over the past year and pt decided she would move with him to [place] to stay  Home Assistive Devices / Equipment Home Assistive Devices/Equipment: None Home Adaptive Equipment: None Wheelchair, shower chair with back  Prior Functional Level Prior Function Level of Independence: Independent Able to Take Stairs?: Yes Driving: No Vocation: Retired  Current Functional Level Cognition  Arousal/Alertness: Lethargic Overall Cognitive Status: Impaired Current Attention Level: Sustained Memory Deficits: no recall of events- did not know she had surgery again, did not know she was in ICU Orientation Level: Oriented to person;Oriented to place;Disoriented to time;Disoriented to situation Following Commands: Follows one step commands consistently;Follows one step commands with increased time Safety/Judgement: Decreased awareness of safety precautions;Impulsive;Decreased safety judgement for tasks assessed;Decreased awareness of need for assistance Awareness of Errors: Assistance required to identify errors  made;Assistance required to correct errors made Cognition - Other Comments: Pt did report she was at [facility] cone today.  She has difficulty realizing errors (driving RW into obstacles, unable to self correct)    Extremity Assessment (includes Sensation/Coordination)  RUE ROM/Strength/Tone: Deficits;Due to impaired cognition RUE ROM/Strength/Tone Deficits: 4 out 5 and edeam noted at hand RUE Sensation: WFL - Light Touch RUE Coordination: WFL - gross motor  RLE ROM/Strength/Tone: Deficits RLE ROM/Strength/Tone Deficits: functionally assessed right leg at least 3+/5, however when first walking with hand held assit right leg was buckling like it was antalgic and buckled.  This leg also showed signs of increased fatigue at the end of the session (increasead flexion in WB while standing at sink preforming grooming tasks.      ADLs  Eating/Feeding: Maximal assistance;Min guard Where Assessed - Eating/Feeding: Chair (see OT note session) Grooming: Wash/dry face;Min guard Where Assessed - Grooming: Supported sitting Lower Body Dressing: +1 Total assistance Where Assessed - Lower Body Dressing: Supported standing Toilet Transfer: Moderate assistance Toilet Transfer Method: Sit to stand Toilet Transfer Equipment: Regular  height toilet;Grab bars Toileting - Clothing Manipulation and Hygiene: +1 Total assistance Where Assessed - Toileting Clothing Manipulation and Hygiene: Sit to stand from 3-in-1 or toilet Equipment Used: Gait belt;Rolling walker Transfers/Ambulation Related to ADLs: Pt ambulating with RW using the RW too far in advance with therapist providing max (A) to keep walker close for support. Pt needed cueing to maintain attention to task ADL Comments: Pt pleasant and immediately attempting to exit bed on Right side. Pt could benefit from helmet due to poor cognition and mobility to roll onto Rt side of skull without bone flap. Pt required (A) to initiate all task this session. Pt standing at  sink holding tooth brush with visual auditory and tactile cue to brush teeth. Pt continued task once assisted with starting. Pt when asked to open mouth barely opening mouth but demonstrates during session ability to open wider. Pt with whiteness noted on tongue and risk for thrush due to poor oral hygiene. Pt assisted with oral care for hygiene detail max (A). pt eating jello and broth with hand over hand max (A) at the start of the meal. PT progressed to min guard (A) with repetition and auditory cues. Pt with poor grasp and edema noted in bil hands. pt with eyes watering bil    Mobility  Supine to Sit: 3: Mod assist;HOB flat Sitting - Scoot to Edge of Bed: 4: Min assist    Transfers  Sit to Stand: 4: Min assist;With upper extremity assist;From bed Stand to Sit: 4: Min assist;With upper extremity assist;To chair/3-in-1;With armrests    Ambulation / Gait / Stairs / Wheelchair Mobility  Ambulation/Gait Ambulation/Gait Assistance: 4: Min assist Ambulation/Gait: Patient Percentage: 70% Ambulation Distance (Feet): 150 Feet Assistive device: Rolling walker Ambulation/Gait Assistance Details: min assist mainly to support trunk for balance due to right knee still a little weak and antalgic gait pattern. Most assist needed to steer RW around obstacles with max verbal cues for safe use of RW and obstacle identification.   Gait Pattern: Step-through pattern Gait velocity: less than 1.8 ft/sec indicating risk for recurrent falls General Gait Details: extra person for line management    Posture / Balance Static Sitting Balance Static Sitting - Balance Support: Bilateral upper extremity supported;Feet supported Static Sitting - Level of Assistance: 5: Stand by assistance Static Sitting - Comment/# of Minutes: sat EOB x 5 mins in preparation for standing.   Static Standing Balance Static Standing - Balance Support: Bilateral upper extremity supported Static Standing - Level of Assistance: 4: Min  assist Static Standing - Comment/# of Minutes: min assist EOB with RW.   Dynamic Standing Balance Dynamic Standing - Balance Support: Left upper extremity supported Dynamic Standing - Level of Assistance: 3: Mod assist;4: Min assist Dynamic Standing - Comments: during functional activity of attmpting clean up after urinating.  Pt unable to realize that she was trying to wipe through her gown.  Needed total assist to successfully complete task of wiping.  Mod assist for balance while attempting task.  At sink later after walking min assist  for balance while brushing teeth needed for trunk support over weakening right leg and for balance.      Special needs/care consideration No Bone Flap skull on right. Bone flap in abdominal wall  Bowel mgmt : incontinent at times Bladder mgmt: incontinent at times Diabetic mgmt HGB A1C on 06/21/12 was 16.1. NO PCP pta in [place] since moving from [facility] 06/08/12    Previous Home Environment Living Arrangements: Children Lives With: Son  Available Help at Discharge: Family;Available PRN/intermittently Type of Home: House Home Layout: One level Home Access: Stairs to enter Entrance Stairs-Rails: Right Entrance Stairs-Number of Steps: 3-4 Bathroom Shower/Tub: Engineer, manufacturing systems: Standard Home Care Services: No Additional Comments: patient was ambulating MOD I on d/c 06/23/12 with no DME Had moved 06/08/12 from [place] with son. Eric.  Discharge Living Setting Plans for Discharge Living Setting: Lives with (comment) (son)  Type of Home at Discharge: House  Discharge Home Layout: One level  Discharge Home Access: Stairs to enter  Entrance Stairs-Rails: Right  Entrance Stairs-Number of Steps: 3 to 4  Discharge Bathroom Shower/Tub: Tub/shower unit  Discharge Bathroom Toilet: Standard  Discharge Bathroom Accessibility: Yes  How Accessible: Accessible via walker  Do you have any problems obtaining your medications?: No  Son wishes for pt to return home with  him in [place] at d/c. He feels she was not getting attention she needed in [place]. Daughter, Dondra Spry, wishes for pt to move back to [facility] and live with her. Dondra Spry states she will do what her Mom wishes. Both [name] and Dondra Spry want to be involved in d/c planning.  08/09/12 [name] still feels he can arrange 24/7 supervision for pt at d/c.  Social/Family/Support Systems   Patient Roles: Parent  Contact Information: Magdelene Ruark, son  Anticipated Caregiver: son  Anticipated Caregiver's Contact Information: home [phone]  Ability/Limitations of Caregiver: none  Caregiver Availability: 24/7  Discharge Plan Discussed with Primary Caregiver: Yes  Is Caregiver In Agreement with Plan?: Yes  Does Caregiver/Family have Issues with Lodging/Transportation while Pt is in Rehab?: No  Larey Dresser, daughter in [place] wishes to be involved in d/c plans also. Patient did not have PCP in [place] since move. SW, Macario Golds, has arranged for PCP at [facility] with first appointment 08/15/12 at 10 am.  Shari Heritage , [name], is a Curator, who works from his home. States he can give Mom 24/7 supervision.   Goals/Additional Needs Expected length of stay: ELOS 10 to 14 days Patient/Family Goal for Rehab: supervision PT, supervision OT, supervision SLP   Dietary Needs: Dysphagia 3 diet with thin liquids  Pt/Family Agrees to Admission and willing to participate: Yes  Program Orientation Provided & Reviewed with Pt/Caregiver Including Roles & Responsibilities: Yes   Decrease burden of Care through IP rehab admission:n/a  Possible need for SNF placement upon discharge: [name] on 08/09/12 adamently refuses SNF placement for his Mom. Says he can and will provide 24/7 supervision at his home.   Patient Condition:  Patient was seen by Dr. Riley Kill this morning at 0900.  He has approved inpatient rehab admission for today, 08/13/12.    Preadmission Screen Completed By:  Trish Mage, 08/13/2012 9:20  AM ______________________________________________________________________   Discussed status with Dr. Riley Kill on 08/13/12 at 0900 and received telephone approval for admission today.  Admission Coordinator:  Trish Mage, time0918/Date03/10/14

## 2012-08-11 LAB — BASIC METABOLIC PANEL
CO2: 21 mEq/L (ref 19–32)
Chloride: 117 mEq/L — ABNORMAL HIGH (ref 96–112)
Creatinine, Ser: 1.94 mg/dL — ABNORMAL HIGH (ref 0.50–1.10)
Sodium: 146 mEq/L — ABNORMAL HIGH (ref 135–145)

## 2012-08-11 LAB — GLUCOSE, CAPILLARY
Glucose-Capillary: 162 mg/dL — ABNORMAL HIGH (ref 70–99)
Glucose-Capillary: 151 mg/dL — ABNORMAL HIGH (ref 70–99)
Glucose-Capillary: 258 mg/dL — ABNORMAL HIGH (ref 70–99)
Glucose-Capillary: 265 mg/dL — ABNORMAL HIGH (ref 70–99)

## 2012-08-11 MED ORDER — LABETALOL HCL 5 MG/ML IV SOLN
5.0000 mg | INTRAVENOUS | Status: DC | PRN
  Administered 2012-08-11: 5 mg via INTRAVENOUS
  Administered 2012-08-12 – 2012-08-13 (×2): 10 mg via INTRAVENOUS
  Administered 2012-08-13: 5 mg via INTRAVENOUS
  Administered 2012-08-13 (×2): 10 mg via INTRAVENOUS
  Filled 2012-08-11 (×6): qty 4

## 2012-08-11 NOTE — Progress Notes (Signed)
Subjective: Patient resting in bed, awake and alert. Comfortable.  Objective: Vital signs in last 24 hours: Filed Vitals:   08/11/12 0500 08/11/12 0512 08/11/12 0600 08/11/12 0700  BP: 175/61 174/90 173/87 163/61  Pulse: 82 81 82 69  Temp:      TempSrc:      Resp: 17 19 14 22   Height:      Weight:      SpO2: 100% 100% 100% 99%    Intake/Output from previous day: 03/07 0701 - 03/08 0700 In: 340 [P.O.:240; IV Piggyback:100] Out: 795 [Urine:725; Drains:70] Intake/Output this shift:    Physical Exam:  Following commands. Moving all 4 extremities, right greater than left. Flap sunken.  CBC  Recent Labs  08/08/12 1450  WBC 13.5*  HGB 9.8*  HCT 28.7*  PLT 339   BMET  Recent Labs  08/10/12 0515 08/11/12 0430  NA 148* 146*  K 3.3* 3.1*  CL 117* 117*  CO2 22 21  GLUCOSE 110* 148*  BUN 29* 27*  CREATININE 1.99* 1.94*  CALCIUM 9.0 8.6     Assessment/Plan: Continue PT and OT. For transfer to [facility] health inpatient rehabilitation. Every other staple to be discontinued today.   [name], MD 08/11/2012, 7:50 AM

## 2012-08-12 LAB — GLUCOSE, CAPILLARY
Glucose-Capillary: 137 mg/dL — ABNORMAL HIGH (ref 70–99)
Glucose-Capillary: 161 mg/dL — ABNORMAL HIGH (ref 70–99)
Glucose-Capillary: 169 mg/dL — ABNORMAL HIGH (ref 70–99)
Glucose-Capillary: 212 mg/dL — ABNORMAL HIGH (ref 70–99)

## 2012-08-12 LAB — BASIC METABOLIC PANEL
Calcium: 8.3 mg/dL — ABNORMAL LOW (ref 8.4–10.5)
GFR calc non Af Amer: 21 mL/min — ABNORMAL LOW (ref >90)
Sodium: 144 mEq/L (ref 135–145)

## 2012-08-12 LAB — TYPE AND SCREEN

## 2012-08-12 MED ORDER — POTASSIUM CHLORIDE CRYS ER 20 MEQ PO TBCR
EXTENDED_RELEASE_TABLET | ORAL | Status: AC
  Filled 2012-08-12: qty 2

## 2012-08-12 MED ORDER — POTASSIUM CHLORIDE 10 MEQ/100ML IV SOLN: 10.0000 meq | INTRAVENOUS | Status: DC

## 2012-08-12 MED ORDER — POTASSIUM CHLORIDE CRYS ER 20 MEQ PO TBCR
30.0000 meq | EXTENDED_RELEASE_TABLET | Freq: Once | ORAL | Status: AC
  Administered 2012-08-12: 30 meq via ORAL
  Filled 2012-08-12: qty 1

## 2012-08-12 NOTE — Progress Notes (Signed)
Subjective: Patient reports doing well  Objective: Vital signs in last 24 hours: Temp:  [97.8 F (36.6 C)-99 F (37.2 C)] 98.4 F (36.9 C) (03/09 0340) Pulse Rate:  [69-87] 75 (03/09 0400) Resp:  [0-25] 23 (03/09 0400) BP: (145-179)/(61-136) 167/67 mmHg (03/09 0400) SpO2:  [96 %-100 %] 100 % (03/09 0400)  Intake/Output from previous day: 03/08 0701 - 03/09 0700 In: 560 [P.O.:560] Out: 600 [Urine:600] Intake/Output this shift: Total I/O In: 240 [P.O.:240] Out: -   Physical Exam: Awake, alert, conversant.  MAEW without drift.  Flap sunken.  Lab Results: No results found for this basename: WBC, HGB, HCT, PLT,  in the last 72 hours BMET  Recent Labs  08/10/12 0515 08/11/12 0430  NA 148* 146*  K 3.3* 3.1*  CL 117* 117*  CO2 22 21  GLUCOSE 110* 148*  BUN 29* 27*  CREATININE 1.99* 1.94*  CALCIUM 9.0 8.6    Studies/Results: Ct Head Wo Contrast  08/10/2012  *RADIOLOGY REPORT*  Clinical Data: Followup subdural hematoma.  CT HEAD WITHOUT CONTRAST  Technique:  Contiguous axial images were obtained from the base of the skull through the vertex without contrast.  Comparison: 08/07/2012.  Findings: Post right frontal craniotomy with placement of 2 drainage catheters.  Slight increase in amount of pneumocephalus anterior right frontal region.  Decrease in size of slightly complex right-sided subdural collection.  The residual subdural collection with maximal thickness 8.8 mm.  Mild local mass effect with decreased midline shift to the left.  Small vessel disease type changes without CT evidence of large acute thrombotic infarct.  Baseline atrophy without hydrocephalus.  No intracranial mass lesion detected on this unenhanced exam.  IMPRESSION: Slight decrease in size of right-sided subdural hematoma with minimal increase in amount of pneumocephalus.  Decrease midline shift to the left.   Original Report Authenticated By: Lacy Duverney, M.D.     Assessment/Plan: To Rehab soon.  May  transfer to floor today due to bed availability.    LOS: 6 days    Dorian Heckle, MD 08/12/2012, 5:19 AM

## 2012-08-12 NOTE — Progress Notes (Signed)
eLink Physician-Brief Progress Note Patient Name: April [name] DOB: [birth year] MRN: [id number]  Date of Service  08/12/2012   HPI/Events of Note     eICU Interventions  Hypokalemia, repleted Cr 2 noted      MCQUAID, DOUGLAS 08/12/2012, 6:06 AM

## 2012-08-13 ENCOUNTER — Inpatient Hospital Stay (HOSPITAL_COMMUNITY)
Admission: RE | Admit: 2012-08-13 | Discharge: 2012-08-18 | DRG: 945 | Disposition: A | Discharge status: Home Health | Payer: Medicare Other | Source: Intra-hospital | Attending: Physical Medicine & Rehabilitation | Admitting: Physical Medicine & Rehabilitation

## 2012-08-13 ENCOUNTER — Inpatient Hospital Stay (HOSPITAL_COMMUNITY): Payer: Medicare Other

## 2012-08-13 DIAGNOSIS — W19XXXA Unspecified fall, initial encounter: Secondary | ICD-10-CM

## 2012-08-13 DIAGNOSIS — I6200 Nontraumatic subdural hemorrhage, unspecified: Secondary | ICD-10-CM

## 2012-08-13 DIAGNOSIS — N183 Chronic kidney disease, stage 3 unspecified: Secondary | ICD-10-CM

## 2012-08-13 DIAGNOSIS — E876 Hypokalemia: Secondary | ICD-10-CM

## 2012-08-13 DIAGNOSIS — E1165 Type 2 diabetes mellitus with hyperglycemia: Secondary | ICD-10-CM

## 2012-08-13 DIAGNOSIS — E1149 Type 2 diabetes mellitus with other diabetic neurological complication: Secondary | ICD-10-CM

## 2012-08-13 DIAGNOSIS — M129 Arthropathy, unspecified: Secondary | ICD-10-CM

## 2012-08-13 DIAGNOSIS — I10 Essential (primary) hypertension: Secondary | ICD-10-CM

## 2012-08-13 DIAGNOSIS — Z9889 Other specified postprocedural states: Secondary | ICD-10-CM

## 2012-08-13 DIAGNOSIS — Z794 Long term (current) use of insulin: Secondary | ICD-10-CM

## 2012-08-13 DIAGNOSIS — Z86718 Personal history of other venous thrombosis and embolism: Secondary | ICD-10-CM

## 2012-08-13 DIAGNOSIS — E1142 Type 2 diabetes mellitus with diabetic polyneuropathy: Secondary | ICD-10-CM

## 2012-08-13 DIAGNOSIS — Z79899 Other long term (current) drug therapy: Secondary | ICD-10-CM

## 2012-08-13 DIAGNOSIS — S065X9A Traumatic subdural hemorrhage with loss of consciousness of unspecified duration, initial encounter: Secondary | ICD-10-CM

## 2012-08-13 DIAGNOSIS — E785 Hyperlipidemia, unspecified: Secondary | ICD-10-CM

## 2012-08-13 DIAGNOSIS — I129 Hypertensive chronic kidney disease with stage 1 through stage 4 chronic kidney disease, or unspecified chronic kidney disease: Secondary | ICD-10-CM

## 2012-08-13 DIAGNOSIS — M1120 Other chondrocalcinosis, unspecified site: Secondary | ICD-10-CM

## 2012-08-13 DIAGNOSIS — Z5189 Encounter for other specified aftercare: Principal | ICD-10-CM

## 2012-08-13 LAB — BASIC METABOLIC PANEL WITH GFR
BUN: 24 mg/dL — ABNORMAL HIGH (ref 6–23)
CO2: 21 meq/L (ref 19–32)
Calcium: 8.4 mg/dL (ref 8.4–10.5)
Chloride: 109 meq/L (ref 96–112)
Creatinine, Ser: 1.88 mg/dL — ABNORMAL HIGH (ref 0.50–1.10)
GFR calc Af Amer: 26 mL/min — ABNORMAL LOW
GFR calc non Af Amer: 23 mL/min — ABNORMAL LOW
Glucose, Bld: 194 mg/dL — ABNORMAL HIGH (ref 70–99)
Potassium: 3.2 meq/L — ABNORMAL LOW (ref 3.5–5.1)
Sodium: 141 meq/L (ref 135–145)

## 2012-08-13 LAB — GLUCOSE, CAPILLARY
Glucose-Capillary: 168 mg/dL — ABNORMAL HIGH (ref 70–99)
Glucose-Capillary: 202 mg/dL — ABNORMAL HIGH (ref 70–99)
Glucose-Capillary: 231 mg/dL — ABNORMAL HIGH (ref 70–99)
Glucose-Capillary: 215 mg/dL — ABNORMAL HIGH (ref 70–99)

## 2012-08-13 MED ORDER — AMLODIPINE BESYLATE 10 MG PO TABS
10.0000 mg | ORAL_TABLET | Freq: Every day | ORAL | Status: DC
  Administered 2012-08-14 – 2012-08-18 (×5): 10 mg via ORAL
  Filled 2012-08-13 (×7): qty 1

## 2012-08-13 MED ORDER — INSULIN ASPART 100 UNIT/ML ~~LOC~~ SOLN: 0.0000 [IU] | Freq: Three times a day (TID) | SUBCUTANEOUS | Status: DC

## 2012-08-13 MED ORDER — LEVETIRACETAM 500 MG PO TABS
500.0000 mg | ORAL_TABLET | Freq: Two times a day (BID) | ORAL | Status: DC
  Administered 2012-08-13 – 2012-08-18 (×10): 500 mg via ORAL
  Filled 2012-08-13 (×13): qty 1

## 2012-08-13 MED ORDER — ATORVASTATIN CALCIUM 40 MG PO TABS
40.0000 mg | ORAL_TABLET | Freq: Every day | ORAL | Status: DC
  Administered 2012-08-13: 40 mg via ORAL
  Filled 2012-08-13 (×2): qty 1

## 2012-08-13 MED ORDER — BISACODYL 10 MG RE SUPP: 10.0000 mg | Freq: Every day | RECTAL | Status: DC | PRN

## 2012-08-13 MED ORDER — CLONIDINE HCL 0.3 MG/24HR TD PTWK
0.3000 mg | MEDICATED_PATCH | TRANSDERMAL | Status: DC
  Administered 2012-08-15: 0.3 mg via TRANSDERMAL
  Filled 2012-08-13: qty 1

## 2012-08-13 MED ORDER — IRBESARTAN 300 MG PO TABS
300.0000 mg | ORAL_TABLET | Freq: Every day | ORAL | Status: DC
  Administered 2012-08-14 – 2012-08-18 (×5): 300 mg via ORAL
  Filled 2012-08-13 (×7): qty 1

## 2012-08-13 MED ORDER — INSULIN ASPART 100 UNIT/ML ~~LOC~~ SOLN
0.0000 [IU] | Freq: Three times a day (TID) | SUBCUTANEOUS | Status: DC
  Administered 2012-08-13: 5 [IU] via SUBCUTANEOUS
  Administered 2012-08-14: 3 [IU] via SUBCUTANEOUS
  Administered 2012-08-14: 5 [IU] via SUBCUTANEOUS
  Administered 2012-08-14: 3 [IU] via SUBCUTANEOUS
  Administered 2012-08-15: 5 [IU] via SUBCUTANEOUS
  Administered 2012-08-15: 11 [IU] via SUBCUTANEOUS
  Administered 2012-08-15: 5 [IU] via SUBCUTANEOUS
  Administered 2012-08-16: 8 [IU] via SUBCUTANEOUS
  Administered 2012-08-16: 5 [IU] via SUBCUTANEOUS
  Administered 2012-08-16: 8 [IU] via SUBCUTANEOUS
  Administered 2012-08-17: 3 [IU] via SUBCUTANEOUS
  Administered 2012-08-17: 8 [IU] via SUBCUTANEOUS
  Administered 2012-08-18: 2 [IU] via SUBCUTANEOUS

## 2012-08-13 MED ORDER — LINAGLIPTIN 5 MG PO TABS
5.0000 mg | ORAL_TABLET | Freq: Every day | ORAL | Status: DC
  Administered 2012-08-14 – 2012-08-17 (×4): 5 mg via ORAL
  Filled 2012-08-13 (×5): qty 1

## 2012-08-13 MED ORDER — ACETAMINOPHEN 325 MG PO TABS
325.0000 mg | ORAL_TABLET | Freq: Four times a day (QID) | ORAL | Status: DC | PRN
  Administered 2012-08-14 – 2012-08-16 (×3): 650 mg via ORAL
  Filled 2012-08-13 (×4): qty 2

## 2012-08-13 NOTE — Discharge Summary (Signed)
Physician Discharge Summary  Patient ID: April [name] MRN: [id number] DOB/AGE: March 20, 1922 79 y.o.  Admit date: 08/06/2012 Discharge date: 08/13/2012  Admission Diagnoses:  Recurrent right hemispheric chronic subdural hematoma  Discharge Diagnoses:  Recurrent right hemispheric chronic subdural hematoma Active Problems:   Diabetes mellitus   Anemia   HTN (hypertension)   DVT (deep venous thrombosis)   CKD (chronic kidney disease) stage 3, GFR 30-59 ml/min   Subdural hematoma   Acute respiratory failure   Discharged Condition: good  Hospital Course: Patient was transferred back from the [facility] rehabilitation unit after developing increasing lethargy and left hemiparesis. She was about 2 weeks status post craniotomy for evacuation of subacute and chronic subdural hematoma. Repeat CT scan showed significant recurrence of chronic subdural hematoma, with mass effect and midline shift. She was taken back to surgery for a left frontoparietal craniectomy, evacuation of subdural hematoma, and implantation of cranial flap in the right abdominal wall. Postoperatively she was supported on ventilator for about a day and a half, weaned from the ventilator, and extubated. The CCM service was consulted for assistance with her initial postoperative management and care. She has steadily recovered neurologically, becoming more awake and alert, oriented to name, and following commands. Her cranial incision is healed nicely, and the staples have been removed.  Her abdominal incision also is healed nicely. We left 2 Jackson-Pratt drains in the subdural space, and those were removed on the fourth postoperative day. Patient was seen in consultation by PT and OT, who've continue to work with the patient, and have recommended CIR. Repeat CT scan this morning shows only a small amount of residual subdural collection, without significant mass effect or shift. Patient is being transferred back to the [facility]  rehabilitation unit.  Discharge Exam: Blood pressure 175/75, pulse 85, temperature 98.6 F (37 C), temperature source Oral, resp. rate 20, height 5' (1.524 m), weight 66.5 kg (146 lb 9.7 oz), SpO2 100.00%.  Disposition: [place] rehabilitation unit     Medication List    TAKE these medications       amLODipine 10 MG tablet  Commonly known as:  NORVASC  Take 1 tablet (10 mg total) by mouth daily.     atorvastatin 40 MG tablet  Commonly known as:  LIPITOR  Take 40 mg by mouth every evening.     carvedilol 25 MG tablet  Commonly known as:  COREG  Take 1 tablet (25 mg total) by mouth 2 (two) times daily with a meal.     cloNIDine 0.3 mg/24hr  Commonly known as:  CATAPRES - Dosed in mg/24 hr  Place 1 patch (0.3 mg total) onto the skin once a week.     hydrochlorothiazide 25 MG tablet  Commonly known as:  HYDRODIURIL  Take 25 mg by mouth daily.     insulin glargine 100 UNIT/ML injection  Commonly known as:  LANTUS  Inject 20 Units into the skin daily.     sitaGLIPtin 50 MG tablet  Commonly known as:  JANUVIA  Take 1 tablet (50 mg total) by mouth daily.     telmisartan 80 MG tablet  Commonly known as:  MICARDIS  Take 1 tablet (80 mg total) by mouth daily.         Signed: [name], MD 08/13/2012, 11:59 AM

## 2012-08-13 NOTE — Progress Notes (Signed)
PT Cancellation Note  Patient Details Name: April [name] MRN: [id number] DOB: [birth year]   Cancelled Treatment:    Reason Eval/Treat Not Completed: Other (comment) (going to CIR today)   [name], [place] 08/13/2012, 1:01 PM

## 2012-08-13 NOTE — Progress Notes (Signed)
Patient's blood pressure on admission 184/72, HR 86.  Deatra Ina, PA notified; no new orders received.  Will continue to monitor.

## 2012-08-13 NOTE — Plan of Care (Signed)
Overall Plan of Care [facility]) Patient Details Name: April [name] MRN: [id number] DOB: [birth year]  Diagnosis:  Acute on chronic right SDH  Co-morbidities: htn, dm, pseudogout  Functional Problem List  Patient demonstrates impairments in the following areas: Balance, Bladder, Bowel, Cognition, Endurance, Motor, Pain and Safety  Basic ADL's: grooming, bathing, dressing and toileting Advanced ADL's: N/A  Transfers:  bed mobility, bed to chair, toilet, tub/shower, car and furniture Locomotion:  ambulation, wheelchair mobility and stairs  Additional Impairments:  Swallowing, Communication  comprehension and expression and Social Cognition   problem solving, memory, attention and awareness  Anticipated Outcomes Item Anticipated Outcome  Eating/Swallowing  Min A  Basic self-care  supervision  Tolieting  supervision  Bowel/Bladder  Max assist for timed toileting  Transfers  supervision  Locomotion  supervision  Communication  Min A  Cognition  Min-Mod A  Pain  </=3  Safety/Judgment  Mod A  Other     Therapy Plan: PT Intensity: Minimum of 1-2 x/day ,45 to 90 minutes PT Frequency: 5 out of 7 days PT Duration Estimated Length of Stay: 7-10 days OT Intensity: Minimum of 1-2 x/day, 45 to 90 minutes OT Frequency: 5 out of 7 days OT Duration/Estimated Length of Stay: 7-10 days SLP Intensity: Minumum of 1-2 x/day, 30 to 90 minutes SLP Frequency: 5 out of 7 days SLP Duration/Estimated Length of Stay:  1 week    Team Interventions: Item RN PT OT SLP SW TR Other  Self Care/Advanced ADL Retraining   x      Neuromuscular Re-Education  x x      Therapeutic Activities  x x x     UE/LE Strength Training/ROM  x x      UE/LE Coordination Activities  x x      Visual/Perceptual Remediation/Compensation         DME/Adaptive Equipment Instruction  x x      Therapeutic Exercise  x x      Balance/Vestibular Training  x x      Patient/Family Education x x x x     Cognitive  Remediation/Compensation  x x x     Functional Mobility Training  x x      Ambulation/Gait Training  x       Stair Training  x       Wheelchair Propulsion/Positioning  x       Functional Tourist information centre manager Reintegration  x x      Dysphagia/Aspiration Printmaker    x     Speech/Language Facilitation    x     Bladder Management x        Bowel Management x        Disease Management/Prevention x        Pain Management x x x      Medication Management x        Skin Care/Wound Management x        Splinting/Orthotics  x       Discharge Planning  x x x     Psychosocial Support x  x x                            Team Discharge Planning: Destination: PT-Home ,OT- Home , SLP-Home Projected Follow-up: PT-Home health PT, OT-  Home health OT, SLP-24 hour supervision/assistance;Home Health SLP Projected Equipment Needs: PT- , OT-  , SLP-None recommended by SLP Patient/family  involved in discharge planning: PT- Patient,  OT-Patient, SLP-Patient  MD ELOS: 7-10 days Medical Rehab Prognosis:  Excellent Assessment: The patient has been admitted for CIR therapies. The team will be addressing, functional mobility, strength, stamina, balance, safety, adaptive techniques/equipment, self-care, bowel and bladder mgt, patient and caregiver education, NMR, cognition, communication, pain mgt. Goals have been set at min assist to supervision. Pseudogout in left knee has impeded progress with gait and wb on left lower extremity     See Team Conference Notes for weekly updates to the plan of care

## 2012-08-13 NOTE — Progress Notes (Signed)
Rehab admission - Bed available on inpatient rehab today.  RN spoke with Dr. Newell Coral and he is okay with admission to inpatient rehab today.  Will plan to admit to acute inpatient rehab today.  Call me for questions.  #[phone]

## 2012-08-13 NOTE — H&P (Signed)
Physical Medicine and Rehabilitation Admission H&P  No chief complaint on file.  :  HPI: April [name] is a 79 y.o. right-handed female with history of diabetes mellitus and peripheral neuropathy, hypertension and chronic renal insufficiency with baseline 1.80-2.10 as well his recent DVT with IVC filter placed. Admitted 07/23/2012 with noted history of large right hemispheric chronic subdural hematoma. She had been seen by neurosurgery in the past and surgeries not felt to be indicated. She now presents with increased left-sided weakness over the past 2 days and followup CT scan again shows chronic subdural hematoma with significant mass effect and midline shift. Patient underwent right frontoparietal craniotomy evacuation of subdural hematoma 07/23/2012 per Dr. Newell Coral. Placed on Keppra for seizure prophylaxis. Followup cranial CT scan 07/30/2012 does show some increase in size of right sided subdural collection as well as slight increase in size of blood collection. Dr. Newell Coral of neurosurgery has reviewed the film and neurologically patient looks good with plan to repeat cranial CT scan in one week. Patient is maintained on a dysphagia 3 thin liquid diet. Latest hemoglobin A1c of 12.3 and currently maintained on oral agents as well as sliding scale insulin. Physical and occupational therapy evaluations completed 07/26/2012 was noted ongoing bouts of confusion, problem solving and memory loss. Recommendations are made for physical medicine rehabilitation consult to consider inpatient rehabilitation services. Patient was felt to be a good candidate for inpatient rehabilitation services and was admitted for a comprehensive rehabilitation program. She was admitted for inpatient rehabilitation services and participating with bouts of lethargy noted. Continue close followup per neurosurgery. Follow cranial CT scan was ultimately completed showing increased accumulation of mixed density fluid in the subdural space  maximal thickness increased from 14 mm to 21 mm. She was discharged to acute care services of Dr. Newell Coral and underwent right frontal parietal craniectomy, implantation of bone flap and to the right abdominal wall and evacuation of chronic subdural hematoma 08/06/2012. She remains on Keppra for seizure prophylaxis. Latest followup CT scan showed decrease in size of right-sided subdural hematoma as well as decreased midline shift to the left. Therapies have been resumed with plan to return back to inpatient rehabilitation services for ongoing therapy.     Review of Systems  Musculoskeletal: Positive for myalgias.  Neurological: Positive for weakness and headaches.  All other systems reviewed and are negative  Past Medical History   Diagnosis  Date   .  Diabetes mellitus    .  Hypertension    .  Hyperlipidemia    .  Pneumonia    .  Subdural hematoma    .  Arthritis    .  DVT (deep venous thrombosis)    .  Incontinence    .  Renal disorder      chronic kidney dz stage III    Past Surgical History   Procedure  Laterality  Date   .  No past surgeries     .  Insertion of vena cava filter     .  Craniotomy  Right  07/23/2012     Procedure: CRANIOTOMY HEMATOMA EVACUATION SUBDURAL; Surgeon: [name], MD; Location: [facility]; Service: Neurosurgery; Laterality: Right;   .  Craniotomy  N/A  08/06/2012     Procedure: CRANIOTOMY HEMATOMA EVACUATION SUBDURAL; Surgeon: [name], MD; Location: [facility] ORS; Service: Neurosurgery; Laterality: N/A; Craniectomy for evacuation of subdural hematoma, implantation of bone flap in abdominal wall    Family History   Problem  Relation  Age  of Onset   .  Diabetes Mellitus II  Son     Social History: reports that she has never smoked. She has never used smokeless tobacco. She reports that she does not drink alcohol or use illicit drugs.  Allergies: No Known Allergies  Medications Prior to Admission   Medication  Sig  Dispense  Refill   .   amLODipine (NORVASC) 10 MG tablet  Take 1 tablet (10 mg total) by mouth daily.  30 tablet  1   .  atorvastatin (LIPITOR) 40 MG tablet  Take 40 mg by mouth every evening.     .  carvedilol (COREG) 25 MG tablet  Take 1 tablet (25 mg total) by mouth 2 (two) times daily with a meal.  60 tablet  3   .  cloNIDine (CATAPRES - DOSED IN MG/24 HR) 0.3 mg/24hr  Place 1 patch (0.3 mg total) onto the skin once a week.  4 patch  2   .  hydrochlorothiazide (HYDRODIURIL) 25 MG tablet  Take 25 mg by mouth daily.     .  insulin glargine (LANTUS) 100 UNIT/ML injection  Inject 20 Units into the skin daily.     .  sitaGLIPtin (JANUVIA) 50 MG tablet  Take 1 tablet (50 mg total) by mouth daily.  30 tablet  1   .  telmisartan (MICARDIS) 80 MG tablet  Take 1 tablet (80 mg total) by mouth daily.  30 tablet  3    Home:  Home Living  Lives With: Son  Available Help at Discharge: Family;Available PRN/intermittently  Type of Home: House  Home Access: Stairs to enter  [employer] of Steps: 3-4  Entrance Stairs-Rails: Right  Home Layout: One level  Bathroom Shower/Tub: Medical sales representative: Standard  Home Adaptive Equipment: None  Additional Comments: patient was ambulating MOD I on d/c 06/23/12 with no DME  Functional History:  Prior Function  Able to Take Stairs?: Yes  Driving: No  Vocation: Retired  Functional Status:  Mobility:  Bed Mobility  Supine to Sit: 3: Mod assist;HOB flat  Sitting - Scoot to [place] of Bed: 4: Min assist  Transfers  Sit to Stand: 4: Min assist;With upper extremity assist;From bed  Stand to Sit: 4: Min assist;With upper extremity assist;To chair/3-in-1;With armrests  Ambulation/Gait  Ambulation/Gait Assistance: 4: Min assist  Ambulation/Gait: Patient Percentage: 70%  Ambulation Distance (Feet): 150 Feet  Assistive device: Rolling walker  Ambulation/Gait Assistance Details: min assist mainly to support trunk for balance due to right knee still a little weak and  antalgic gait pattern. Most assist needed to steer RW around obstacles with max verbal cues for safe use of RW and obstacle identification.  Gait Pattern: Step-through pattern  Gait velocity: less than 1.8 ft/sec indicating risk for recurrent falls  General Gait Details: extra person for line management   ADL:  ADL  Eating/Feeding: Maximal assistance;Min guard  Where Assessed - Eating/Feeding: Chair (see OT note session)  Grooming: Wash/dry face;Min guard  Where Assessed - Grooming: Supported sitting  Lower Body Dressing: +1 Total assistance  Where Assessed - Lower Body Dressing: Supported standing  Toilet Transfer: Moderate assistance  Toilet Transfer Method: Sit to stand  Toilet Transfer Equipment: Regular height toilet;Grab bars  Equipment Used: Gait belt;Rolling walker  Transfers/Ambulation Related to ADLs: Pt ambulating with RW using the RW too far in advance with therapist providing max (A) to keep walker close for support. Pt needed cueing to maintain attention to task  ADL Comments:  Pt pleasant and immediately attempting to exit bed on Right side. Pt could benefit from helmet due to poor cognition and mobility to roll onto Rt side of skull without bone flap. Pt required (A) to initiate all task this session. Pt standing at sink holding tooth brush with visual auditory and tactile cue to brush teeth. Pt continued task once assisted with starting. Pt when asked to open mouth barely opening mouth but demonstrates during session ability to open wider. Pt with whiteness noted on tongue and risk for thrush due to poor oral hygiene. Pt assisted with oral care for hygiene detail max (A). pt eating jello and broth with hand over hand max (A) at the start of the meal. PT progressed to min guard (A) with repetition and auditory cues. Pt with poor grasp and edema noted in bil hands. pt with eyes watering bil  Cognition:  Cognition  Arousal/Alertness: Lethargic  Orientation Level: Oriented to  person;Oriented to place;Disoriented to time;Disoriented to situation  Cognition  Overall Cognitive Status: Impaired  Area of Impairment: Attention;Memory;Problem solving;Awareness of deficits  Arousal/Alertness: Lethargic  Orientation Level: Situation;Time;Person;Disoriented to  Behavior During Session: Flat affect  Current Attention Level: Sustained  Memory Deficits: no recall of events- did not know she had surgery again, did not know she was in ICU  Following Commands: Follows one step commands consistently;Follows one step commands with increased time  Safety/Judgement: Decreased awareness of safety precautions;Impulsive;Decreased safety judgement for tasks assessed;Decreased awareness of need for assistance  Awareness of Errors: Assistance required to identify errors made;Assistance required to correct errors made  Problem Solving: slow processing  Cognition - Other Comments: Pt did report she was at [facility] today. She has difficulty realizing errors (driving RW into obstacles, unable to self correct)    Physical Exam:  Blood pressure 186/81, pulse 88, temperature 98.3 F (36.8 C), temperature source Oral, resp. rate 20, height 5' (1.524 m), weight 66.5 kg (146 lb 9.7 oz), SpO2 100.00%.   HENT: dentition fair Staples intact to craniotomy site. No drainage noted.  Eyes: EOM are normal.  Neck: Neck supple. No thyromegaly present.  Cardiovascular: Regular rhythm. No murmurs, rubs, gallops Pulmonary/Chest: Effort normal and breath sounds normal. She has no wheezes.  Abdominal: Soft. Bowel sounds are normal. She exhibits no distension.  Neurological: She is alert.  Patient can provide her name and place but not age. She did follow simple commands. She did need frequent redirection to tasks.She could name familiar objects. Decreased coordination with fine motor movements of the left arm, leg. No focal CN findings,  Left upper ext grossly 4/5 proximal to distal. LLE 4/5 prox to 4+  distally. Limited insight and awareness. No gross visual-spatial deficits.   Results for orders placed during the hospital encounter of 08/06/12 (from the past 48 hour(s))   GLUCOSE, CAPILLARY Status: Abnormal    Collection Time    08/11/12 9:42 AM   Result  Value  Range    Glucose-Capillary  162 (*)  70 - 99 mg/dL   GLUCOSE, CAPILLARY Status: Abnormal    Collection Time    08/11/12 11:47 AM   Result  Value  Range    Glucose-Capillary  151 (*)  70 - 99 mg/dL   GLUCOSE, CAPILLARY Status: Abnormal    Collection Time    08/11/12 4:49 PM   Result  Value  Range    Glucose-Capillary  258 (*)  70 - 99 mg/dL   GLUCOSE, CAPILLARY Status: Abnormal    Collection Time  08/11/12 10:00 PM   Result  Value  Range    Glucose-Capillary  265 (*)  70 - 99 mg/dL   BASIC METABOLIC PANEL Status: Abnormal    Collection Time    08/12/12 4:30 AM   Result  Value  Range    Sodium  144  135 - 145 mEq/L    Potassium  3.0 (*)  3.5 - 5.1 mEq/L    Chloride  114 (*)  96 - 112 mEq/L    CO2  21  19 - 32 mEq/L    Glucose, Bld  170 (*)  70 - 99 mg/dL    BUN  27 (*)  6 - 23 mg/dL    Creatinine, Ser  1.47 (*)  0.50 - 1.10 mg/dL    Calcium  8.3 (*)  8.4 - 10.5 mg/dL    GFR calc non Af Amer  21 (*)  >90 mL/min    GFR calc Af Amer  24 (*)  >90 mL/min    Comment:      The eGFR has been calculated     using the CKD EPI equation.     This calculation has not been     validated in all clinical     situations.     eGFR's persistently     <90 mL/min signify     possible Chronic Kidney Disease.   GLUCOSE, CAPILLARY Status: Abnormal    Collection Time    08/12/12 7:35 AM   Result  Value  Range    Glucose-Capillary  137 (*)  70 - 99 mg/dL   GLUCOSE, CAPILLARY Status: Abnormal    Collection Time    08/12/12 12:06 PM   Result  Value  Range    Glucose-Capillary  161 (*)  70 - 99 mg/dL   GLUCOSE, CAPILLARY Status: Abnormal    Collection Time    08/12/12 5:09 PM   Result  Value  Range    Glucose-Capillary  169  (*)  70 - 99 mg/dL   GLUCOSE, CAPILLARY Status: Abnormal    Collection Time    08/12/12 10:16 PM   Result  Value  Range    Glucose-Capillary  212 (*)  70 - 99 mg/dL    No results found.  Post Admission Physician Evaluation:  1. Functional deficits secondary to acute on chronic right SDH. 2. Patient is admitted to receive collaborative, interdisciplinary care between the physiatrist, rehab nursing staff, and therapy team. 3. Patient's level of medical complexity and substantial therapy needs in context of that medical necessity cannot be provided at a lesser intensity of care such as a SNF. 4. Patient has experienced substantial functional loss from his/her baseline which was documented above under the "Functional History" and "Functional Status" headings. Judging by the patient's diagnosis, physical exam, and functional history, the patient has potential for functional progress which will result in measurable gains while on inpatient rehab. These gains will be of substantial and practical use upon discharge in facilitating mobility and self-care at the household level. 5. Physiatrist will provide 24 hour management of medical needs as well as oversight of the therapy plan/treatment and provide guidance as appropriate regarding the interaction of the two. 6. 24 hour rehab nursing will assist with bladder management, bowel management, safety, skin/wound care, disease management, medication administration, pain management and patient education and help integrate therapy concepts, techniques,education, etc. 7. PT will assess and treat for/with: Lower extremity strength, range of motion, stamina, balance, functional mobility, safety, adaptive techniques and  equipment, NMR, education. Goals are: supervision. 8. OT will assess and treat for/with: ADL's, functional mobility, safety, upper extremity strength, adaptive techniques and equipment, NMR, education. Goals are: supervision to minimal assist. 9. SLP  will assess and treat for/with: cognition, communication. Goals are: supervision. 10. Case Management and Social Worker will assess and treat for psychological issues and discharge planning. 11. Team conference will be held weekly to assess progress toward goals and to determine barriers to discharge. 12. Patient will receive at least 3 hours of therapy per day at least 5 days per week. 13. ELOS: 7 days Prognosis: excellent Medical Problem List and Plan:  1. Acute on chronic right subdural hematoma. Status post right frontal parietal craniotomy 07/13/2012 with recurrent chronic subdural hematoma status post right frontal parietal craniectomy implantation of bone flap into right abdominal wall evacuation of chronic subdural hematoma 08/06/2012  2. DVT Prophylaxis/Anticoagulation: SCDs. Monitor for any signs of DVT. Bilateral Doppler studies 07/03/2012 negative  3. Pain Management: Presently with Tylenol only. Monitor with increased mobility  4. Neuropsych: This patient is not capable of making decisions on his/her own behalf.  5. Seizure prophylaxis. Keppra 500 mg twice a day  6. Diabetes mellitus with peripheral neuropathy. Latest hemoglobin A1c of 12.3.Tradjenta 5 mg daily. Check blood sugars a.c. and at bedtime  7. Hypertension. Avapro 300 mg daily, Norvasc 10 mg daily, clonidine patch 0.3 mg change every 7 days. Monitor the increased mobility  8. Hyperlipidemia. Lipitor   Ranelle Oyster, MD, [place] [facility]  08/13/2012

## 2012-08-13 NOTE — Progress Notes (Signed)
Patient received from 3100.  Arousable, oriented to self and place.  Vitals obtained.  Patient oriented to room and unit.  No family present.  Will continue to monitor.

## 2012-08-13 NOTE — Significant Event (Signed)
Pt with decreased urine outpt.  Bladder scan showed 350 ml in bladder.  Will have in/out cath and monitor.  Coralyn Helling, MD [facility] Pulmonary/Critical Care 08/13/2012, 12:33 AM

## 2012-08-14 ENCOUNTER — Inpatient Hospital Stay (HOSPITAL_COMMUNITY): Payer: Medicare Other | Admitting: Speech Pathology

## 2012-08-14 ENCOUNTER — Encounter (HOSPITAL_COMMUNITY): Payer: Self-pay | Admitting: *Deleted

## 2012-08-14 ENCOUNTER — Inpatient Hospital Stay (HOSPITAL_COMMUNITY): Payer: Medicare Other

## 2012-08-14 ENCOUNTER — Inpatient Hospital Stay (HOSPITAL_COMMUNITY): Payer: Medicare Other | Admitting: Occupational Therapy

## 2012-08-14 ENCOUNTER — Inpatient Hospital Stay (HOSPITAL_COMMUNITY): Payer: Medicare Other | Admitting: Physical Therapy

## 2012-08-14 DIAGNOSIS — S065X9A Traumatic subdural hemorrhage with loss of consciousness of unspecified duration, initial encounter: Secondary | ICD-10-CM

## 2012-08-14 DIAGNOSIS — I6200 Nontraumatic subdural hemorrhage, unspecified: Secondary | ICD-10-CM

## 2012-08-14 DIAGNOSIS — E1165 Type 2 diabetes mellitus with hyperglycemia: Secondary | ICD-10-CM

## 2012-08-14 DIAGNOSIS — I10 Essential (primary) hypertension: Secondary | ICD-10-CM

## 2012-08-14 DIAGNOSIS — W19XXXA Unspecified fall, initial encounter: Secondary | ICD-10-CM

## 2012-08-14 LAB — CBC WITH DIFFERENTIAL/PLATELET
Basophils Absolute: 0 10*3/uL (ref 0.0–0.1)
Basophils Relative: 0 % (ref 0–1)
Eosinophils Relative: 1 % (ref 0–5)
HCT: 25.8 % — ABNORMAL LOW (ref 36.0–46.0)
Hemoglobin: 8.8 g/dL — ABNORMAL LOW (ref 12.0–15.0)
Lymphocytes Relative: 13 % (ref 12–46)
MCH: 27 pg (ref 26.0–34.0)
MCV: 79.1 fL (ref 78.0–100.0)
Neutro Abs: 5.6 10*3/uL (ref 1.7–7.7)
RBC: 3.26 MIL/uL — ABNORMAL LOW (ref 3.87–5.11)

## 2012-08-14 LAB — URINALYSIS, ROUTINE W REFLEX MICROSCOPIC
Bilirubin Urine: NEGATIVE
Glucose, UA: 250 mg/dL — AB
Specific Gravity, Urine: 1.018 (ref 1.005–1.030)
Urobilinogen, UA: 0.2 mg/dL (ref 0.0–1.0)
pH: 6.5 (ref 5.0–8.0)

## 2012-08-14 LAB — COMPREHENSIVE METABOLIC PANEL
ALT: 11 U/L (ref 0–35)
AST: 17 U/L (ref 0–37)
Albumin: 1.6 g/dL — ABNORMAL LOW (ref 3.5–5.2)
CO2: 20 mEq/L (ref 19–32)
Calcium: 8.5 mg/dL (ref 8.4–10.5)
Chloride: 107 mEq/L (ref 96–112)
GFR calc non Af Amer: 22 mL/min — ABNORMAL LOW (ref >90)
Sodium: 139 mEq/L (ref 135–145)

## 2012-08-14 LAB — GLUCOSE, CAPILLARY
Glucose-Capillary: 167 mg/dL — ABNORMAL HIGH (ref 70–99)
Glucose-Capillary: 190 mg/dL — ABNORMAL HIGH (ref 70–99)
Glucose-Capillary: 191 mg/dL — ABNORMAL HIGH (ref 70–99)

## 2012-08-14 LAB — URINE MICROSCOPIC-ADD ON

## 2012-08-14 MED ORDER — POTASSIUM CHLORIDE CRYS ER 20 MEQ PO TBCR
20.0000 meq | EXTENDED_RELEASE_TABLET | Freq: Two times a day (BID) | ORAL | Status: AC
  Administered 2012-08-14 – 2012-08-15 (×4): 20 meq via ORAL
  Filled 2012-08-14 (×4): qty 1

## 2012-08-14 MED ORDER — GLUCERNA SHAKE PO LIQD
237.0000 mL | Freq: Two times a day (BID) | ORAL | Status: DC
  Administered 2012-08-14 – 2012-08-17 (×6): 237 mL via ORAL

## 2012-08-14 MED ORDER — PREDNISONE 10 MG PO TABS
10.0000 mg | ORAL_TABLET | Freq: Two times a day (BID) | ORAL | Status: DC
  Administered 2012-08-14 – 2012-08-15 (×3): 10 mg via ORAL
  Filled 2012-08-14 (×6): qty 1

## 2012-08-14 MED ORDER — ADULT MULTIVITAMIN W/MINERALS CH
1.0000 | ORAL_TABLET | Freq: Every day | ORAL | Status: DC
  Administered 2012-08-14 – 2012-08-18 (×5): 1 via ORAL
  Filled 2012-08-14 (×6): qty 1

## 2012-08-14 NOTE — Plan of Care (Signed)
Problem: RH BLADDER ELIMINATION Goal: RH STG MANAGE BLADDER WITH ASSISTANCE STG Manage Bladder With Assistance  Outcome: Not Progressing 08/14/12 Patient with urinary retention. Unable to void. A.McCray,LPN

## 2012-08-14 NOTE — Evaluation (Signed)
Physical Therapy Assessment and Plan  Patient Details  Name: April [name] MRN: [id number] Date of Birth: July 04, 1921  PT Diagnosis: Abnormality of gait, Cognitive deficits, Difficulty walking and Muscle weakness Rehab Potential: Good ELOS:7:30-8:25 7-10 days   Today's Date: 08/14/2012 Time: Time Calculation (min): 55 min  Problem List:  Patient Active Problem List  Diagnosis  . Encephalopathy acute  . Community acquired pneumonia  . Diabetes mellitus  . Hyperlipidemia  . Hypertension  . SDH (subdural hematoma)  . Lower extremity edema  . Anemia  . ARF (acute renal failure)  . HTN (hypertension)  . DVT (deep venous thrombosis)  . CKD (chronic kidney disease) stage 3, GFR 30-59 ml/min  . Subdural hematoma  . Acute respiratory failure  . Subdural hemorrhage    Past Medical History:  Past Medical History  Diagnosis Date  . Diabetes mellitus   . Hypertension   . Hyperlipidemia   . Pneumonia   . Subdural hematoma   . Arthritis   . DVT (deep venous thrombosis)   . Incontinence   . Renal disorder     chronic kidney dz stage III   Past Surgical History:  Past Surgical History  Procedure Laterality Date  . No past surgeries    . Insertion of vena cava filter    . Craniotomy Right 07/23/2012    Procedure: CRANIOTOMY HEMATOMA EVACUATION SUBDURAL;  Surgeon: [name], MD;  Location: [facility];  Service: Neurosurgery;  Laterality: Right;  . Craniotomy N/A 08/06/2012    Procedure: CRANIOTOMY HEMATOMA EVACUATION SUBDURAL;  Surgeon: [name], MD;  Location: [facility];  Service: Neurosurgery;  Laterality: N/A;  Craniectomy for evacuation of subdural hematoma, implantation of bone flap in abdominal wall     Assessment & Plan Clinical Impression: Patient is a 79 y.o. year old female with recent admission for inpatient rehabilitation services and participating with bouts of lethargy noted. Continue close followup per neurosurgery. Follow cranial CT scan was  ultimately completed showing increased accumulation of mixed density fluid in the subdural space maximal thickness increased from 14 mm to 21 mm. She was discharged to acute care services of Dr. Newell Coral and underwent right frontal parietal craniectomy, implantation of bone flap and to the right abdominal wall and evacuation of chronic subdural hematoma 08/06/2012. She remains on Keppra for seizure prophylaxis. Latest followup CT scan showed decrease in size of right-sided subdural hematoma as well as decreased midline shift to the left.  .  Patient transferred to CIR on 08/13/2012 .   Patient currently requires max with mobility secondary to muscle weakness, decreased coordination, decreased attention, decreased awareness, decreased problem solving, decreased safety awareness, decreased memory and delayed processing and decreased sitting balance, decreased standing balance and decreased balance strategies.  Prior to hospitalization, patient was modified independent  with mobility and lived with Son in a House home.  Home access is 3-4Stairs to enter.  Patient will benefit from skilled PT intervention to maximize safe functional mobility, minimize fall risk and decrease caregiver burden for planned discharge home with 24 hour supervision.  Anticipate patient will benefit from follow up HH at discharge.  PT - End of Session Activity Tolerance: Tolerates 30+ min activity with multiple rests Endurance Deficit: Yes Endurance Deficit Description: needs frequent rests, limited by L knee pain PT Assessment Rehab Potential: Good Barriers to Discharge: Decreased caregiver support PT Plan PT Intensity: Minimum of 1-2 x/day ,45 to 90 minutes PT Frequency: 5 out of 7 days PT Duration Estimated Length of  Stay: 7-10 days PT Treatment/Interventions: Ambulation/gait training;Discharge planning;Functional mobility training;Therapeutic Activities;Therapeutic Exercise;Neuromuscular re-education;Balance/vestibular  training;Wheelchair propulsion/positioning;UE/LE Strength taining/ROM;Splinting/orthotics;Pain management;DME/adaptive equipment instruction;Cognitive remediation/compensation;Community reintegration;Patient/family education;Stair training;UE/LE Coordination activities PT Recommendation Follow Up Recommendations: Home health PT Patient destination: Home  Skilled Therapeutic Intervention Standing balance with pre gait activities with RW focus on wt shifting to L to encourage wt bearing through L LE.  Pt requires mod A for wt shift and cues for posture.  Attempt standing balance activities with pt requiring max cuing for following 1 step commands when more fatigued, mod manual facilitation for wt shifts to L.  PT Evaluation Precautions/Restrictions Precautions Precautions: Fall Precaution Comments: NO Bone flap right side of skull, two JP drains, Decreased safety awareness, decreased awareness of deficits Restrictions Weight Bearing Restrictions: No Pain Pain Assessment Pain Score:   9 Pain Type: Acute pain Pain Location: Knee Pain Orientation: Left Pain Descriptors: Aching Pain Onset: On-going Pain Intervention(s): RN made aware;Repositioned Home Living/Prior Functioning Home Living Lives With: Son Available Help at Discharge: Family;Available PRN/intermittently Type of Home: House Home Access: Stairs to enter [employer] of Steps: 3-4 Entrance Stairs-Rails: Right Home Layout: One level Bathroom Shower/Tub: Engineer, manufacturing systems: Standard Home Adaptive Equipment: None Additional Comments: When asked about equipment pt answered "yes" to adaptive equipment and "yes" when asked more specifically to shower seat and grab bars.  "My boys can fix up anything" Prior Function Level of Independence: Independent with basic ADLs;Independent with homemaking with ambulation;Independent with transfers Able to Take Stairs?: Yes Driving: No Vocation:  Retired  IT consultant Overall Cognitive Status: Impaired Arousal/Alertness: Lethargic Orientation Level: Oriented to person;Oriented to place;Disoriented to situation;Disoriented to time Attention: Sustained Sustained Attention: Impaired Sustained Attention Impairment: Verbal basic;Functional basic Memory: Impaired Memory Impairment: Decreased recall of new information;Decreased short term memory Decreased Short Term Memory: Verbal basic;Functional basic Awareness: Impaired Awareness Impairment: Intellectual impairment Behaviors: Impulsive Safety/Judgment: Impaired Sensation Sensation Light Touch: Impaired Detail Light Touch Impaired Details: Impaired LUE Proprioception: Impaired Detail Proprioception Impaired Details: Impaired LUE Coordination Gross Motor Movements are Fluid and Coordinated: Yes Fine Motor Movements are Fluid and Coordinated: No (decreased coordination with alternating movements) Motor  Motor Motor - Skilled Clinical Observations: generalized weakness  Mobility Bed Mobility Supine to Sit: 3: Mod assist Supine to Sit Details (indicate cue type and reason): cues for wt shift and sequencing, assist to lift trunk, moves L LE with hands Sitting - Scoot to Edge of Bed: 3: Mod assist Sitting - Scoot to Edge of Bed Details (indicate cue type and reason): facilitation for wt shifts Transfers Sit to Stand: 3: Mod assist Sit to Stand Details (indicate cue type and reason): assist for wt shift and lift due to L LE pain Stand Pivot Transfers: 2: Max assist Squat Pivot Transfers: 2: Max Designer, industrial/product Details (indicate cue type and reason): Pt unable to fully stand without UE support due to pain, squat pivot to w/c with max A for wt shift due to no wt bearing through L LE Locomotion  Ambulation Ambulation: Yes Ambulation/Gait Assistance: 2: Max assist Ambulation Distance (Feet): 3 Feet Assistive device: Rolling walker Ambulation/Gait Assistance Details: Pt  required max A for wt shift to L to advance R LE due to pain, unable to tolerate more than a few short steps before being limited by pain Stairs / Additional Locomotion Stairs: No (unable to attempt on eval due to pain) Naval architect Mobility: Yes Wheelchair Assistance: 4: Min Education officer, museum: Both upper extremities Wheelchair Parts Management: Needs assistance Distance:  75  Trunk/Postural Assessment  Cervical Assessment Cervical Assessment: Within Functional Limits Thoracic Assessment Thoracic Assessment: Within Functional Limits Lumbar Assessment Lumbar Assessment:  (posterior pelvic tilt) Postural Control Righting Reactions: delayed Protective Responses: delayed  Balance Static Sitting Balance Static Sitting - Level of Assistance: 5: Stand by assistance Dynamic Sitting Balance Dynamic Sitting - Balance Support: During functional activity Dynamic Sitting - Level of Assistance: 4: Min assist Static Standing Balance Static Standing - Level of Assistance: 3: Mod assist Dynamic Standing Balance Dynamic Standing - Level of Assistance: 1: +1 Total assist Dynamic Standing - Comments: limited by pain Extremity Assessment  RUE Assessment RUE Assessment: Within Functional Limits LUE Assessment LUE Assessment: Exceptions to [facility] (strength grossly 4/5) RLE Assessment RLE Assessment: Within Functional Limits LLE Strength LLE Overall Strength Comments: AROM and strength limited by knee pain today  FIM:  FIM - Bed/Chair Transfer Bed/Chair Transfer: 2: Chair or W/C > Bed: Max A (lift and lower assist);2: Bed > Chair or W/C: Max A (lift and lower assist);3: Sit > Supine: Mod A (lifting assist/Pt. 50-74%/lift 2 legs);3: Supine > Sit: Mod A (lifting assist/Pt. 50-74%/lift 2 legs FIM - Locomotion: Wheelchair Distance: 75 FIM - Locomotion: Ambulation Ambulation/Gait Assistance: 2: Max assist   Refer to Care Plan for Long Term Goals  Recommendations for  other services: None  Discharge Criteria: Patient will be discharged from PT if patient refuses treatment 3 consecutive times without medical reason, if treatment goals not met, if there is a change in medical status, if patient makes no progress towards goals or if patient is discharged from hospital.  The above assessment, treatment plan, treatment alternatives and goals were discussed and mutually agreed upon: by patient  [facility] 08/14/2012, 12:23 PM

## 2012-08-14 NOTE — Progress Notes (Signed)
Subjective/Complaints: Awake, slept well. Denies pain. A 12 point review of systems has been performed and if not noted above is otherwise negative.   Objective: Vital Signs: Blood pressure 186/94, pulse 103, temperature 99.1 F (37.3 C), temperature source Oral, resp. rate 20, height 5\' 3"  (1.6 m), weight 63.2 kg (139 lb 5.3 oz), SpO2 96.00%. Ct Head Wo Contrast  08/13/2012  *RADIOLOGY REPORT*  Clinical Data: Followup post drainage subdural hematoma.  CT HEAD WITHOUT CONTRAST  Technique:  Contiguous axial images were obtained from the base of the skull through the vertex without contrast.  Comparison: 08/10/2012.  Findings:  Subdural drains have been removed.  Post right frontal craniotomy.  Some decrease in amount but incomplete clearance of pneumocephalus.  Right-sided broad-based subdural collection predominately hypodense with maximal thickness frontal region of 7.6 mm versus prior 8.8 mm.  Focal cast of blood just below the craniotomy site with maximal thickness of 9.2 mm versus prior 9.6 mm.  Mass effect associated from the right subdural collection with mild compression of the right lateral ventricle.  Minimal displacement towards the left.  Prominent small vessel disease type changes most notable changes left parietal lobe. No obvious large acute thrombotic infarct.  No intracranial mass lesions seen separate from above described findings.  IMPRESSION:  Right-sided broad-based subdural collection predominately hypodense with maximal thickness frontal region of 7.6 mm versus prior 8.8 mm.  Focal cast of blood just below the craniotomy site with maximal thickness of 9.2 mm versus prior 9.6 mm.  Mass effect associated from the right subdural collection with mild compression of the right lateral ventricle.  Minimal displacement towards the left. This is without significant change.   Original Report Authenticated By: Lacy Duverney, M.D.    No results found for this basename: WBC, HGB, HCT, PLT,  in  the last 72 hours  Recent Labs  08/12/12 0430 08/13/12 0812  NA 144 141  K 3.0* 3.2*  CL 114* 109  GLUCOSE 170* 194*  BUN 27* 24*  CREATININE 2.00* 1.88*  CALCIUM 8.3* 8.4   CBG (last 3)   Recent Labs  08/13/12 1846 08/13/12 2119 08/14/12 0707  GLUCAP 215* 175* 167*    Wt Readings from Last 3 Encounters:  08/13/12 63.2 kg (139 lb 5.3 oz)  08/08/12 66.5 kg (146 lb 9.7 oz)  08/01/12 70.3 kg (154 lb 15.7 oz)    Physical Exam:  HENT: dentition fair Staples intact to craniotomy site. No drainage noted.  Eyes: EOM are normal.  Neck: Neck supple. No thyromegaly present.  Cardiovascular: Regular rhythm. No murmurs, rubs, gallops  Pulmonary/Chest: Effort normal and breath sounds normal. She has no wheezes.  Abdominal: Soft. Bowel sounds are normal. She exhibits no distension.  Neurological: She is alert.  Patient can provide her name and place but not age. She did follow simple commands. She did need frequent redirection to tasks.She could name familiar objects. Decreased coordination with fine motor movements of the left arm, leg. No focal CN findings, Left upper ext grossly 4/5 proximal to distal. LLE 4/5 prox to 4+ distally. Limited insight and awareness. No gross visual-spatial deficits.      Assessment/Plan: 1. Functional deficits secondary to acute on chronic right fronto-parietal SDH which require 3+ hours per day of interdisciplinary therapy in a comprehensive inpatient rehab setting. Physiatrist is providing close team supervision and 24 hour management of active medical problems listed below. Physiatrist and rehab team continue to assess barriers to discharge/monitor patient progress toward functional and medical goals.  Can hold on helmet at this point.   FIM:                   Comprehension Comprehension Mode: Auditory Comprehension: 2-Understands basic 25 - 49% of the time/requires cueing 51 - 75% of the time  Expression Expression Mode:  Verbal Expression: 1-Expresses basis less than 25% of the time/requires cueing greater than 75% of the time.  Social Interaction Social Interaction: 1-Interacts appropriately less than 25% of the time. May be withdrawn or combative.  Problem Solving Problem Solving: 1-Solves basic less than 25% of the time - needs direction nearly all the time or does not effectively solve problems and may need a restraint for safety  Memory Memory: 1-Recognizes or recalls less than 25% of the time/requires cueing greater than 75% of the time  Medical Problem List and Plan:  1. Acute on chronic right subdural hematoma. Status post right frontal parietal craniotomy 07/13/2012 with recurrent chronic subdural hematoma status post right frontal parietal craniectomy implantation of bone flap into right abdominal wall evacuation of chronic subdural hematoma 08/06/2012  2. DVT Prophylaxis/Anticoagulation: SCDs. Monitor for any signs of DVT. Bilateral Doppler studies 07/03/2012 negative  3. Pain Management: Presently with Tylenol only. Monitor with increased mobility  4. Neuropsych: This patient is not capable of making decisions on his/her own behalf.  5. Seizure prophylaxis. Keppra 500 mg twice a day  6. Diabetes mellitus with peripheral neuropathy. Latest hemoglobin A1c of 12.3.Tradjenta 5 mg daily. Check blood sugars a.c. and at bedtime- follow for pattern 7. Hypertension. Avapro 300 mg daily, Norvasc 10 mg daily, clonidine patch 0.3 mg change every 7 days. Monitor the increased mobility  8. Hyperlipidemia. Lipitor     LOS (Days) 1 A FACE TO FACE EVALUATION WAS PERFORMED  SWARTZ,ZACHARY T 08/14/2012 7:48 AM

## 2012-08-14 NOTE — Evaluation (Signed)
Speech Language Pathology Assessment and Plan  Patient Details  Name: April [name] MRN: [id number] Date of Birth: Nov 23, 1921  SLP Diagnosis: Dysarthria;Dysphagia;Cognitive Impairments  Rehab Potential: Good ELOS:  1 week   Today's Date: 08/14/2012 Time: 4098-1191 Time Calculation (min): 55 min  Skilled Therapeutic Intervention: Administered BSE and cognitive-linguistic evaluation. Please see below for details.   Problem List:  Patient Active Problem List  Diagnosis  . Encephalopathy acute  . Community acquired pneumonia  . Diabetes mellitus  . Hyperlipidemia  . Hypertension  . SDH (subdural hematoma)  . Lower extremity edema  . Anemia  . ARF (acute renal failure)  . HTN (hypertension)  . DVT (deep venous thrombosis)  . CKD (chronic kidney disease) stage 3, GFR 30-59 ml/min  . Subdural hematoma  . Acute respiratory failure  . Subdural hemorrhage   Past Medical History:  Past Medical History  Diagnosis Date  . Diabetes mellitus   . Hypertension   . Hyperlipidemia   . Pneumonia   . Subdural hematoma   . Arthritis   . DVT (deep venous thrombosis)   . Incontinence   . Renal disorder     chronic kidney dz stage III   Past Surgical History:  Past Surgical History  Procedure Laterality Date  . No past surgeries    . Insertion of vena cava [name]    . Craniotomy Right 07/23/2012    Procedure: CRANIOTOMY HEMATOMA EVACUATION SUBDURAL;  Surgeon: [name], MD;  Location: [facility];  Service: Neurosurgery;  Laterality: Right;  . Craniotomy N/A 08/06/2012    Procedure: CRANIOTOMY HEMATOMA EVACUATION SUBDURAL;  Surgeon: [name], MD;  Location: [facility];  Service: Neurosurgery;  Laterality: N/A;  Craniectomy for evacuation of subdural hematoma, implantation of bone flap in abdominal wall     Assessment / Plan / Recommendation Clinical Impression  Pt is an 79 y.o. right-handed female with history of diabetes mellitus and peripheral neuropathy,  hypertension and chronic renal insufficiency with baseline 1.80-2.10 as well his recent DVT with IVC [name] placed. Admitted 07/23/2012 with noted history of large right hemispheric chronic subdural hematoma. CT scan showed chronic subdural hematoma with significant mass effect and midline shift. Patient underwent right frontoparietal craniotomy evacuation of subdural hematoma 07/23/2012 per Dr. Newell Coral. Followup cranial CT scan 07/30/2012 showed some increase in size of right sided subdural collection as well as slight increase in size of blood collection. She was admitted to CIR on 07/31/12 and participating with bouts of lethargy noted. Follow-up cranial CT scan was ultimately completed showing increased accumulation of mixed density fluid in the subdural space maximal thickness increased from 14 mm to 21 mm. She was discharged to acute care services of Dr. Newell Coral and underwent right frontal parietal craniectomy, implantation of bone flap and to the right abdominal wall and evacuation of chronic subdural hematoma on 08/06/2012. She remains on Keppra for seizure prophylaxis. Latest follow-up CT scan showed decrease in size of right-sided subdural hematoma as well as decreased midline shift to the left. Pt readmitted to CIR on 08/13/12 and presents with moderate cognitive impairments characterized by decreased orientation, sustained attention, intellectual awareness, functional problem solving, working memory, delayed processing, impulsivity and lethargy which impact pt's overall safety awareness and independence. Pt's overall lethargy also impacts pt's ability to safely masticate regular textures and pt is recommended Dys. 3 textures with thin liquids. Pt's overall speech intelligibility is also impacted by lethargy at the phrase level. Pt would benefit from skilled SLP intervention to maximize cognitive  function, swallowing function with least restrictive diet and overall speech intelligibility. Anticipate pt  will need 24 hour supervision and follow-up home health services.     SLP Assessment  Patient will need skilled Speech Lanaguage Pathology Services during CIR admission    Recommendations  Diet Recommendations: Dysphagia 3 (Mechanical Soft);Thin liquid Liquid Administration via: Cup;Straw Medication Administration: Whole meds with liquid Supervision: Patient able to self feed;Full supervision/cueing for compensatory strategies Compensations: Slow rate;Small sips/bites;Check for pocketing Postural Changes and/or Swallow Maneuvers: Upright 30-60 min after meal;Seated upright 90 degrees Oral Care Recommendations: Oral care BID Patient destination: Home Follow up Recommendations: 24 hour supervision/assistance;Home Health SLP Equipment Recommended: None recommended by SLP    SLP Frequency 5 out of 7 days   SLP Treatment/Interventions Cueing hierarchy;Cognitive remediation/compensation;Internal/external aids;Environmental controls;Functional tasks;Patient/family education;Therapeutic Activities;Dysphagia/aspiration precaution training;Speech/Language facilitation    Pain Pain Assessment Pain Assessment: 0-10 Pain Score:   9 Pain Type: Acute pain Pain Location: Knee Pain Orientation: Left Pain Descriptors: Aching;Other (Comment) (swelling) Pain Frequency: Occasional Pain Onset: Gradual Patients Stated Pain Goal: 2 Pain Intervention(s): Medication (See eMAR) (tylenol 650 mgpo)  Short Term Goals: Week 1: SLP Short Term Goal 1 (Week 1): Pt will utilize small bites and a slow rate with Dys. 3 textures with Min A verbal and tactile cues.  SLP Short Term Goal 2 (Week 1): Pt will utilize external aids and contextual cues to orient to place, time and situation with Min A visual and question cues.  SLP Short Term Goal 3 (Week 1): Pt will demonstrate sustained attention to a functional task for 10 minutes with Min A verbal cues for redirection  SLP Short Term Goal 4 (Week 1): Pt will demonstrate  functional problem solving for basic and familair tasks with Mod A verbal and question cues.  SLP Short Term Goal 5 (Week 1): Pt will utilize call bell to express wants/needs with Mod A question and viusal cues.  SLP Short Term Goal 6 (Week 1): Pt will utilize speech intelligibility strategies at the phrase level with Mod A verbal cues.   See FIM for current functional status Refer to Care Plan for Long Term Goals  Recommendations for other services: Neuropsych  Discharge Criteria: Patient will be discharged from SLP if patient refuses treatment 3 consecutive times without medical reason, if treatment goals not met, if there is a change in medical status, if patient makes no progress towards goals or if patient is discharged from hospital.  The above assessment, treatment plan, treatment alternatives and goals were discussed and mutually agreed upon: by patient  Dessiree Sze 08/14/2012, 9:54 AM

## 2012-08-14 NOTE — Evaluation (Signed)
Occupational Therapy Assessment and Plan  Patient Details  Name: April [name] MRN: [id number] Date of Birth: Apr 05, 1922  OT Diagnosis: cognitive deficits and muscle weakness (generalized) Rehab Potential: Rehab Potential: Good ELOS: 7-10 days   Today's Date: 08/14/2012 Time: 1035-1130 Time Calculation (min): 55 min  Problem List:  Patient Active Problem List  Diagnosis  . Encephalopathy acute  . Community acquired pneumonia  . Diabetes mellitus  . Hyperlipidemia  . Hypertension  . SDH (subdural hematoma)  . Lower extremity edema  . Anemia  . ARF (acute renal failure)  . HTN (hypertension)  . DVT (deep venous thrombosis)  . CKD (chronic kidney disease) stage 3, GFR 30-59 ml/min  . Subdural hematoma  . Acute respiratory failure  . Subdural hemorrhage    Past Medical History:  Past Medical History  Diagnosis Date  . Diabetes mellitus   . Hypertension   . Hyperlipidemia   . Pneumonia   . Subdural hematoma   . Arthritis   . DVT (deep venous thrombosis)   . Incontinence   . Renal disorder     chronic kidney dz stage III   Past Surgical History:  Past Surgical History  Procedure Laterality Date  . No past surgeries    . Insertion of vena cava filter    . Craniotomy Right 07/23/2012    Procedure: CRANIOTOMY HEMATOMA EVACUATION SUBDURAL;  Surgeon: [name], MD;  Location: [facility];  Service: Neurosurgery;  Laterality: Right;  . Craniotomy N/A 08/06/2012    Procedure: CRANIOTOMY HEMATOMA EVACUATION SUBDURAL;  Surgeon: [name], MD;  Location: [facility];  Service: Neurosurgery;  Laterality: N/A;  Craniectomy for evacuation of subdural hematoma, implantation of bone flap in abdominal wall     Assessment & Plan Clinical Impression: Patient is a 79 y.o. right-handed female with history of diabetes mellitus and peripheral neuropathy, hypertension and chronic renal insufficiency with baseline 1.80-2.10 as well his recent DVT with IVC filter placed.  Admitted 07/23/2012 with noted history of large right hemispheric chronic subdural hematoma. She had been seen by neurosurgery in the past and surgeries not felt to be indicated. She now presents with increased left-sided weakness over the past 2 days and followup CT scan again shows chronic subdural hematoma with significant mass effect and midline shift. Patient underwent right frontoparietal craniotomy evacuation of subdural hematoma 07/23/2012 per Dr. Newell Coral. Placed on Keppra for seizure prophylaxis. Followup cranial CT scan 07/30/2012 does show some increase in size of right sided subdural collection as well as slight increase in size of blood collection. Dr. Newell Coral of neurosurgery has reviewed the film and neurologically patient looks good with plan to repeat cranial CT scan in one week. Patient is maintained on a dysphagia 3 thin liquid diet. Latest hemoglobin A1c of 12.3 and currently maintained on oral agents as well as sliding scale insulin. Physical and occupational therapy evaluations completed 07/26/2012 was noted ongoing bouts of confusion, problem solving and memory loss. Recommendations are made for physical medicine rehabilitation consult to consider inpatient rehabilitation services. Patient was felt to be a good candidate for inpatient rehabilitation services and was admitted for a comprehensive rehabilitation program. She was admitted for inpatient rehabilitation services and participating with bouts of lethargy noted. Continue close followup per neurosurgery. Follow cranial CT scan was ultimately completed showing increased accumulation of mixed density fluid in the subdural space maximal thickness increased from 14 mm to 21 mm. She was discharged to acute care services of Dr. Newell Coral and underwent right frontal parietal  craniectomy, implantation of bone flap and to the right abdominal wall and evacuation of chronic subdural hematoma 08/06/2012. She remains on Keppra for seizure  prophylaxis. Latest followup CT scan showed decrease in size of right-sided subdural hematoma as well as decreased midline shift to the left.  Patient transferred to CIR on 08/13/2012 .    Patient currently requires mod with basic self-care skills secondary to muscle weakness, decreased cardiorespiratoy endurance, decreased coordination, decreased attention, decreased awareness, decreased problem solving, decreased safety awareness, decreased memory and delayed processing and decreased standing balance and decreased balance strategies.Prior to hospitalization, patient could complete ADLs with supervision.  Patient will benefit from skilled intervention to decrease level of assist with basic self-care skills and increase independence with basic self-care skills prior to discharge home with care partner.  Anticipate patient will require 24 hour supervision and follow up home health.  OT - End of Session Activity Tolerance: Tolerates 30+ min activity with multiple rests Endurance Deficit: Yes Endurance Deficit Description: decreased arousal, lethargy OT Assessment Rehab Potential: Good OT Plan OT Intensity: Minimum of 1-2 x/day, 45 to 90 minutes OT Frequency: 5 out of 7 days OT Duration/Estimated Length of Stay: 7-10 days OT Treatment/Interventions: Balance/vestibular training;Cognitive remediation/compensation;Discharge planning;DME/adaptive equipment instruction;Community reintegration;Functional mobility training;Pain management;Psychosocial support;Skin care/wound managment;Therapeutic Activities;Patient/family education;Self Care/advanced ADL retraining;Therapeutic Exercise;UE/LE Strength taining/ROM;UE/LE Coordination activities;Neuromuscular re-education OT Recommendation Patient destination: Home Follow Up Recommendations: Home health OT   Skilled Therapeutic Intervention OT eval completed.  ADL assessment completed at sit <> stand level at sink.  Pt asleep upon arrival and required  tactile cues, repositioning, and responded to name to arouse.  Pt with increased c/o pain in Lt knee which impacted her ability to complete transfers at prior level.  Max assist stand pivot to w/c and mod assist sit to stand at sink with consistent mod assist to maintain standing due to decreased weight bearing through LLE because of pain.  Pt reports sister will be present during day to assist while son is at work and that her son's "can fix anything for her".  Pt more alert once sitting and able to follow directions.  OT Evaluation Precautions/Restrictions  Precautions Precautions: Fall Precaution Comments: NO Bone flap right side of skull, two JP drains, Decreased safety awareness, decreased awareness of deficits Restrictions Weight Bearing Restrictions: No General   Vital Signs Therapy Vitals Pulse Rate: 97 BP: 170/63 mmHg Patient Position, if appropriate: Lying Oxygen Therapy SpO2: 95 % O2 Device: None (Room air) Pain Pain Assessment Pain Assessment: 0-10 Pain Score:   9 Pain Type: Acute pain Pain Location: Knee Pain Orientation: Left Pain Descriptors: Aching;Other (Comment) (swelling) Pain Frequency: Occasional Pain Onset: Gradual Patients Stated Pain Goal: 2 Pain Intervention(s): Medication (See eMAR) (tylenol 650 mgpo) Home Living/Prior Functioning Home Living Lives With: Son Available Help at Discharge: Family;Available PRN/intermittently Type of Home: House Home Access: Stairs to enter [employer] of Steps: 3-4 Entrance Stairs-Rails: Right Home Layout: One level Bathroom Shower/Tub: Engineer, manufacturing systems: Standard Additional Comments: When asked about equipment pt answered "yes" to adaptive equipment and "yes" when asked more specifically to shower seat and grab bars.  "My boys can fix up anything" Prior Function Level of Independence: Independent with basic ADLs Able to Take Stairs?: Yes Driving: No Vocation: Retired ADL ADL Grooming:  Moderate assistance Where Assessed-Grooming: Sitting at sink Upper Body Bathing: Supervision/safety Where Assessed-Upper Body Bathing: Sitting at sink Lower Body Bathing: Moderate assistance Where Assessed-Lower Body Bathing: Sitting at sink;Standing at sink Upper Body Dressing: Setup  Where Assessed-Upper Body Dressing: Sitting at sink Lower Body Dressing: Not assessed Toilet Transfer: Moderate assistance Toilet Transfer Method: Squat pivot Vision/Perception  Vision - History Baseline Vision: Wears glasses only for reading Patient Visual Report: No change from baseline Vision - Assessment Eye Alignment: Within Functional Limits Perception Perception: Within Functional Limits Praxis Praxis: Intact  Cognition Overall Cognitive Status: Impaired Arousal/Alertness: Lethargic Orientation Level: Oriented to person;Oriented to place;Disoriented to situation;Disoriented to time Attention: Sustained Sustained Attention: Impaired Sustained Attention Impairment: Verbal basic;Functional basic Memory: Impaired Memory Impairment: Decreased recall of new information;Decreased short term memory Decreased Short Term Memory: Verbal basic;Functional basic Awareness: Impaired Awareness Impairment: Intellectual impairment Problem Solving: Impaired Problem Solving Impairment: Verbal basic;Functional basic Executive Function: Decision Making;Self Monitoring;Self Correcting Decision Making: Impaired Decision Making Impairment: Functional basic Self Monitoring: Impaired Self Monitoring Impairment: Functional basic Self Correcting: Impaired Self Correcting Impairment: Functional basic Behaviors: Impulsive Safety/Judgment: Impaired Sensation Sensation Light Touch: Impaired Detail Light Touch Impaired Details: Impaired LUE Proprioception: Impaired Detail Proprioception Impaired Details: Impaired LUE Coordination Fine Motor Movements are Fluid and Coordinated: No (decreased coordination with  alternating movements) Motor    Mobility  Bed Mobility Supine to Sit: 3: Mod assist;HOB flat Sitting - Scoot to Edge of Bed: 4: Min assist Transfers Sit to Stand: 3: Mod assist Sit to Stand Details (indicate cue type and reason): requires increased time, impaired balance due to posterior lean, pain in Lt knee ?gout  Trunk/Postural Assessment     Balance   Extremity/Trunk Assessment RUE Assessment RUE Assessment: Within Functional Limits LUE Assessment LUE Assessment: Exceptions to [facility] (strength grossly 4/5)  FIM:  FIM - Eating Eating Activity: 4: Helper checks for pocketed food;5: Set-up assist for open containers;5: Supervision/cues FIM - Grooming Grooming Steps: Wash, rinse, dry face;Wash, rinse, dry hands Grooming: 3: Patient completes 2 of 4 or 3 of 5 steps FIM - Bathing Bathing Steps Patient Completed: Chest;Right Arm;Left Arm;Abdomen;Front perineal area;Right upper leg;Left upper leg Bathing: 3: Mod-Patient completes 5-7 52f 10 parts or 50-74% FIM - Upper Body Dressing/Undressing Upper body dressing/undressing steps patient completed: Put head through opening of pull over shirt/dress;Thread/unthread left sleeve of pullover shirt/dress;Thread/unthread right sleeve of pullover shirt/dresss;Pull shirt over trunk Upper body dressing/undressing: 5: Set-up assist to: Obtain clothing/put away FIM - Lower Body Dressing/Undressing Lower body dressing/undressing: 0: Wears gown/pajamas-no public clothing FIM - Toileting Toileting steps completed by patient: Performs perineal hygiene Toileting Assistive Devices: Grab bar or rail for support Toileting: 2: Max-Patient completed 1 of 3 steps FIM - Games developer Transfer: 2: Chair or W/C > Bed: Max A (lift and lower assist);2: Bed > Chair or W/C: Max A (lift and lower assist);3: Sit > Supine: Mod A (lifting assist/Pt. 50-74%/lift 2 legs);3: Supine > Sit: Mod A (lifting assist/Pt. 50-74%/lift 2 legs FIM - Toilet  Transfers Toilet Transfers: 3-To toilet/BSC: Mod A (lift or lower assist);3-From toilet/BSC: Mod A (lift or lower assist)   Refer to Care Plan for Long Term Goals  Recommendations for other services: None  Discharge Criteria: Patient will be discharged from OT if patient refuses treatment 3 consecutive times without medical reason, if treatment goals not met, if there is a change in medical status, if patient makes no progress towards goals or if patient is discharged from hospital.  The above assessment, treatment plan, treatment alternatives and goals were discussed and mutually agreed upon: by patient  Leonette Monarch 08/14/2012, 12:06 PM

## 2012-08-14 NOTE — Progress Notes (Signed)
INITIAL NUTRITION ASSESSMENT  DOCUMENTATION CODES Per approved criteria  -Not Applicable   INTERVENTION: 1. Glucerna Shake po BID, each supplement provides 220 kcal and 10 grams of protein. 2. MVI daily 3. RD to continue to follow nutrition care plan  NUTRITION DIAGNOSIS: Increased nutrient needs related to post-op healing as evidenced by estimated needs.   Goal: Intake to meet >90% of estimated nutrition needs.  Monitor:  weight trends, lab trends, I/O's, PO intake, supplement tolerance  Reason for Assessment: Health History  79 y.o. female  Admitting Dx: Subdural hemorrhage  ASSESSMENT: Admitted 2/17 with increased left-sided weakness x 2 days, followup CT scan showed chronic subdural hematoma with significant mass effect and midline shift. Underwent R frontoparietal craniotomy evacuation of subdural hematoma 2/17.  Pt was admitted to CIR and participating with bouts of lethargy noted. Follow-up cranial CT scan showed increased accumulation of mixed density fluid in the subdural space. She was discharged back to acute care  and underwent R frontal parietal craniectomy, implantation of bone flap and to the right abdominal wall and evacuation of chronic subdural hematoma 3/3. Returned to [facility] on 3/10.  Pt was followed by RD staff during acute hospitalization. She was started on EN during intubation during second acute hospitalization. Extubated on 3/5 - advanced to clear liquids on 3/6.   Admitted to rehab on Regular textures. Downgraded to Mechanical Soft by SLP this morning s/p breakfast this morning. SLP notes that pt does well with liquids. Was able to eat half of eggs and all of bacon, however did not eat muffin or oatmeal. Meal intake is minimal, consuming 10 - 50%. Pt would benefit from oral nutrition supplements given variable meal intake and to promote post-op healing.  Height: Ht Readings from Last 1 Encounters:  08/13/12 5\' 3"  (1.6 m)    Weight: Wt Readings from Last  1 Encounters:  08/13/12 139 lb 5.3 oz (63.2 kg)   Ideal Body Weight: 115 lb  % Ideal Body Weight: 121%  Wt Readings from Last 10 Encounters:  08/13/12 139 lb 5.3 oz (63.2 kg)  08/08/12 146 lb 9.7 oz (66.5 kg)  08/01/12 154 lb 15.7 oz (70.3 kg)  08/08/12 146 lb 9.7 oz (66.5 kg)  07/23/12 151 lb 3.8 oz (68.6 kg)  07/23/12 151 lb 3.8 oz (68.6 kg)  07/04/12 139 lb 8.8 oz (63.3 kg)  06/24/12 137 lb 5.6 oz (62.3 kg)    Usual Body Weight: 140 lb  % Usual Body Weight: 99%  BMI:  Body mass index is 24.69 kg/(m^2). Weight is WNL.  Estimated Nutritional Needs: Kcal: 1400 - 1600 Protein: 75 - 90 Fluid: at least 1.5 liters  Skin: head and abdominal incision  Diet Order: Dysphagia 3; Carb Modified Medium  EDUCATION NEEDS: -No education needs identified at this time   Intake/Output Summary (Last 24 hours) at 08/14/12 0944 Last data filed at 08/14/12 0900  Gross per 24 hour  Intake    360 ml  Output   1400 ml  Net  -1040 ml    Last BM: 3/10  Labs:   Recent Labs Lab 08/08/12 0415 08/09/12 0500 08/10/12 0515 08/11/12 0430 08/12/12 0430 08/13/12 0812  NA 145 149* 148* 146* 144 141  K 3.9 3.5 3.3* 3.1* 3.0* 3.2*  CL 116* 117* 117* 117* 114* 109  CO2 20 21 22 21 21 21   BUN 28* 31* 29* 27* 27* 24*  CREATININE 2.37* 2.24* 1.99* 1.94* 2.00* 1.88*  CALCIUM 8.0* 8.9 9.0 8.6 8.3* 8.4  MG  1.7 1.7 1.9  --   --   --   PHOS 3.9 3.9 3.6  --   --   --   GLUCOSE 126* 119* 110* 148* 170* 194*    CBG (last 3)   Recent Labs  08/13/12 1846 08/13/12 2119 08/14/12 0707  GLUCAP 215* 175* 167*    Scheduled Meds: . amLODipine  10 mg Oral Daily  . [START ON 08/15/2012] cloNIDine  0.3 mg Transdermal Weekly  . insulin aspart  0-15 Units Subcutaneous TID WC  . irbesartan  300 mg Oral Daily  . levETIRAcetam  500 mg Oral BID  . linagliptin  5 mg Oral Q1200    Continuous Infusions:   Past Medical History  Diagnosis Date  . Diabetes mellitus   . Hypertension   .  Hyperlipidemia   . Pneumonia   . Subdural hematoma   . Arthritis   . DVT (deep venous thrombosis)   . Incontinence   . Renal disorder     chronic kidney dz stage III    Past Surgical History  Procedure Laterality Date  . No past surgeries    . Insertion of vena cava filter    . Craniotomy Right 07/23/2012    Procedure: CRANIOTOMY HEMATOMA EVACUATION SUBDURAL;  Surgeon: [name], MD;  Location: [facility];  Service: Neurosurgery;  Laterality: Right;  . Craniotomy N/A 08/06/2012    Procedure: CRANIOTOMY HEMATOMA EVACUATION SUBDURAL;  Surgeon: [name], MD;  Location: [facility];  Service: Neurosurgery;  Laterality: N/A;  Craniectomy for evacuation of subdural hematoma, implantation of bone flap in abdominal wall    Jarold Motto MS, RD, LDN Pager: [phone] After-hours pager: [phone]

## 2012-08-14 NOTE — Progress Notes (Signed)
Occupational Therapy Session Note  Patient Details  Name: April [name] MRN: [id number] Date of Birth: 05/06/1922  Today's Date: 08/14/2012 Time: 1300-1330 Time Calculation (min): 30 min  Short Term Goals: Week 1:  OT Short Term Goal 1 (Week 1): STG=LTG; overall supervision  Skilled Therapeutic Interventions/Progress Updates:  Balance/vestibular training;Cognitive remediation/compensation;Discharge planning;DME/adaptive equipment instruction;Community reintegration;Functional mobility training;Pain management;Psychosocial support;Skin care/wound managment;Therapeutic Activities;Patient/family education;Self Care/advanced ADL retraining;Therapeutic Exercise;UE/LE Strength taining/ROM;UE/LE Coordination activities;Neuromuscular re-education   1:1 focus on transfers, bed mobility, orientation, awareness, simple problem solving with grooming tasks at sink. With transfers, pt able to transfer with min A (stand pivot) however was not able to bear any weight through left LE due to knee pain. Pt not oriented to date, time, place or situation.   Therapy Documentation Precautions:  Precautions Precautions: Fall Precaution Comments: NO Bone flap right side of skull, two JP drains, Decreased safety awareness, decreased awareness of deficits Restrictions Weight Bearing Restrictions: No Pain: Pain Assessment Pain Score:   9 Pain Type: Acute pain Pain Location: Knee Pain Orientation: Left Pain Descriptors: Aching Pain Onset: On-going Pain Intervention(s): RN made aware;Repositioned ADL:  See FIM for current functional status  Therapy/Group: Individual Therapy  [name] [place] [facility] 08/14/2012, 3:25 PM

## 2012-08-15 ENCOUNTER — Inpatient Hospital Stay (HOSPITAL_COMMUNITY): Payer: Medicare Other | Admitting: Physical Therapy

## 2012-08-15 ENCOUNTER — Inpatient Hospital Stay (HOSPITAL_COMMUNITY): Payer: Medicare Other | Admitting: Occupational Therapy

## 2012-08-15 ENCOUNTER — Inpatient Hospital Stay (HOSPITAL_COMMUNITY): Payer: Medicare Other | Admitting: Speech Pathology

## 2012-08-15 DIAGNOSIS — S065XAA Traumatic subdural hemorrhage with loss of consciousness status unknown, initial encounter: Secondary | ICD-10-CM

## 2012-08-15 DIAGNOSIS — E1165 Type 2 diabetes mellitus with hyperglycemia: Secondary | ICD-10-CM

## 2012-08-15 DIAGNOSIS — IMO0001 Reserved for inherently not codable concepts without codable children: Secondary | ICD-10-CM

## 2012-08-15 DIAGNOSIS — W19XXXA Unspecified fall, initial encounter: Secondary | ICD-10-CM

## 2012-08-15 DIAGNOSIS — I10 Essential (primary) hypertension: Secondary | ICD-10-CM

## 2012-08-15 DIAGNOSIS — S065X9A Traumatic subdural hemorrhage with loss of consciousness of unspecified duration, initial encounter: Secondary | ICD-10-CM

## 2012-08-15 LAB — GLUCOSE, CAPILLARY
Glucose-Capillary: 304 mg/dL — ABNORMAL HIGH (ref 70–99)
Glucose-Capillary: 308 mg/dL — ABNORMAL HIGH (ref 70–99)
Glucose-Capillary: 237 mg/dL — ABNORMAL HIGH (ref 70–99)

## 2012-08-15 LAB — BASIC METABOLIC PANEL
Calcium: 8.6 mg/dL (ref 8.4–10.5)
Creatinine, Ser: 2.02 mg/dL — ABNORMAL HIGH (ref 0.50–1.10)
GFR calc non Af Amer: 21 mL/min — ABNORMAL LOW (ref >90)
Glucose, Bld: 217 mg/dL — ABNORMAL HIGH (ref 70–99)
Sodium: 138 mEq/L (ref 135–145)

## 2012-08-15 MED ORDER — INSULIN ASPART 100 UNIT/ML ~~LOC~~ SOLN
8.0000 [IU] | Freq: Once | SUBCUTANEOUS | Status: AC
  Administered 2012-08-15: 8 [IU] via SUBCUTANEOUS

## 2012-08-15 MED ORDER — METOPROLOL TARTRATE 12.5 MG HALF TABLET
12.5000 mg | ORAL_TABLET | Freq: Two times a day (BID) | ORAL | Status: DC
  Administered 2012-08-15 – 2012-08-18 (×7): 12.5 mg via ORAL
  Filled 2012-08-15 (×11): qty 1

## 2012-08-15 NOTE — Progress Notes (Signed)
Social Work  Social Work Assessment and Plan  Patient Details  Name: April [name] MRN: [id number] Date of Birth: 11/21/1921  Today's Date: 08/15/2012  Problem List:  Patient Active Problem List  Diagnosis  . Encephalopathy acute  . Community acquired pneumonia  . Diabetes mellitus  . Hyperlipidemia  . Hypertension  . SDH (subdural hematoma)  . Lower extremity edema  . Anemia  . ARF (acute renal failure)  . HTN (hypertension)  . DVT (deep venous thrombosis)  . CKD (chronic kidney disease) stage 3, GFR 30-59 ml/min  . Subdural hematoma  . Acute respiratory failure  . Subdural hemorrhage   Past Medical History:  Past Medical History  Diagnosis Date  . Diabetes mellitus   . Hypertension   . Hyperlipidemia   . Pneumonia   . Subdural hematoma   . Arthritis   . DVT (deep venous thrombosis)   . Incontinence   . Renal disorder     chronic kidney dz stage III   Past Surgical History:  Past Surgical History  Procedure Laterality Date  . No past surgeries    . Insertion of vena cava filter    . Craniotomy Right 07/23/2012    Procedure: CRANIOTOMY HEMATOMA EVACUATION SUBDURAL;  Surgeon: [name], MD;  Location: [facility];  Service: Neurosurgery;  Laterality: Right;  . Craniotomy N/A 08/06/2012    Procedure: CRANIOTOMY HEMATOMA EVACUATION SUBDURAL;  Surgeon: [name], MD;  Location: [facility];  Service: Neurosurgery;  Laterality: N/A;  Craniectomy for evacuation of subdural hematoma, implantation of bone flap in abdominal wall    Social History:  reports that she has never smoked. She has never used smokeless tobacco. She reports that she does not drink alcohol or use illicit drugs.  Family / Support Systems Marital Status: Widow/Widower How Long?: 10 yrs Patient Roles: Parent Children: son, Dari Carpenito @ [phone] or (C) [phone] plus three other adult children all living "up [place]" ([place] and [place]) Other Supports: son has three adult daughters with  one lving with them  Anticipated Caregiver: son and his daughter's to "share" 24/7 assist Ability/Limitations of Caregiver: no limitations Caregiver Availability: 24/7 Family Dynamics: strained relationship between son and other family members living up [place] which is "the reason" he chose to bring pt to live with him in [place]  Social History Preferred language: English Religion: Baptist Cultural Background: NA Education: HS Read: Yes Write: Yes Employment Status: Retired Fish farm manager Issues: none Guardian/Conservator: none   Abuse/Neglect Physical Abuse: Denies Verbal Abuse: Denies Sexual Abuse: Denies Exploitation of patient/patient's resources: Denies Self-Neglect: Denies  Emotional Status Pt's affect, behavior adn adjustment status: Pt can answer very basic personal demographic inforrmation.  Occasionally gets information incorrect but is beginning to self-correct.   No s/s of any significant emotional distress.  will monitor but does not appear that a formal depression screen is indicated. Recent Psychosocial Issues: Recently moved to [place] with son from her home with a grandson in [place]. Pyschiatric History: none  Patient / Family Perceptions, Expectations & Goals Pt/Family understanding of illness & functional limitations: son able to provide informed report of pt's acute/ subacute SDH as he has understood the information from the neurosurgeon.  Good understanding of current functional limitations and feels her cognition has continued to improve Premorbid pt/family roles/activities: Son notes that pt was independent with mobility and that she would often accompany him when he ran errands.  Rarley left alone at home. Anticipated changes in roles/activities/participation: very little change  anticipated from what pt was requiring in assistance PTA.  Son to resume caregiver role Pt/family expectations/goals: Son hopeful her coginition will return close to baseline  function.  Community Resources [employer]: None Premorbid Home Care/DME Agencies: None Transportation available at discharge: yes Resource referrals recommended: Neuropsychology  Discharge Planning Living Arrangements: Children;Other relatives Support Systems: Children;Other relatives Type of Residence: Private residence Insurance Resources: Administrator (specify) Financial Resources: Restaurant manager, fast food Screen Referred: No Living Expenses: Lives with family Money Management: Family Do you have any problems obtaining your medications?: No Home Management: son and Counselling psychologist Preliminary Plans: Pt expects to return home with son and grandtr who will coordinate 24/7 assistance Social Work Anticipated Follow Up Needs: HH/OP Expected length of stay: ELOS 7 days  Clinical Impression Returning patient - have confirmed with pt and son that personal information unchanged.  Dc plan remains that pt will d/c to son's home where he will provide 24/7 supervision. No emotional distress noted - will monitor.  HOYLE, [name] 08/15/2012, 4:07 PM

## 2012-08-15 NOTE — Progress Notes (Signed)
Subjective: Patient sitting up in bed, without complaints. Denies headache.  Objective: Vital signs in last 24 hours: Filed Vitals:   08/14/12 0820 08/14/12 1040 08/14/12 1600 08/15/12 0454  BP: 170/90 170/63 179/76 179/84  Pulse:  97 101 105  Temp:    98.8 F (37.1 C)  TempSrc:    Oral  Resp:   20 17  Height:      Weight:      SpO2:  95% 98% 93%    Intake/Output from previous day: 03/11 0701 - 03/12 0700 In: 702 [P.O.:702] Out: 1551 [Urine:1550; Emesis/NG output:1] Intake/Output this shift:    Physical Exam:  Awake alert, oriented to name and hospital, but not to year. Incision healing nicely, no erythema, swelling, or drainage. Moving all 4 extremities well.  CBC  Recent Labs  08/14/12 0915  WBC 7.7  HGB 8.8*  HCT 25.8*  PLT 267   BMET  Recent Labs  08/13/12 0812 08/14/12 0915  NA 141 139  K 3.2* 3.0*  CL 109 107  CO2 21 20  GLUCOSE 194* 182*  BUN 24* 24*  CREATININE 1.88* 1.95*  CALCIUM 8.4 8.5    Studies/Results: Dg Knee 1-2 Views Left  08/14/2012  *RADIOLOGY REPORT*  Clinical Data: Increased right knee pain  LEFT KNEE - 1-2 VIEW  Comparison: None  Findings: There is narrowing of the medial compartment.  There is mild chondrocalcinosis of the medial compartment.  No joint effusion.  Vascular calcifications noted.  IMPRESSION:  1.  Mild osteoarthritis of the medial compartment. 2.  Suggestion of mild chondrocalcinosis.   Original Report Authenticated By: Genevive Bi, M.D.     Assessment/Plan: Continuing rehabilitation. Discussed with rehabilitation staff that I do not feel the patient would benefit from protective helmet, and in fact it makes scalp hygiene more difficult and stigmatizes patient. Scheduled for followup CT brain without contrast on Monday, March 17th.   [name], MD 08/15/2012, 7:54 AM

## 2012-08-15 NOTE — Progress Notes (Signed)
Occupational Therapy Note  Patient Details  Name: April [name] MRN: [id number] Date of Birth: 10-16-1921 Today's Date: 08/15/2012  [employer] with SLP [phone]  No c/o pain Self feeding group with focus on attention to task, remain alert, simple problem solving, encouragement of PO intake, orientation and socialization with peers.   [name] [facility] 08/15/2012, 1:09 PM

## 2012-08-15 NOTE — Progress Notes (Signed)
Subjective/Complaints: A little slow to arouse this am. No specific complaints. Therapy reported a lot of pain in left knee yesterday.. A 12 point review of systems has been performed and if not noted above is otherwise negative.   Objective: Vital Signs: Blood pressure 179/84, pulse 105, temperature 98.8 F (37.1 C), temperature source Oral, resp. rate 17, height 5\' 3"  (1.6 m), weight 63.2 kg (139 lb 5.3 oz), SpO2 93.00%. Dg Knee 1-2 Views Left  08/14/2012  *RADIOLOGY REPORT*  Clinical Data: Increased right knee pain  LEFT KNEE - 1-2 VIEW  Comparison: None  Findings: There is narrowing of the medial compartment.  There is mild chondrocalcinosis of the medial compartment.  No joint effusion.  Vascular calcifications noted.  IMPRESSION:  1.  Mild osteoarthritis of the medial compartment. 2.  Suggestion of mild chondrocalcinosis.   Original Report Authenticated By: Genevive Bi, M.D.     Recent Labs  08/14/12 0915  WBC 7.7  HGB 8.8*  HCT 25.8*  PLT 267    Recent Labs  08/13/12 0812 08/14/12 0915  NA 141 139  K 3.2* 3.0*  CL 109 107  GLUCOSE 194* 182*  BUN 24* 24*  CREATININE 1.88* 1.95*  CALCIUM 8.4 8.5   CBG (last 3)   Recent Labs  08/14/12 1616 08/14/12 2055 08/15/12 0705  GLUCAP 250* 191* 206*    Wt Readings from Last 3 Encounters:  08/13/12 63.2 kg (139 lb 5.3 oz)  08/08/12 66.5 kg (146 lb 9.7 oz)  08/01/12 70.3 kg (154 lb 15.7 oz)    Physical Exam:  HENT: dentition fair Staples intact to craniotomy site. No drainage noted.  Eyes: EOM are normal.  Neck: Neck supple. No thyromegaly present.  Cardiovascular: Regular rhythm. No murmurs, rubs, gallops  Pulmonary/Chest: Effort normal and breath sounds normal. She has no wheezes.  Abdominal: Soft. Bowel sounds are normal. She exhibits no distension.  Neurological: She is fairly alert  She did follow simple commands. She did need frequent redirection to tasks.She could name familiar objects. Continued  impairment of fine motor movements of the left arm, leg. No focal CN findings, Left upper ext grossly 4/5 proximal to distal. LLE 4/5 prox to 4+ distally. Limited insight and awareness. No gross visual-spatial deficits.  Musc: left knee tender with ROM. Slight swelling     Assessment/Plan: 1. Functional deficits secondary to acute on chronic right fronto-parietal SDH which require 3+ hours per day of interdisciplinary therapy in a comprehensive inpatient rehab setting. Physiatrist is providing close team supervision and 24 hour management of active medical problems listed below. Physiatrist and rehab team continue to assess barriers to discharge/monitor patient progress toward functional and medical goals.      FIM: FIM - Bathing Bathing Steps Patient Completed: Chest;Right Arm;Left Arm;Abdomen;Front perineal area;Right upper leg;Left upper leg Bathing: 3: Mod-Patient completes 5-7 22f 10 parts or 50-74%  FIM - Upper Body Dressing/Undressing Upper body dressing/undressing steps patient completed: Put head through opening of pull over shirt/dress;Thread/unthread left sleeve of pullover shirt/dress;Thread/unthread right sleeve of pullover shirt/dresss;Pull shirt over trunk Upper body dressing/undressing: 5: Set-up assist to: Obtain clothing/put away FIM - Lower Body Dressing/Undressing Lower body dressing/undressing: 0: Wears gown/pajamas-no public clothing  FIM - Toileting Toileting steps completed by patient: Performs perineal hygiene Toileting Assistive Devices: Grab bar or rail for support Toileting: 2: Max-Patient completed 1 of 3 steps  FIM - Archivist Transfers: 3-To toilet/BSC: Mod A (lift or lower assist);3-From toilet/BSC: Mod A (lift or lower assist)  FIM -  Bed/Chair Transfer Bed/Chair Transfer: 2: Chair or W/C > Bed: Max A (lift and lower assist);2: Bed > Chair or W/C: Max A (lift and lower assist);3: Sit > Supine: Mod A (lifting assist/Pt. 50-74%/lift 2  legs);3: Supine > Sit: Mod A (lifting assist/Pt. 50-74%/lift 2 legs  FIM - Locomotion: Wheelchair Distance: 75 Locomotion: Wheelchair: 2: Travels 50 - 149 ft with minimal assistance (Pt.>75%) FIM - Locomotion: Ambulation Ambulation/Gait Assistance: 2: Max assist Locomotion: Ambulation: 1: Travels less than 50 ft with maximal assistance (Pt: 25 - 49%)  Comprehension Comprehension Mode: Auditory Comprehension: 3-Understands basic 50 - 74% of the time/requires cueing 25 - 50%  of the time  Expression Expression Mode: Verbal Expression: 2-Expresses basic 25 - 49% of the time/requires cueing 50 - 75% of the time. Uses single words/gestures.  Social Interaction Social Interaction: 2-Interacts appropriately 25 - 49% of time - Needs frequent redirection.  Problem Solving Problem Solving: 2-Solves basic 25 - 49% of the time - needs direction more than half the time to initiate, plan or complete simple activities  Memory Memory: 2-Recognizes or recalls 25 - 49% of the time/requires cueing 51 - 75% of the time  Medical Problem List and Plan:  1. Acute on chronic right subdural hematoma. Status post right frontal parietal craniotomy 07/13/2012 with recurrent chronic subdural hematoma status post right frontal parietal craniectomy implantation of bone flap into right abdominal wall evacuation of chronic subdural hematoma 08/06/2012  2. DVT Prophylaxis/Anticoagulation: SCDs. Monitor for any signs of DVT. Bilateral Doppler studies 07/03/2012 negative  3. Pain Management: Presently with Tylenol only. Monitor with increased mobility  4. Neuropsych: This patient is not capable of making decisions on his/her own behalf.  5. Seizure prophylaxis. Keppra 500 mg twice a day  6. Diabetes mellitus with peripheral neuropathy. Latest hemoglobin A1c of 12.3.Tradjenta 5 mg daily. SSI for spikes while on steroid 7. Hypertension. Avapro 300 mg daily, Norvasc 10 mg daily, clonidine patch 0.3 mg change every 7 days.  Monitor the increased mobility. Add low dose metoprolol 8. Hyperlipidemia. Lipitor  9. Pseudogout: prednisone   LOS (Days) 2 A FACE TO FACE EVALUATION WAS PERFORMED  Ciearra Rufo T 08/15/2012 7:52 AM

## 2012-08-15 NOTE — Progress Notes (Signed)
Physical Therapy Note  Patient Details  Name: Edynn Gillock MRN: [id number] Date of Birth: [birth year] Today's Date: 08/15/2012  Time: 800-900 60 minutes  No c/o pain today.  Pt able to perform bed mobility and transfers with min A, no c/o L knee pain.  Gait with RW 50' x 2 with min A, cues for safety and sequencing with RW, cues to stay close to RW.  Slow cadence but no LOB.  Stair training with B handrails min A x 6 stairs with cues for sequencing and safety.  Bathroom mobility and transfers with grab bar, standing balance for hygiene with min A, cues for sequencing.  W/c mobility with min A, cues for attention to task in distracting environment.  Pt requires increased time for problem solving but much improved mobility today due to decreased pain.  Individual therapy   Joclyn Alsobrook 08/15/2012, 8:59 AM

## 2012-08-15 NOTE — Progress Notes (Signed)
Speech Language Pathology Daily Session Note  Patient Details  Name: April [name] MRN: [id number] Date of Birth: [birth year]  Today's Date: 08/15/2012 Time: 0900-0930 Time Calculation (min): 30 min  Short Term Goals: Week 1: SLP Short Term Goal 1 (Week 1): Pt will utilize small bites and a slow rate with Dys. 3 textures with Min A verbal and tactile cues.  SLP Short Term Goal 2 (Week 1): Pt will utilize external aids and contextual cues to orient to place, time and situation with Min A visual and question cues.  SLP Short Term Goal 3 (Week 1): Pt will demonstrate sustained attention to a functional task for 10 minutes with Min A verbal cues for redirection  SLP Short Term Goal 4 (Week 1): Pt will demonstrate functional problem solving for basic and familair tasks with Mod A verbal and question cues.  SLP Short Term Goal 5 (Week 1): Pt will utilize call bell to express wants/needs with Mod A question and viusal cues.  SLP Short Term Goal 6 (Week 1): Pt will utilize speech intelligibility strategies at the phrase level with Mod A verbal cues.   Skilled Therapeutic Interventions: Cognitive treatment focused on orientation, attention, awareness and problem solving.  Pt. exhibits delayed processing and requires extra time intermittently for responses.  Max visual cues for orientation to her birthday (convinced birth month was [birth year] even after verbal cues then later corrextly stated September).  She required max visual assist to locate calendar during temporal orientation task and exhibited poor storage/recall of information.  Pt stated "something happened to my brain."  SLP posted visual cues in room to increase intellectual awareness (brain bleeding and had surgery).  One mild delyaed cough present after consumption of 4 pills (separately with water) with RN.       FIM:  Comprehension Comprehension: 3-Understands basic 50 - 74% of the time/requires cueing 25 - 50%  of the  time Expression Expression Mode: Verbal Expression: 3-Expresses basic 50 - 74% of the time/requires cueing 25 - 50% of the time. Needs to repeat parts of sentences. Social Interaction Social Interaction: 2-Interacts appropriately 25 - 49% of time - Needs frequent redirection. Problem Solving Problem Solving: 1-Solves basic less than 25% of the time - needs direction nearly all the time or does not effectively solve problems and may need a restraint for safety Memory Memory: 2-Recognizes or recalls 25 - 49% of the time/requires cueing 51 - 75% of the time  Pain Pain Assessment Pain Assessment: No/denies pain  Therapy/Group: Individual Therapy  Royce Macadamia 08/15/2012, 11:54 AM

## 2012-08-15 NOTE — Patient Care Conference (Signed)
Inpatient RehabilitationTeam Conference and Plan of Care Update Date: 08/14/2012   Time: 3:00 PM    Patient Name: April [name]      Medical Record Number: [id number]  Date of Birth: [birth year] Sex: Female         Room/Bed: 4004/4004-01 Payor Info: Payor: MEDICARE  Plan: MEDICARE PART A AND B  Product Type: *No Product type*     Admitting Diagnosis: R SDH  Admit Date/Time:  08/13/2012  4:52 PM Admission Comments: No comment available   Primary Diagnosis:  Subdural hemorrhage Principal Problem: Subdural hemorrhage  Patient Active Problem List   Diagnosis Date Noted  . Subdural hemorrhage 08/14/2012  . Acute respiratory failure 08/06/2012  . Subdural hematoma 07/30/2012  . DVT (deep venous thrombosis) 07/04/2012  . CKD (chronic kidney disease) stage 3, GFR 30-59 ml/min 07/04/2012  . Lower extremity edema 07/03/2012  . Anemia 07/03/2012  . ARF (acute renal failure) 07/03/2012  . HTN (hypertension) 07/03/2012  . SDH (subdural hematoma) 06/21/2012  . Encephalopathy acute 06/20/2012  . Community acquired pneumonia 06/20/2012  . Diabetes mellitus 06/20/2012  . Hyperlipidemia 06/20/2012  . Hypertension 06/20/2012    Expected Discharge Date: Expected Discharge Date: 08/22/12  Team Members Present: Physician leading conference: Dr. Faith Rogue Nurse Present: Carmie End, RN PT Present: Reggy Eye, PT OT Present: [name], Loistine Chance, OT;Ardis Rowan, COTA SLP Present: Feliberto Gottron, SLP     Current Status/Progress Goal Weekly Team Focus  Medical   returned after reaccumulation of blood. had craniectomy,   decrease pain, rx pseudogout  increase balance, decrease pain   Bowel/Bladder   incontinent of bowel lbm 3-10 unable to void  requires in and out caths every 8 hours no void  min assist   establish bladder program    Swallow/Nutrition/ Hydration   Dys. 3 textures with Max A for utilization of compensatory strategies  Min A with least restrictive diet  slow  rate with small bites for self-feeding   ADL's   mod assist bathing, setup UB dressing, unable to assess LB dressing due to no clothes, mod-max assist transfers due to pain in Lt knee  supervision   sit <> stand, standing balance, awareness, problem solving, orientation   Mobility   mod-max A  supervision  mobility, gait, awareness   Communication   Mod A  Min A  over articulation to increase intelligibility   Safety/Cognition/ Behavioral Observations  max A  Min-Mod A  attention, working memory, problem solving   Pain   complains of headache tylenol 650 mgpo every 4 hours prn   less than 3   monitor effectiveness of pain meds    Skin   rt scalp incision clean dry intact/healing/bone flap rt abdomen/skin tear left shin  no new skin breakdown or infection       Rehab Goals Patient on target to meet rehab goals: Yes *See Interdisciplinary Assessment and Plan and progress notes for long and short-term goals  Barriers to Discharge: see prior, pain     Possible Resolutions to Barriers:  pain mgt    Discharge Planning/Teaching Needs:  home with son and grandaughter who can provide 24/7 assistance.      Team Discussion:  Mild OA and pseudo-gout; new (re)eval with supervision goals.  No concerns at this point  Revisions to Treatment Plan:  No changes   Continued Need for Acute Rehabilitation Level of Care: The patient requires daily medical management by a physician with specialized training in physical medicine and rehabilitation for the  following conditions: Daily direction of a multidisciplinary physical rehabilitation program to ensure safe treatment while eliciting the highest outcome that is of practical value to the patient.: Yes Daily medical management of patient stability for increased activity during participation in an intensive rehabilitation regime.: Yes Daily analysis of laboratory values and/or radiology reports with any subsequent need for medication adjustment of  medical intervention for : Other;Neurological problems (pseudogout)  April [name] 08/15/2012, 4:15 PM

## 2012-08-15 NOTE — Progress Notes (Signed)
Social Work Patient ID: April [name], female   DOB: [birth year], 79 y.o.   MRN: [id number]  Have reviewed team conference with pt's son. He is aware and agreeable with targeted d/c date of 3/19 with supervision goals.  Reports he and his daughters are prepared to provide this 24/7 care.  Son remains very involved and attentive.  Aldon Hengst, LCSW

## 2012-08-15 NOTE — Progress Notes (Signed)
Occupational Therapy Session Note  Patient Details  Name: April [name] MRN: [id number] Date of Birth: 1922-06-02  Today's Date: 08/15/2012 Time: 1330-1400 Time Calculation (min): 30 min  Short Term Goals: Week 1:  OT Short Term Goal 1 (Week 1): STG=LTG; overall supervision  Skilled Therapeutic Interventions/Progress Updates:    1:1 focus on functional ambulation with RW through obstacle course of stepping over obstacles, weaving in and out of cones in a minimal distracting environment; following directions; demonstrating alertness throughout session and sustained attention with supervision.   Therapy Documentation Precautions:  Precautions Precautions: Fall Precaution Comments: NO Bone flap right side of skull, two JP drains, Decreased safety awareness, decreased awareness of deficits Restrictions Weight Bearing Restrictions: No Pain: Pain Assessment Pain Assessment: No/denies pain  See FIM for current functional status  Therapy/Group: Individual Therapy  [name] [facility] 08/15/2012, 3:59 PM

## 2012-08-15 NOTE — Progress Notes (Signed)
Occupational Therapy Session Note  Patient Details  Name: April [name] MRN: [id number] Date of Birth: 1922/02/09  Today's Date: 08/15/2012 Time: 1000-1055 Time Calculation (min): 55 min  Short Term Goals: Week 1:  OT Short Term Goal 1 (Week 1): STG=LTG; overall supervision  Skilled Therapeutic Interventions/Progress Updates:    1:1 self care retraining at shower level. Focus on sit to stand, standing balance, functional ambulation with and without RW, simple problem solving, sequencing, organization, attention, safety awareness. Pt required max cuing during bath for sequencing; and to terminate bathing task (perservation on washing). Pt able to weight bear through left LE more than yesterday but requires UE support to compensate for discomfort. Sit to stands with tactile cue for forward weight shift. Reviewed orientation information. Pt disoriented x4 requiring total A to be oriented. Able to hold on to information for 3 min.   Therapy Documentation Precautions:  Precautions Precautions: Fall Precaution Comments: NO Bone flap right side of skull, two JP drains, Decreased safety awareness, decreased awareness of deficits Restrictions Weight Bearing Restrictions: No Pain:  no c/o pain in session    See FIM for current functional status  Therapy/Group: Individual Therapy  [name] [facility] 08/15/2012, 11:10 AM

## 2012-08-15 NOTE — Progress Notes (Signed)
Inpatient Rehabilitation Center Individual Statement of Services  Patient Name:  April [name]  Date:  08/15/2012  Welcome to the Inpatient Rehabilitation Center.  Our goal is to provide you with an individualized program based on your diagnosis and situation, designed to meet your specific needs.  With this comprehensive rehabilitation program, you will be expected to participate in at least 3 hours of rehabilitation therapies Monday-Friday, with modified therapy programming on the weekends.  Your rehabilitation program will include the following services:  Physical Therapy (PT), Occupational Therapy (OT), Speech Therapy (ST), 24 hour per day rehabilitation nursing, Therapeutic Recreaction (TR), Neuropsychology, Case Management ( Social Worker), Rehabilitation Medicine, Nutrition Services and Pharmacy Services  Weekly team conferences will be held on Tuesdays to discuss your progress.  Your  Social Worker will talk with you frequently to get your input and to update you on team discussions.  Team conferences with you and your family in attendance may also be held.  Expected length of stay: 7-10 days  Overall anticipated outcome: supervision  Depending on your progress and recovery, your program may change.  Your  Social Worker will coordinate services and will keep you informed of any changes.  Your  Social Worker's name and contact numbers are listed  below.  The following services may also be recommended but are not provided by the Inpatient Rehabilitation Center:    Home Health Rehabiltiation Services  Outpatient Rehabilitatation Servives    Arrangements will be made to provide these services after discharge if needed.  Arrangements include referral to agencies that provide these services.  Your insurance has been verified to be:  Medicare, [employer] and [employer] primary doctor is:  TBA  Pertinent information will be shared with your doctor and your insurance  company.  Social Worker:  [place], [place] [phone] or ([phone]  Information discussed with and copy given to patient by: April [name], 08/15/2012, 4:08 PM

## 2012-08-15 NOTE — Progress Notes (Signed)
Speech Language Pathology Daily Session Note  Patient Details  Name: April [name] MRN: [id number] Date of Birth: 01-May-1922  Today's Date: 08/15/2012 Time: 1200-1210 Time Calculation (min): 10 min   Short Term Goals: Week 1: SLP Short Term Goal 1 (Week 1): Pt will utilize small bites and a slow rate with Dys. 3 textures with Min A verbal and tactile cues.  SLP Short Term Goal 2 (Week 1): Pt will utilize external aids and contextual cues to orient to place, time and situation with Min A visual and question cues.  SLP Short Term Goal 3 (Week 1): Pt will demonstrate sustained attention to a functional task for 10 minutes with Min A verbal cues for redirection  SLP Short Term Goal 4 (Week 1): Pt will demonstrate functional problem solving for basic and familair tasks with Mod A verbal and question cues.  SLP Short Term Goal 5 (Week 1): Pt will utilize call bell to express wants/needs with Mod A question and viusal cues.  SLP Short Term Goal 6 (Week 1): Pt will utilize speech intelligibility strategies at the phrase level with Mod A verbal cues.   Skilled Therapeutic Interventions: Patient participated in group, co-treatment session with OT.  SLP facilitated session by targeting dysphagia and cognitive goals.  Patient consumed recommended/ordered diet with min assist clinician and cues to self feed as well as mod assist clinician cues to attend to task.  Patient exhibited no overt s/s of aspiration.  Continue with current plan of care.   FIM:  Comprehension Comprehension: 3-Understands basic 50 - 74% of the time/requires cueing 25 - 50%  of the time Expression Expression Mode: Verbal Expression: 3-Expresses basic 50 - 74% of the time/requires cueing 25 - 50% of the time. Needs to repeat parts of sentences. Social Interaction Social Interaction: 2-Interacts appropriately 25 - 49% of time - Needs frequent redirection. Problem Solving Problem Solving: 1-Solves basic less than 25% of the time -  needs direction nearly all the time or does not effectively solve problems and may need a restraint for safety Memory Memory: 2-Recognizes or recalls 25 - 49% of the time/requires cueing 51 - 75% of the time FIM - Eating Eating Activity: 4: Helper checks for pocketed food;4: Helper occasionally scoops food on utensil  Pain Pain Assessment Pain Assessment: No/denies pain  Therapy/Group: Group Therapy  Charlane Ferretti., CCC-SLP [phone]  April [name] 08/15/2012, 1:11 PM

## 2012-08-16 ENCOUNTER — Inpatient Hospital Stay (HOSPITAL_COMMUNITY): Payer: Medicare Other | Admitting: Occupational Therapy

## 2012-08-16 ENCOUNTER — Inpatient Hospital Stay (HOSPITAL_COMMUNITY): Payer: Medicare Other | Admitting: Physical Therapy

## 2012-08-16 ENCOUNTER — Inpatient Hospital Stay (HOSPITAL_COMMUNITY): Payer: Medicare Other | Admitting: Speech Pathology

## 2012-08-16 ENCOUNTER — Inpatient Hospital Stay (HOSPITAL_COMMUNITY): Payer: Medicare Other

## 2012-08-16 ENCOUNTER — Encounter (HOSPITAL_COMMUNITY): Payer: Medicare Other | Admitting: Occupational Therapy

## 2012-08-16 LAB — GLUCOSE, CAPILLARY
Glucose-Capillary: 223 mg/dL — ABNORMAL HIGH (ref 70–99)
Glucose-Capillary: 276 mg/dL — ABNORMAL HIGH (ref 70–99)
Glucose-Capillary: 256 mg/dL — ABNORMAL HIGH (ref 70–99)

## 2012-08-16 LAB — URINE CULTURE: Colony Count: >=100000

## 2012-08-16 MED ORDER — PREDNISONE 10 MG PO TABS
10.0000 mg | ORAL_TABLET | Freq: Every day | ORAL | Status: AC
  Administered 2012-08-16 – 2012-08-17 (×2): 10 mg via ORAL
  Filled 2012-08-16 (×2): qty 1

## 2012-08-16 NOTE — Progress Notes (Signed)
Nursing Note: Bedtime cbg =308.Pt had been 304 at supper and received 11 units of novolog SSI.A: Paged on-call Pam Love,PA and received a one time order dor 8 units of novolog now and to re-check cbg at 11 pm and 0400.wbb

## 2012-08-16 NOTE — Progress Notes (Signed)
Social Work Patient ID: April [name], female   DOB: 06/02/1922, 79 y.o.   MRN: [id number]  Alerted by therapies that pt making good gains and they feel she will be ready for d/c sooner than original target d/c.  Recommending d/c 3/15.  Have confirmed with MD that she is medically cleared for earlier d/c.  Have discussed with son who is in agreement.  Will make HH and DME referrals as recommended.  HOYLE, [name], LCSW

## 2012-08-16 NOTE — Progress Notes (Signed)
Physical Therapy Session Note  Patient Details  Name: April [name] MRN: [id number] Date of Birth: [birth year]  Today's Date: 08/16/2012 Time: 7829-5621 Time Calculation (min): 26 min  Skilled Therapeutic Interventions/Progress Updates:    Treatment focused on functional transfers in pt room and administered Berg Balance test (see results below). Discussed results with pt who verbalized understanding of recommendation to use RW for mobility at this time.   Therapy Documentation Precautions:  Precautions Precautions: Fall Precaution Comments: NO Bone flap right side of skull, two JP drains, Decreased safety awareness, decreased awareness of deficits Restrictions Weight Bearing Restrictions: No   Pain: Pain Assessment Pain Assessment: No/denies pain    Balance: Berg Balance Test Sit to Stand: Able to stand  independently using hands Standing Unsupported: Able to stand 2 minutes with supervision Sitting with Back Unsupported but Feet Supported on Floor or Stool: Able to sit safely and securely 2 minutes Stand to Sit: Controls descent by using hands Transfers: Needs one person to assist (steady A needed) Standing Unsupported with Eyes Closed: Able to stand 10 seconds with supervision Standing Ubsupported with Feet Together: Needs help to attain position but able to stand for 30 seconds with feet together From Standing, Reach Forward with Outstretched Arm: Reaches forward but needs supervision From Standing Position, Pick up Object from Floor: Unable to try/needs assist to keep balance (able to pick up shoe but needs steady A) From Standing Position, Turn to Look Behind Over each Shoulder: Turn sideways only but maintains balance Turn 360 Degrees: Needs assistance while turning (steady A) Standing Unsupported, Alternately Place Feet on Step/Stool: Able to complete >2 steps/needs minimal assist Standing Unsupported, One Foot in Front: Needs help to step but can hold 15 seconds Standing  on One Leg: Unable to try or needs assist to prevent fall (able to keep foot off ground for a few seconds with min A) Total Score: 23  See FIM for current functional status  Therapy/Group: Individual Therapy  Karolee Stamps [facility] 08/16/2012, 2:57 PM

## 2012-08-16 NOTE — Progress Notes (Signed)
Subjective/Complaints: Very alert. In good spirits. Pain better. Slept well. A 12 point review of systems has been performed and if not noted above is otherwise negative.   Objective: Vital Signs: Blood pressure 195/99, pulse 86, temperature 98.6 F (37 C), temperature source Oral, resp. rate 17, height 5\' 3"  (1.6 m), weight 64.229 kg (141 lb 9.6 oz), SpO2 98.00%. Dg Knee 1-2 Views Left  08/14/2012  *RADIOLOGY REPORT*  Clinical Data: Increased right knee pain  LEFT KNEE - 1-2 VIEW  Comparison: None  Findings: There is narrowing of the medial compartment.  There is mild chondrocalcinosis of the medial compartment.  No joint effusion.  Vascular calcifications noted.  IMPRESSION:  1.  Mild osteoarthritis of the medial compartment. 2.  Suggestion of mild chondrocalcinosis.   Original Report Authenticated By: Genevive Bi, M.D.     Recent Labs  08/14/12 0915  WBC 7.7  HGB 8.8*  HCT 25.8*  PLT 267    Recent Labs  08/14/12 0915 08/15/12 0605  NA 139 138  K 3.0* 3.8  CL 107 106  GLUCOSE 182* 217*  BUN 24* 25*  CREATININE 1.95* 2.02*  CALCIUM 8.5 8.6   CBG (last 3)   Recent Labs  08/15/12 2250 08/16/12 0436 08/16/12 0707  GLUCAP 237* 252* 223*    Wt Readings from Last 3 Encounters:  08/15/12 64.229 kg (141 lb 9.6 oz)  08/08/12 66.5 kg (146 lb 9.7 oz)  08/01/12 70.3 kg (154 lb 15.7 oz)    Physical Exam:  HENT: dentition fair Staples intact to craniotomy site. No drainage noted.  Eyes: EOM are normal.  Neck: Neck supple. No thyromegaly present.  Cardiovascular: Regular rhythm. No murmurs, rubs, gallops  Pulmonary/Chest: Effort normal and breath sounds normal. She has no wheezes.  Abdominal: Soft. Bowel sounds are normal. She exhibits no distension.  Neurological: She is  alert  She did follow simple commands. She did need frequent redirection to tasks.She could name familiar objects. Continued impairment of fine motor movements of the left arm, leg. No focal CN  findings, Left upper ext grossly 4/5 proximal to distal. LLE 4/5 prox to 4+ distally. Limited insight and awareness. No gross visual-spatial deficits.  Musc: left minimally tender with ROM.      Assessment/Plan: 1. Functional deficits secondary to acute on chronic right fronto-parietal SDH which require 3+ hours per day of interdisciplinary therapy in a comprehensive inpatient rehab setting. Physiatrist is providing close team supervision and 24 hour management of active medical problems listed below. Physiatrist and rehab team continue to assess barriers to discharge/monitor patient progress toward functional and medical goals.      FIM: FIM - Bathing Bathing Steps Patient Completed: Chest;Right Arm;Left Arm;Abdomen;Front perineal area;Right upper leg;Left upper leg Bathing: 3: Mod-Patient completes 5-7 71f 10 parts or 50-74%  FIM - Upper Body Dressing/Undressing Upper body dressing/undressing steps patient completed: Put head through opening of pull over shirt/dress;Thread/unthread left sleeve of pullover shirt/dress;Thread/unthread right sleeve of pullover shirt/dresss;Pull shirt over trunk Upper body dressing/undressing: 5: Set-up assist to: Obtain clothing/put away FIM - Lower Body Dressing/Undressing Lower body dressing/undressing: 0: Wears gown/pajamas-no public clothing  FIM - Toileting Toileting steps completed by patient: Performs perineal hygiene Toileting Assistive Devices: Grab bar or rail for support Toileting: 2: Max-Patient completed 1 of 3 steps  FIM - Archivist Transfers: 3-To toilet/BSC: Mod A (lift or lower assist);3-From toilet/BSC: Mod A (lift or lower assist)  FIM - Bed/Chair Transfer Bed/Chair Transfer: 4: Chair or W/C > Bed: Min A (  steadying Pt. > 75%);4: Bed > Chair or W/C: Min A (steadying Pt. > 75%)  FIM - Locomotion: Wheelchair Distance: 75 Locomotion: Wheelchair: 2: Travels 50 - 149 ft with minimal assistance (Pt.>75%) FIM - Locomotion:  Ambulation Ambulation/Gait Assistance: 2: Max assist Locomotion: Ambulation: 2: Travels 50 - 149 ft with minimal assistance (Pt.>75%)  Comprehension Comprehension Mode: Auditory Comprehension: 3-Understands basic 50 - 74% of the time/requires cueing 25 - 50%  of the time  Expression Expression Mode: Verbal Expression: 2-Expresses basic 25 - 49% of the time/requires cueing 50 - 75% of the time. Uses single words/gestures.  Social Interaction Social Interaction: 2-Interacts appropriately 25 - 49% of time - Needs frequent redirection.  Problem Solving Problem Solving: 1-Solves basic less than 25% of the time - needs direction nearly all the time or does not effectively solve problems and may need a restraint for safety  Memory Memory: 2-Recognizes or recalls 25 - 49% of the time/requires cueing 51 - 75% of the time  Medical Problem List and Plan:  1. Acute on chronic right subdural hematoma. Status post right frontal parietal craniotomy 07/13/2012 with recurrent chronic subdural hematoma status post right frontal parietal craniectomy implantation of bone flap into right abdominal wall evacuation of chronic subdural hematoma 08/06/2012  2. DVT Prophylaxis/Anticoagulation: SCDs. Monitor for any signs of DVT. Bilateral Doppler studies 07/03/2012 negative  3. Pain Management: Presently with Tylenol only. Monitor with increased mobility  4. Neuropsych: This patient is not capable of making decisions on his/her own behalf.  5. Seizure prophylaxis. Keppra 500 mg twice a day  6. Diabetes mellitus with peripheral neuropathy. Latest hemoglobin A1c of 12.3.Tradjenta 5 mg daily. SSI for spikes while on steroids 7. Hypertension. Avapro 300 mg daily, Norvasc 10 mg daily, clonidine patch 0.3 mg change every 7 days. Monitor the increased mobility. Add low dose metoprolol 8. Hyperlipidemia. Lipitor  9. Pseudogout: prednisone taper. Knee pain improved 10. Hypokalemia: replacing  -recheck tomorrow   LOS  (Days) 3 A FACE TO FACE EVALUATION WAS PERFORMED  SWARTZ,ZACHARY T 08/16/2012 7:45 AM

## 2012-08-16 NOTE — Progress Notes (Signed)
Nursing Note: Pt's cbg =237.wbb

## 2012-08-16 NOTE — Progress Notes (Signed)
Occupational Therapy Session Note  Patient Details  Name: Donyetta Ogletree MRN: [id number] Date of Birth: [birth year]  Today's Date: 08/16/2012 Time: 0930-1030 Time Calculation (min): 60 min  Short Term Goals: Week 1:  OT Short Term Goal 1 (Week 1): STG=LTG; overall supervision  Skilled Therapeutic Interventions/Progress Updates:    1:1 self care retraining shower level with focus on functional ambulation around the room with RW, sit to stand with mod cuing for proper hand placement, standing balance with and without UE support, sequencing and task organization, initiation and termination of task when completed, sustained to selective attention, simple problem solving, being able to identity when need assistance with task and safety awareness. Pt demonstrated preservation with bathing in shower, repeatedly washing chest and stomach, requiring mod cuing to complete tasks without returning to already washed body parts. Pt with no c/o of knee pain. Pt able to dress with increased amount of time from a pile of folded clothing sitting on the bed. Pt required min verbal cuing for initiation of dressing. Pt stood at sink with supervision for grooming tasks.   Therapy Documentation Precautions:  Precautions Precautions: Fall Precaution Comments: NO Bone flap right side of skull, two JP drains, Decreased safety awareness, decreased awareness of deficits Restrictions Weight Bearing Restrictions: No Pain:  no c/o pain  See FIM for current functional status  Therapy/Group: Individual Therapy   [name] [facility] 08/16/2012, 10:13 AM

## 2012-08-16 NOTE — Progress Notes (Signed)
Nursing Note: Pt's cbg =252.wbb

## 2012-08-16 NOTE — Progress Notes (Signed)
Physical Therapy Note  Patient Details  Name: April [name] MRN: [id number] Date of Birth: 05-31-1922 Today's Date: 08/16/2012  Time: 745-827 42 minutes  No c/o pain.  Gait training with RW in controlled environment min A 150'.  Gait training with obstacle negotiation on carpet, multiple attempts > 150' with min A, cues for safety and problem solving obstacles.  Standing to pick up objects with 1 UE support with min A, no LOB.  Standing balance activity with reaching task with 1 UE support min A to close supervision.  Pt able to tolerate standing/walking > 10 minutes without rest.   W/c mobility on carpet and tile surface with min A for steering.  Individual therapy   DONAWERTH,KAREN 08/16/2012, 8:25 AM

## 2012-08-16 NOTE — Progress Notes (Signed)
Speech Language Pathology Daily Session Notes  Patient Details  Name: April [name] MRN: [id number] Date of Birth: 22-Aug-1921  Today's Date: 08/16/2012  Session 1 Time: 0830-0900 Time Calculation: 30 min  Session 2 Time: 7829-5621 Time Calculation (min): 30 min  Short Term Goals: Week 1: SLP Short Term Goal 1 (Week 1): Pt will utilize small bites and a slow rate with Dys. 3 textures with Min A verbal and tactile cues.  SLP Short Term Goal 2 (Week 1): Pt will utilize external aids and contextual cues to orient to place, time and situation with Min A visual and question cues.  SLP Short Term Goal 3 (Week 1): Pt will demonstrate sustained attention to a functional task for 10 minutes with Min A verbal cues for redirection  SLP Short Term Goal 4 (Week 1): Pt will demonstrate functional problem solving for basic and familair tasks with Mod A verbal and question cues.  SLP Short Term Goal 5 (Week 1): Pt will utilize call bell to express wants/needs with Mod A question and viusal cues.  SLP Short Term Goal 6 (Week 1): Pt will utilize speech intelligibility strategies at the phrase level with Mod A verbal cues.   Skilled Therapeutic Interventions:  Session 1: Treatment focus on dysphagia and cognitive goals. SLP facilitated session by providing Min A verbal cues for initiation of self-feeding. Pt utilized small bites and a slow rate with Mod I but required Min A verbal cues to attend to self-feeding task. Pt also required Max A question and contextual cues for orientation to place, time and situation.   Session 2: Patient participated in group, co-treatment session with OT in diners club with focus on dysphagia and cognitive goals and self-feeding. SLP facilitated session by providing Supervision verbal cues for small bites and a slow rate and Min A verbal cues to attend to self-feeding task. Patient exhibited no overt s/s of aspiration.    FIM:  Comprehension Comprehension Mode:  Auditory Comprehension: 4-Understands basic 75 - 89% of the time/requires cueing 10 - 24% of the time Expression Expression: 3-Expresses basic 50 - 74% of the time/requires cueing 25 - 50% of the time. Needs to repeat parts of sentences. Social Interaction Social Interaction: 3-Interacts appropriately 50 - 74% of the time - May be physically or verbally inappropriate. Problem Solving Problem Solving: 2-Solves basic 25 - 49% of the time - needs direction more than half the time to initiate, plan or complete simple activities Memory Memory: 2-Recognizes or recalls 25 - 49% of the time/requires cueing 51 - 75% of the time FIM - Eating Eating Activity: 5: Supervision/cues  Pain Pain Assessment Pain Assessment: No/denies pain  Therapy/Group: Individual Therapy and Group Therapy  PAYNE, COURTNEY 08/16/2012, 5:15 PM  Feliberto Gottron, MA, CCC-SLP [phone]

## 2012-08-16 NOTE — Progress Notes (Signed)
Nursing Note: Pt unable to void.In and out cathed for 200 cc.Pt tolerated well.wbb

## 2012-08-16 NOTE — Progress Notes (Signed)
Occupational Therapy Session Note  Patient Details  Name: April [name] MRN: [id number] Date of Birth: 06/20/1921  Today's Date: 08/16/2012 Time: 1130-1150 Time Calculation (min): 20 min  Skilled Therapeutic Interventions/Progress Updates:    Pt worked on self feeding in therapy group setting.  Able to initiate and feed herself using the RUE. She would incorporate the LUE when given verbal cueing to do so.  Overall supervision level, but does need increased time to complete meal.  Therapy Documentation Precautions:  Precautions Precautions: Fall Precaution Comments: NO Bone flap right side of skull, two JP drains, Decreased safety awareness, decreased awareness of deficits Restrictions Weight Bearing Restrictions: No  Pain: Pain Assessment Pain Assessment: No/denies pain ADL: See FIM for current functional status  Therapy/Group: Group Therapy  MCGUIRE,JAMES OTR/L 08/16/2012, 1:24 PM

## 2012-08-17 ENCOUNTER — Inpatient Hospital Stay (HOSPITAL_COMMUNITY): Payer: Medicare Other

## 2012-08-17 ENCOUNTER — Inpatient Hospital Stay (HOSPITAL_COMMUNITY): Payer: Medicare Other | Admitting: Occupational Therapy

## 2012-08-17 ENCOUNTER — Inpatient Hospital Stay (HOSPITAL_COMMUNITY): Payer: Medicare Other | Admitting: Speech Pathology

## 2012-08-17 DIAGNOSIS — S065XAA Traumatic subdural hemorrhage with loss of consciousness status unknown, initial encounter: Secondary | ICD-10-CM

## 2012-08-17 DIAGNOSIS — E1165 Type 2 diabetes mellitus with hyperglycemia: Secondary | ICD-10-CM

## 2012-08-17 DIAGNOSIS — W19XXXA Unspecified fall, initial encounter: Secondary | ICD-10-CM

## 2012-08-17 DIAGNOSIS — IMO0001 Reserved for inherently not codable concepts without codable children: Secondary | ICD-10-CM

## 2012-08-17 DIAGNOSIS — I10 Essential (primary) hypertension: Secondary | ICD-10-CM

## 2012-08-17 DIAGNOSIS — S065X9A Traumatic subdural hemorrhage with loss of consciousness of unspecified duration, initial encounter: Secondary | ICD-10-CM

## 2012-08-17 LAB — GLUCOSE, CAPILLARY
Glucose-Capillary: 289 mg/dL — ABNORMAL HIGH (ref 70–99)
Glucose-Capillary: 225 mg/dL — ABNORMAL HIGH (ref 70–99)

## 2012-08-17 LAB — BASIC METABOLIC PANEL
BUN: 29 mg/dL — ABNORMAL HIGH (ref 6–23)
Calcium: 8.7 mg/dL (ref 8.4–10.5)
Creatinine, Ser: 2.01 mg/dL — ABNORMAL HIGH (ref 0.50–1.10)
GFR calc Af Amer: 24 mL/min — ABNORMAL LOW (ref >90)
GFR calc non Af Amer: 21 mL/min — ABNORMAL LOW (ref >90)
Glucose, Bld: 127 mg/dL — ABNORMAL HIGH (ref 70–99)
Potassium: 3.7 mEq/L (ref 3.5–5.1)

## 2012-08-17 MED ORDER — ADULT MULTIVITAMIN W/MINERALS CH: 1.0000 | ORAL_TABLET | Freq: Every day | ORAL | Status: DC

## 2012-08-17 MED ORDER — METOPROLOL TARTRATE 12.5 MG HALF TABLET: 12.5000 mg | ORAL_TABLET | Freq: Two times a day (BID) | ORAL | Status: DC

## 2012-08-17 MED ORDER — SITAGLIPTIN PHOSPHATE 50 MG PO TABS: 50.0000 mg | ORAL_TABLET | Freq: Every day | ORAL | Status: DC

## 2012-08-17 MED ORDER — CLONIDINE HCL 0.3 MG/24HR TD PTWK: 1.0000 | MEDICATED_PATCH | TRANSDERMAL | Status: DC

## 2012-08-17 MED ORDER — AMLODIPINE BESYLATE 10 MG PO TABS: 10.0000 mg | ORAL_TABLET | Freq: Every day | ORAL | Status: DC

## 2012-08-17 MED ORDER — LEVETIRACETAM 500 MG PO TABS: 500.0000 mg | ORAL_TABLET | Freq: Two times a day (BID) | ORAL | Status: DC

## 2012-08-17 MED ORDER — IRBESARTAN 300 MG PO TABS: 300.0000 mg | ORAL_TABLET | Freq: Every day | ORAL | Status: DC

## 2012-08-17 MED ORDER — ATORVASTATIN CALCIUM 40 MG PO TABS: 40.0000 mg | ORAL_TABLET | Freq: Every evening | ORAL | Status: DC

## 2012-08-17 NOTE — Progress Notes (Signed)
Occupational Therapy Note  Patient Details  Name: Octavie Westerhold MRN: [id number] Date of Birth: 1922-03-17 Today's Date: 08/17/2012  Time: 1145-1215(cotx with Speech Therapy-total time 4098-1191) Pt denies pain Group Therapy Pt participated in self feeding group with focus on attention to task, sequencing, self feeding, and swallowing strategies.  Pt required verbal cues X 4 to attend to task and to continue eating.  Pt sat with forward posture throughout meal and required verbal cues to sit upright when eating.   Lavone Neri [facility] 08/17/2012, 2:41 PM

## 2012-08-17 NOTE — Progress Notes (Signed)
Physical Therapy Session Note  Patient Details  Name: April [name] MRN: [id number] Date of Birth: 08/21/1921  Today's Date: 08/17/2012 Time: 8657-8469 Time Calculation (min): 42 min  Skilled Therapeutic Interventions/Progress Updates:    Treatment focused on grad day activities including stair negotiation up/down 5 steps with close S using rails and step to gait pattern; gait for endurance and strengthening on unit with RW with S and in ADL apartment for transfers to/from regular bed and furniture as well as functional gait and balance activity in the kitchen to get cookies off cookie sheet onto plate with overall S. W/c propulsion on unit with extra time and cues for encouragement and obstacle negotiation/attention to task. Left up at nurses's station with safety belt on.  Therapy Documentation Precautions:  Precautions Precautions: Fall Precaution Comments: no bone flap on right side, flap in abdomen Restrictions Weight Bearing Restrictions: No  See FIM for current functional status  Therapy/Group: Individual Therapy  Karolee Stamps [facility] 08/17/2012, 3:36 PM

## 2012-08-17 NOTE — Progress Notes (Signed)
Social Work  Discharge Note  The overall goal for the admission was met for:   Discharge location: Yes - Home with son and grandaughters  Length of Stay: Yes - 4 days  Discharge activity level: Yes - supervision  Home/community participation: Yes  Services provided included: MD, RD, PT, OT, SLP, RN, TR, Pharmacy and SW  Financial Services: Medicare and Private Insurance: [facility] and [employer]BS  Follow-up services arranged: Home Health: PT, OT via [facility], DME: tub seat via [facility] and Patient/Family has no preference for HH/DME agencies  Comments (or additional information):  Patient/Family verbalized understanding of follow-up arrangements: Yes  Individual responsible for coordination of the follow-up plan: son  Confirmed correct DME delivered: Amada Jupiter 08/17/2012    HOYLE, [name]

## 2012-08-17 NOTE — Discharge Summary (Signed)
April [name], April [name] NO.:  [id number]  MEDICAL RECORD NO.:  [id number]  LOCATION:  4004                         FACILITY:  [facility]  PHYSICIAN:  April [name], M.D.DATE OF BIRTH:  04/29/1922  DATE OF ADMISSION:  08/13/2012 DATE OF DISCHARGE:  08/18/2012                              DISCHARGE SUMMARY   DISCHARGE DIAGNOSES: 1. Acute-on-chronic right subdural hematoma, status post right     frontoparietal craniotomy July 13, 2012, with recurrent chronic     subdural hematoma status post right frontal parietal craniectomy     implantation of bone flap into right abdominal wall, evacuation of     chronic subdural hematoma August 06, 2012. 2. Sequential compression devices for DVT prophylaxis, seizure     prophylaxis. 3. Diabetes mellitus with peripheral neuropathy. 4. Hypertension. 5. Hyperlipidemia.  HISTORY OF PRESENT ILLNESS:  This 79 year old right-handed female, history of diabetes mellitus, chronic renal insufficiency with baseline creatinine 1.8-2.10 as well as recent DVT with IVC filter placed. Admitted July 23, 2012, with noted history of large right hemispheric chronic subdural hematoma.  She had been seen by Neurosurgery in the past and surgery is not felt to be indicated.  She presented with an increased left-sided weakness over the past 2 days and followup cranial CT scan showed chronic subdural hematoma with significant mass effect and midline shift.  The patient underwent right frontal parietal craniotomy evacuation of subdural hematoma on July 23, 2012, per Dr. Newell [name], placed on Keppra for seizure prophylaxis. Followup cranial CT scan showed some increase in size of right-sided subdural collection as well as slight increase in size of blood collection.  Dr. Newell [name] reviewed this film and felt the patient neurologically stable and advised repeat scan in 1 week,  She was maintained on a mechanical soft diet.  Physical and occupational  therapy ongoing.  She was admitted for a comprehensive rehab program.  Followup cranial CT scan ultimately completed showing an increased accumulation of mixed density fluid in the subdural space maximal thickness 14-21 mm. She was discharged back to acute care services, underwent a right frontal parietal craniectomy, implantation of bone flap to the right abdominal wall, evacuation of chronic subdural hematoma August 06, 2012. She remained on Keppra for seizure prophylaxis.  She was readmitted for a comprehensive rehab program.  PAST MEDICAL HISTORY:  See discharge diagnoses.  SOCIAL HISTORY:  Lives with her son.  FUNCTIONAL HISTORY PRIOR TO HOSPITAL ADMISSION:  Independent, not driving, retired.  FUNCTIONAL STATUS UPON ADMISSION TO REHAB SERVICES:  Minimal assist to ambulate 150 feet with a rolling walker.  PHYSICAL EXAMINATION:  VITAL SIGNS:  Blood pressure 186/81, pulse 88, temperature 98.3, respirations 20. GENERAL:  This was an alert female.  She could name person and place, followed simple commands.  She did need some frequent redirection that time for tasks.  She did name familiar objects.  Decreased coordination and fine motor movements on the left arm and leg. HEAD:  Craniotomy site was healing nicely. LUNGS:  Clear to auscultation. CARDIAC:  Rate controlled. ABDOMEN:  Soft, nontender.  Good bowel sounds.  REHABILITATION HOSPITAL COURSE:  The patient was admitted to Inpatient Rehab Services with therapies initiated on a  3-hour daily basis consisting of physical therapy, occupational therapy, speech therapy, and rehabilitation nursing.  The following issues were addressed during the patient's rehabilitation stay.  Pertaining to April [name]'s acute on chronic subdural hematoma she had undergone craniectomy, evacuation of hematoma with bone flap into right abdominal wall August 06, 2012, per Dr. Newell [name].  Surgical site was healing nicely.  Followup cranial CT scan prior to  discharge August 17, 2012 showed interval decrease in size of right-sided subdural collections as detailed.  She remained on Keppra for seizure prophylaxis and no seizure activity.  She did have a history of diabetes mellitus.  She remained on Tradjenta, hemoglobin A1c 12.3. She had been on Lantus insulin prior to hospital admission.  Her blood sugars were monitored closely due to the fact she had been placed on a recent dose of prednisone due to some pseudogout to her knee where x- rays were unremarkable.  Her blood pressures remained well controlled on Avapro, Norvasc, and clonidine patch, with the addition of low-dose beta- blocker 12.5 mg b.i.d.  The patient received weekly collaborative interdisciplinary team conferences to discuss estimated length of stay, family teaching, and any barriers to her discharge.  She was progressing nicely in all areas of her therapies.  She was able to stand independently using her hands.  She was using an assistive device for her ambulation.  Her son will provide the necessary assistance at home. Home health therapies would be arranged.  DISCHARGE MEDICATIONS:  At the time of dictation included: 1. Norvasc 10 mg p.o. daily. 2. Clonidine patch 0.3 mg change every 7 days. 3. Avapro 300 mg daily. 4. Keppra 500 mg b.i.d. 5. Tradjenta 5 mg p.o. daily. 6. Lopressor 12.5 mg b.i.d. 7. Multivitamin daily. 8. Prednisone taper as advised.  DIET:  Mechanical soft ADA restrictions.  The patient would follow up with Dr. Faith [name] at the Outpatient Rehab Service office appointment to be made.  Dr. Newell [name] call for appointment within 2 weeks.  Home health therapies had been arranged.     April [name], P.A.   ______________________________ April [name], M.D.    DA/MEDQ  D:  08/17/2012  T:  08/17/2012  Job:  [id number]  cc:   April [name], M.D.

## 2012-08-17 NOTE — Discharge Summary (Signed)
  Discharge summary job # [id number]

## 2012-08-17 NOTE — Progress Notes (Signed)
Occupational Therapy Discharge Summary  Patient Details  Name: April [name] MRN: [id number] Date of Birth: [birth year]  Today's Date: 08/17/2012 Time: 0915-1000 Time Calculation (min): 45 min  1:1 GRAD KGM:WNUU care retraining with focus on sit to stands, standing balance, safe use of RW with functional mobility/ transfers and ambulation, simple problem solving, initiating, orientation. Pt only oriented to self and place; but continues to be inconsistent throughout the day.   2nd session no c/o pain 1330:1415  1:1 simple meal prep task in the kitchen of making cookies. Focus on functional mobility with RW, safety with RW, standing tolerance and balance, simple problem solving, following 1 step direction, intellectual awareness.  Overall pt needed max A to complete task; however was able to perform all kitchen tasks in standing without a rest break.  Patient has met 9 of 10 long term goals due to improved activity tolerance, improved balance, postural control, ability to compensate for deficits, functional use of  RIGHT upper, RIGHT lower, LEFT upper and LEFT lower extremity, improved attention and improved alertness.  Patient to discharge at overall Supervision level.  Patient's care partner is independent to provide the necessary physical and cognitive assistance at discharge.    Reasons goals not met: Pt didn't meet goal of demonstrating emergent awareness with min A; pt still requires A for intellectual awareness. And Is not consistently oriented.   Recommendation:  Patient will benefit from ongoing skilled OT services in home health setting to continue to advance functional skills in the area of BADL and Reduce care partner burden.  Equipment: shower chair with back  Reasons for discharge: treatment goals met and discharge from hospital  Patient/family agrees with progress made and goals achieved: Yes  OT Discharge Precautions/Restrictions  Precautions Precautions: Fall Precaution  Comments: no bone flap on right side, flap in abdomen Restrictions Weight Bearing Restrictions: No Pain Pain Assessment Pain Assessment: No/denies pain ADL See fim Vision/Perception  Vision - History Patient Visual Report: No change from baseline Vision - Assessment Eye Alignment: Within Functional Limits Perception Perception: Within Functional Limits Praxis Praxis: Intact  Cognition Overall Cognitive Status: Impaired Arousal/Alertness: Awake/alert Orientation Level: Oriented to person;Disoriented to situation;Disoriented to time;Disoriented to place Attention: Sustained;Selective Sustained Attention: Appears intact Selective Attention: Impaired Selective Attention Impairment: Verbal basic;Functional basic Memory: Impaired Memory Impairment: Storage deficit;Retrieval deficit;Decreased recall of new information;Decreased short term memory Decreased Short Term Memory: Verbal basic;Functional basic Awareness: Impaired Awareness Impairment: Intellectual impairment Problem Solving: Impaired Problem Solving Impairment: Verbal basic;Functional basic Executive Function: Decision Making;Initiating;Self Correcting;Self Monitoring Decision Making: Impaired Decision Making Impairment: Verbal basic;Functional basic Initiating: Impaired Initiating Impairment: Verbal basic;Functional basic Self Monitoring: Impaired Self Monitoring Impairment: Verbal basic;Functional basic Self Correcting: Impaired Self Correcting Impairment: Verbal basic;Functional basic Safety/Judgment: Impaired Sensation Sensation Light Touch: Appears Intact Proprioception: Appears Intact Coordination Gross Motor Movements are Fluid and Coordinated: Yes Fine Motor Movements are Fluid and Coordinated: Yes Motor  Motor Motor - Discharge Observations: improved overall strength Mobility  Bed Mobility Supine to Sit: 5: Supervision Sitting - Scoot to Edge of Bed: 5: Supervision Sit to Supine: 5:  Supervision Transfers Sit to Stand: 5: Supervision Stand to Sit: 5: Supervision  Trunk/Postural Assessment  Cervical Assessment Cervical Assessment: Within Functional Limits Thoracic Assessment Thoracic Assessment: Within Functional Limits Lumbar Assessment Lumbar Assessment: Within Functional Limits Postural Control Righting Reactions: delayed Protective Responses: delayed  Balance Static Sitting Balance Static Sitting - Level of Assistance: 6: Modified independent (Device/Increase time) Dynamic Sitting Balance Dynamic Sitting - Level of Assistance: 5: Stand by  assistance Static Standing Balance Static Standing - Level of Assistance: 5: Stand by assistance Dynamic Standing Balance Dynamic Standing - Level of Assistance: 5: Stand by assistance Extremity/Trunk Assessment RUE Assessment RUE Assessment: Within Functional Limits LUE Assessment LUE Assessment: Within Functional Limits  See FIM for current functional status  [name] [facility] 08/17/2012, 12:07 PM

## 2012-08-17 NOTE — Progress Notes (Signed)
Subjective/Complaints: No complaints. Making nice gains with therapies A 12 point review of systems has been performed and if not noted above is otherwise negative.   Objective: Vital Signs: Blood pressure 152/64, pulse 84, temperature 98.5 F (36.9 C), temperature source Oral, resp. rate 19, height 5\' 3"  (1.6 m), weight 64.229 kg (141 lb 9.6 oz), SpO2 97.00%. Ct Head Wo Contrast  08/17/2012  *RADIOLOGY REPORT*  Clinical Data: Followup subdural hematoma.  CT HEAD WITHOUT CONTRAST  Technique:  Contiguous axial images were obtained from the base of the skull through the vertex without contrast.  Comparison: 08/13/2012.  Findings: Post right frontal craniotomy.  Interval clearing of pneumocephalus and almost complete clearing of gas in the craniotomy site.  Decrease in size of residual right hemispheric hyperdense subdural hematoma now with maximal thickness of 3.6 mm versus prior 9.2 mm. Right hemispheric hypodense subdural collection (most notable anterior right frontal region) has decreased in size now 6.7 mm versus prior 8 mm.  Small vessel disease type changes without CT evidence of large acute infarct.  Global atrophy without hydrocephalus.  Vascular calcifications.  No intracranial mass lesion detected on this unenhanced exam.  IMPRESSION: Interval decrease in size of right-sided subdural collections as detailed above.   Original Report Authenticated By: Lacy Duverney, M.D.     Recent Labs  08/14/12 0915  WBC 7.7  HGB 8.8*  HCT 25.8*  PLT 267    Recent Labs  08/14/12 0915 08/15/12 0605  NA 139 138  K 3.0* 3.8  CL 107 106  GLUCOSE 182* 217*  BUN 24* 25*  CREATININE 1.95* 2.02*  CALCIUM 8.5 8.6   CBG (last 3)   Recent Labs  08/16/12 1635 08/16/12 2128 08/17/12 0703  GLUCAP 256* 158* 113*    Wt Readings from Last 3 Encounters:  08/15/12 64.229 kg (141 lb 9.6 oz)  08/08/12 66.5 kg (146 lb 9.7 oz)  08/01/12 70.3 kg (154 lb 15.7 oz)    Physical Exam:  HENT: dentition  fair Staples intact to craniotomy site. No drainage noted.  Eyes: EOM are normal.  Neck: Neck supple. No thyromegaly present.  Cardiovascular: Regular rhythm. No murmurs, rubs, gallops  Pulmonary/Chest: Effort normal and breath sounds normal. She has no wheezes.  Abdominal: Soft. Bowel sounds are normal. She exhibits no distension.  Neurological: She is  alert  Continued cognitive improvement. Fair insight and awareness. Conversationally appropriate for basic concepts. Left upper ext grossly 4/5 proximal to distal. LLE 4/5 prox to 4+ distally.  No gross visual-spatial deficits.  Musc: left minimally tender with ROM.      Assessment/Plan: 1. Functional deficits secondary to acute on chronic right fronto-parietal SDH which require 3+ hours per day of interdisciplinary therapy in a comprehensive inpatient rehab setting. Physiatrist is providing close team supervision and 24 hour management of active medical problems listed below. Physiatrist and rehab team continue to assess barriers to discharge/monitor patient progress toward functional and medical goals.  Follow up head CT today reveals further improvement   FIM: FIM - Bathing Bathing Steps Patient Completed: Chest;Right Arm;Left Arm;Abdomen;Front perineal area;Right upper leg;Left upper leg Bathing: 3: Mod-Patient completes 5-7 73f 10 parts or 50-74%  FIM - Upper Body Dressing/Undressing Upper body dressing/undressing steps patient completed: Put head through opening of pull over shirt/dress;Thread/unthread left sleeve of pullover shirt/dress;Thread/unthread right sleeve of pullover shirt/dresss;Pull shirt over trunk Upper body dressing/undressing: 5: Set-up assist to: Obtain clothing/put away FIM - Lower Body Dressing/Undressing Lower body dressing/undressing: 0: Wears gown/pajamas-no public clothing  FIM -  Toileting Toileting steps completed by patient: Performs perineal hygiene Toileting Assistive Devices: Grab bar or rail for  support Toileting: 2: Max-Patient completed 1 of 3 steps  FIM - Archivist Transfers: 3-To toilet/BSC: Mod A (lift or lower assist);3-From toilet/BSC: Mod A (lift or lower assist)  FIM - Bed/Chair Transfer Bed/Chair Transfer: 4: Bed > Chair or W/C: Min A (steadying Pt. > 75%);4: Chair or W/C > Bed: Min A (steadying Pt. > 75%)  FIM - Locomotion: Wheelchair Distance: 75 Locomotion: Wheelchair: 2: Travels 50 - 149 ft with minimal assistance (Pt.>75%) FIM - Locomotion: Ambulation Ambulation/Gait Assistance: 2: Max assist Locomotion: Ambulation: 4: Travels 150 ft or more with minimal assistance (Pt.>75%)  Comprehension Comprehension Mode: Auditory Comprehension: 4-Understands basic 75 - 89% of the time/requires cueing 10 - 24% of the time  Expression Expression Mode: Verbal Expression: 3-Expresses basic 50 - 74% of the time/requires cueing 25 - 50% of the time. Needs to repeat parts of sentences.  Social Interaction Social Interaction: 3-Interacts appropriately 50 - 74% of the time - May be physically or verbally inappropriate.  Problem Solving Problem Solving: 2-Solves basic 25 - 49% of the time - needs direction more than half the time to initiate, plan or complete simple activities  Memory Memory: 2-Recognizes or recalls 25 - 49% of the time/requires cueing 51 - 75% of the time  Medical Problem List and Plan:  1. Acute on chronic right subdural hematoma. Status post right frontal parietal craniotomy 07/13/2012 with recurrent chronic subdural hematoma status post right frontal parietal craniectomy implantation of bone flap into right abdominal wall evacuation of chronic subdural hematoma 08/06/2012  2. DVT Prophylaxis/Anticoagulation: SCDs. Monitor for any signs of DVT. Bilateral Doppler studies 07/03/2012 negative  3. Pain Management: Presently with Tylenol only. Monitor with increased mobility  4. Neuropsych: This patient is not capable of making decisions on his/her  own behalf.  5. Seizure prophylaxis. Keppra 500 mg twice a day  6. Diabetes mellitus with peripheral neuropathy. Latest hemoglobin A1c of 12.3.Tradjenta 5 mg daily. SSI for spikes while on steroids 7. Hypertension. Avapro 300 mg daily, Norvasc 10 mg daily, clonidine patch 0.3 mg change every 7 days. Monitor the increased mobility. Added low dose metoprolol 8. Hyperlipidemia. Lipitor  9. Pseudogout: prednisone taper. Knee pain improved 10. Hypokalemia: replacing  -recheck today.   LOS (Days) 4 A FACE TO FACE EVALUATION WAS PERFORMED  SWARTZ,ZACHARY T 08/17/2012 7:52 AM

## 2012-08-17 NOTE — Progress Notes (Signed)
Speech Language Pathology Session Notes & Discharge Summary  Patient Details  Name: April [name] MRN: [id number] Date of Birth: December 20, 1921  Today's Date: 08/17/2012  Session 1 Time: 1030-1100 Time Calculation: 30 min  Session 2 Time: 1130-1145 Time Calculation (min): 15 min  Skilled Therapeutic Intervention:  Session 1: Treatment focus on cognitive goals. Pt was lathargic throughout the session and required Max A verbal and tactile cues to sustain attention for ~60 seconds. Pt also required extended wait time and multiple repititions to answer mildly complex yes/no questions and to follow 1 step commands.   Session 2: Patient participated in group, co-treatment session with OT in [employer] with focus on dysphagia and cognitive goals and self-feeding. SLP facilitated session by providing Mod verbal cues for small bites and a slow rate and Mod A verbal cues to attend to self-feeding task. Patient exhibited no overt s/s of aspiration.   Patient has met 2 of 7 long term goals.  Patient to discharge at overall Max level.   Reasons goals not met: Pt with a shorter length of stay and pt's overall cognitive function impacted by her fatigue and lethargy.    Clinical Impression/Discharge Summary: Currently, pt is consuming Dys. 3 textures with thin liquids without overt s/s of aspiration and requires Min A verbal cues for utilization of swallowing compensatory strategies of small bites and a slow rate. Pt has demonstrated increased speech intelligibility at the phrase level due to increased arousal and vocal intensity. Pt requires Mod-Max A multimodal cueing for intellectual awareness of deficits, initiation, functional problem solving, sustained attention, and working memory. Pt/family education complete and pt would benefit from follow-up home health skilled SLP intervention to maximize cognitive recovery and overall functional independence.    Care Partner:  Caregiver Able to Provide Assistance:  Yes  Type of Caregiver Assistance: Physical;Cognitive  Recommendation:  Home Health SLP;24 hour supervision/assistance  Rationale for SLP Follow Up: Maximize cognitive function and independence;Reduce caregiver burden;Maximize swallowing safety;Maximize functional communication   Equipment: None   Reasons for discharge: Discharged from hospital   Patient/Family Agrees with Progress Made and Goals Achieved: Yes   See FIM for current functional status  PAYNE, COURTNEY 08/17/2012, 4:07 PM

## 2012-08-17 NOTE — Progress Notes (Signed)
Physical Therapy Discharge Summary  Patient Details  Name: April [name] MRN: [id number] Date of Birth: [birth year]  Today's Date: 08/17/2012  Patient has met 9 of 9 long term goals due to improved activity tolerance, improved balance, improved postural control and decreased pain.  Patient to discharge at an ambulatory level Supervision with RW.     Reasons goals not met: n/a  Recommendation:  Patient will benefit from ongoing skilled PT services in home health setting to continue to advance safe functional mobility, address ongoing impairments in balance, cognition, gait, and minimize fall risk.  Equipment: No equipment provided  Reasons for discharge: treatment goals met and discharge from hospital  Patient/family agrees with progress made and goals achieved: Yes   See FIM for current functional status  Karolee Stamps [facility] 08/17/2012, 2:59 PM

## 2012-08-18 DIAGNOSIS — D649 Anemia, unspecified: Secondary | ICD-10-CM

## 2012-08-18 DIAGNOSIS — J9600 Acute respiratory failure, unspecified whether with hypoxia or hypercapnia: Secondary | ICD-10-CM

## 2012-08-18 DIAGNOSIS — I10 Essential (primary) hypertension: Secondary | ICD-10-CM

## 2012-08-18 DIAGNOSIS — E119 Type 2 diabetes mellitus without complications: Secondary | ICD-10-CM

## 2012-08-18 DIAGNOSIS — I82409 Acute embolism and thrombosis of unspecified deep veins of unspecified lower extremity: Secondary | ICD-10-CM

## 2012-08-18 DIAGNOSIS — I6200 Nontraumatic subdural hemorrhage, unspecified: Secondary | ICD-10-CM

## 2012-08-18 DIAGNOSIS — N179 Acute kidney failure, unspecified: Secondary | ICD-10-CM

## 2012-08-18 DIAGNOSIS — R609 Edema, unspecified: Secondary | ICD-10-CM

## 2012-08-18 LAB — GLUCOSE, CAPILLARY: Glucose-Capillary: 149 mg/dL — ABNORMAL HIGH (ref 70–99)

## 2012-08-18 NOTE — Progress Notes (Signed)
Patient ID: April [name], female   DOB: 02/15/1922, 79 y.o.   MRN: [id number] Subjective/Complaints:  16/15.  79 year old patient who was admitted for subdural hemorrhage. Hospital course complicated by pneumonia encephalopathy acute renal failure. Patient has a history of chronic kidney disease. Other medical issues include hypertension and diabetes.  Patient stable for discharge today No complaints. Making nice gains with therapies A 12 point review of systems has been performed and if not noted above is otherwise negative.  BP Readings from Last 3 Encounters:  08/18/12 186/75  08/13/12 180/67  08/06/12 193/80   Impression. Status post subdural hematoma Diabetes mellitus Hypertension  Stable for discharge. Outpatient titration of blood  pressure and diabetic meds  CBG (last 3)   Recent Labs  08/17/12 1643 08/17/12 2038 08/18/12 0735  GLUCAP 289* 225* 149*    Objective: Vital Signs: Blood pressure 186/75, pulse 85, temperature 98.2 F (36.8 C), temperature source Oral, resp. rate 18, height 5\' 3"  (1.6 m), weight 64.229 kg (141 lb 9.6 oz), SpO2 97.00%. Ct Head Wo Contrast  08/17/2012  *RADIOLOGY REPORT*  Clinical Data: Followup subdural hematoma.  CT HEAD WITHOUT CONTRAST  Technique:  Contiguous axial images were obtained from the base of the skull through the vertex without contrast.  Comparison: 08/13/2012.  Findings: Post right frontal craniotomy.  Interval clearing of pneumocephalus and almost complete clearing of gas in the craniotomy site.  Decrease in size of residual right hemispheric hyperdense subdural hematoma now with maximal thickness of 3.6 mm versus prior 9.2 mm. Right hemispheric hypodense subdural collection (most notable anterior right frontal region) has decreased in size now 6.7 mm versus prior 8 mm.  Small vessel disease type changes without CT evidence of large acute infarct.  Global atrophy without hydrocephalus.  Vascular calcifications.  No intracranial mass  lesion detected on this unenhanced exam.  IMPRESSION: Interval decrease in size of right-sided subdural collections as detailed above.   Original Report Authenticated By: Lacy Duverney, M.D.    No results found for this basename: WBC, HGB, HCT, PLT,  in the last 72 hours  Recent Labs  08/17/12 0634  NA 140  K 3.7  CL 108  GLUCOSE 127*  BUN 29*  CREATININE 2.01*  CALCIUM 8.7   CBG (last 3)   Recent Labs  08/17/12 1113 08/17/12 1643 08/17/12 2038  GLUCAP 181* 289* 225*    Wt Readings from Last 3 Encounters:  08/15/12 64.229 kg (141 lb 9.6 oz)  08/08/12 66.5 kg (146 lb 9.7 oz)  08/01/12 70.3 kg (154 lb 15.7 oz)    Physical Exam:  HENT: dentition fair Staples intact to craniotomy site. No drainage noted.  Eyes: EOM are normal.  Neck: Neck supple. No thyromegaly present.  Cardiovascular: Regular rhythm. No murmurs, rubs, gallops  Pulmonary/Chest: Effort normal and breath sounds normal. She has no wheezes.  Abdominal: Soft. Bowel sounds are normal. She exhibits no distension.  Neurological: She is  alert  Continued cognitive improvement. Fair insight and awareness. Conversationally appropriate for basic concepts. Left upper ext grossly 4/5 proximal to distal. LLE 4/5 prox to 4+ distally.  No gross visual-spatial deficits.  Musc: left minimally tender with ROM.      Assessment/Plan: 1. Functional deficits secondary to acute on chronic right fronto-parietal SDH which require 3+ hours per day of interdisciplinary therapy in a comprehensive inpatient rehab setting. Physiatrist is providing close team supervision and 24 hour management of active medical problems listed below. Physiatrist and rehab team continue to assess barriers to discharge/monitor  patient progress toward functional and medical goals.  Follow up head CT today reveals further improvement   FIM: FIM - Bathing Bathing Steps Patient Completed: Chest;Right lower leg (including foot);Left lower leg (including  foot);Right Arm;Left Arm;Abdomen;Front perineal area;Buttocks;Right upper leg;Left upper leg Bathing: 5: Set-up assist to: Obtain items  FIM - Upper Body Dressing/Undressing Upper body dressing/undressing steps patient completed: Thread/unthread left sleeve of pullover shirt/dress;Thread/unthread right sleeve of pullover shirt/dresss;Pull shirt over trunk;Put head through opening of pull over shirt/dress Upper body dressing/undressing: 5: Set-up assist to: Obtain clothing/put away FIM - Lower Body Dressing/Undressing Lower body dressing/undressing steps patient completed: Thread/unthread right underwear leg;Thread/unthread left underwear leg;Pull underwear up/down;Thread/unthread right pants leg;Thread/unthread left pants leg;Pull pants up/down;Don/Doff right sock;Don/Doff left sock;Don/Doff right shoe;Don/Doff left shoe;Fasten/unfasten right shoe;Fasten/unfasten left shoe Lower body dressing/undressing: 5: Supervision: Safety issues/verbal cues  FIM - Toileting Toileting steps completed by patient: Adjust clothing prior to toileting;Performs perineal hygiene;Adjust clothing after toileting Toileting Assistive Devices: Grab bar or rail for support Toileting: 5: Supervision: Safety issues/verbal cues  FIM - Archivist Transfers: 5-To toilet/BSC: Supervision (verbal cues/safety issues);5-From toilet/BSC: Supervision (verbal cues/safety issues)  FIM - Banker Devices: Therapist, occupational: 5: Supine > Sit: Supervision (verbal cues/safety issues);5: Bed > Chair or W/C: Supervision (verbal cues/safety issues);5: Chair or W/C > Bed: Supervision (verbal cues/safety issues)  FIM - Locomotion: Wheelchair Distance: 75 Locomotion: Wheelchair: 5: Travels 150 ft or more: maneuvers on rugs and over door sills with supervision, cueing or coaxing FIM - Locomotion: Ambulation Locomotion: Ambulation Assistive Devices: Designer, industrial/product Ambulation/Gait  Assistance: 5: Supervision Locomotion: Ambulation: 5: Travels 150 ft or more with supervision/safety issues  Comprehension Comprehension Mode: Auditory Comprehension: 4-Understands basic 75 - 89% of the time/requires cueing 10 - 24% of the time  Expression Expression Mode: Verbal Expression: 3-Expresses basic 50 - 74% of the time/requires cueing 25 - 50% of the time. Needs to repeat parts of sentences.  Social Interaction Social Interaction: 3-Interacts appropriately 50 - 74% of the time - May be physically or verbally inappropriate.  Problem Solving Problem Solving: 2-Solves basic 25 - 49% of the time - needs direction more than half the time to initiate, plan or complete simple activities  Memory Memory: 2-Recognizes or recalls 25 - 49% of the time/requires cueing 51 - 75% of the time  Medical Problem List and Plan:  1. Acute on chronic right subdural hematoma. Status post right frontal parietal craniotomy 07/13/2012 with recurrent chronic subdural hematoma status post right frontal parietal craniectomy implantation of bone flap into right abdominal wall evacuation of chronic subdural hematoma 08/06/2012  2. DVT Prophylaxis/Anticoagulation: SCDs. Monitor for any signs of DVT. Bilateral Doppler studies 07/03/2012 negative  3. Pain Management: Presently with Tylenol only. Monitor with increased mobility  4. Neuropsych: This patient is not capable of making decisions on his/her own behalf.  5. Seizure prophylaxis. Keppra 500 mg twice a day  6. Diabetes mellitus with peripheral neuropathy. Latest hemoglobin A1c of 12.3.Tradjenta 5 mg daily. SSI for spikes while on steroids 7. Hypertension. Avapro 300 mg daily, Norvasc 10 mg daily, clonidine patch 0.3 mg change every 7 days. Monitor the increased mobility. Added low dose metoprolol 8. Hyperlipidemia. Lipitor  9. Pseudogout: prednisone taper. Knee pain improved 10. Hypokalemia: replacing  -recheck today.   LOS (Days) 5 A FACE TO FACE  EVALUATION WAS PERFORMED  Rogelia Boga 08/18/2012 8:26 AM

## 2012-08-27 ENCOUNTER — Other Ambulatory Visit: Payer: Self-pay | Admitting: Neurosurgery

## 2012-08-27 DIAGNOSIS — I621 Nontraumatic extradural hemorrhage: Secondary | ICD-10-CM

## 2012-08-27 DIAGNOSIS — I6200 Nontraumatic subdural hemorrhage, unspecified: Secondary | ICD-10-CM

## 2012-09-02 ENCOUNTER — Emergency Department (HOSPITAL_COMMUNITY)
Admission: EM | Admit: 2012-09-02 | Discharge: 2012-09-02 | Discharge status: Against Medical Advice | Payer: Medicare Other | Attending: Emergency Medicine | Admitting: Emergency Medicine

## 2012-09-02 ENCOUNTER — Encounter (HOSPITAL_COMMUNITY): Payer: Self-pay | Admitting: Nurse Practitioner

## 2012-09-02 DIAGNOSIS — I10 Essential (primary) hypertension: Secondary | ICD-10-CM | POA: Insufficient documentation

## 2012-09-02 DIAGNOSIS — R109 Unspecified abdominal pain: Secondary | ICD-10-CM | POA: Insufficient documentation

## 2012-09-02 DIAGNOSIS — E119 Type 2 diabetes mellitus without complications: Secondary | ICD-10-CM | POA: Insufficient documentation

## 2012-09-02 NOTE — ED Notes (Signed)
Pt had craniotomy by dr Newell Coral 2 weeks ago, part of her skull was placed into her abdomen to save it for replacement back into head at end of this month. Pt son states last night he noticed where the bone is in the pt abd looked different and she was having stomach pain. Pt denies any pain now.

## 2012-09-03 ENCOUNTER — Encounter: Payer: Medicare Other | Admitting: Physical Medicine & Rehabilitation

## 2012-09-04 ENCOUNTER — Ambulatory Visit
Admission: RE | Admit: 2012-09-04 | Discharge: 2012-09-04 | Disposition: A | Discharge status: Home / Self Care | Payer: Medicare Other | Source: Ambulatory Visit | Attending: Neurosurgery | Admitting: Neurosurgery

## 2012-09-04 DIAGNOSIS — I621 Nontraumatic extradural hemorrhage: Secondary | ICD-10-CM

## 2012-09-04 DIAGNOSIS — I6200 Nontraumatic subdural hemorrhage, unspecified: Secondary | ICD-10-CM

## 2012-10-11 ENCOUNTER — Other Ambulatory Visit: Payer: Self-pay | Admitting: Radiology

## 2012-11-08 ENCOUNTER — Emergency Department (INDEPENDENT_AMBULATORY_CARE_PROVIDER_SITE_OTHER)
Admission: EM | Admit: 2012-11-08 | Discharge: 2012-11-08 | Disposition: A | Discharge status: Home / Self Care | Payer: Medicare Other | Source: Home / Self Care | Attending: Emergency Medicine | Admitting: Emergency Medicine

## 2012-11-08 ENCOUNTER — Encounter (HOSPITAL_COMMUNITY): Payer: Self-pay | Admitting: *Deleted

## 2012-11-08 ENCOUNTER — Other Ambulatory Visit: Payer: Self-pay | Admitting: Neurosurgery

## 2012-11-08 DIAGNOSIS — J9600 Acute respiratory failure, unspecified whether with hypoxia or hypercapnia: Secondary | ICD-10-CM

## 2012-11-08 DIAGNOSIS — E119 Type 2 diabetes mellitus without complications: Secondary | ICD-10-CM

## 2012-11-08 DIAGNOSIS — N179 Acute kidney failure, unspecified: Secondary | ICD-10-CM

## 2012-11-08 DIAGNOSIS — I6200 Nontraumatic subdural hemorrhage, unspecified: Secondary | ICD-10-CM

## 2012-11-08 DIAGNOSIS — I10 Essential (primary) hypertension: Secondary | ICD-10-CM

## 2012-11-08 DIAGNOSIS — D649 Anemia, unspecified: Secondary | ICD-10-CM

## 2012-11-08 DIAGNOSIS — I82409 Acute embolism and thrombosis of unspecified deep veins of unspecified lower extremity: Secondary | ICD-10-CM

## 2012-11-08 DIAGNOSIS — I621 Nontraumatic extradural hemorrhage: Secondary | ICD-10-CM

## 2012-11-08 DIAGNOSIS — E7800 Pure hypercholesterolemia, unspecified: Secondary | ICD-10-CM

## 2012-11-08 DIAGNOSIS — R609 Edema, unspecified: Secondary | ICD-10-CM

## 2012-11-08 MED ORDER — SITAGLIPTIN PHOSPHATE 50 MG PO TABS: 50.0000 mg | ORAL_TABLET | Freq: Every day | ORAL | Status: DC

## 2012-11-08 MED ORDER — CLONIDINE HCL 0.3 MG/24HR TD PTWK: 1.0000 | MEDICATED_PATCH | TRANSDERMAL | Status: DC

## 2012-11-08 MED ORDER — IRBESARTAN 300 MG PO TABS: 300.0000 mg | ORAL_TABLET | Freq: Every day | ORAL | Status: DC

## 2012-11-08 MED ORDER — METOPROLOL TARTRATE 12.5 MG HALF TABLET: 12.5000 mg | ORAL_TABLET | Freq: Two times a day (BID) | ORAL | Status: DC

## 2012-11-08 MED ORDER — ATORVASTATIN CALCIUM 40 MG PO TABS: 40.0000 mg | ORAL_TABLET | Freq: Every day | ORAL | Status: DC

## 2012-11-08 MED ORDER — LEVETIRACETAM 500 MG PO TABS: 500.0000 mg | ORAL_TABLET | Freq: Two times a day (BID) | ORAL | Status: DC

## 2012-11-08 MED ORDER — AMLODIPINE BESYLATE 10 MG PO TABS: 10.0000 mg | ORAL_TABLET | Freq: Every day | ORAL | Status: DC

## 2012-11-08 MED ORDER — POLYETHYLENE GLYCOL 3350 17 GM/SCOOP PO POWD: 17.0000 g | Freq: Every day | ORAL | Status: DC

## 2012-11-08 NOTE — ED Notes (Signed)
Per son: pt is running out of all of her meds; needs a PCP.  Ran out of HTN med yesterday.  Denies any other c/o's.  Reports CBG = 270 this morning.  Pt lives with son as caregiver.

## 2012-11-08 NOTE — ED Provider Notes (Signed)
Chief Complaint:   Chief Complaint  Patient presents with  . Medication Refill    History of Present Illness:   April [name] is an 79 year old female who comes in today for refill on her medications. She brought in by her son. She's been out of medications for approximately one day. She was hospitalized in February of this year on the neurosurgical service under the care of Dr. Newell Coral. She was hospitalized for a subdural hematoma which was nontraumatic. She remained in the hospital for about a month and underwent bur holes and drainage of the subdural hematoma. The piece of the cranium had been removed was implanted in her stomach which she still has and Dr.Nudelman plans to replace it in a few months. She had been doing well at home, but her son had trouble finding a primary care physician, so she's out of all her medications. Her only problem at home has been constipation. She's a little bit confused but ambulatory. She is able to talk. She hasn't had any chest pain, shortness of breath, or abdominal pain. Her current meds include amlodipine 10 mg daily, atorvastatin 40 mg daily, Catapres TTS patch 3, once per week, irbesartan 300 mg once daily, metoprolol tolerate 12.5 mg twice a day, Keppra 500 mg twice a day, and Janumet 50 mg once daily. Her sugars are running a little bit high, this morning her glucose was 270. She's not had any hypoglycemic episodes.  Review of Systems:  Other than noted above, the patient denies any of the following symptoms. Systemic:  No fever, chills, sweats, fatigue, myalgias, headache, or anorexia. Eye:  No redness, pain or drainage. ENT:  No earache, nasal congestion, rhinorrhea, sinus pressure, or sore throat. Lungs:  No cough, sputum production, wheezing, shortness of breath.  Cardiovascular:  No chest pain, palpitations, or syncope. GI:  No nausea, vomiting, abdominal pain or diarrhea. GU:  No dysuria, frequency, or hematuria. Skin:  No rash or  pruritis.  PMFSH:  Past medical history, family history, social history, meds, and allergies were reviewed.    Physical Exam:   Vital signs:  BP 198/98  Pulse 73  Temp(Src) 99.1 F (37.3 C) (Oral)  SpO2 100% General:  Alert, in no distress. Eye:  PERRL, full EOMs.  Lids and conjunctivas were normal. ENT:  TMs and canals were normal, without erythema or inflammation.  Nasal mucosa was clear and uncongested, without drainage.  Mucous membranes were moist.  Pharynx was clear, without exudate or drainage.  There were no oral ulcerations or lesions. Neck:  Supple, no adenopathy, tenderness or mass. Thyroid was normal. Lungs:  No respiratory distress.  Lungs were clear to auscultation, without wheezes, rales or rhonchi.  Breath sounds were clear and equal bilaterally. Heart:  Regular rhythm, without gallops, murmers or rubs. Abdomen:  Soft, flat, and non-tender to palpation.  No hepatosplenomagaly or mass. Skin:  Clear, warm, and dry, without rash or lesions.  Labs:   Results for orders placed during the hospital encounter of 11/08/12  GLUCOSE, CAPILLARY      Result Value Range   Glucose-Capillary 238 (*) 70 - 99 mg/dL    Assessment:  The primary encounter diagnosis was Subdural hemorrhage. Diagnoses of Hypertension, Type 2 diabetes mellitus, and Hypercholesterolemia were also pertinent to this visit.  I think her blood sugar and blood pressure high because she's been off of medications and should come down when she gets back on them again. She was given refills on medications and referred to the [facility] for primary care followup.  Plan:   1.  The following meds were prescribed:   Discharge Medication List as of 11/08/2012  6:53 PM    START taking these medications   Details  !! amLODipine (NORVASC) 10 MG tablet Take 1 tablet (10 mg total) by mouth daily., Starting 11/08/2012, Until Discontinued, Normal    !! atorvastatin (LIPITOR) 40 MG tablet Take 1 tablet  (40 mg total) by mouth daily., Starting 11/08/2012, Until Discontinued, Normal    !! cloNIDine (CATAPRES-TTS-3) 0.3 mg/24hr Place 1 patch (0.3 mg total) onto the skin once a week., Starting 11/08/2012, Until Discontinued, Normal    !! irbesartan (AVAPRO) 300 MG tablet Take 1 tablet (300 mg total) by mouth at bedtime., Starting 11/08/2012, Until Discontinued, Normal    !! levETIRAcetam (KEPPRA) 500 MG tablet Take 1 tablet (500 mg total) by mouth every 12 (twelve) hours., Starting 11/08/2012, Until Discontinued, Normal    !! metoprolol tartrate (LOPRESSOR) 12.5 mg TABS Take 0.5 tablets (12.5 mg total) by mouth 2 (two) times daily., Starting 11/08/2012, Until Discontinued, Normal    polyethylene glycol powder (MIRALAX) powder Take 17 g by mouth daily., Starting 11/08/2012, Until Discontinued, Normal    !! sitaGLIPtin (JANUVIA) 50 MG tablet Take 1 tablet (50 mg total) by mouth daily., Starting 11/08/2012, Until Discontinued, Normal     !! - Potential duplicate medications found. Please discuss with provider.     2.  The patient was instructed in symptomatic care and handouts were given. 3.  The patient was told to return if becoming worse in any way, if no better in 3 or 4 days, and given some red flag symptoms such as headache or changing neurological symptoms that would indicate earlier return. 4.  Follow up at the [facility] as soon as possible.       Reuben Likes, MD 11/08/12 2157

## 2012-11-12 ENCOUNTER — Ambulatory Visit
Admission: RE | Admit: 2012-11-12 | Discharge: 2012-11-12 | Disposition: A | Discharge status: Home / Self Care | Payer: Medicare Other | Source: Ambulatory Visit | Attending: Neurosurgery | Admitting: Neurosurgery

## 2012-11-12 DIAGNOSIS — I621 Nontraumatic extradural hemorrhage: Secondary | ICD-10-CM

## 2012-11-22 ENCOUNTER — Other Ambulatory Visit: Payer: Self-pay | Admitting: Neurosurgery

## 2012-11-26 ENCOUNTER — Ambulatory Visit: Payer: Medicare Other | Attending: Family Medicine | Admitting: Internal Medicine

## 2012-11-26 ENCOUNTER — Encounter: Payer: Self-pay | Admitting: Internal Medicine

## 2012-11-26 VITALS — BP 208/77 | HR 72 | Temp 98.4°F | Resp 15 | Ht 60.0 in | Wt 126.4 lb

## 2012-11-26 DIAGNOSIS — I10 Essential (primary) hypertension: Secondary | ICD-10-CM | POA: Insufficient documentation

## 2012-11-26 DIAGNOSIS — E119 Type 2 diabetes mellitus without complications: Secondary | ICD-10-CM | POA: Insufficient documentation

## 2012-11-26 DIAGNOSIS — E785 Hyperlipidemia, unspecified: Secondary | ICD-10-CM

## 2012-11-26 LAB — TSH: TSH: 2.094 u[IU]/mL (ref 0.350–4.500)

## 2012-11-26 LAB — POCT GLYCOSYLATED HEMOGLOBIN (HGB A1C): Hemoglobin A1C: 9.9

## 2012-11-26 LAB — LIPID PANEL
HDL: 46 mg/dL (ref >39)
Triglycerides: 242 mg/dL — ABNORMAL HIGH (ref <150)

## 2012-11-26 MED ORDER — CLONIDINE HCL 0.1 MG PO TABS: 0.3000 mg | ORAL_TABLET | Freq: Three times a day (TID) | ORAL | Status: DC

## 2012-11-26 NOTE — Progress Notes (Signed)
Patient ID: April [name], female   DOB: 1921/12/04, 79 y.o.   MRN: [id number]  CC: Establishing care  HPI: 79 year old female with past medical history of hypertension, dyslipidemia, seizure disorder and diabetes who presented to clinic to establish primary care provider. Patient has no current complaints at this time. She has no shortness of breath, chest pain or palpitations. No abdominal pain, nausea or vomiting. She is mostly in a wheelchair but has no complaints of pain.  No Known Allergies Past Medical History  Diagnosis Date  . Diabetes mellitus   . Hypertension   . Hyperlipidemia   . Pneumonia   . Subdural hematoma   . Arthritis   . DVT (deep venous thrombosis)   . Incontinence   . Renal disorder     chronic kidney dz stage III   Current Outpatient Prescriptions on File Prior to Visit  Medication Sig Dispense Refill  . amLODipine (NORVASC) 10 MG tablet Take 1 tablet (10 mg total) by mouth daily.  30 tablet  1  . atorvastatin (LIPITOR) 40 MG tablet Take 1 tablet (40 mg total) by mouth every evening.  30 tablet  1  . irbesartan (AVAPRO) 300 MG tablet Take 1 tablet (300 mg total) by mouth at bedtime.  30 tablet  3  . levETIRAcetam (KEPPRA) 500 MG tablet Take 1 tablet (500 mg total) by mouth 2 (two) times daily.  60 tablet  1  . metoprolol tartrate (LOPRESSOR) 12.5 mg TABS Take 0.5 tablets (12.5 mg total) by mouth 2 (two) times daily.  60 tablet  1  . Multiple Vitamin (MULTIVITAMIN WITH MINERALS) TABS Take 1 tablet by mouth daily.      . polyethylene glycol powder (MIRALAX) powder Take 17 g by mouth daily.  255 g  0  . sitaGLIPtin (JANUVIA) 50 MG tablet Take 1 tablet (50 mg total) by mouth daily.  30 tablet  1   No current facility-administered medications on file prior to visit.   Family History  Problem Relation Age of Onset  . Diabetes Mellitus II Son    History   Social History  . Marital Status: Divorced    Spouse Name: N/A    Number of Children: N/A  . Years of  Education: N/A   Occupational History  . Not on file.   Social History Main Topics  . Smoking status: Never Smoker   . Smokeless tobacco: Never Used  . Alcohol Use: No  . Drug Use: No  . Sexually Active: Not on file   Other Topics Concern  . Not on file   Social History Narrative  . No narrative on file    Review of Systems  Constitutional: Negative for fever, chills, diaphoresis, activity change, appetite change and fatigue.  HENT: Negative for ear pain, nosebleeds, congestion, facial swelling, rhinorrhea, neck pain, neck stiffness and ear discharge.   Eyes: Negative for pain, discharge, redness, itching and visual disturbance.  Respiratory: Negative for cough, choking, chest tightness, shortness of breath, wheezing and stridor.   Cardiovascular: Negative for chest pain, palpitations and leg swelling.  Gastrointestinal: Negative for abdominal distention.  Genitourinary: Negative for dysuria, urgency, frequency, hematuria, flank pain, decreased urine volume, difficulty urinating and dyspareunia.  Musculoskeletal: Negative for back pain, joint swelling, arthralgias and gait problem.  Neurological: Negative for dizziness, tremors, seizures, syncope, facial asymmetry, speech difficulty, weakness, light-headedness, numbness and headaches.  Hematological: Negative for adenopathy. Does not bruise/bleed easily.  Psychiatric/Behavioral: Negative for hallucinations, behavioral problems, confusion, dysphoric mood, decreased concentration and agitation.  Objective:   Filed Vitals:   11/26/12 1131  BP: 208/77  Pulse: 72  Temp: 98.4 F (36.9 C)  Resp: 15    Physical Exam  Constitutional: Appears well-developed and well-nourished. No distress.  HENT: Normocephalic. External right and left ear normal. Oropharynx is clear and moist.  Eyes: Conjunctivae and EOM are normal. PERRLA, no scleral icterus.  Neck: Normal ROM. Neck supple. No JVD. No tracheal deviation. No thyromegaly.   CVS: RRR, S1/S2 appreciated  Pulmonary: Effort and breath sounds normal, no stridor, rhonchi, wheezes, rales.  Abdominal: Soft. BS +,  no distension, tenderness, rebound or guarding.  Musculoskeletal: Normal range of motion. No edema and no tenderness.  Lymphadenopathy: No lymphadenopathy noted, cervical, inguinal. Neuro: Alert. Normal reflexes, muscle tone coordination. No cranial nerve deficit. Skin: Skin is warm and dry. No rash noted. Not diaphoretic. No erythema. No pallor.  Psychiatric: Normal mood and affect. Behavior, judgment, thought content normal.   Lab Results  Component Value Date   WBC 7.7 08/14/2012   HGB 8.8* 08/14/2012   HCT 25.8* 08/14/2012   MCV 79.1 08/14/2012   PLT 267 08/14/2012   Lab Results  Component Value Date   CREATININE 2.01* 08/17/2012   BUN 29* 08/17/2012   NA 140 08/17/2012   K 3.7 08/17/2012   CL 108 08/17/2012   CO2 23 08/17/2012    Lab Results  Component Value Date   HGBA1C 12.3* 07/24/2012   Lipid Panel     Component Value Date/Time   CHOL 198 06/19/2012 1310   TRIG 227* 06/19/2012 1310   HDL 45 06/19/2012 1310   CHOLHDL 4.4 06/19/2012 1310   VLDL 45* 06/19/2012 1310   LDLCALC 108* 06/19/2012 1310       Assessment and plan:   Patient Active Problem List   Diagnosis Date Noted  . HTN (hypertension) - We have discussed target BP range - I have advised pt to check BP regularly and to call us back if the numbers are higher than 140/90 - discussed the importance of compliance with medical therapy and diet  - SBP on this visit is high at 208; will change catapres patch to by mouth regimen, 0.1 mg TID (she is on 0.3 mg weekly patch)  - Continue Norvasc, Avapro and metoprolol  07/03/2012  . Diabetes mellitus. - continue [place]; last A1c above 12 in 07/2012 but per patient's son patient has not been compliant with medications - check A1c level today 06/20/2012  . Hyperlipidemia - continue atorvastatin - check lipid panel today  06/20/2012

## 2012-11-26 NOTE — Patient Instructions (Signed)
Hypertension As your heart beats, it forces blood through your arteries. This force is your blood pressure. If the pressure is too high, it is called hypertension (HTN) or high blood pressure. HTN is dangerous because you may have it and not know it. High blood pressure may mean that your heart has to work harder to pump blood. Your arteries may be narrow or stiff. The extra work puts you at risk for heart disease, stroke, and other problems.  Blood pressure consists of two numbers, a higher number over a lower, 110/72, for example. It is stated as "110 over 72." The ideal is below 120 for the top number (systolic) and under 80 for the bottom (diastolic). Write down your blood pressure today. You should pay close attention to your blood pressure if you have certain conditions such as:  Heart failure.  Prior heart attack.  Diabetes  Chronic kidney disease.  Prior stroke.  Multiple risk factors for heart disease. To see if you have HTN, your blood pressure should be measured while you are seated with your arm held at the level of the heart. It should be measured at least twice. A one-time elevated blood pressure reading (especially in the Emergency Department) does not mean that you need treatment. There may be conditions in which the blood pressure is different between your right and left arms. It is important to see your caregiver soon for a recheck. Most people have essential hypertension which means that there is not a specific cause. This type of high blood pressure may be lowered by changing lifestyle factors such as:  Stress.  Smoking.  Lack of exercise.  Excessive weight.  Drug/tobacco/alcohol use.  Eating less salt. Most people do not have symptoms from high blood pressure until it has caused damage to the body. Effective treatment can often prevent, delay or reduce that damage. TREATMENT  When a cause has been identified, treatment for high blood pressure is directed at the  cause. There are a large number of medications to treat HTN. These fall into several categories, and your caregiver will help you select the medicines that are best for you. Medications may have side effects. You should review side effects with your caregiver. If your blood pressure stays high after you have made lifestyle changes or started on medicines,   Your medication(s) may need to be changed.  Other problems may need to be addressed.  Be certain you understand your prescriptions, and know how and when to take your medicine.  Be sure to follow up with your caregiver within the time frame advised (usually within two weeks) to have your blood pressure rechecked and to review your medications.  If you are taking more than one medicine to lower your blood pressure, make sure you know how and at what times they should be taken. Taking two medicines at the same time can result in blood pressure that is too low. SEEK IMMEDIATE MEDICAL CARE IF:  You develop a severe headache, blurred or changing vision, or confusion.  You have unusual weakness or numbness, or a faint feeling.  You have severe chest or abdominal pain, vomiting, or breathing problems. MAKE SURE YOU:   Understand these instructions.  Will watch your condition.  Will get help right away if you are not doing well or get worse. Document Released: 05/23/2005 Document Revised: 08/15/2011 Document Reviewed: 01/11/2008 [facility] Patient Information 2014 [place], [place]. Monitoring for Diabetes There are two blood tests that help you monitor and manage your diabetes.  These include:  An A1c (hemoglobin A1c) test.  This test is an average of your glucose (or blood sugar) control over the past 3 months.  This is recommended as a way for you and your caregiver to understand how well your glucose levels are controlled on the average.  Your A1c goal will be determined by your caregiver, but it is usually best if it is less than 6.5%  to 7.0%.  Glucose (sugar) attaches itself to red blood cells. The amount of glucose then can then be measured. The amount of glucose on the cells depends on how high your blood glucose has been.  SMBG test (self-monitoring blood glucose).  Using a blood glucose monitor (meter) to do SMBG testing is an easy way to monitor the amount of glucose in your blood and can help you improve your control. The monitor will tell you what your blood glucose is at that very moment. Every person with diabetes should have a blood glucose monitor and know how to use it. The better you control your blood sugar on a daily basis, the better your A1c levels will be. HOW OFTEN SHOULD I HAVE AN A1C LEVEL?  Every 3 months if your diabetes is not well controlled or if therapy has changed.  Every 6 months if you are meeting your treatment goals. HOW OFTEN SHOULD I DO SMBG TESTING?  Your caregiver will recommend how often you should test. Testing times are based on the kind of medicine you take, type of diabetes you have, and your blood glucose control. Testing times can include:  Type 1 diabetes: test 3 or 4 times a day or as directed.  Type 2 diabetes and if you are taking insulin and diabetes pills: test 3 or 4 times a day or as directed.  If you are taking diabetes pills only and not reaching your target A1c: test 2 to 4 times a day or as directed.  If you are taking diabetes pills and are controlling your diabetes well with diet and exercise, your caregiver will help you decide what is appropriate. WHAT TIME OF DAY SHOULD I TEST?  The best time of day to test your blood glucose depends on medications, mealtimes, exercise, and blood glucose control. It is best to test at different times because this will help you know how you are doing throughout the day. Your caregiver will help you decide what is best. WHAT SHOULD MY BLOOD GLUCOSE BE? Blood glucose target goals may vary depending on each persons needs, whether  they have type 1 or type 2 diabetes or what medications they are taking. However, as a general rule, blood glucose should be:  Before meals   70-130 mg/dl.  After meals    ..less than 180 mg/dl. CHECK YOUR BLOOD GLUCOSE IF:  You have symptoms of low blood sugar (hypoglycemia), which may include dizziness, shaking, sweating, chills and confusion.  You have symptoms of high blood sugar (hyperglycemia), which may include sleepiness, blurred vision, frequent urination and excessive thirst.  You are learning how meals, physical activity and medicine affect your blood glucose level. The more you learn about how various foods, your medications, and activities affect you, the better job you will do of taking care of yourself.  You have a job in which poor control could cause safety problems while driving or operating machinery. CHECK YOUR BLOOD SUGAR MORE FREQUENTLY:  If you have medication or dietary changes.  If you begin taking other kinds of medicines.  If you  become sick or your level of stress increases. With an illness, your blood sugar may even be high without eating.  Before and after exercise. Follow your caregiver's testing recommendations during this time.  TO DISPOSE OF SHARPS: Each city or state may have different regulations. Check with your public works or Engineer, structural.  Sharps containers can be purchased from pharmacies.  Place all used sharps in a container. You do not need to replace any protective covers over the needle or break the needle.  Sharps should be contained in a ridge, leakproof, puncture-resistant container.  Plastic detergent bottle.  Bleach bottle.  When container is almost full, add a solution that is 1 part laundry bleach and 9 parts tap water (it is okay to use undiluted bleach if you wish). You may want to wear gloves since bleach can damage tissue. Let the solution sit for 30 minutes.  Carefully pour all the liquid into the sanitary  sewer. Be sure to prevent the sharps from falling out.  Once liquid is drained, reseal the container with lid and tape it shut with duct tape. This will prevent the cap from coming off.  Dispose of the container with your regular household trash and waste. It is a good idea to let your trash hauler know that you will be disposing of sharps. Document Released: 05/26/2003 Document Revised: 02/15/2012 Document Reviewed: 11/24/2008 [facility] Patient Information 2014 [place], [place].

## 2012-11-26 NOTE — Progress Notes (Signed)
Patient here to establish care Has history of HTN

## 2012-11-27 MED ORDER — OMEGA-3 FATTY ACIDS 1000 MG PO CAPS: 2.0000 g | ORAL_CAPSULE | Freq: Every day | ORAL | Status: DC

## 2012-11-27 NOTE — Progress Notes (Signed)
Patient ID: April [name], female   DOB: 1921-06-30, 79 y.o.   MRN: [id number] I have reviewed the lipid panel for the patient. Triglycerides are above the goal. We will start fish oil omega-3.  Manson Passey [facility] [phone]

## 2012-11-27 NOTE — Addendum Note (Signed)
Addended by: Alison Murray on: 11/27/2012 09:41 AM   Modules accepted: Orders

## 2012-12-04 ENCOUNTER — Encounter (HOSPITAL_COMMUNITY)
Admission: RE | Admit: 2012-12-04 | Discharge: 2012-12-04 | Disposition: A | Discharge status: Home / Self Care | Payer: Medicare Other | Source: Ambulatory Visit | Attending: Neurosurgery | Admitting: Neurosurgery

## 2012-12-04 NOTE — Pre-Procedure Instructions (Signed)
Mikayah Joy  12/04/2012   Your procedure is scheduled on:  12/12/12  Report to [facility] at 630AM.  Call this number if you have problems the morning of surgery: [phone]   Remember:   Do not eat food or drink liquids after midnight.   Take these medicines the morning of surgery with A SIP OF WATER: norvasc, clonidine, keppra, metoprolol              STOP  Fish oil 12/07/12   Do not wear jewelry, make-up or nail polish.  Do not wear lotions, powders, or perfumes. You may wear deodorant.  Do not shave 48 hours prior to surgery. Men may shave face and neck.  Do not bring valuables to the hospital.  [facility] is not responsible                   for any belongings or valuables.  Contacts, dentures or bridgework may not be worn into surgery.  Leave suitcase in the car. After surgery it may be brought to your room.  For patients admitted to the hospital, checkout time is 11:00 AM the day of  discharge.   Patients discharged the day of surgery will not be allowed to drive  home.  Name and phone number of your driver:   Special Instructions: Shower using CHG 2 nights before surgery and the night before surgery.  If you shower the day of surgery use CHG.  Use special wash - you have one bottle of CHG for all showers.  You should use approximately 1/3 of the bottle for each shower.   Please read over the following fact sheets that you were given: Pain Booklet, Coughing and Deep Breathing and Surgical Site Infection Prevention

## 2012-12-06 ENCOUNTER — Encounter (HOSPITAL_COMMUNITY)
Admission: RE | Admit: 2012-12-06 | Discharge: 2012-12-06 | Disposition: A | Discharge status: Home / Self Care | Payer: Medicare Other | Source: Ambulatory Visit | Attending: Neurosurgery | Admitting: Neurosurgery

## 2012-12-06 ENCOUNTER — Ambulatory Visit (HOSPITAL_COMMUNITY)
Admission: RE | Admit: 2012-12-06 | Discharge: 2012-12-06 | Disposition: A | Discharge status: Home / Self Care | Payer: Medicare Other | Source: Ambulatory Visit | Attending: Anesthesiology | Admitting: Anesthesiology

## 2012-12-06 ENCOUNTER — Encounter (HOSPITAL_COMMUNITY): Payer: Self-pay

## 2012-12-06 DIAGNOSIS — M479 Spondylosis, unspecified: Secondary | ICD-10-CM | POA: Insufficient documentation

## 2012-12-06 DIAGNOSIS — Z0183 Encounter for blood typing: Secondary | ICD-10-CM | POA: Insufficient documentation

## 2012-12-06 DIAGNOSIS — R000 Tachycardia, unspecified: Secondary | ICD-10-CM | POA: Insufficient documentation

## 2012-12-06 DIAGNOSIS — Z01812 Encounter for preprocedural laboratory examination: Secondary | ICD-10-CM | POA: Insufficient documentation

## 2012-12-06 DIAGNOSIS — I700 Atherosclerosis of aorta: Secondary | ICD-10-CM | POA: Insufficient documentation

## 2012-12-06 DIAGNOSIS — I129 Hypertensive chronic kidney disease with stage 1 through stage 4 chronic kidney disease, or unspecified chronic kidney disease: Secondary | ICD-10-CM | POA: Insufficient documentation

## 2012-12-06 DIAGNOSIS — E119 Type 2 diabetes mellitus without complications: Secondary | ICD-10-CM | POA: Insufficient documentation

## 2012-12-06 DIAGNOSIS — I10 Essential (primary) hypertension: Secondary | ICD-10-CM | POA: Insufficient documentation

## 2012-12-06 DIAGNOSIS — Z01810 Encounter for preprocedural cardiovascular examination: Secondary | ICD-10-CM | POA: Insufficient documentation

## 2012-12-06 DIAGNOSIS — Z01818 Encounter for other preprocedural examination: Secondary | ICD-10-CM | POA: Insufficient documentation

## 2012-12-06 DIAGNOSIS — N189 Chronic kidney disease, unspecified: Secondary | ICD-10-CM | POA: Insufficient documentation

## 2012-12-06 DIAGNOSIS — M949 Disorder of cartilage, unspecified: Secondary | ICD-10-CM | POA: Insufficient documentation

## 2012-12-06 DIAGNOSIS — M899 Disorder of bone, unspecified: Secondary | ICD-10-CM | POA: Insufficient documentation

## 2012-12-06 DIAGNOSIS — E785 Hyperlipidemia, unspecified: Secondary | ICD-10-CM | POA: Insufficient documentation

## 2012-12-06 DIAGNOSIS — R9431 Abnormal electrocardiogram [ECG] [EKG]: Secondary | ICD-10-CM | POA: Insufficient documentation

## 2012-12-06 LAB — CBC
HCT: 30.1 % — ABNORMAL LOW (ref 36.0–46.0)
MCH: 26.7 pg (ref 26.0–34.0)
MCV: 78.8 fL (ref 78.0–100.0)
RBC: 3.82 MIL/uL — ABNORMAL LOW (ref 3.87–5.11)
WBC: 7.4 10*3/uL (ref 4.0–10.5)

## 2012-12-06 LAB — BASIC METABOLIC PANEL
CO2: 29 mEq/L (ref 19–32)
Calcium: 10 mg/dL (ref 8.4–10.5)
Chloride: 102 mEq/L (ref 96–112)
Glucose, Bld: 242 mg/dL — ABNORMAL HIGH (ref 70–99)
Sodium: 141 mEq/L (ref 135–145)

## 2012-12-06 LAB — TYPE AND SCREEN
ABO/RH(D): B POS
Antibody Screen: NEGATIVE

## 2012-12-06 LAB — SURGICAL PCR SCREEN
MRSA, PCR: NEGATIVE
Staphylococcus aureus: NEGATIVE

## 2012-12-06 NOTE — Progress Notes (Signed)
Anesthesia PAT Evaluation:  Patient is a 79 year old female scheduled for cranioplasty with retrieval of bone flap from abdomen wall pocket on 12/12/12 by Dr. Newell Coral.  She is s/p craniotomy for evacuation of SDH on 07/23/12 and 3/303/14 for acute on chronic SDH.  Other history includes non-smoker, DM2, HTN, HLD, CKD, arthritis, DVT s/p IVC filter.  BP was 180/102 this morning (patient had not taken any of her four antihypertensive medications: amlodipine, clonidine, irbesartan, metoprolol).  Her PCP is Dr. Standley Dakins at the [facility] across from [facility].  Patient lives with her son.  She does very little walking and was examined today in a wheelchair.    She appears to be a frail elderly female, but in no acute distress.  She feels at her baseline.  She denies seizures or headaches, chest pain, or SOB.  Exam shows her heart with regular rhythm but tachycardic in the mid 110's.  Lungs clear.  1+ pedal edema.      EKG was repeated today due to tachycardia.  Results showed ST @ 114 bpm, anteroseptal infarct (age undetermined), prolonged QT.  Prolonged QT was new since 07/23/12.  CXR on 12/06/12 showed: Minimal linear fibrotic change in lower left midlung area is stable. Small area of subsegmental atelectasis or fibrosis is seen in the lower right midlung area. No pulmonary edema, pneumonia, or other acute abnormality is evident. Chronic findings are stable and detailed above.  Preoperative labs noted.  Cr slightly more elevated at 2.46 with baseline ~ 2.0.  H/H 10.2/30.1, improved since 08/14/12.  Due to her tachycardia, hypertension, and new prolonged QT I did discuss case with anesthesiologist Dr. Noreene Larsson who also evaluated Ms. Mallick.  Patient tolerated two craniotomies within the past five months, and since she was asymptomatic he felt patient could likely proceed as planned if no acute changes. Her son was able to administer patient her medications prior to leaving her PAT visit which we  felt should improve her BP and HR.  She will be re-evaluated by her assigned anesthesiologist on the day of surgery to discuss the definitive anesthesia plan.  Patient and son [name] were told to contact her PCP or EMS if any acute changes or new symptomology prior to her surgery date.  Velna Ochs [facility]/Anesthesiology Phone [phone] 12/06/2012 11:24 AM

## 2012-12-11 MED ORDER — CEFAZOLIN SODIUM-DEXTROSE 2-3 GM-% IV SOLR
2.0000 g | INTRAVENOUS | Status: AC
  Administered 2012-12-12: 2 g via INTRAVENOUS
  Filled 2012-12-11: qty 50

## 2012-12-12 ENCOUNTER — Inpatient Hospital Stay (HOSPITAL_COMMUNITY)
Admission: RE | Admit: 2012-12-12 | Discharge: 2012-12-21 | DRG: 516 | Disposition: A | Discharge status: Home / Self Care | Payer: Medicare Other | Source: Ambulatory Visit | Attending: Neurosurgery | Admitting: Neurosurgery

## 2012-12-12 ENCOUNTER — Encounter (HOSPITAL_COMMUNITY): Payer: Self-pay | Admitting: Vascular Surgery

## 2012-12-12 ENCOUNTER — Encounter (HOSPITAL_COMMUNITY)
Admission: RE | Disposition: A | Discharge status: Home / Self Care | Payer: Self-pay | Source: Ambulatory Visit | Attending: Neurosurgery

## 2012-12-12 ENCOUNTER — Inpatient Hospital Stay (HOSPITAL_COMMUNITY): Payer: Medicare Other | Admitting: Anesthesiology

## 2012-12-12 ENCOUNTER — Encounter (HOSPITAL_COMMUNITY): Payer: Self-pay | Admitting: *Deleted

## 2012-12-12 DIAGNOSIS — N183 Chronic kidney disease, stage 3 unspecified: Secondary | ICD-10-CM | POA: Diagnosis present

## 2012-12-12 DIAGNOSIS — I82409 Acute embolism and thrombosis of unspecified deep veins of unspecified lower extremity: Secondary | ICD-10-CM | POA: Diagnosis present

## 2012-12-12 DIAGNOSIS — S065X9A Traumatic subdural hemorrhage with loss of consciousness of unspecified duration, initial encounter: Secondary | ICD-10-CM

## 2012-12-12 DIAGNOSIS — Z86718 Personal history of other venous thrombosis and embolism: Secondary | ICD-10-CM

## 2012-12-12 DIAGNOSIS — I10 Essential (primary) hypertension: Secondary | ICD-10-CM

## 2012-12-12 DIAGNOSIS — F0390 Unspecified dementia without behavioral disturbance: Secondary | ICD-10-CM | POA: Diagnosis present

## 2012-12-12 DIAGNOSIS — N179 Acute kidney failure, unspecified: Secondary | ICD-10-CM

## 2012-12-12 DIAGNOSIS — E785 Hyperlipidemia, unspecified: Secondary | ICD-10-CM | POA: Diagnosis present

## 2012-12-12 DIAGNOSIS — IMO0001 Reserved for inherently not codable concepts without codable children: Secondary | ICD-10-CM | POA: Diagnosis present

## 2012-12-12 DIAGNOSIS — I82403 Acute embolism and thrombosis of unspecified deep veins of lower extremity, bilateral: Secondary | ICD-10-CM

## 2012-12-12 DIAGNOSIS — Z9889 Other specified postprocedural states: Secondary | ICD-10-CM

## 2012-12-12 DIAGNOSIS — R339 Retention of urine, unspecified: Secondary | ICD-10-CM | POA: Diagnosis not present

## 2012-12-12 DIAGNOSIS — E119 Type 2 diabetes mellitus without complications: Secondary | ICD-10-CM

## 2012-12-12 DIAGNOSIS — M129 Arthropathy, unspecified: Secondary | ICD-10-CM | POA: Diagnosis present

## 2012-12-12 DIAGNOSIS — R600 Localized edema: Secondary | ICD-10-CM

## 2012-12-12 DIAGNOSIS — I6200 Nontraumatic subdural hemorrhage, unspecified: Secondary | ICD-10-CM

## 2012-12-12 DIAGNOSIS — I129 Hypertensive chronic kidney disease with stage 1 through stage 4 chronic kidney disease, or unspecified chronic kidney disease: Secondary | ICD-10-CM | POA: Diagnosis present

## 2012-12-12 DIAGNOSIS — M952 Other acquired deformity of head: Principal | ICD-10-CM | POA: Diagnosis present

## 2012-12-12 HISTORY — PX: CRANIOTOMY: SHX93

## 2012-12-12 LAB — GLUCOSE, CAPILLARY
Glucose-Capillary: 147 mg/dL — ABNORMAL HIGH (ref 70–99)
Glucose-Capillary: 189 mg/dL — ABNORMAL HIGH (ref 70–99)

## 2012-12-12 SURGERY — CRANIOTOMY BONE FLAP/PROSTHETIC PLATE
Anesthesia: General | Site: Head | Wound class: Clean

## 2012-12-12 MED ORDER — AMLODIPINE BESYLATE 10 MG PO TABS
10.0000 mg | ORAL_TABLET | Freq: Every day | ORAL | Status: DC
  Administered 2012-12-13 – 2012-12-21 (×9): 10 mg via ORAL
  Filled 2012-12-12 (×9): qty 1

## 2012-12-12 MED ORDER — HYDRALAZINE HCL 20 MG/ML IJ SOLN
5.0000 mg | INTRAMUSCULAR | Status: DC | PRN
  Administered 2012-12-12 (×2): 5 mg via INTRAVENOUS
  Administered 2012-12-15 – 2012-12-17 (×2): 10 mg via INTRAVENOUS
  Filled 2012-12-12 (×2): qty 1

## 2012-12-12 MED ORDER — 0.9 % SODIUM CHLORIDE (POUR BTL) OPTIME
TOPICAL | Status: DC | PRN
  Administered 2012-12-12: 1000 mL

## 2012-12-12 MED ORDER — METOPROLOL TARTRATE 12.5 MG HALF TABLET: 6.2500 mg | ORAL_TABLET | Freq: Two times a day (BID) | ORAL | Status: DC

## 2012-12-12 MED ORDER — LINAGLIPTIN 5 MG PO TABS
5.0000 mg | ORAL_TABLET | Freq: Every day | ORAL | Status: DC
  Administered 2012-12-13 – 2012-12-21 (×9): 5 mg via ORAL
  Filled 2012-12-12 (×9): qty 1

## 2012-12-12 MED ORDER — SODIUM CHLORIDE 0.9 % IV SOLN: 500.0000 mg | Freq: Once | INTRAVENOUS | Status: DC

## 2012-12-12 MED ORDER — ONDANSETRON HCL 4 MG/2ML IJ SOLN: 4.0000 mg | INTRAMUSCULAR | Status: DC | PRN

## 2012-12-12 MED ORDER — LIDOCAINE-EPINEPHRINE 1 %-1:100000 IJ SOLN
INTRAMUSCULAR | Status: DC | PRN
  Administered 2012-12-12: 5 mL
  Administered 2012-12-12: 3 mL

## 2012-12-12 MED ORDER — IRBESARTAN 300 MG PO TABS
300.0000 mg | ORAL_TABLET | Freq: Every day | ORAL | Status: DC
  Administered 2012-12-12 – 2012-12-20 (×9): 300 mg via ORAL
  Filled 2012-12-12 (×11): qty 1

## 2012-12-12 MED ORDER — BUPIVACAINE HCL (PF) 0.5 % IJ SOLN
INTRAMUSCULAR | Status: DC | PRN
  Administered 2012-12-12: 3 mL
  Administered 2012-12-12: 5 mL

## 2012-12-12 MED ORDER — SODIUM CHLORIDE 0.9 % IV SOLN
INTRAVENOUS | Status: AC
  Filled 2012-12-12: qty 500

## 2012-12-12 MED ORDER — CLONIDINE HCL 0.3 MG PO TABS
0.3000 mg | ORAL_TABLET | Freq: Three times a day (TID) | ORAL | Status: DC
  Administered 2012-12-12 – 2012-12-21 (×27): 0.3 mg via ORAL
  Filled 2012-12-12 (×29): qty 1

## 2012-12-12 MED ORDER — SODIUM CHLORIDE 0.9 % IR SOLN
Status: DC | PRN
  Administered 2012-12-12: 10:00:00

## 2012-12-12 MED ORDER — FENTANYL CITRATE 0.05 MG/ML IJ SOLN
INTRAMUSCULAR | Status: DC | PRN
  Administered 2012-12-12: 25 ug via INTRAVENOUS
  Administered 2012-12-12: 100 ug via INTRAVENOUS
  Administered 2012-12-12 (×2): 25 ug via INTRAVENOUS

## 2012-12-12 MED ORDER — BISACODYL 10 MG RE SUPP: 10.0000 mg | Freq: Every day | RECTAL | Status: DC | PRN

## 2012-12-12 MED ORDER — HYDROCODONE-ACETAMINOPHEN 5-325 MG PO TABS
1.0000 | ORAL_TABLET | ORAL | Status: DC | PRN
  Administered 2012-12-12 – 2012-12-17 (×2): 1 via ORAL
  Filled 2012-12-12 (×2): qty 1

## 2012-12-12 MED ORDER — LIDOCAINE HCL (CARDIAC) 20 MG/ML IV SOLN
INTRAVENOUS | Status: DC | PRN
  Administered 2012-12-12: 80 mg via INTRAVENOUS

## 2012-12-12 MED ORDER — THROMBIN 20000 UNITS EX SOLR
CUTANEOUS | Status: DC | PRN
  Administered 2012-12-12: 10:00:00 via TOPICAL

## 2012-12-12 MED ORDER — MORPHINE SULFATE 2 MG/ML IJ SOLN
1.0000 mg | INTRAMUSCULAR | Status: DC | PRN
  Administered 2012-12-12 (×2): 2 mg via INTRAVENOUS
  Filled 2012-12-12 (×2): qty 1

## 2012-12-12 MED ORDER — ONDANSETRON HCL 4 MG PO TABS: 4.0000 mg | ORAL_TABLET | ORAL | Status: DC | PRN

## 2012-12-12 MED ORDER — ROCURONIUM BROMIDE 100 MG/10ML IV SOLN
INTRAVENOUS | Status: DC | PRN
  Administered 2012-12-12: 35 mg via INTRAVENOUS

## 2012-12-12 MED ORDER — LEVETIRACETAM 500 MG PO TABS
500.0000 mg | ORAL_TABLET | Freq: Two times a day (BID) | ORAL | Status: DC
  Administered 2012-12-12 – 2012-12-21 (×19): 500 mg via ORAL
  Filled 2012-12-12 (×20): qty 1

## 2012-12-12 MED ORDER — ATORVASTATIN CALCIUM 40 MG PO TABS
40.0000 mg | ORAL_TABLET | Freq: Every evening | ORAL | Status: DC
  Administered 2012-12-12 – 2012-12-20 (×8): 40 mg via ORAL
  Filled 2012-12-12 (×10): qty 1

## 2012-12-12 MED ORDER — GLYCOPYRROLATE 0.2 MG/ML IJ SOLN
INTRAMUSCULAR | Status: DC | PRN
  Administered 2012-12-12: 0.4 mg via INTRAVENOUS

## 2012-12-12 MED ORDER — SODIUM CHLORIDE 0.9 % IV SOLN
500.0000 mg | INTRAVENOUS | Status: AC
  Administered 2012-12-12: 500 mg via INTRAVENOUS
  Filled 2012-12-12 (×2): qty 5

## 2012-12-12 MED ORDER — PROPOFOL 10 MG/ML IV BOLUS
INTRAVENOUS | Status: DC | PRN
  Administered 2012-12-12: 120 mg via INTRAVENOUS

## 2012-12-12 MED ORDER — LABETALOL HCL 5 MG/ML IV SOLN: 5.0000 mg | INTRAVENOUS | Status: DC | PRN

## 2012-12-12 MED ORDER — POLYETHYLENE GLYCOL 3350 17 GM/SCOOP PO POWD
17.0000 g | Freq: Every day | ORAL | Status: DC
  Administered 2012-12-12 – 2012-12-16 (×4): 17 g via ORAL
  Filled 2012-12-12 (×2): qty 255

## 2012-12-12 MED ORDER — ONDANSETRON HCL 4 MG/2ML IJ SOLN
INTRAMUSCULAR | Status: DC | PRN
  Administered 2012-12-12: 4 mg via INTRAVENOUS

## 2012-12-12 MED ORDER — LACTATED RINGERS IV SOLN
INTRAVENOUS | Status: DC | PRN
  Administered 2012-12-12 (×2): via INTRAVENOUS

## 2012-12-12 MED ORDER — NEOSTIGMINE METHYLSULFATE 1 MG/ML IJ SOLN
INTRAMUSCULAR | Status: DC | PRN
  Administered 2012-12-12: 3 mg via INTRAVENOUS

## 2012-12-12 MED ORDER — METOPROLOL TARTRATE 25 MG/10 ML ORAL SUSPENSION
6.2500 mg | Freq: Two times a day (BID) | ORAL | Status: DC
  Administered 2012-12-12 – 2012-12-21 (×15): 6.25 mg via ORAL
  Filled 2012-12-12 (×20): qty 2.5

## 2012-12-12 MED ORDER — MAGNESIUM HYDROXIDE 400 MG/5ML PO SUSP: 30.0000 mL | Freq: Every day | ORAL | Status: DC | PRN

## 2012-12-12 MED ORDER — BACITRACIN 50000 UNITS IM SOLR
INTRAMUSCULAR | Status: AC
  Filled 2012-12-12: qty 1

## 2012-12-12 MED ORDER — HYDRALAZINE HCL 20 MG/ML IJ SOLN
INTRAMUSCULAR | Status: AC
  Filled 2012-12-12: qty 1

## 2012-12-12 MED ORDER — LABETALOL HCL 5 MG/ML IV SOLN
INTRAVENOUS | Status: DC | PRN
  Administered 2012-12-12: 5 mg via INTRAVENOUS

## 2012-12-12 MED ORDER — SODIUM CHLORIDE 0.9 % IV SOLN
INTRAVENOUS | Status: DC
  Administered 2012-12-12: 16:00:00 via INTRAVENOUS
  Administered 2012-12-13: 1000 mL via INTRAVENOUS
  Administered 2012-12-15 (×2): via INTRAVENOUS
  Administered 2012-12-16: 75 mL/h via INTRAVENOUS
  Administered 2012-12-17: 09:00:00 via INTRAVENOUS

## 2012-12-12 SURGICAL SUPPLY — 71 items
ADH SKN CLS APL DERMABOND .7 (GAUZE/BANDAGES/DRESSINGS) ×3
BAG DECANTER FOR FLEXI CONT (MISCELLANEOUS) ×2 IMPLANT
BANDAGE GAUZE 4  KLING STR (GAUZE/BANDAGES/DRESSINGS) ×1 IMPLANT
BANDAGE GAUZE ELAST BULKY 4 IN (GAUZE/BANDAGES/DRESSINGS) ×4 IMPLANT
BIT DRILL WIRE PASS 1.3MM (BIT) IMPLANT
BLADE SURG 10 STRL SS (BLADE) ×1 IMPLANT
BRUSH SCRUB EZ PLAIN DRY (MISCELLANEOUS) ×3 IMPLANT
BUR ACORN 6.0 PRECISION (BURR) IMPLANT
BUR ROUTER D-58 CRANI (BURR) ×1 IMPLANT
CANISTER SUCTION 2500CC (MISCELLANEOUS) ×2 IMPLANT
CLOTH BEACON ORANGE TIMEOUT ST (SAFETY) ×2 IMPLANT
CONT SPEC 4OZ CLIKSEAL STRL BL (MISCELLANEOUS) ×2 IMPLANT
CORDS BIPOLAR (ELECTRODE) ×2 IMPLANT
DERMABOND ADVANCED (GAUZE/BANDAGES/DRESSINGS) ×3
DERMABOND ADVANCED .7 DNX12 (GAUZE/BANDAGES/DRESSINGS) IMPLANT
DRAIN PENROSE 1/2X12 LTX STRL (WOUND CARE) IMPLANT
DRAIN SNY WOU 7FLT (WOUND CARE) IMPLANT
DRAPE NEUROLOGICAL W/INCISE (DRAPES) ×2 IMPLANT
DRAPE SURG 17X23 STRL (DRAPES) IMPLANT
DRAPE WARM FLUID 44X44 (DRAPE) ×1 IMPLANT
DRILL WIRE PASS 1.3MM (BIT)
DRSG ADAPTIC 3X8 NADH LF (GAUZE/BANDAGES/DRESSINGS) ×1 IMPLANT
DRSG PAD ABDOMINAL 8X10 ST (GAUZE/BANDAGES/DRESSINGS) IMPLANT
ELECT CAUTERY BLADE 6.4 (BLADE) ×2 IMPLANT
ELECT REM PT RETURN 9FT ADLT (ELECTROSURGICAL) ×2
ELECTRODE REM PT RTRN 9FT ADLT (ELECTROSURGICAL) ×1 IMPLANT
EVACUATOR SILICONE 100CC (DRAIN) IMPLANT
GAUZE SPONGE 4X4 16PLY XRAY LF (GAUZE/BANDAGES/DRESSINGS) IMPLANT
GLOVE BIOGEL PI IND STRL 7.0 (GLOVE) IMPLANT
GLOVE BIOGEL PI IND STRL 8 (GLOVE) ×1 IMPLANT
GLOVE BIOGEL PI INDICATOR 7.0 (GLOVE) ×2
GLOVE BIOGEL PI INDICATOR 8 (GLOVE) ×1
GLOVE ECLIPSE 7.5 STRL STRAW (GLOVE) ×2 IMPLANT
GLOVE EXAM NITRILE LRG STRL (GLOVE) IMPLANT
GLOVE EXAM NITRILE MD LF STRL (GLOVE) IMPLANT
GLOVE EXAM NITRILE XL STR (GLOVE) IMPLANT
GLOVE EXAM NITRILE XS STR PU (GLOVE) IMPLANT
GLOVE SURG SS PI 7.0 STRL IVOR (GLOVE) ×2 IMPLANT
GOWN BRE IMP SLV AUR LG STRL (GOWN DISPOSABLE) ×1 IMPLANT
GOWN BRE IMP SLV AUR XL STRL (GOWN DISPOSABLE) ×1 IMPLANT
GOWN STRL REIN 2XL LVL4 (GOWN DISPOSABLE) IMPLANT
HOOK DURA (MISCELLANEOUS) IMPLANT
KIT BASIN OR (CUSTOM PROCEDURE TRAY) ×2 IMPLANT
KIT ROOM TURNOVER OR (KITS) ×2 IMPLANT
NDL HYPO 25X1 1.5 SAFETY (NEEDLE) IMPLANT
NDL SPNL 22GX3.5 QUINCKE BK (NEEDLE) ×1 IMPLANT
NEEDLE HYPO 25X1 1.5 SAFETY (NEEDLE) ×2 IMPLANT
NEEDLE SPNL 22GX3.5 QUINCKE BK (NEEDLE) ×2 IMPLANT
NS IRRIG 1000ML POUR BTL (IV SOLUTION) ×2 IMPLANT
PACK CRANIOTOMY (CUSTOM PROCEDURE TRAY) ×2 IMPLANT
PAD ARMBOARD 7.5X6 YLW CONV (MISCELLANEOUS) ×3 IMPLANT
PATTIES SURGICAL .5 X.5 (GAUZE/BANDAGES/DRESSINGS) ×1 IMPLANT
PATTIES SURGICAL .5 X3 (DISPOSABLE) ×1 IMPLANT
PATTIES SURGICAL 1X1 (DISPOSABLE) ×1 IMPLANT
PIN MAYFIELD SKULL DISP (PIN) IMPLANT
PLATE 1.5 5HOLE SQUARE (Plate) ×2 IMPLANT
SCREW HT X DR EMRG 1.8X5MM (Screw) ×1 IMPLANT
SCREW SELF DRILL HT 1.5/4MM (Screw) ×6 IMPLANT
SPONGE GAUZE 4X4 12PLY (GAUZE/BANDAGES/DRESSINGS) ×2 IMPLANT
SPONGE NEURO XRAY DETECT 1X3 (DISPOSABLE) ×1 IMPLANT
SPONGE SURGIFOAM ABS GEL 100 (HEMOSTASIS) ×2 IMPLANT
STAPLER SKIN PROX WIDE 3.9 (STAPLE) ×2 IMPLANT
SUT ETHILON 3 0 FSL (SUTURE) IMPLANT
SUT NURALON 4 0 TR CR/8 (SUTURE) ×3 IMPLANT
SUT VIC AB 2-0 CP2 18 (SUTURE) ×4 IMPLANT
SYR 20ML ECCENTRIC (SYRINGE) ×2 IMPLANT
SYR CONTROL 10ML LL (SYRINGE) ×2 IMPLANT
TOWEL OR 17X24 6PK STRL BLUE (TOWEL DISPOSABLE) ×2 IMPLANT
TOWEL OR 17X26 10 PK STRL BLUE (TOWEL DISPOSABLE) ×4 IMPLANT
UNDERPAD 30X30 INCONTINENT (UNDERPADS AND DIAPERS) IMPLANT
WATER STERILE IRR 1000ML POUR (IV SOLUTION) ×2 IMPLANT

## 2012-12-12 NOTE — Transfer of Care (Signed)
Immediate Anesthesia Transfer of Care Note  Patient: April [name]  Procedure(s) Performed: Procedure(s) with comments: CRANIOTOMY BONE FLAP/PROSTHETIC PLATE retrieval of bone from abdomen (N/A) - cranioplasty with retrieval of bone flap from abdominal wall pocket. Site is head and abdomen.  Patient Location: PACU  Anesthesia Type:General  Level of Consciousness: awake and patient cooperative  Airway & Oxygen Therapy: Patient Spontanous Breathing and Patient connected to nasal cannula oxygen  Post-op Assessment: Report given to PACU RN, Post -op Vital signs reviewed and stable and Patient moving all extremities X 4  Post vital signs: Reviewed and stable  Complications: No apparent anesthesia complications

## 2012-12-12 NOTE — Op Note (Signed)
12/12/2012  10:47 AM  PATIENT:  April [name]  79 y.o. female  PRE-OPERATIVE DIAGNOSIS:  skull defect  POST-OPERATIVE DIAGNOSIS:  skull defect  PROCEDURE:  Procedure(s): CRANIOTOMY BONE FLAP/PROSTHETIC PLATE retrieval of bone from abdomen:  Right frontoparietal cranioplasty with patient's cranial flap, retrieval of cranial flap from right abdominal wall.  SURGEON:  Surgeon(s): [name], MD  ANESTHESIA:   general  EBL:  Total I/O In: -  Out: 76 [Urine:60; Blood:25]  BLOOD ADMINISTERED:none  COUNT:  Correct per nursing staff  DICTATION: Patient was brought to the operating room, placed under general anesthesia. The right side of the scalp was shaved, a roll was placed beneath the right shoulder the patient was gently turned to the left and the patient's head was positioned in a doughnut head rest. The scalp and abdomen were prepped with Betadine soap and solution draped in a sterile fashion. The previous right abdominal incision was used with local site with epinephrine, and reopened. We dissected down to the bone flap, which had been implanted in the abdominal wall. It was freed up from the surrounding tissues and removed. We then proceeded with closure Scarpa's fascia closed with interrupted undyed 0 Vicryl sutures. The skin and subcutaneous tissue were closed with interrupted inverted 2-0 undyed Vicryl sutures. Skin is approximate Dermabond. The scalp incision was infiltrated with local site with epinephrine, and then we began to open the scalp incision from the anterior and posterior limits. We began to dissect over the cranial defect, carefully dissecting down through the skin and subcutaneous tissue. We then began to free up the scalp from the margins of the craniectomy. We reflected the scalp with self retaining retractors. We used 2 square Lorenz cranial plates, they were secured to the bone flap. We made several holes in the bone flap to place dural tack up sutures to the  midportion of the bone flap. And then 2 4-0 Nurolon were placed in the dura, and the sutures brought through the tack up holes within the bone flap. The bone flap was secured to the March the craniectomy using the [facility] cranial plates. And then the tack up sutures were tied up and secured. We then closed the scalp incision with the galea being closed with interrupted inverted 2-0 Vicryl sutures. Skin is approximate surgical staples. The wound was dressed with Adaptic and sterile gauze, and the head was wrapped with 2 Kling and 2 Curlex. Following surgery the patient is to be reversed an anesthetic, extubated, and transferred to the recovery room for further care.  PLAN OF CARE: Admit to inpatient   PATIENT DISPOSITION:  PACU - hemodynamically stable.   Delay start of Pharmacological VTE agent (>24hrs) due to surgical blood loss or risk of bleeding:  yes

## 2012-12-12 NOTE — H&P (Signed)
Subjective: Patient is a 79 y.o. female who is admitted for craniectomy using her cranial bone flap, which was implanted in the abdominal wall. Patient presented with a chronic subdural hematoma in February of this year, she underwent craniotomy for evacuation, but developed recurrent subdural hematoma and required reoperation, at which time the bone flap was implanted in her abdomen. She has recovered well and returns now for cranioplasty.   Patient Active Problem List   Diagnosis Date Noted  . Subdural hemorrhage 08/14/2012  . Subdural hematoma 07/30/2012  . DVT (deep venous thrombosis) 07/04/2012  . CKD (chronic kidney disease) stage 3, GFR 30-59 ml/min 07/04/2012  . Lower extremity edema 07/03/2012  . ARF (acute renal failure) 07/03/2012  . HTN (hypertension) 07/03/2012  . SDH (subdural hematoma) 06/21/2012  . Diabetes mellitus 06/20/2012  . Hyperlipidemia 06/20/2012   Past Medical History  Diagnosis Date  . Diabetes mellitus   . Hypertension   . Hyperlipidemia   . Pneumonia   . Subdural hematoma   . Arthritis   . DVT (deep venous thrombosis)   . Incontinence   . Renal disorder     chronic kidney dz stage III    Past Surgical History  Procedure Laterality Date  . Insertion of vena cava filter    . Craniotomy Right 07/23/2012    Procedure: CRANIOTOMY HEMATOMA EVACUATION SUBDURAL;  Surgeon: [name], MD;  Location: [facility];  Service: Neurosurgery;  Laterality: Right;  . Craniotomy N/A 08/06/2012    Procedure: CRANIOTOMY HEMATOMA EVACUATION SUBDURAL;  Surgeon: [name], MD;  Location: [facility];  Service: Neurosurgery;  Laterality: N/A;  Craniectomy for evacuation of subdural hematoma, implantation of bone flap in abdominal wall   . Brain surgery      No prescriptions prior to admission   No Known Allergies  History  Substance Use Topics  . Smoking status: Never Smoker   . Smokeless tobacco: Never Used  . Alcohol Use: No    Family History   Problem Relation Age of Onset  . Diabetes Mellitus II Son      Review of Systems A comprehensive review of systems was negative.  Objective: Vital signs in last 24 hours:    EXAM: Patient well-developed well-nourished female in no acute distress. Lungs are clear to auscultation , the patient has symmetrical respiratory excursion. Heart has a regular rate and rhythm normal S1 and S2 no murmur.   Abdomen is soft nontender nondistended bowel sounds are present. Extremity examination shows no clubbing cyanosis or edema. Cranial and abdominal systems are both well-healed. Mental status shows the patient awake and alert, following commands, with good speech. Patient is moving all 4 extremities well. She has intact sensation.  Data Review:CBC    Component Value Date/Time   WBC 7.4 12/06/2012 0846   RBC 3.82* 12/06/2012 0846   HGB 10.2* 12/06/2012 0846   HCT 30.1* 12/06/2012 0846   PLT 204 12/06/2012 0846   MCV 78.8 12/06/2012 0846   MCH 26.7 12/06/2012 0846   MCHC 33.9 12/06/2012 0846   RDW 16.5* 12/06/2012 0846   LYMPHSABS 1.0 08/14/2012 0915   MONOABS 1.0 08/14/2012 0915   EOSABS 0.1 08/14/2012 0915   BASOSABS 0.0 08/14/2012 0915                          BMET    Component Value Date/Time   NA 141 12/06/2012 0846   K 3.5 12/06/2012 0846   CL 102  12/06/2012 0846   CO2 29 12/06/2012 0846   GLUCOSE 242* 12/06/2012 0846   BUN 20 12/06/2012 0846   CREATININE 2.46* 12/06/2012 0846   CALCIUM 10.0 12/06/2012 0846   GFRNONAA 16* 12/06/2012 0846   GFRAA 19* 12/06/2012 0846     Assessment/Plan: Patient status post craniectomy with implantation of a bone flap into the right abdominal wall 4 months ago, now returns for cranioplasty. Ashby Dawes the condition in nature the surgical procedure with the patient and her son. We have discussed risks including risks infection, bleeding, possible neurologic dysfunction including paralysis, coma, and death, and anesthetic risks of myocardial infarction, stroke, pneumonia, and death. We  discussed the risk of recurrent intracranial hemorrhage. Understanding all this patient does was to proceed with surgery and is admitted for such.   [name], MD 12/12/2012 7:50 AM

## 2012-12-12 NOTE — Anesthesia Procedure Notes (Signed)
Procedure Name: Intubation Date/Time: 12/12/2012 9:09 AM Performed by: Rogelia Boga Pre-anesthesia Checklist: Patient identified, Emergency Drugs available, Suction available, Patient being monitored and Timeout performed Patient Re-evaluated:Patient Re-evaluated prior to inductionOxygen Delivery Method: Circle system utilized Preoxygenation: Pre-oxygenation with 100% oxygen Intubation Type: IV induction Ventilation: Mask ventilation without difficulty Laryngoscope Size: Mac and 4 Grade View: Grade I Tube type: Oral Number of attempts: 1 Airway Equipment and Method: Stylet Secured at: 20 cm Tube secured with: Tape Dental Injury: Teeth and Oropharynx as per pre-operative assessment

## 2012-12-12 NOTE — Anesthesia Postprocedure Evaluation (Signed)
Anesthesia Post Note  Patient: April [name]  Procedure(s) Performed: Procedure(s) (LRB): CRANIOTOMY BONE FLAP/PROSTHETIC PLATE retrieval of bone from abdomen (N/A)  Anesthesia type: general  Patient location: PACU  Post pain: Pain level controlled  Post assessment: Patient's Cardiovascular Status Stable  Last Vitals:  Filed Vitals:   12/12/12 1156  BP:   Pulse:   Temp: 36.1 C  Resp:     Post vital signs: Reviewed and stable  Level of consciousness: sedated  Complications: No apparent anesthesia complications

## 2012-12-12 NOTE — Anesthesia Preprocedure Evaluation (Signed)
Anesthesia Evaluation  Patient identified by MRN, date of birth, ID band Patient confused    Reviewed: Allergy & Precautions, H&P , NPO status , Patient's Chart, lab work & pertinent test results  Airway Mallampati: II TM Distance: >3 FB Neck ROM: Full    Dental  (+) Poor Dentition   Pulmonary          Cardiovascular hypertension, Pt. on medications and Pt. on home beta blockers     Neuro/Psych    GI/Hepatic   Endo/Other  diabetes, Poorly Controlled, Type 2  Renal/GU CRFRenal disease     Musculoskeletal   Abdominal   Peds  Hematology   Anesthesia Other Findings   Reproductive/Obstetrics                           Anesthesia Physical Anesthesia Plan  ASA: III  Anesthesia Plan: General   Post-op Pain Management:    Induction: Intravenous  Airway Management Planned: Oral ETT  Additional Equipment:   Intra-op Plan:   Post-operative Plan: Extubation in OR and Possible Post-op intubation/ventilation  Informed Consent: I have reviewed the patients History and Physical, chart, labs and discussed the procedure including the risks, benefits and alternatives for the proposed anesthesia with the patient or authorized representative who has indicated his/her understanding and acceptance.   Dental advisory given  Plan Discussed with: CRNA, Anesthesiologist and Surgeon  Anesthesia Plan Comments:         Anesthesia Quick Evaluation

## 2012-12-12 NOTE — Preoperative (Signed)
Beta Blockers   Reason not to administer Beta Blockers:Not Applicable, Pt took Metoprolol at 0700 today

## 2012-12-12 NOTE — Progress Notes (Signed)
UR completed 

## 2012-12-12 NOTE — OR Nursing (Signed)
Closed abdomen, and started the incision on the head at 1003.

## 2012-12-13 ENCOUNTER — Inpatient Hospital Stay (HOSPITAL_COMMUNITY): Payer: Medicare Other

## 2012-12-13 ENCOUNTER — Encounter (HOSPITAL_COMMUNITY): Payer: Self-pay | Admitting: Neurosurgery

## 2012-12-13 LAB — URINALYSIS, ROUTINE W REFLEX MICROSCOPIC
Bilirubin Urine: NEGATIVE
Glucose, UA: 250 mg/dL — AB
Hgb urine dipstick: NEGATIVE
Ketones, ur: NEGATIVE mg/dL
Leukocytes, UA: NEGATIVE
Nitrite: NEGATIVE
Protein, ur: >300 mg/dL — AB
Specific Gravity, Urine: 1.023 (ref 1.005–1.030)
Urobilinogen, UA: 0.2 mg/dL (ref 0.0–1.0)
pH: 6 (ref 5.0–8.0)

## 2012-12-13 LAB — BASIC METABOLIC PANEL
BUN: 29 mg/dL — ABNORMAL HIGH (ref 6–23)
CO2: 26 mEq/L (ref 19–32)
Calcium: 8 mg/dL — ABNORMAL LOW (ref 8.4–10.5)
Chloride: 103 mEq/L (ref 96–112)
Creatinine, Ser: 2.63 mg/dL — ABNORMAL HIGH (ref 0.50–1.10)
GFR calc Af Amer: 18 mL/min — ABNORMAL LOW (ref >90)
GFR calc non Af Amer: 15 mL/min — ABNORMAL LOW (ref >90)
Glucose, Bld: 252 mg/dL — ABNORMAL HIGH (ref 70–99)
Potassium: 4 mEq/L (ref 3.5–5.1)
Sodium: 137 mEq/L (ref 135–145)

## 2012-12-13 LAB — URINE MICROSCOPIC-ADD ON

## 2012-12-13 MED ORDER — HYDRALAZINE HCL 20 MG/ML IJ SOLN: 10.0000 mg | Freq: Three times a day (TID) | INTRAMUSCULAR | Status: DC | PRN

## 2012-12-13 NOTE — Progress Notes (Signed)
By 1600 pt has not voided since foley removal at 0700.  Bladder scan revealed 86mL of urine.  Walked pt to the toilet and she was unable to void.  Will notify MD.

## 2012-12-13 NOTE — Progress Notes (Signed)
Subjective: Patient resting in bed comfortably. Foley DC'd at about 0600.  Objective: Vital signs in last 24 hours: Filed Vitals:   12/13/12 0500 12/13/12 0600 12/13/12 0700 12/13/12 0755  BP: 146/55 170/64    Pulse: 58 59 64   Temp:    98.3 F (36.8 C)  TempSrc:    Oral  Resp: 11 16 16    Height:      Weight:      SpO2: 100% 99% 99%     Intake/Output from previous day: 07/09 0701 - 07/10 0700 In: 2932 [P.O.:420; I.V.:2137] Out: 442 [Urine:417; Blood:25] Intake/Output this shift:    Physical Exam:  Patient lethargic but arousable. Following commands, and moving all 4 extremities, although she does move the left upper extremity least it has a tendency to neglect it.   Assessment/Plan: We'll check CT brain without contrast this morning to ensure no intracranial hemorrhage following cranioplasty.   [name], MD 12/13/2012, 8:10 AM

## 2012-12-13 NOTE — Progress Notes (Signed)
0145: Bladder scanned pt at 0140 and not urine found on scan. Called Dr. Yetta Barre to notify of urine output of 70 cc since beginning of shift at 1900 and to notify that pt has become more difficult to arouse. No orders were given at this time. Will continue to monitor.

## 2012-12-14 LAB — URINE CULTURE
Colony Count: NO GROWTH
Culture: NO GROWTH

## 2012-12-14 NOTE — Plan of Care (Signed)
Problem: Phase II Progression Outcomes Goal: Discharge plan established Outcome: Progressing Plan to discharge back to son's home; must increase activity tolerance and demonstrate satisfactory voiding prior to discharge.

## 2012-12-14 NOTE — Progress Notes (Signed)
Pt has not voided since foley removal at 2300 except for scant spot on bed pad. 0545 bladder scanned pt. Scan revealed 236 ml. Pt lethargic and difficult to awaken, not following commands or speaking. Dr. Newell Coral notified at 0600. No orders at this time except to continue to monitor.

## 2012-12-14 NOTE — Plan of Care (Signed)
Problem: Phase II Progression Outcomes Goal: Progress activity as tolerated unless otherwise ordered Outcome: Progressing 12/14/12 ambulated in room x 2, up in chair x 5 hours, walked to bathroom x2.  Still unable to void.

## 2012-12-14 NOTE — Progress Notes (Signed)
Inpatient Diabetes Program Recommendations  AACE/ADA: New Consensus Statement on Inpatient Glycemic Control (2013)  Target Ranges:  Prepandial:   less than 140 mg/dL      Peak postprandial:   less than 180 mg/dL (1-2 hours)      Critically ill patients:  140 - 180 mg/dL   Pt with DM and lab glucose in mid 200's.  Inpatient Diabetes Program Recommendations Correction (SSI): Please check cbg's tidwc and hs.  Use moderate correction tidwc  Thank you, Lenor Coffin, RN, CNS, Diabetes Coordinator [phone])

## 2012-12-14 NOTE — Progress Notes (Signed)
Subjective: Patient resting in bed comfortably. Didn't void after Foley removed yesterday, bladder scan showed low volumes, but because of concern of extremely large-volume, we placed a Foley catheter which drained 175 cc. UA showed 3-6 WBC, urine culture pending. BMET showed stable electrolytes and renal insufficiency. CT brain without contrast yesterday showed thickening of the peridural tissues in the area of the previous cranial defect with mild mass effect intracranially now that the bone flap has been replaced into the skull; no evidence of intracranial hemorrhage.  Objective: Vital signs in last 24 hours: Filed Vitals:   12/14/12 0400 12/14/12 0600 12/14/12 0700 12/14/12 0800  BP: 164/64 163/64  171/64  Pulse: 56 58 55 58  Temp: 97.6 F (36.4 C)     TempSrc: Oral     Resp: 17 13 15 17   Height:      Weight:      SpO2: 99% 100% 99% 100%    Intake/Output from previous day: 07/10 0701 - 07/11 0700 In: 1170 [P.O.:420; I.V.:750] Out: 210 [Urine:210] Intake/Output this shift:    Physical Exam:  Awake and alert, oriented to name, and at times the [facility]. Follows commands. Moving all 4 extremities. Dressing removed, wound clean and dry.  BMET  Recent Labs  12/13/12 2135  NA 137  K 4.0  CL 103  CO2 26  GLUCOSE 252*  BUN 29*  CREATININE 2.63*  CALCIUM 8.0*   ABG    Component Value Date/Time   PHART 7.337* 08/06/2012 2100   PCO2ART 42.8 08/06/2012 2100   PO2ART 120.0* 08/06/2012 2100   HCO3 22.3 08/06/2012 2100   TCO2 23.6 08/06/2012 2100   ACIDBASEDEF 2.6* 08/06/2012 2100   O2SAT 99.3 08/06/2012 2100    Studies/Results: Ct Head Wo Contrast  12/13/2012   *RADIOLOGY REPORT*  Clinical Data: Follow up cranioplasty  CT HEAD WITHOUT CONTRAST  Technique:  Contiguous axial images were obtained from the base of the skull through the vertex without contrast.  Comparison: 11/12/2012  Findings: Interval cranioplasty on the right.  The bone flap has been replaced and is in good  position.  There is extensive dural calcification which has a convex contour with flattening of the cerebral cortex on the right.  There is fluid and gas in the extradural space on the right compatible with recent surgery.  No acute hemorrhage  Generalized atrophy.  Moderate chronic microvascular ischemic changes.  No acute infarct or mass.  IMPRESSION: Interval right frontal parietal cranioplasty.  No acute hemorrhage.  Atrophy and chronic microvascular ischemia.  No acute infarct.   Original Report Authenticated By: Janeece Riggers, M.D.    Assessment/Plan: Stable following cranioplasty, will need to continue to monitor voiding function. Encourage patient and staff to increase ambulation and activity.  I will be out of town until Tuesday, July 15, Dr. Marikay Alar will cover until I return.   [name], MD 12/14/2012, 8:11 AM

## 2012-12-14 NOTE — Plan of Care (Signed)
Problem: Phase II Progression Outcomes Goal: Hemodynamically stable Outcome: Completed/Met Date Met:  12/14/12 VSS at baseline Goal: Discharge plan established Outcome: Not Progressing Plan for return to living at son's home.  Needs to demonstrate satisfactory voiding & activity tolerance pre-discharge.

## 2012-12-15 LAB — GLUCOSE, CAPILLARY: Glucose-Capillary: 324 mg/dL — ABNORMAL HIGH (ref 70–99)

## 2012-12-15 MED ORDER — INSULIN ASPART 100 UNIT/ML ~~LOC~~ SOLN: 2.0000 [IU] | SUBCUTANEOUS | Status: DC

## 2012-12-15 MED ORDER — INSULIN ASPART 100 UNIT/ML ~~LOC~~ SOLN
0.0000 [IU] | Freq: Three times a day (TID) | SUBCUTANEOUS | Status: DC
  Administered 2012-12-16: 7 [IU] via SUBCUTANEOUS
  Administered 2012-12-16: 2 [IU] via SUBCUTANEOUS
  Administered 2012-12-17: 5 [IU] via SUBCUTANEOUS
  Administered 2012-12-17: 3 [IU] via SUBCUTANEOUS
  Administered 2012-12-17 – 2012-12-18 (×4): 5 [IU] via SUBCUTANEOUS
  Administered 2012-12-19: 9 [IU] via SUBCUTANEOUS

## 2012-12-15 NOTE — Progress Notes (Signed)
Subjective: Patient reports She's doing well still has a headache and complaining of nausea so she feels "sick" however the nurse reports no evidence of nausea she is tolerating regular diet and getting out of bed well. To me she was urinary retention we have asked replacement Foley. Objective: Vital signs in last 24 hours: Temp:  [97.3 F (36.3 C)-97.8 F (36.6 C)] 97.5 F (36.4 C) (07/12 0346) Pulse Rate:  [56-83] 57 (07/12 0600) Resp:  [8-22] 20 (07/12 0600) BP: (132-186)/(55-79) 161/64 mmHg (07/12 0600) SpO2:  [97 %-100 %] 99 % (07/12 0600)  Intake/Output from previous day: 07/11 0701 - 07/12 0700 In: 540 [P.O.:540] Out: 1350 [Urine:1350] Intake/Output this shift:    Awake alert oriented strength 5 out of 5 wound clean and dry will transfer the floor  Lab Results: No results found for this basename: WBC, HGB, HCT, PLT,  in the last 72 hours BMET  Recent Labs  12/13/12 2135  NA 137  K 4.0  CL 103  CO2 26  GLUCOSE 252*  BUN 29*  CREATININE 2.63*  CALCIUM 8.0*    Studies/Results: Ct Head Wo Contrast  12/13/2012   *RADIOLOGY REPORT*  Clinical Data: Follow up cranioplasty  CT HEAD WITHOUT CONTRAST  Technique:  Contiguous axial images were obtained from the base of the skull through the vertex without contrast.  Comparison: 11/12/2012  Findings: Interval cranioplasty on the right.  The bone flap has been replaced and is in good position.  There is extensive dural calcification which has a convex contour with flattening of the cerebral cortex on the right.  There is fluid and gas in the extradural space on the right compatible with recent surgery.  No acute hemorrhage  Generalized atrophy.  Moderate chronic microvascular ischemic changes.  No acute infarct or mass.  IMPRESSION: Interval right frontal parietal cranioplasty.  No acute hemorrhage.  Atrophy and chronic microvascular ischemia.  No acute infarct.   Original Report Authenticated By: Janeece Riggers, M.D.     Assessment/Plan: Transferred to floor continue rehabilitation  LOS: 3 days     Laprecious Austill P 12/15/2012, 7:46 AM

## 2012-12-16 LAB — GLUCOSE, CAPILLARY
Glucose-Capillary: 314 mg/dL — ABNORMAL HIGH (ref 70–99)
Glucose-Capillary: 162 mg/dL — ABNORMAL HIGH (ref 70–99)
Glucose-Capillary: 94 mg/dL (ref 70–99)
Glucose-Capillary: 231 mg/dL — ABNORMAL HIGH (ref 70–99)

## 2012-12-17 LAB — GLUCOSE, CAPILLARY
Glucose-Capillary: 298 mg/dL — ABNORMAL HIGH (ref 70–99)
Glucose-Capillary: 241 mg/dL — ABNORMAL HIGH (ref 70–99)
Glucose-Capillary: 251 mg/dL — ABNORMAL HIGH (ref 70–99)
Glucose-Capillary: 307 mg/dL — ABNORMAL HIGH (ref 70–99)

## 2012-12-17 MED ORDER — POLYETHYLENE GLYCOL 3350 17 G PO PACK
17.0000 g | PACK | Freq: Every day | ORAL | Status: DC
  Administered 2012-12-17 – 2012-12-21 (×5): 17 g via ORAL
  Filled 2012-12-17 (×5): qty 1

## 2012-12-17 NOTE — Progress Notes (Signed)
Subjective: Patient reports She's doing great today feels much better less headache less nausea  Objective: Vital signs in last 24 hours: Temp:  [97.3 F (36.3 C)-98 F (36.7 C)] 97.3 F (36.3 C) (07/14 1300) Pulse Rate:  [63-70] 67 (07/14 1300) Resp:  [16-18] 16 (07/14 1300) BP: (109-189)/(48-84) 109/48 mmHg (07/14 1300) SpO2:  [98 %-100 %] 100 % (07/14 1300)  Intake/Output from previous day: 07/13 0701 - 07/14 0700 In: 390 [P.O.:390] Out: -  Intake/Output this shift: Total I/O In: 240 [P.O.:240] Out: 1200 [Urine:1200]  Sit up in a chair awake alert oriented x4 strength 5 out of 5 wound clean and dry  Lab Results: No results found for this basename: WBC, HGB, HCT, PLT,  in the last 72 hours BMET No results found for this basename: NA, K, CL, CO2, GLUCOSE, BUN, CREATININE, CALCIUM,  in the last 72 hours  Studies/Results: No results found.  Assessment/Plan: Postop Cranioplasty doing very well significant improvement from over the weekend continue to mobilize we'll take out her Foley, starting to think about discharged planning  LOS: 5 days     Anvith Mauriello P 12/17/2012, 4:34 PM

## 2012-12-17 NOTE — Progress Notes (Signed)
Patient became very anxious at 0430. Patient's orientation remains WNL.Pt states "I need all my children need to be here right this mintue. They need to know what is going on with me." Patient asked appropriate questions regarding surgery and healing process. Son [name] called x2 with no answer. Will continue to monitor. Herma Ard RN

## 2012-12-17 NOTE — Progress Notes (Signed)
Inpatient Diabetes Program Recommendations  AACE/ADA: New Consensus Statement on Inpatient Glycemic Control (2013)  Target Ranges:  Prepandial:   less than 140 mg/dL      Peak postprandial:   less than 180 mg/dL (1-2 hours)      Critically ill patients:  140 - 180 mg/dL  Results for April [name], April [name] (MRN [id number]) as of 12/17/2012 15:25  Ref. Range 12/16/2012 11:54 12/16/2012 16:32 12/16/2012 21:44 12/17/2012 06:37 12/17/2012 11:38  Glucose-Capillary Latest Range: 70-99 mg/dL 914 (H) 94 782 (H) 956 (H) 241 (H)   Inpatient Diabetes Program Recommendations Insulin - Basal: consider adding low dose basal Lantus or Levemir if CBGs continue >200 Correction (SSI): Marland Kitchen Thank you  Piedad Climes BSN, RN,CDE Inpatient Diabetes Coordinator [phone] (team pager)

## 2012-12-18 LAB — GLUCOSE, CAPILLARY
Glucose-Capillary: 289 mg/dL — ABNORMAL HIGH (ref 70–99)
Glucose-Capillary: 291 mg/dL — ABNORMAL HIGH (ref 70–99)
Glucose-Capillary: 273 mg/dL — ABNORMAL HIGH (ref 70–99)
Glucose-Capillary: 314 mg/dL — ABNORMAL HIGH (ref 70–99)

## 2012-12-18 MED ORDER — ACETAMINOPHEN 325 MG PO TABS
325.0000 mg | ORAL_TABLET | ORAL | Status: DC | PRN
  Administered 2012-12-19: 650 mg via ORAL
  Filled 2012-12-18: qty 2

## 2012-12-18 NOTE — Progress Notes (Signed)
Staples removal- prepped skin with alcohol before and after the procedure.  Removed every other staple from the right side of the patient's head.  She tolerated the procedure well.  Left message on her son's cell phone regarding impending dc tomorrow.  Will continue to monitor.  Lance Bosch, RN

## 2012-12-18 NOTE — Progress Notes (Signed)
Inpatient Diabetes Program Recommendations  AACE/ADA: New Consensus Statement on Inpatient Glycemic Control (2013)  Target Ranges:  Prepandial:   less than 140 mg/dL      Peak postprandial:   less than 180 mg/dL (1-2 hours)      Critically ill patients:  140 - 180 mg/dL  Results for MARIABELLA, NILSEN (MRN [id number]) as of 12/18/2012 15:11  Ref. Range 12/17/2012 11:38 12/17/2012 16:50 12/17/2012 21:33 12/18/2012 06:49 12/18/2012 11:16  Glucose-Capillary Latest Range: 70-99 mg/dL 409 (H) 811 (H) 914 (H) 289 (H) 291 (H)   Inpatient Diabetes Program Recommendations Insulin - Basal: consider adding low dose basal Lantus or Levemir if CBGs continue >200 Correction (SSI): Marland Kitchen Thank you  Piedad Climes BSN, RN,CDE Inpatient Diabetes Coordinator [phone] (team pager)

## 2012-12-18 NOTE — Progress Notes (Signed)
Filed Vitals:   12/17/12 2119 12/18/12 0141 12/18/12 0528 12/18/12 0732  BP: 160/72 168/66 178/65 175/68  Pulse: 74 71 66 64  Temp: 98.2 F (36.8 C) 98.2 F (36.8 C) 98.3 F (36.8 C) 97.7 F (36.5 C)  TempSrc: Oral Oral Oral Oral  Resp: 16 18 18 16   Height:      Weight:      SpO2: 100% 100% 100% 97%    Patient sitting up in chair. Has done activities of daily living with assistance of nursing staff this morning. Wound healing well. We'll have nursing staff removed half the staples today, and remaining staples tomorrow. Taking well by mouth.  Plan: We'll DC IV. Have encouraged increased ambulation.  [name], MD 12/18/2012, 8:35 AM

## 2012-12-19 DIAGNOSIS — E785 Hyperlipidemia, unspecified: Secondary | ICD-10-CM

## 2012-12-19 LAB — GLUCOSE, CAPILLARY
Glucose-Capillary: 109 mg/dL — ABNORMAL HIGH (ref 70–99)
Glucose-Capillary: 129 mg/dL — ABNORMAL HIGH (ref 70–99)
Glucose-Capillary: 132 mg/dL — ABNORMAL HIGH (ref 70–99)
Glucose-Capillary: 232 mg/dL — ABNORMAL HIGH (ref 70–99)
Glucose-Capillary: 325 mg/dL — ABNORMAL HIGH (ref 70–99)
Glucose-Capillary: 420 mg/dL — ABNORMAL HIGH (ref 70–99)
Glucose-Capillary: 540 mg/dL — ABNORMAL HIGH (ref 70–99)
Glucose-Capillary: 90 mg/dL (ref 70–99)

## 2012-12-19 LAB — BASIC METABOLIC PANEL
CO2: 25 mEq/L (ref 19–32)
Chloride: 106 mEq/L (ref 96–112)
Sodium: 137 mEq/L (ref 135–145)

## 2012-12-19 LAB — GLUCOSE, RANDOM: Glucose, Bld: 412 mg/dL — ABNORMAL HIGH (ref 70–99)

## 2012-12-19 MED ORDER — INSULIN ASPART 100 UNIT/ML ~~LOC~~ SOLN
0.0000 [IU] | SUBCUTANEOUS | Status: DC
  Administered 2012-12-19: 2 [IU] via SUBCUTANEOUS
  Administered 2012-12-19: 5 [IU] via SUBCUTANEOUS
  Administered 2012-12-19: 2 [IU] via SUBCUTANEOUS
  Administered 2012-12-20: 5 [IU] via SUBCUTANEOUS
  Administered 2012-12-20: 2 [IU] via SUBCUTANEOUS

## 2012-12-19 MED ORDER — INSULIN GLARGINE 100 UNIT/ML ~~LOC~~ SOLN
10.0000 [IU] | Freq: Every day | SUBCUTANEOUS | Status: DC
  Administered 2012-12-19: 10 [IU] via SUBCUTANEOUS
  Filled 2012-12-19: qty 0.1

## 2012-12-19 MED ORDER — INSULIN ASPART 100 UNIT/ML ~~LOC~~ SOLN
15.0000 [IU] | Freq: Once | SUBCUTANEOUS | Status: AC
  Administered 2012-12-19: 15 [IU] via SUBCUTANEOUS

## 2012-12-19 NOTE — Consult Note (Addendum)
[facility] Medical Consultation  Jerline Linzy RUE:[id number] DOB: 04-10-1922 DOA: 12/12/2012 PCP: Standley Dakins, MD   Requesting physician:Dr Shirlean Kelly (Neurosurgeon) Date of consultation: 12/19/2012 Reason for consultation: DM (uncontrolled)  Impression/Recommendations Active Problems:   * No active hospital problems. *    1. DM; patient's chart shows that appears she was only on Januvia for home medication. Secondary to contusion of patient unable to ascertain if patient was on any additional medication. Patient AG within normal limits. Start patient on SSI moderate scale and Lantus 10 units. Have left instructions with patient's RN that when son comes to visit please page me in order to discuss plan of care. Note patient's last hemoglobin A1c= 9.9 in 11/26/2012  2. Hyperlipidemia; patient not within ADA guidelines for cholesterol control (specifically high TG) will start on niacin after patient is allowed to start aspirin per neurosurgery .    I will followup again tomorrow. Please contact me if I can be of assistance in the meanwhile. Thank you for this consultation.  Chief Complaint: Uncontrolled DM  HPI:  79 y.o.BF PMHx DM (uncontrolled), HTN, HLD, SDH, Arthritis, DVT (deep venous thrombosis),   Incontinence, CKD stage III . Pt was admitted 12/12/2012 by Dr Shirlean Kelly  (Neurosurgery) for craniectomy using her cranial bone flap, which was implanted in the abdominal wall. Patient presented with a chronic subdural hematoma in February of this year, she underwent craniotomy for evacuation, but developed recurrent subdural hematoma and required reoperation, at which time the bone flap was implanted in her abdomen. She has recovered well and returns now for cranioplasty. Consulted this a.m. for help controlling patient's DM, and insuring she has a PCP prior to discharge. AG=8 , CBG at 0835= 412mg /dL appears patient's only DM medication at home is Januvia.   HgA1C  11/26/2012 = 9.9 Lipid Panel 11/26/2012 Cholesterol=171mg /dL HDL= 46 mg/dL LDL= 77mg /dL JY=782 mg/dL     Past Medical History  Diagnosis Date  . Diabetes mellitus   . Hypertension   . Hyperlipidemia   . Pneumonia   . Subdural hematoma   . Arthritis   . DVT (deep venous thrombosis)   . Incontinence   . Renal disorder     chronic kidney dz stage III   Past Surgical History  Procedure Laterality Date  . Insertion of vena cava filter    . Craniotomy Right 07/23/2012    Procedure: CRANIOTOMY HEMATOMA EVACUATION SUBDURAL;  Surgeon: [name], MD;  Location: [facility];  Service: Neurosurgery;  Laterality: Right;  . Craniotomy N/A 08/06/2012    Procedure: CRANIOTOMY HEMATOMA EVACUATION SUBDURAL;  Surgeon: [name], MD;  Location: [facility];  Service: Neurosurgery;  Laterality: N/A;  Craniectomy for evacuation of subdural hematoma, implantation of bone flap in abdominal wall   . Brain surgery    . Craniotomy N/A 12/12/2012    Procedure: CRANIOTOMY BONE FLAP/PROSTHETIC PLATE retrieval of bone from abdomen;  Surgeon: [name], MD;  Location: [facility];  Service: Neurosurgery;  Laterality: N/A;  cranioplasty with retrieval of bone flap from abdominal wall pocket. Site is head and abdomen.   Social History:  reports that she has never smoked. She has never used smokeless tobacco. She reports that she does not drink alcohol or use illicit drugs.  No Known Allergies Family History  Problem Relation Age of Onset  . Diabetes Mellitus II Son     Prior to Admission medications   Medication Sig Start Date End Date Taking? Authorizing Provider  amLODipine (  NORVASC) 10 MG tablet Take 1 tablet (10 mg total) by mouth daily. 08/17/12  Yes Daniel J Angiulli, PA-C  atorvastatin (LIPITOR) 40 MG tablet Take 1 tablet (40 mg total) by mouth every evening. 08/17/12  Yes Daniel J Angiulli, PA-C  cloNIDine (CATAPRES) 0.1 MG tablet Take 3 tablets (0.3 mg total) by mouth 3  (three) times daily. 11/26/12  Yes Alison Murray, MD  fish oil-omega-3 fatty acids 1000 MG capsule Take 2 capsules (2 g total) by mouth daily. 11/27/12  Yes Alison Murray, MD  irbesartan (AVAPRO) 300 MG tablet Take 1 tablet (300 mg total) by mouth at bedtime. 11/08/12  Yes Reuben Likes, MD  levETIRAcetam (KEPPRA) 500 MG tablet Take 1 tablet (500 mg total) by mouth 2 (two) times daily. 08/17/12  Yes Daniel J Angiulli, PA-C  metoprolol tartrate (LOPRESSOR) 12.5 mg TABS Take 6.25 mg by mouth 2 (two) times daily.   Yes Historical Provider, MD  Multiple Vitamin (MULTIVITAMIN WITH MINERALS) TABS Take 1 tablet by mouth daily. 08/17/12  Yes Daniel J Angiulli, PA-C  polyethylene glycol powder (MIRALAX) powder Take 17 g by mouth daily. 11/08/12  Yes Reuben Likes, MD  sitaGLIPtin (JANUVIA) 50 MG tablet Take 1 tablet (50 mg total) by mouth daily. 08/17/12  Yes Mcarthur Rossetti Angiulli, PA-C   Physical Exam: Blood pressure 166/60, pulse 78, temperature 98.3 F (36.8 C), temperature source Oral, resp. rate 18, height 5\' 2"  (1.575 m), weight 48.3 kg (106 lb 7.7 oz), SpO2 98.00%. Filed Vitals:   12/18/12 1850 12/18/12 2146 12/19/12 0139 12/19/12 0601  BP: 153/59 166/64 178/64 166/60  Pulse: 80 74 75 78  Temp: 96.1 F (35.6 C) 97.7 F (36.5 C) 98.1 F (36.7 C) 98.3 F (36.8 C)  TempSrc: Axillary Oral Oral Oral  Resp: 20 18 18 18   Height:      Weight:      SpO2: 100% 98% 97% 98%     General:  Alert,NAD  Cardiovascular: Regular rhythm and rate, negative murmurs rubs or gallops, DP/PT pulse one plus bilateral  Respiratory: Clear to auscultation bilaterally  Abdomen: Obese, soft, plus bowel sounds  Skin: Healing suture line on patient's right lateral aspect of her scalp with staples present.  Neurologic: Alert/O x 1 ( unsure of why, where, when)  Labs on Admission:  Basic Metabolic Panel:  Recent Labs Lab 12/13/12 2135  NA 137  K 4.0  CL 103  CO2 26  GLUCOSE 252*  BUN 29*  CREATININE 2.63*   CALCIUM 8.0*   Liver Function Tests: No results found for this basename: AST, ALT, ALKPHOS, BILITOT, PROT, ALBUMIN,  in the last 168 hours No results found for this basename: LIPASE, AMYLASE,  in the last 168 hours No results found for this basename: AMMONIA,  in the last 168 hours CBC: No results found for this basename: WBC, NEUTROABS, HGB, HCT, MCV, PLT,  in the last 168 hours Cardiac Enzymes: No results found for this basename: CKTOTAL, CKMB, CKMBINDEX, TROPONINI,  in the last 168 hours BNP: No components found with this basename: POCBNP,  CBG:  Recent Labs Lab 12/18/12 1116 12/18/12 1635 12/18/12 2148 12/19/12 0645 12/19/12 0801  GLUCAP 291* 273* 314* 540* 420*    Radiological Exams on Admission: No results found.   Time spent: 30 minutes  Drema Dallas [facility] Pager [phone]  If 7PM-7AM, please contact night-coverage [email or web address] Password [email or web address] 12/19/2012, 10:14 AM

## 2012-12-19 NOTE — Progress Notes (Signed)
Remainder of staples removed, patient tolerated procedure well.  April [name] [employer]

## 2012-12-19 NOTE — Progress Notes (Signed)
Night shift gave 15 units of novolog for cbg >500.  CBG now 420.  Dr. Wynetta Emery ordered insulin drip.  Lance Bosch, RN

## 2012-12-19 NOTE — Progress Notes (Signed)
Called patient's son [name].  Let him know that she would not be discharged today and that drs were working to lower her blood sugar.  He understood and said he would be up to visit later.  Lance Bosch, RN

## 2012-12-19 NOTE — Progress Notes (Signed)
Dr. Newell Coral on the floor.  He has consulted the hospitalists regarding management of her blood sugar.  He instructed me to give her 9 units of insulin now and then when the hospitalists comes up to do a CBG to see what her sugar is and then have them to manage from there.  Will continue to monitor. Lance Bosch, RN

## 2012-12-19 NOTE — Care Management Note (Unsigned)
    Page 1 of 2   12/20/2012     4:04:33 PM   CARE MANAGEMENT NOTE 12/20/2012  Patient:  April [name], April [name]   Account Number:  [id number]  Date Initiated:  12/17/2012  Documentation initiated by:  Jiles Crocker  Subjective/Objective Assessment:   ADMITTED FOR SURGERY CRANIOTOMY BONE FLAP/PROSTHETIC PLATE retrieval of bone from abdomen     Action/Plan:   LIVES WITH FAMILY MEMBERS; CM FOLLOWING FOR DCP   Anticipated DC Date:  12/24/2012   Anticipated DC Plan:  HOME W HOME HEALTH SERVICES      DC Planning Services  CM consult      Choice offered to / List presented to:  C-4 Adult Children        HH arranged  HH-1 RN      [facility] agency  [facility]   Status of service:  Completed, signed off Medicare Important Message given?  NA - LOS <3 / Initial given by admissions (If response is "NO", the following Medicare IM given date fields will be blank) Date Medicare IM given:   Date Additional Medicare IM given:    Discharge Disposition:    Per UR Regulation:  Reviewed for med. necessity/level of care/duration of stay  If discussed at Long Length of Stay Meetings, dates discussed:    Comments:  12/20/12 1330 Elmer Bales RN, MSN, CM- Spoke with patient and son [name] regarding follow-up appointment, diabetic education, and home health RN for diabetic management.  Pt's son is agreeable to all and has chosen [facility], which they have used in the past.  CM stressed the importance of maintaining all follow-up appointments.  12/20/12 1045 Elmer Bales RN, MSN, CM-  Spoke with Dr Newell Coral and Dr Janee Morn to notify that patient has a previously established appointment with the [facility] for 12/31/12 at 1000.  She was seen on 11/26/12 at the clinic for establishment of care. Both physicians are fine with patient keeping her appointment and established care at the clinic. Appointment information was entered into patient's AVS.   12/19/12 1215  Elmer Bales RN, MSN CM- Per patient's son [name], patient's PCP is Dr Laural Benes, as listed in her progress notes.   12/19/12 1020 Courtney Clearence Cheek RN, MSN, CM- Spoke with patient to verify her PCP, which is listed in the chart as Dr Laural Benes.  Pt was unable to provide the information and asked that CM contact her son [name].  Per patient's RN who had just spoken with [name], he will be up to visit patient today.  CM asked patient's RN to notify when son has arrived.  Dr Janee Morn is aware.    12/17/2012- B CHANDLER RN,BSN,MHA

## 2012-12-19 NOTE — Progress Notes (Signed)
Filed Vitals:   12/18/12 2146 12/19/12 0139 12/19/12 0601 12/19/12 1118  BP: 166/64 178/64 166/60 145/65  Pulse: 74 75 78 75  Temp: 97.7 F (36.5 C) 98.1 F (36.7 C) 98.3 F (36.8 C) 98.1 F (36.7 C)  TempSrc: Oral Oral Oral Oral  Resp: 18 18 18 18   Height:      Weight:      SpO2: 98% 97% 98% 100%    BMET  Recent Labs  12/19/12 0835  GLUCOSE 412*    Patient without new complaints, but Accu-Chek this morning was 540. Patient given 15 units of NovoLog subcutaneous, given additional 9 units subcutaneous about an hour and a half later, and blood sugars have steadily improved. Nursing staff reports that the patient's had a rather voraceous appetite, which probably contributes significantly to her marked hyperglycemia.  Have asked [facility] service for medicine consultation for assistance and developing a adequate diabetes management regimen that she will be able to transition to home with. The patient herself does have some mild dementia, and I feel that it'll probably be best for her to be on an oral regimen. I've also spoken with the nursing staff about having me social worker ensure that there is an adequate primary care followup, following discharge.  Plan: Has recovered well from cranioplasty, working on stabilizing medical condition prior to discharge.  [name], MD 12/19/2012, 2:06 PM

## 2012-12-20 DIAGNOSIS — E119 Type 2 diabetes mellitus without complications: Secondary | ICD-10-CM

## 2012-12-20 DIAGNOSIS — I6200 Nontraumatic subdural hemorrhage, unspecified: Secondary | ICD-10-CM

## 2012-12-20 DIAGNOSIS — I10 Essential (primary) hypertension: Secondary | ICD-10-CM

## 2012-12-20 LAB — GLUCOSE, CAPILLARY
Glucose-Capillary: 66 mg/dL — ABNORMAL LOW (ref 70–99)
Glucose-Capillary: 98 mg/dL (ref 70–99)
Glucose-Capillary: 126 mg/dL — ABNORMAL HIGH (ref 70–99)
Glucose-Capillary: 225 mg/dL — ABNORMAL HIGH (ref 70–99)
Glucose-Capillary: 322 mg/dL — ABNORMAL HIGH (ref 70–99)
Glucose-Capillary: 278 mg/dL — ABNORMAL HIGH (ref 70–99)

## 2012-12-20 MED ORDER — INSULIN GLARGINE 100 UNIT/ML ~~LOC~~ SOLN
5.0000 [IU] | Freq: Every day | SUBCUTANEOUS | Status: DC
  Administered 2012-12-20: 5 [IU] via SUBCUTANEOUS
  Filled 2012-12-20: qty 0.05

## 2012-12-20 MED ORDER — INSULIN ASPART 100 UNIT/ML ~~LOC~~ SOLN
0.0000 [IU] | Freq: Three times a day (TID) | SUBCUTANEOUS | Status: DC
  Administered 2012-12-20: 5 [IU] via SUBCUTANEOUS
  Administered 2012-12-21: 7 [IU] via SUBCUTANEOUS

## 2012-12-20 NOTE — Progress Notes (Signed)
It was noted that the patient had not voided for the entire first shift. Evea RN stated the patient had no complaints and abdomen was soft. After 3 attempts between 7pm-9pm with no success in voiding I bladder scanned the patient and it showed 270 mls. Paged on-call neurosurgery Dr. Wynetta Emery and orders to place foley and monitor. Interventions in place at this time.

## 2012-12-20 NOTE — Progress Notes (Signed)
4am blood sugar 66. Pt asymptomatic. [place] sandwich with OJ given. Will recheck BS in a hr.

## 2012-12-20 NOTE — Progress Notes (Signed)
Filed Vitals:   12/19/12 2249 12/20/12 0213 12/20/12 0547 12/20/12 1022  BP: 159/64 156/62 164/73 174/71  Pulse: 67 63 69 80  Temp: 97.4 F (36.3 C) 97.2 F (36.2 C) 97.3 F (36.3 C) 97.2 F (36.2 C)  TempSrc: Oral Oral Oral Oral  Resp:      Height:      Weight:      SpO2: 100% 100% 98% 98%   BMET  Recent Labs  12/19/12 0835 12/19/12 2232  NA  --  137  K  --  3.7  CL  --  106  CO2  --  25  GLUCOSE 412* 131*  BUN  --  42*  CREATININE  --  2.62*  CALCIUM  --  8.7    Patient in bed, without complaints. Blood sugar better controlled. Scalp and abdominal incision both well-healed. Case discussed with Dr. Ramiro Harvest from [facility] who believes that diabetic management regimen will be stabilized by tomorrow. Continue current supportive care.  Plan: Continue supportive care, appreciate the assistance of [facility].  [name], MD 12/20/2012, 11:13 AM

## 2012-12-20 NOTE — Progress Notes (Signed)
7/17  Recommend changing Novolog correction scale to [facility] & HS if patient is eating.  Smith Mince RN BSN CDE

## 2012-12-20 NOTE — Progress Notes (Signed)
[facility] PROGRESS NOTE  Caitlyn Buchanan ZOX:[id number] DOB: [birth year] DOA: 12/12/2012 PCP: Standley Dakins, MD  Assessment/Plan: #1 diabetes mellitus type 2 CBGs have improved and range from 66-126.hemoglobin A1c was 9.9 on 11/26/2012. Will decrease Lantusto 5 units daily. K. Sliding scale insulin as sensitive as patient has chronic kidney disease. Follow. On discharge will recommend resuming patient's Januvia. Patient to followup with PCP as outpatient. Outpatient diabetes education.  #2 hypertension Stable. Continue current regimen of Norvasc, clonidine, Avapro, Lopressor.  #3 status post cranioplasty Per primary team.  #4 hyperlipidemia Triglycerides was 242 and LDL of 77 on 11/26/2012.followup as outpatient.  #5 chronic kidney disease Stable.  Code Status: full Family Communication: updated patient and son at bedside. Disposition Plan: home hopefully tomorrow her primary team   Consultants:  None   Procedures:  Cranioplasty 12/12/2012  Antibiotics:  none  HPI/Subjective: Patient with no complaints. Noted CBGs in the 60s this morning.  Objective: Filed Vitals:   12/20/12 0213 12/20/12 0547 12/20/12 1022 12/20/12 1615  BP: 156/62 164/73 174/71 143/69  Pulse: 63 69 80 74  Temp: 97.2 F (36.2 C) 97.3 F (36.3 C) 97.2 F (36.2 C) 97.2 F (36.2 C)  TempSrc: Oral Oral Oral Oral  Resp:    18  Height:      Weight:      SpO2: 100% 98% 98% 100%    Intake/Output Summary (Last 24 hours) at 12/20/12 1635 Last data filed at 12/20/12 0602  Gross per 24 hour  Intake    240 ml  Output    525 ml  Net   -285 ml   Filed Weights   12/12/12 1209  Weight: 48.3 kg (106 lb 7.7 oz)    Exam:   General:  NAD  Cardiovascular: RRR  Respiratory: CTAB  Abdomen: sOFT/nt/nd/+bs  Extremities: No c/c/e  Data Reviewed: Basic Metabolic Panel:  Recent Labs Lab 12/13/12 2135 12/19/12 0835 12/19/12 2232  NA 137  --  137  K 4.0  --  3.7  CL 103  --  106   CO2 26  --  25  GLUCOSE 252* 412* 131*  BUN 29*  --  42*  CREATININE 2.63*  --  2.62*  CALCIUM 8.0*  --  8.7   Liver Function Tests: No results found for this basename: AST, ALT, ALKPHOS, BILITOT, PROT, ALBUMIN,  in the last 168 hours No results found for this basename: LIPASE, AMYLASE,  in the last 168 hours No results found for this basename: AMMONIA,  in the last 168 hours CBC: No results found for this basename: WBC, NEUTROABS, HGB, HCT, MCV, PLT,  in the last 168 hours Cardiac Enzymes: No results found for this basename: CKTOTAL, CKMB, CKMBINDEX, TROPONINI,  in the last 168 hours BNP (last 3 results)  Recent Labs  07/02/12 2116  PROBNP 184.3   CBG:  Recent Labs Lab 12/19/12 2344 12/20/12 0424 12/20/12 0604 12/20/12 0742 12/20/12 1245  GLUCAP 90 66* 98 126* 225*    Recent Results (from the past 240 hour(s))  URINE CULTURE     Status: None   Collection Time    12/13/12  7:18 PM      Result Value Range Status   Specimen Description URINE, CATHETERIZED   Final   Special Requests NONE   Final   Culture  Setup Time 12/13/2012 21:12   Final   Colony Count NO GROWTH   Final   Culture NO GROWTH   Final   Report Status 12/14/2012 FINAL  Final     Studies: No results found.  Scheduled Meds: . amLODipine  10 mg Oral Daily  . atorvastatin  40 mg Oral QPM  . cloNIDine  0.3 mg Oral TID  . insulin aspart  0-9 Units Subcutaneous TID WC  . insulin glargine  5 Units Subcutaneous QHS  . irbesartan  300 mg Oral QHS  . levETIRAcetam  500 mg Oral BID  . linagliptin  5 mg Oral Daily  . metoprolol tartrate  6.25 mg Oral BID  . polyethylene glycol  17 g Oral Daily   Continuous Infusions:   Active Problems:   Diabetes mellitus   Hyperlipidemia   HTN (hypertension)   CKD (chronic kidney disease) stage 3, GFR 30-59 ml/min   Subdural hematoma    Time spent: > 30 mins    [facility]  [facility] Pager [phone]. If 7PM-7AM, please contact  night-coverage at [email or web address], password [email or web address] 12/20/2012, 4:35 PM  LOS: 8 days

## 2012-12-21 LAB — GLUCOSE, CAPILLARY
Glucose-Capillary: 300 mg/dL — ABNORMAL HIGH (ref 70–99)
Glucose-Capillary: 300 mg/dL — ABNORMAL HIGH (ref 70–99)

## 2012-12-21 MED ORDER — INSULIN GLARGINE 100 UNIT/ML ~~LOC~~ SOLN
7.0000 [IU] | Freq: Every day | SUBCUTANEOUS | Status: DC
  Filled 2012-12-21: qty 0.07

## 2012-12-21 NOTE — Progress Notes (Signed)
I have seen and assessed the patient. Patient is in stable condition. CBGs have improved. Patient will continue her Januvia on discharge as well as the antihypertensive medications. Patient is stable, medical standpoint to be discharged home. Close followup has been arranged for patient to followup with PCP on 12/31/2012. Outpatient diabetes education is also been arranged. On followup patient's diabetes and hypertension as well as chronic medical issues will be followed up upon their. Patient will also followup with neurosurgery as recommended.

## 2012-12-21 NOTE — Discharge Summary (Signed)
Physician Discharge Summary  Patient ID: April [name] MRN: [id number] DOB/AGE: [birth year] 79 y.o.  Admit date: 12/12/2012 Discharge date: 12/21/2012  Admission Diagnoses:  skull defect  Discharge Diagnoses:  skull defect  Active Problems:   Diabetes mellitus   Hyperlipidemia   HTN (hypertension)   CKD (chronic kidney disease) stage 3, GFR 30-59 ml/min   Subdural hematoma   Discharged Condition: good  Hospital Course: Patient was admitted, underwent a right frontal parietal cranioplasty with retrieval of her bone flap from the right side of her abdomen. She has recovered well from that surgery, her incisions healed nicely. She is up and ambulating. She has had some difficulties with hyperglycemia and the [facility] service was consulted. They show that she will be able to be discharged on her usual home medications, although final clearance for discharge awaits their rounding on her today. She is to resume her usual activities at home, and is to followup with me in one month. She is also to followup with her primary physician within one week of discharge  Consults: [facility]  Discharge Exam: Blood pressure 183/78, pulse 78, temperature 98.4 F (36.9 C), temperature source Oral, resp. rate 20, height 5\' 2"  (1.575 m), weight 48.3 kg (106 lb 7.7 oz), SpO2 99.00%.  Disposition: Home   Future Appointments Provider Department Dept Phone   12/31/2012 10:00 AM [facility] Covering Provider [facility] [phone]       Medication List         amLODipine 10 MG tablet  Commonly known as:  NORVASC  Take 1 tablet (10 mg total) by mouth daily.     atorvastatin 40 MG tablet  Commonly known as:  LIPITOR  Take 1 tablet (40 mg total) by mouth every evening.     cloNIDine 0.1 MG tablet  Commonly known as:  CATAPRES  Take 3 tablets (0.3 mg total) by mouth 3 (three) times daily.     fish oil-omega-3 fatty acids 1000 MG capsule  Take 2  capsules (2 g total) by mouth daily.     irbesartan 300 MG tablet  Commonly known as:  AVAPRO  Take 1 tablet (300 mg total) by mouth at bedtime.     levETIRAcetam 500 MG tablet  Commonly known as:  KEPPRA  Take 1 tablet (500 mg total) by mouth 2 (two) times daily.     metoprolol tartrate 12.5 mg Tabs  Commonly known as:  LOPRESSOR  Take 6.25 mg by mouth 2 (two) times daily.     multivitamin with minerals Tabs  Take 1 tablet by mouth daily.     polyethylene glycol powder powder  Commonly known as:  MIRALAX  Take 17 g by mouth daily.     sitaGLIPtin 50 MG tablet  Commonly known as:  JANUVIA  Take 1 tablet (50 mg total) by mouth daily.           Follow-up Information   Follow up with [facility]     On 12/31/2012. (10am (as previously scheduled) for diabetic managment)    Contact information:   [address] [place] [address]  [phone]      Signed: [name], MD 12/21/2012, 8:19 AM

## 2012-12-21 NOTE — Progress Notes (Signed)
Patient is being discharged home at this time. Alert and in stable condition. Son came to pick her up.Discharge instructions read to son and patient and verbally responded understanding.

## 2012-12-21 NOTE — Progress Notes (Signed)
Inpatient Diabetes Program Recommendations  AACE/ADA: New Consensus Statement on Inpatient Glycemic Control (2013)  Target Ranges:  Prepandial:   less than 140 mg/dL      Peak postprandial:   less than 180 mg/dL (1-2 hours)      Critically ill patients:  140 - 180 mg/dL   Reason for Visit: Consult to order OP Diabetes Education follow-up  Note:  Discussed outpatient diabetes education referral with son.  He is agreeable to take patient after Home Health no longer following.  Placed [facility] with [facility]. Thank you.  Yeily Link S. Elsie Lincoln, RN, CNS, CDE Inpatient Diabetes Program, team pager [phone]

## 2012-12-24 LAB — GLUCOSE, CAPILLARY: Glucose-Capillary: 252 mg/dL — ABNORMAL HIGH (ref 70–99)

## 2012-12-31 ENCOUNTER — Ambulatory Visit: Payer: Medicare Other

## 2013-01-01 ENCOUNTER — Ambulatory Visit: Payer: Medicare Other | Attending: Family Medicine | Admitting: Internal Medicine

## 2013-01-01 VITALS — BP 217/95 | HR 83 | Temp 97.8°F | Resp 15

## 2013-01-01 DIAGNOSIS — E119 Type 2 diabetes mellitus without complications: Secondary | ICD-10-CM | POA: Insufficient documentation

## 2013-01-01 DIAGNOSIS — I10 Essential (primary) hypertension: Secondary | ICD-10-CM

## 2013-01-01 DIAGNOSIS — Z09 Encounter for follow-up examination after completed treatment for conditions other than malignant neoplasm: Secondary | ICD-10-CM

## 2013-01-01 MED ORDER — METOPROLOL TARTRATE 25 MG PO TABS: 25.0000 mg | ORAL_TABLET | Freq: Two times a day (BID) | ORAL | Status: DC

## 2013-01-01 NOTE — Progress Notes (Signed)
Patient ID: April [name], female   DOB: Jun 18, 1921, 79 y.o.   MRN: [id number]   April [name] [id number] DOB: [birth year] DOA: (Not on file)   PCP: Standley Dakins, MD   Chief Complaint:  Post hospitalization follow up  HPI:  AP view of female with history of diabetes mellitus (last A1c of 9.9), hypertension, hyperlipidemia, CKD stage III who had subdural hematoma status post craniotomy in February and March of this year and had cranioplasty done 2 weeks back and discharged from the hospital is here with her son for followup. Patient is able to answer questions appropriately. Denies any headache, blurred vision, dizziness, shortness of breath, chest pain, palpitations, fever, chills, nausea, vomiting, abdominal pain, polyuria, polydipsia tingling or numbness of extremities. Patient's son reports that he checks her blood sugar and usually ranges in 200s lately. He reports that she is irregular with diet and was on humulin at some time n past but stopped due to periods of hypoglycemia.  Review of Systems:  As outlined in history of present illness   Past Medical History  Diagnosis Date  . Diabetes mellitus   . Hypertension   . Hyperlipidemia   . Pneumonia   . Subdural hematoma   . Arthritis   . DVT (deep venous thrombosis)   . Incontinence   . Renal disorder     chronic kidney dz stage III   Past Surgical History  Procedure Laterality Date  . Insertion of vena cava filter    . Craniotomy Right 07/23/2012    Procedure: CRANIOTOMY HEMATOMA EVACUATION SUBDURAL;  Surgeon: [name], MD;  Location: [facility];  Service: Neurosurgery;  Laterality: Right;  . Craniotomy N/A 08/06/2012    Procedure: CRANIOTOMY HEMATOMA EVACUATION SUBDURAL;  Surgeon: [name], MD;  Location: [facility];  Service: Neurosurgery;  Laterality: N/A;  Craniectomy for evacuation of subdural hematoma, implantation of bone flap in abdominal wall   . Brain surgery    . Craniotomy N/A 12/12/2012   Procedure: CRANIOTOMY BONE FLAP/PROSTHETIC PLATE retrieval of bone from abdomen;  Surgeon: [name], MD;  Location: [facility];  Service: Neurosurgery;  Laterality: N/A;  cranioplasty with retrieval of bone flap from abdominal wall pocket. Site is head and abdomen.   Social History:  reports that she has never smoked. She has never used smokeless tobacco. She reports that she does not drink alcohol or use illicit drugs.  No Known Allergies  Family History  Problem Relation Age of Onset  . Diabetes Mellitus II Son     Prior to Admission medications   Medication Sig Start Date End Date Taking? Authorizing Provider  amLODipine (NORVASC) 10 MG tablet Take 1 tablet (10 mg total) by mouth daily. 08/17/12   Mcarthur Rossetti Angiulli, PA-C  atorvastatin (LIPITOR) 40 MG tablet Take 1 tablet (40 mg total) by mouth every evening. 08/17/12   Mcarthur Rossetti Angiulli, PA-C  cloNIDine (CATAPRES) 0.1 MG tablet Take 3 tablets (0.3 mg total) by mouth 3 (three) times daily. 11/26/12   Alison Murray, MD  fish oil-omega-3 fatty acids 1000 MG capsule Take 2 capsules (2 g total) by mouth daily. 11/27/12   Alison Murray, MD  irbesartan (AVAPRO) 300 MG tablet Take 1 tablet (300 mg total) by mouth at bedtime. 11/08/12   Reuben Likes, MD  levETIRAcetam (KEPPRA) 500 MG tablet Take 1 tablet (500 mg total) by mouth 2 (two) times daily. 08/17/12   Mcarthur Rossetti Angiulli, PA-C  metoprolol tartrate (LOPRESSOR) 12.5 mg  TABS Take 6.25 mg by mouth 2 (two) times daily.    Historical Provider, MD  Multiple Vitamin (MULTIVITAMIN WITH MINERALS) TABS Take 1 tablet by mouth daily. 08/17/12   Mcarthur Rossetti Angiulli, PA-C  polyethylene glycol powder (MIRALAX) powder Take 17 g by mouth daily. 11/08/12   Reuben Likes, MD  sitaGLIPtin (JANUVIA) 50 MG tablet Take 1 tablet (50 mg total) by mouth daily. 08/17/12   Charlton Amor, PA-C    Physical Exam:  Filed Vitals:   01/01/13 1127  BP: 217/95  Pulse: 83  Temp: 97.8 F (36.6 C)  Resp: 15  SpO2:  99%    Constitutional: Vital signs reviewed.  Patient is an elderly female in no acute distress HEENT: Scar over right scalp from recent cranioplasty which appears clean and well-healed. No pallor, moist oral mucosa Chest: Clear to auscultation bilaterally Cardiovascular: RRR, S1 normal, S2 normal,  Abdominal: Soft. Non-tender, non-distended, bowel sounds are normal,  Extremities: Warm, no edema CNS: AAO x3   Assessment/Plan Uncontrolled hypertension Blood pressure is elevated. Patient's son reports that she does take her medications regularly. I would increase her metoprolol dose to 25mg  twice a day. Continue clonidine, avapro and amlodipine at current dose  Uncontrolled diabetes Patient is an Januvia 50 mg daily. She has underlying CKD stage 3 as well. Her blood glucose is uncontrolled however given her age and renal disease I will not start her on insulin at this time and given that she is noncompliant with her diet at times she is a hiatus for developing hypoglycemic episodes. I have asked the son to keep a log of her blood glucose monitoring and bring it during the next visit. We'll recheck a hemoglobin A1c level during that time.  CKD stage 3 Last cr of 2.6 Monitor BMET during next visit  Hyperlipidemia continue fish oil and lipitor. Lipid panel during next visit  Recent cranioplasty  has f/up with neurosurgery  Follow up in 1 month. Son reports having difficulty to bring her earlier than that.   April [name]    01/01/2013, 11:46 AM

## 2013-01-01 NOTE — Progress Notes (Signed)
Patient here for follow up DM 

## 2013-01-08 ENCOUNTER — Emergency Department (HOSPITAL_COMMUNITY): Payer: Medicare Other

## 2013-01-08 ENCOUNTER — Encounter (HOSPITAL_COMMUNITY): Payer: Self-pay | Admitting: *Deleted

## 2013-01-08 ENCOUNTER — Inpatient Hospital Stay (HOSPITAL_COMMUNITY)
Admission: EM | Admit: 2013-01-08 | Discharge: 2013-01-11 | DRG: 682 | Disposition: A | Discharge status: Home Health | Payer: Medicare Other | Attending: Internal Medicine | Admitting: Internal Medicine

## 2013-01-08 DIAGNOSIS — R29810 Facial weakness: Secondary | ICD-10-CM

## 2013-01-08 DIAGNOSIS — R269 Unspecified abnormalities of gait and mobility: Secondary | ICD-10-CM

## 2013-01-08 DIAGNOSIS — I161 Hypertensive emergency: Secondary | ICD-10-CM

## 2013-01-08 DIAGNOSIS — Z8673 Personal history of transient ischemic attack (TIA), and cerebral infarction without residual deficits: Secondary | ICD-10-CM

## 2013-01-08 DIAGNOSIS — E785 Hyperlipidemia, unspecified: Secondary | ICD-10-CM | POA: Diagnosis present

## 2013-01-08 DIAGNOSIS — N183 Chronic kidney disease, stage 3 unspecified: Secondary | ICD-10-CM | POA: Diagnosis present

## 2013-01-08 DIAGNOSIS — E876 Hypokalemia: Secondary | ICD-10-CM | POA: Diagnosis present

## 2013-01-08 DIAGNOSIS — Z86718 Personal history of other venous thrombosis and embolism: Secondary | ICD-10-CM

## 2013-01-08 DIAGNOSIS — I10 Essential (primary) hypertension: Secondary | ICD-10-CM

## 2013-01-08 DIAGNOSIS — I129 Hypertensive chronic kidney disease with stage 1 through stage 4 chronic kidney disease, or unspecified chronic kidney disease: Principal | ICD-10-CM | POA: Diagnosis present

## 2013-01-08 DIAGNOSIS — E119 Type 2 diabetes mellitus without complications: Secondary | ICD-10-CM | POA: Diagnosis present

## 2013-01-08 DIAGNOSIS — D649 Anemia, unspecified: Secondary | ICD-10-CM | POA: Diagnosis present

## 2013-01-08 DIAGNOSIS — Z79899 Other long term (current) drug therapy: Secondary | ICD-10-CM

## 2013-01-08 DIAGNOSIS — R569 Unspecified convulsions: Secondary | ICD-10-CM | POA: Diagnosis present

## 2013-01-08 DIAGNOSIS — E46 Unspecified protein-calorie malnutrition: Secondary | ICD-10-CM | POA: Diagnosis present

## 2013-01-08 DIAGNOSIS — S065X9A Traumatic subdural hemorrhage with loss of consciousness of unspecified duration, initial encounter: Secondary | ICD-10-CM

## 2013-01-08 DIAGNOSIS — G9340 Encephalopathy, unspecified: Secondary | ICD-10-CM | POA: Diagnosis present

## 2013-01-08 LAB — URINALYSIS, ROUTINE W REFLEX MICROSCOPIC
Leukocytes, UA: NEGATIVE
Nitrite: NEGATIVE
Protein, ur: >300 mg/dL — AB
Specific Gravity, Urine: 1.016 (ref 1.005–1.030)
Urobilinogen, UA: 0.2 mg/dL (ref 0.0–1.0)

## 2013-01-08 LAB — URINE MICROSCOPIC-ADD ON

## 2013-01-08 LAB — DIFFERENTIAL
Eosinophils Absolute: 0.1 10*3/uL (ref 0.0–0.7)
Eosinophils Relative: 2 % (ref 0–5)
Lymphs Abs: 1.7 10*3/uL (ref 0.7–4.0)
Monocytes Absolute: 0.4 10*3/uL (ref 0.1–1.0)

## 2013-01-08 LAB — COMPREHENSIVE METABOLIC PANEL
ALT: 8 U/L (ref 0–35)
CO2: 25 mEq/L (ref 19–32)
Calcium: 9.3 mg/dL (ref 8.4–10.5)
Creatinine, Ser: 3.11 mg/dL — ABNORMAL HIGH (ref 0.50–1.10)
GFR calc Af Amer: 14 mL/min — ABNORMAL LOW (ref >90)
GFR calc non Af Amer: 12 mL/min — ABNORMAL LOW (ref >90)
Glucose, Bld: 78 mg/dL (ref 70–99)
Sodium: 139 mEq/L (ref 135–145)
Total Protein: 6.1 g/dL (ref 6.0–8.3)

## 2013-01-08 LAB — GLUCOSE, CAPILLARY
Glucose-Capillary: 79 mg/dL (ref 70–99)
Glucose-Capillary: 81 mg/dL (ref 70–99)

## 2013-01-08 LAB — CBC
HCT: 24.2 % — ABNORMAL LOW (ref 36.0–46.0)
MCH: 26.8 pg (ref 26.0–34.0)
MCV: 79.1 fL (ref 78.0–100.0)
Platelets: 285 10*3/uL (ref 150–400)
RBC: 3.06 MIL/uL — ABNORMAL LOW (ref 3.87–5.11)
RDW: 16.6 % — ABNORMAL HIGH (ref 11.5–15.5)

## 2013-01-08 LAB — PROTIME-INR: Prothrombin Time: 12.9 seconds (ref 11.6–15.2)

## 2013-01-08 LAB — POCT I-STAT TROPONIN I

## 2013-01-08 LAB — TROPONIN I: Troponin I: <0.3 ng/mL (ref <0.30)

## 2013-01-08 MED ORDER — POTASSIUM CHLORIDE 10 MEQ/100ML IV SOLN
10.0000 meq | INTRAVENOUS | Status: AC
  Administered 2013-01-08 – 2013-01-09 (×2): 10 meq via INTRAVENOUS
  Filled 2013-01-08 (×2): qty 100

## 2013-01-08 MED ORDER — NICARDIPINE HCL IN NACL 20-0.86 MG/200ML-% IV SOLN
5.0000 mg/h | Freq: Once | INTRAVENOUS | Status: AC
  Administered 2013-01-08: 5 mg/h via INTRAVENOUS
  Filled 2013-01-08: qty 200

## 2013-01-08 MED ORDER — HYDRALAZINE HCL 20 MG/ML IJ SOLN: 5.0000 mg | Freq: Once | INTRAMUSCULAR | Status: DC

## 2013-01-08 MED ORDER — HYDRALAZINE HCL 20 MG/ML IJ SOLN
10.0000 mg | INTRAMUSCULAR | Status: DC | PRN
  Administered 2013-01-09 (×2): 10 mg via INTRAVENOUS
  Filled 2013-01-08 (×3): qty 1

## 2013-01-08 NOTE — ED Notes (Signed)
Pts CBG is 28 RN notified

## 2013-01-08 NOTE — ED Notes (Signed)
Pt had subdural hematoma 3 weeks ago with cranial intervention.  Son reports patient is not responsive.  Pt awakens to voice and follows simple instruction with raising arms.

## 2013-01-08 NOTE — ED Notes (Signed)
Nicardipine drip decreased to 1mg /hr per ER MD.

## 2013-01-08 NOTE — ED Notes (Signed)
Admitting MD at bedside, pt awaiting inpt beds assignment.  

## 2013-01-08 NOTE — Consult Note (Addendum)
NEURO HOSPITALIST CONSULT NOTE    Reason for Consult:left face droop, decreased responsiveness, inability to walk.  HPI:                                                                                                                                          April [name] is an 79 y.o. female with past medical history significant for HTN, DM, hyperlipidemia, CKD stage 3, status surgical removal right SDH 2/14 and recurrent SDH reoperation 3/14 with recent cranioplasty, DVT, s/p vena cava filter placement, brought to [facility] ED with new onset left face weakness since yesterday and increasing confusion with decreased responsiveness. Patient's son stated that she is usually able to ambulate with a walker but for unclear reasons she stopped walking and eating yesterday and seems to have weakness of the left face. She complains of pain in her back but denies headache, dizziness, slurred speech, language or vision disturbances. CT brain upon arrival to the ED showed no acute/subacute intracranial abnormality. Currently on nicardipine drip 1 mg/hr.    Past Medical History  Diagnosis Date  . Diabetes mellitus   . Hypertension   . Hyperlipidemia   . Pneumonia   . Subdural hematoma   . Arthritis   . DVT (deep venous thrombosis)   . Incontinence   . Renal disorder     chronic kidney dz stage III    Past Surgical History  Procedure Laterality Date  . Insertion of vena cava filter    . Craniotomy Right 07/23/2012    Procedure: CRANIOTOMY HEMATOMA EVACUATION SUBDURAL;  Surgeon: [name], MD;  Location: [facility];  Service: Neurosurgery;  Laterality: Right;  . Craniotomy N/A 08/06/2012    Procedure: CRANIOTOMY HEMATOMA EVACUATION SUBDURAL;  Surgeon: [name], MD;  Location: [facility];  Service: Neurosurgery;  Laterality: N/A;  Craniectomy for evacuation of subdural hematoma, implantation of bone flap in abdominal wall   . Brain surgery    . Craniotomy N/A  12/12/2012    Procedure: CRANIOTOMY BONE FLAP/PROSTHETIC PLATE retrieval of bone from abdomen;  Surgeon: [name], MD;  Location: [facility];  Service: Neurosurgery;  Laterality: N/A;  cranioplasty with retrieval of bone flap from abdominal wall pocket. Site is head and abdomen.    Family History  Problem Relation Age of Onset  . Diabetes Mellitus II Son     Social History:  reports that she has never smoked. She has never used smokeless tobacco. She reports that she does not drink alcohol or use illicit drugs.  No Known Allergies  MEDICATIONS:  I have reviewed the patient's current medications.   ROS:                                                                                                                                       History obtained from chart review, son, and patient.  General ROS: negative for - chills, fatigue, fever, night sweats, weight gain or weight loss Psychological ROS: negative for - behavioral disorder, hallucinations, mood swings or suicidal ideation Ophthalmic ROS: negative for - blurry vision, double vision, eye pain or loss of vision ENT ROS: negative for - epistaxis, nasal discharge, oral lesions, sore throat, tinnitus or vertigo Allergy and Immunology ROS: negative for - hives or itchy/watery eyes Hematological and Lymphatic ROS: negative for - bleeding problems, bruising or swollen lymph nodes Endocrine ROS: negative for - galactorrhea, hair pattern changes, polydipsia/polyuria or temperature intolerance Respiratory ROS: negative for - cough, hemoptysis, shortness of breath or wheezing Cardiovascular ROS: negative for - chest pain, dyspnea on exertion, edema or irregular heartbeat Gastrointestinal ROS: negative for - abdominal pain, diarrhea, hematemesis, nausea/vomiting or stool incontinence Genito-Urinary ROS: negative for -  dysuria, hematuria, incontinence or urinary frequency/urgency Musculoskeletal ROS: significant for - low back pain and legs swelling. Neurological ROS: as noted in HPI Dermatological ROS: negative for rash and skin lesion changes     Physical exam: pleasant female in no apparent distress. Head: normocephalic. Neck: supple, no bruits, no JVD. Cardiac: no murmurs. Lungs: clear. Abdomen: soft, no tender, no mass. Extremities: bilateral pitting edema.   Neurologic Examination:                                                                                                      Mental Status: Alert, awake, oriented to place, thought content appropriate.  Speech fluent without evidence of aphasia.  Able to follow 3 step commands without difficulty. Cranial Nerves: II: Discs flat bilaterally; Visual fields grossly normal, pupils equal, round, reactive to light and accommodation III,IV, VI: ptosis not present, extra-ocular motions intact bilaterally V: facial light touch sensation normal bilaterally VII: left face weakness. VIII: hearing normal bilaterally IX,X: gag reflex present XI: bilateral shoulder shrug XII: midline tongue extension Motor: There is pain involved but she seems to be able to move all the limbs symmetrically Tone and bulk:normal tone throughout; no atrophy noted Sensory: Pinprick and light touch intact throughout, bilaterally Deep Tendon Reflexes:  1+ all over Plantars: Right: downgoing   Left: downgoing Cerebellar: normal finger-to-nose, heel-to-shin test no tested  due to back pain radiating to her legs. Gait:  No tested. CV: pulses palpable throughout    Lab Results  Component Value Date/Time   CHOL 171 11/26/2012 11:46 AM    Results for orders placed during the hospital encounter of 01/08/13 (from the past 48 hour(s))  GLUCOSE, CAPILLARY     Status: None   Collection Time    01/08/13  6:49 PM      Result Value Range   Glucose-Capillary 81  70 - 99  mg/dL  URINALYSIS, ROUTINE W REFLEX MICROSCOPIC     Status: Abnormal   Collection Time    01/08/13  7:05 PM      Result Value Range   Color, Urine YELLOW  YELLOW   APPearance CLEAR  CLEAR   Specific Gravity, Urine 1.016  1.005 - 1.030   pH 7.0  5.0 - 8.0   Glucose, UA 100 (*) NEGATIVE mg/dL   Hgb urine dipstick TRACE (*) NEGATIVE   Bilirubin Urine NEGATIVE  NEGATIVE   Ketones, ur NEGATIVE  NEGATIVE mg/dL   Protein, ur >811 (*) NEGATIVE mg/dL   Urobilinogen, UA 0.2  0.0 - 1.0 mg/dL   Nitrite NEGATIVE  NEGATIVE   Leukocytes, UA NEGATIVE  NEGATIVE  URINE MICROSCOPIC-ADD ON     Status: Abnormal   Collection Time    01/08/13  7:05 PM      Result Value Range   Squamous Epithelial / LPF RARE  RARE   WBC, UA 0-2  <3 WBC/hpf   RBC / HPF 0-2  <3 RBC/hpf   Bacteria, UA RARE  RARE   Casts HYALINE CASTS (*) NEGATIVE   Urine-Other AMORPHOUS URATES/PHOSPHATES    GLUCOSE, CAPILLARY     Status: None   Collection Time    01/08/13  7:24 PM      Result Value Range   Glucose-Capillary 79  70 - 99 mg/dL   Comment 1 Documented in Chart     Comment 2 Notify RN    PROTIME-INR     Status: None   Collection Time    01/08/13  7:36 PM      Result Value Range   Prothrombin Time 12.9  11.6 - 15.2 seconds   INR 0.99  0.00 - 1.49  APTT     Status: None   Collection Time    01/08/13  7:36 PM      Result Value Range   aPTT 28  24 - 37 seconds  CBC     Status: Abnormal   Collection Time    01/08/13  7:36 PM      Result Value Range   WBC 4.9  4.0 - 10.5 K/uL   RBC 3.06 (*) 3.87 - 5.11 MIL/uL   Hemoglobin 8.2 (*) 12.0 - 15.0 g/dL   HCT 91.4 (*) 78.2 - 95.6 %   MCV 79.1  78.0 - 100.0 fL   MCH 26.8  26.0 - 34.0 pg   MCHC 33.9  30.0 - 36.0 g/dL   RDW 21.3 (*) 08.6 - 57.8 %   Platelets 285  150 - 400 K/uL  DIFFERENTIAL     Status: None   Collection Time    01/08/13  7:36 PM      Result Value Range   Neutrophils Relative % 56  43 - 77 %   Neutro Abs 2.8  1.7 - 7.7 K/uL   Lymphocytes Relative 34   12 - 46 %   Lymphs Abs 1.7  0.7 - 4.0 K/uL  Monocytes Relative 8  3 - 12 %   Monocytes Absolute 0.4  0.1 - 1.0 K/uL   Eosinophils Relative 2  0 - 5 %   Eosinophils Absolute 0.1  0.0 - 0.7 K/uL   Basophils Relative 1  0 - 1 %   Basophils Absolute 0.0  0.0 - 0.1 K/uL  COMPREHENSIVE METABOLIC PANEL     Status: Abnormal   Collection Time    01/08/13  7:36 PM      Result Value Range   Sodium 139  135 - 145 mEq/L   Potassium 3.0 (*) 3.5 - 5.1 mEq/L   Chloride 106  96 - 112 mEq/L   CO2 25  19 - 32 mEq/L   Glucose, Bld 78  70 - 99 mg/dL   BUN 34 (*) 6 - 23 mg/dL   Creatinine, Ser 0.98 (*) 0.50 - 1.10 mg/dL   Calcium 9.3  8.4 - 11.9 mg/dL   Total Protein 6.1  6.0 - 8.3 g/dL   Albumin 2.0 (*) 3.5 - 5.2 g/dL   AST 16  0 - 37 U/L   ALT 8  0 - 35 U/L   Alkaline Phosphatase 84  39 - 117 U/L   Total Bilirubin 0.2 (*) 0.3 - 1.2 mg/dL   GFR calc non Af Amer 12 (*) >90 mL/min   GFR calc Af Amer 14 (*) >90 mL/min   Comment:            The eGFR has been calculated     using the CKD EPI equation.     This calculation has not been     validated in all clinical     situations.     eGFR's persistently     <90 mL/min signify     possible Chronic Kidney Disease.  TROPONIN I     Status: None   Collection Time    01/08/13  7:36 PM      Result Value Range   Troponin I <0.30  <0.30 ng/mL   Comment:            Due to the release kinetics of cTnI,     a negative result within the first hours     of the onset of symptoms does not rule out     myocardial infarction with certainty.     If myocardial infarction is still suspected,     repeat the test at appropriate intervals.  POCT I-STAT TROPONIN I     Status: None   Collection Time    01/08/13  7:56 PM      Result Value Range   Troponin i, poc 0.01  0.00 - 0.08 ng/mL   Comment 3            Comment: Due to the release kinetics of cTnI,     a negative result within the first hours     of the onset of symptoms does not rule out     myocardial  infarction with certainty.     If myocardial infarction is still suspected,     repeat the test at appropriate intervals.    Ct Head (brain) Wo Contrast  01/08/2013   *RADIOLOGY REPORT*  Clinical Data: Altered mental status.  Recent subdural hematoma.  CT HEAD WITHOUT CONTRAST  Technique:  Contiguous axial images were obtained from the base of the skull through the vertex without contrast.  Comparison: 12/13/2012  Findings: There is no acute intracranial hemorrhage, mass lesion, or infarction.  The air and fluid in the subdural space deep to the cranioplasty of the right has diminished with secondary decreased mass effect upon the underlying brain. infarct as well as a lacunar infarcts in the basal ganglia, all stable.  IMPRESSION: No acute intracranial abnormality.  Decreased air and fluid in the subdural space deep to the cranioplasty on the right.   Original Report Authenticated By: Francene Boyers, M.D.   Dg Chest Portable 1 View  01/08/2013   *RADIOLOGY REPORT*  Clinical Data: Altered mental status, hypertension  PORTABLE CHEST - 1 VIEW  Comparison: December 06, 2012  Findings: There is patchy consolidation of left lung base.  There is no pulmonary edema.  The mediastinal contours normal.  The heart size is probably mildly enlarged.  The soft tissues and osseous structures are stable.  IMPRESSION: Mild patchy consolidation of left lung base,suspicious for pneumonia.   Original Report Authenticated By: Sherian Rein, M.D.     Assessment/Plan: 79 y/o with left face droop, decreased responsiveness, inability to walk for more than 24 hours. Left face weakness on exam. CT brain showed no SDH or acute stroke. I still suspect a right brain syndrome, as her neuro-exam is not suggestive of a spinal cord, radicular, or neuropathic syndrome. She needs to be re image in the next 24 hours but I'm unsure if her vena cava filter is MRI compatible (will repeat CT brain if MRI not feasible). PT consult. Neurology will  continue to follow.  Wyatt Portela, MD [facility] [phone]  01/08/2013, 10:49 PM

## 2013-01-08 NOTE — ED Notes (Signed)
Nicardipine drip restarted per MD for decrease in pt blood pressure. Pt has no decrease change in neuro status at this time. Pt resting with eyes closed, resp e/u. Pt is arousable to verbal stimuli and will continue to monitor pt.

## 2013-01-08 NOTE — ED Provider Notes (Addendum)
I saw and evaluated the patient, reviewed the resident's note and I agree with the findings and plan.  @ 2055 I spoke with Neurosurgery on call who will review CT, but recommended neuro consult.    @ 2100 BP still elevated will adjust drip of Nicardipine.  No clinical change in exam.  Pt is resting and son is at bedside.  Plan for Neuro eval and admit.     @ 2130 I spoke with Neurology Dr. Leroy Kennedy who will evaluate pt.  BP initially dropped to SBP in 130s; Nicardipine was d/c'ed.  Pt is w/o acute changes.  @ 2230 Pt is stable.  BP is labile with Nicardipine titration. Concern for managing BP since pt has h/o recent ICH and is at risk for rebleed, however she is fairly responsive to low doses of Nicardipine.  Will attempt to continue to titrate vice switching agents.  Case d/w Neuro who agrees with admit.  Per Medicine team pt will need admission to ICU due to Nicardipine gtt.  Will c/s ICU.    @ 2345 Case d/w ICU attending Dr. Herma Carson who recommends SDU admission.  Nicardipine gtt d/ced and Hydralazine stared 5 mg x one.  Will c/s with medicine team.  Pt to be t/o to oncoming attending physician pending medicine admit.     Date: 01/09/2013  Rate: 63  Rhythm: normal sinus rhythm  QRS Axis: normal  Intervals: normal  ST/T Wave abnormalities: nonspecific T wave changes  Conduction Disutrbances:none  Narrative Interpretation:   Old EKG Reviewed: none available    Darlys Gales, MD 01/08/13 2101  Darlys Gales, MD 01/08/13 1610  Darlys Gales, MD 01/08/13 9604  Darlys Gales, MD 01/08/13 5409  Darlys Gales, MD 01/09/13 0002

## 2013-01-08 NOTE — ED Provider Notes (Signed)
CSN: [id number]     Arrival date & time 01/08/13  1846 History     None    Chief Complaint  Patient presents with  . Altered Mental Status  . Hypertension   (Consider location/radiation/quality/duration/timing/severity/associated sxs/prior Treatment) HPI 79 year old female with history of diabetes, hypertension, hyperlipidemia, subdural hematoma in February of 2014 status post craniotomy and status post craniotomy prosthetic plate placement on 12/12/2012 presents with altered mental status and left facial droop of greater than for 24 hours. Son reports that she was in her normal state of health yesterday and a round lunchtime he began to notice that he had a left sided facial droop. Also notes that patient's stopped speaking. She is typically ambulatory with a walker, however since yesterday has not wanted to walk. Reports she has left-sided weakness of both her arm and leg. Also reports that she appears fatigued, and is sleeping easily.  Son reports that change was sudden and was a severe difference.  Patient has reported some headache and nausea. Denies any recent falls, cough, shortness of breath, chest pain.  Past Medical History  Diagnosis Date  . Diabetes mellitus   . Hypertension   . Hyperlipidemia   . Pneumonia   . Subdural hematoma   . Arthritis   . DVT (deep venous thrombosis)   . Incontinence   . Renal disorder     chronic kidney dz stage III   Past Surgical History  Procedure Laterality Date  . Insertion of vena cava filter    . Craniotomy Right 07/23/2012    Procedure: CRANIOTOMY HEMATOMA EVACUATION SUBDURAL;  Surgeon: [name], MD;  Location: [facility];  Service: Neurosurgery;  Laterality: Right;  . Craniotomy N/A 08/06/2012    Procedure: CRANIOTOMY HEMATOMA EVACUATION SUBDURAL;  Surgeon: [name], MD;  Location: [facility];  Service: Neurosurgery;  Laterality: N/A;  Craniectomy for evacuation of subdural hematoma, implantation of bone flap in  abdominal wall   . Brain surgery    . Craniotomy N/A 12/12/2012    Procedure: CRANIOTOMY BONE FLAP/PROSTHETIC PLATE retrieval of bone from abdomen;  Surgeon: [name], MD;  Location: [facility];  Service: Neurosurgery;  Laterality: N/A;  cranioplasty with retrieval of bone flap from abdominal wall pocket. Site is head and abdomen.   Family History  Problem Relation Age of Onset  . Diabetes Mellitus II Son    History  Substance Use Topics  . Smoking status: Never Smoker   . Smokeless tobacco: Never Used  . Alcohol Use: No   OB History   Grav Para Term Preterm Abortions TAB SAB Ect Mult Living                 Review of Systems  Constitutional: Positive for activity change, appetite change and fatigue. Negative for fever and chills.  HENT: Negative for sore throat and neck pain.   Eyes: Negative for visual disturbance.  Respiratory: Negative for cough and shortness of breath.   Cardiovascular: Negative for chest pain.  Gastrointestinal: Negative for nausea, vomiting, abdominal pain, diarrhea and constipation.  Genitourinary: Negative for difficulty urinating.  Musculoskeletal: Negative for back pain.  Skin: Negative for rash.  Neurological: Positive for facial asymmetry, speech difficulty, weakness and light-headedness. Negative for dizziness, syncope, numbness and headaches.    Allergies  Review of patient's allergies indicates no known allergies.  Home Medications   Current Outpatient Rx  Name  Route  Sig  Dispense  Refill  . amLODipine (NORVASC) 10 MG tablet  Oral   Take 1 tablet (10 mg total) by mouth daily.   30 tablet   1   . atorvastatin (LIPITOR) 40 MG tablet   Oral   Take 1 tablet (40 mg total) by mouth every evening.   30 tablet   1   . cloNIDine (CATAPRES) 0.1 MG tablet   Oral   Take 3 tablets (0.3 mg total) by mouth 3 (three) times daily.   90 tablet   3   . fish oil-omega-3 fatty acids 1000 MG capsule   Oral   Take 2 capsules (2 g  total) by mouth daily.   30 capsule   3   . glucose 4 GM chewable tablet   Oral   Chew 16 g by mouth as needed for low blood sugar.         . irbesartan (AVAPRO) 300 MG tablet   Oral   Take 1 tablet (300 mg total) by mouth at bedtime.   30 tablet   3   . levETIRAcetam (KEPPRA) 500 MG tablet   Oral   Take 1 tablet (500 mg total) by mouth 2 (two) times daily.   60 tablet   1   . metoprolol tartrate (LOPRESSOR) 25 MG tablet   Oral   Take 1 tablet (25 mg total) by mouth 2 (two) times daily.   60 tablet   2   . Multiple Vitamin (MULTIVITAMIN WITH MINERALS) TABS   Oral   Take 1 tablet by mouth daily.         . polyethylene glycol powder (MIRALAX) powder   Oral   Take 17 g by mouth daily.   255 g   0   . sitaGLIPtin (JANUVIA) 50 MG tablet   Oral   Take 1 tablet (50 mg total) by mouth daily.   30 tablet   1    BP 175/70  Pulse 71  Temp(Src) 98.7 F (37.1 C) (Rectal)  Resp 22  SpO2 100% Physical Exam  Nursing note and vitals reviewed. Constitutional: She appears well-developed and well-nourished. She appears ill. No distress.  Sleepy but easily aroused  HENT:  Head: Normocephalic and atraumatic.  Eyes: Conjunctivae and EOM are normal. Pupils are equal, round, and reactive to light.  Neck: Normal range of motion.  Cardiovascular: Regular rhythm, normal heart sounds and intact distal pulses.  Bradycardia present.  Exam reveals no gallop and no friction rub.   No murmur heard. Pulmonary/Chest: Effort normal and breath sounds normal. No respiratory distress. She has no wheezes. She has no rales.  Abdominal: Soft. She exhibits no distension. There is no tenderness. There is no guarding.  Musculoskeletal: She exhibits edema (trace bilaterally). She exhibits no tenderness.  Neurological: She is alert. She displays no tremor. She exhibits normal muscle tone. She displays no seizure activity. Coordination (difficulty following commands) normal. GCS eye subscore is 4.  GCS verbal subscore is 5. GCS motor subscore is 6.  Left facial droop (new per son since yesterday), Gait not tested due to difficulty discussed with son,  Patient with difficulty following commands.  Left side neglect with bilateral stimuli.  Slow to answer questions.  Oriented to self but unable to answer location/time.  Answers one word.  Patient with difficulty following commands but with equal appearing strength bilaterally.  Skin: Skin is warm and dry. No rash noted. She is not diaphoretic. No erythema.    ED Course   Procedures (including critical care time)  Labs Reviewed  CBC - Abnormal;  Notable for the following:    RBC 3.06 (*)    Hemoglobin 8.2 (*)    HCT 24.2 (*)    RDW 16.6 (*)    All other components within normal limits  COMPREHENSIVE METABOLIC PANEL - Abnormal; Notable for the following:    Potassium 3.0 (*)    BUN 34 (*)    Creatinine, Ser 3.11 (*)    Albumin 2.0 (*)    Total Bilirubin 0.2 (*)    GFR calc non Af Amer 12 (*)    GFR calc Af Amer 14 (*)    All other components within normal limits  URINALYSIS, ROUTINE W REFLEX MICROSCOPIC - Abnormal; Notable for the following:    Glucose, UA 100 (*)    Hgb urine dipstick TRACE (*)    Protein, ur >300 (*)    All other components within normal limits  URINE MICROSCOPIC-ADD ON - Abnormal; Notable for the following:    Casts HYALINE CASTS (*)    All other components within normal limits  PROTIME-INR  APTT  DIFFERENTIAL  TROPONIN I  GLUCOSE, CAPILLARY  GLUCOSE, CAPILLARY  POCT I-STAT TROPONIN I   Ct Head (brain) Wo Contrast  01/08/2013   *RADIOLOGY REPORT*  Clinical Data: Altered mental status.  Recent subdural hematoma.  CT HEAD WITHOUT CONTRAST  Technique:  Contiguous axial images were obtained from the base of the skull through the vertex without contrast.  Comparison: 12/13/2012  Findings: There is no acute intracranial hemorrhage, mass lesion, or infarction.  The air and fluid in the subdural space deep to the  cranioplasty of the right has diminished with secondary decreased mass effect upon the underlying brain. infarct as well as a lacunar infarcts in the basal ganglia, all stable.  IMPRESSION: No acute intracranial abnormality.  Decreased air and fluid in the subdural space deep to the cranioplasty on the right.   Original Report Authenticated By: Francene Boyers, M.D.   Dg Chest Portable 1 View  01/08/2013   *RADIOLOGY REPORT*  Clinical Data: Altered mental status, hypertension  PORTABLE CHEST - 1 VIEW  Comparison: December 06, 2012  Findings: There is patchy consolidation of left lung base.  There is no pulmonary edema.  The mediastinal contours normal.  The heart size is probably mildly enlarged.  The soft tissues and osseous structures are stable.  IMPRESSION: Mild patchy consolidation of left lung base,suspicious for pneumonia.   Original Report Authenticated By: Sherian Rein, M.D.   1. Hypertensive emergency   2. Acute encephalopathy   3. CKD (chronic kidney disease) stage 3, GFR 30-59 ml/min   4. Diabetes mellitus   5. HTN (hypertension)   6. Hypokalemia       MDM  79 year old female with history of diabetes, hypertension, hyperlipidemia, subdural hematoma in February of 2014 status post craniotomy and status post craniotomy prosthetic plate placement on 12/12/2012 presents with altered mental status and left facial droop of greater than for 24 hours.  Patient hypertensive to 256/80 with a heart rate of 59 on arrival to the emergency department and left facial droop and difficulty answering questions as well as left sided neglect noted on exam concerning for stroke and potential hypertensive emergency.  An emergent head CT was performed to evaluate for complications s/p craniotomy, other intracranial hemorrhage, or evidence of large territory ischemia. CT head was within normal limits given recent surgery.  Given concern of hypertensive emergency with systolic BP greater than 250s and concern for  intracranial pathology, patient was started on a nicardipine  drip at 5.  However, her blood pressure decreased to 132/62 with this drip and it was discontinued. The patient's blood pressure was noted to rise once again to nearly 200 systolic after which the nicardipine drip was again started at 1. Neurology was consulted consulted for concern for ischemic stroke. Neurology recommended admission for further evaluation and imaging given concern for possible ischemic stroke. It is unclear if patient's IVC filter will prevent her from getting an MRI.  Chest x-ray was performed originally to evaluate for possibility of dissection as etiology for her neurologic symptoms, or signs of CHF given hypertension.  Chest x-ray did show an area of left sided consolidation, however because patient has been afebrile, has not had a cough, and has other symptoms suggesting neurologic etiology of her presentation, have low suspicion for pneumonia.   Labs were significant for a normal glucose, acute on chronic renal failure with a creatinine of 3.1. Patient was also noted to be hypokalemic to 3 and potassium replacement was ordered.  Hemoglobin was noted to be at 8.2 which is decreased from other values however family and patient deny any dark black or bloody stool, and patient has had similar values in the past.  Patient had EKG that was evaluated by me and showed sinus rhythm with HR 60s and no ST changes to suggest ischemia and patient had negative troponin and have low suspicion for ACS as cause of altered mental status.  Urinalysis does not show signs of infection.  Most likely cause of patient's symptoms is ischemic stroke and hypertension.  Nicardipine drip discontinued and patient started on prn hydralazine.  Patient to be admitted to hospitalist step down unit and admitted in stable condition.     Rhae Lerner, MD 01/09/13 [phone]

## 2013-01-08 NOTE — Consult Note (Signed)
PULMONARY  / CRITICAL CARE MEDICINE  Name: April [name] MRN: [id number] DOB: 1921/06/26    ADMISSION DATE:  01/08/2013 CONSULTATION DATE:  01/08/2013  REFERRING MD :  EDP PRIMARY SERVICE:  TRH  CHIEF COMPLAINT:  Hypertensive emergency  BRIEF PATIENT DESCRIPTION: 79 y/o with HTN, DM and recent craniotomy x 2 for  SHD presents with generalized weakness and altered mental status for 1 day, baseline is unknown.  In ED:  Hypertensive  258/78, now in Cardene gtt@1  with BP 147/67.  Noted to have L facial weakness, again baseline is unknown.  Head CT negative.   SIGNIFICANT EVENTS / STUDIES:  8/5  Head CT >>> nad, decreased air and fluid in the L subdural space  LINES / TUBES:  CULTURES:  ANTIBIOTICS:  HISTORY OF PRESENT ILLNESS:  79 y/o with HTN, DM and recent craniotomy x 2 for  SHD presents with generalized weakness and altered mental status for 1 day, baseline is unknown.  In ED:  Hypertensive  258/78, now in Cardene gtt@1  with BP 147/67.  Noted to have L facial weakness, again baseline is unknown.  Head CT negative.   PAST MEDICAL HISTORY :  Past Medical History  Diagnosis Date  . Diabetes mellitus   . Hypertension   . Hyperlipidemia   . Pneumonia   . Subdural hematoma   . Arthritis   . DVT (deep venous thrombosis)   . Incontinence   . Renal disorder     chronic kidney dz stage III   Past Surgical History  Procedure Laterality Date  . Insertion of vena cava filter    . Craniotomy Right 07/23/2012    Procedure: CRANIOTOMY HEMATOMA EVACUATION SUBDURAL;  Surgeon: [name], MD;  Location: [facility];  Service: Neurosurgery;  Laterality: Right;  . Craniotomy N/A 08/06/2012    Procedure: CRANIOTOMY HEMATOMA EVACUATION SUBDURAL;  Surgeon: [name], MD;  Location: [facility];  Service: Neurosurgery;  Laterality: N/A;  Craniectomy for evacuation of subdural hematoma, implantation of bone flap in abdominal wall   . Brain surgery    . Craniotomy N/A 12/12/2012   Procedure: CRANIOTOMY BONE FLAP/PROSTHETIC PLATE retrieval of bone from abdomen;  Surgeon: [name], MD;  Location: [facility];  Service: Neurosurgery;  Laterality: N/A;  cranioplasty with retrieval of bone flap from abdominal wall pocket. Site is head and abdomen.   Prior to Admission medications   Medication Sig Start Date End Date Taking? Authorizing Provider  amLODipine (NORVASC) 10 MG tablet Take 1 tablet (10 mg total) by mouth daily. 08/17/12  Yes Daniel J Angiulli, PA-C  atorvastatin (LIPITOR) 40 MG tablet Take 1 tablet (40 mg total) by mouth every evening. 08/17/12  Yes Daniel J Angiulli, PA-C  cloNIDine (CATAPRES) 0.1 MG tablet Take 3 tablets (0.3 mg total) by mouth 3 (three) times daily. 11/26/12  Yes Alison Murray, MD  fish oil-omega-3 fatty acids 1000 MG capsule Take 2 capsules (2 g total) by mouth daily. 11/27/12  Yes Alison Murray, MD  glucose 4 GM chewable tablet Chew 16 g by mouth as needed for low blood sugar.   Yes Historical Provider, MD  irbesartan (AVAPRO) 300 MG tablet Take 1 tablet (300 mg total) by mouth at bedtime. 11/08/12  Yes Reuben Likes, MD  levETIRAcetam (KEPPRA) 500 MG tablet Take 1 tablet (500 mg total) by mouth 2 (two) times daily. 08/17/12  Yes Daniel J Angiulli, PA-C  metoprolol tartrate (LOPRESSOR) 25 MG tablet Take 1 tablet (25 mg total)  by mouth 2 (two) times daily. 01/01/13  Yes Nishant Dhungel, MD  Multiple Vitamin (MULTIVITAMIN WITH MINERALS) TABS Take 1 tablet by mouth daily. 08/17/12  Yes Daniel J Angiulli, PA-C  polyethylene glycol powder (MIRALAX) powder Take 17 g by mouth daily. 11/08/12  Yes Reuben Likes, MD  sitaGLIPtin (JANUVIA) 50 MG tablet Take 1 tablet (50 mg total) by mouth daily. 08/17/12  Yes Daniel J Angiulli, PA-C   No Known Allergies  FAMILY HISTORY:  Family History  Problem Relation Age of Onset  . Diabetes Mellitus II Son    SOCIAL HISTORY:  reports that she has never smoked. She has never used smokeless tobacco. She reports that  she does not drink alcohol or use illicit drugs.  REVIEW OF SYSTEMS:  Unable to provide  SUBJECTIVE:   VITAL SIGNS: Temp:  [98.7 F (37.1 C)] 98.7 F (37.1 C) (08/05 1859) Pulse Rate:  [51-66] 53 (08/05 2310) Resp:  [10-23] 19 (08/05 2310) BP: (133-258)/(59-80) 147/67 mmHg (08/05 2310) SpO2:  [98 %-100 %] 99 % (08/05 2310)  PHYSICAL EXAMINATION: General:  Resting comfortably, appears to be in no acute distress, appears malnourished Neuro:  Sleepy but arouses to stimulation, oriented x 1, L facial weakness noted, protects airway HEENT:  PERRL, dry Neck:  Soft, no bruits, no lymphadenopathy Cardiovascular:  Bradycardic, RRR, no m/r/g Lungs:  Bilateral air entry, no w/r/r Abdomen:  Soft, nontender, bowel sounds present Musculoskeletal:  Moves all extremities, no edema Skin:  Intact, no rash  Recent Labs Lab 01/08/13 1936  NA 139  K 3.0*  CL 106  CO2 25  BUN 34*  CREATININE 3.11*  GLUCOSE 78    Recent Labs Lab 01/08/13 1936  HGB 8.2*  HCT 24.2*  WBC 4.9  PLT 285   Ct Head (brain) Wo Contrast  01/08/2013   *RADIOLOGY REPORT*  Clinical Data: Altered mental status.  Recent subdural hematoma.  CT HEAD WITHOUT CONTRAST  Technique:  Contiguous axial images were obtained from the base of the skull through the vertex without contrast.  Comparison: 12/13/2012  Findings: There is no acute intracranial hemorrhage, mass lesion, or infarction.  The air and fluid in the subdural space deep to the cranioplasty of the right has diminished with secondary decreased mass effect upon the underlying brain. infarct as well as a lacunar infarcts in the basal ganglia, all stable.  IMPRESSION: No acute intracranial abnormality.  Decreased air and fluid in the subdural space deep to the cranioplasty on the right.   Original Report Authenticated By: Francene Boyers, M.D.   Dg Chest Portable 1 View  01/08/2013   *RADIOLOGY REPORT*  Clinical Data: Altered mental status, hypertension  PORTABLE CHEST - 1  VIEW  Comparison: December 06, 2012  Findings: There is patchy consolidation of left lung base.  There is no pulmonary edema.  The mediastinal contours normal.  The heart size is probably mildly enlarged.  The soft tissues and osseous structures are stable.  IMPRESSION: Mild patchy consolidation of left lung base,suspicious for pneumonia.   Original Report Authenticated By: Sherian Rein, M.D.   ASSESSMENT / PLAN:  Hypertensive emergency History of SHD s/p evacuation x 2, no evidence of recurrence Isolated L facial weakness, not clear if present preadmission Encephalopathy, multifactorial (hypertension, old SDH), baseline unknown Hypokalemia Malnutrition Diabetes without hyperglycemia CKD likely secondary to HTN and DM Anemia  -->  Appears stable for SDU admission under [facility]:  BP@goal  and protects airway -->  Cardene gtt d/c'd -->  Hydralazine PRN started -->  Restart Norvasc, Avapro, Clonidine, Metoprolol when able (risk of Clonidine withdrawal with rebound hypertension) -->  Continue Keppra as preadmission -->  Would resume Januvia, Lipitor when able -->  Replace K -->  SSI for now -->  DVT Px -->  Further neurological work up per Neurology  Lonia Farber, MD Pulmonary and Critical Care Medicine [facility] Pager: [phone]  01/08/2013, 11:48 PM

## 2013-01-08 NOTE — ED Notes (Signed)
Report received, 1st contact with pt and will assume care of pt at this time. 

## 2013-01-08 NOTE — ED Notes (Signed)
Pt Nicardipine drip discontinued at this time per MD orders, Pt has decrease in blood pressure at this time. Pt is resting with  Eyes closed, no s/s of any pain or distress observed with resp e/u, equal chest rise and fall, INAD. No obvious decrease in neuro status noted and will continue to monitor pt.

## 2013-01-08 NOTE — ED Notes (Signed)
MD Mas at bedside for pt evaluation, MD verbal order to hold on pt  Swallow screen due to pt facial drooping.

## 2013-01-09 ENCOUNTER — Inpatient Hospital Stay (HOSPITAL_COMMUNITY): Payer: Medicare Other

## 2013-01-09 ENCOUNTER — Other Ambulatory Visit: Payer: Self-pay | Admitting: Family Medicine

## 2013-01-09 DIAGNOSIS — G9340 Encephalopathy, unspecified: Secondary | ICD-10-CM | POA: Diagnosis present

## 2013-01-09 DIAGNOSIS — E876 Hypokalemia: Secondary | ICD-10-CM | POA: Diagnosis present

## 2013-01-09 DIAGNOSIS — E119 Type 2 diabetes mellitus without complications: Secondary | ICD-10-CM

## 2013-01-09 DIAGNOSIS — I10 Essential (primary) hypertension: Secondary | ICD-10-CM

## 2013-01-09 DIAGNOSIS — I517 Cardiomegaly: Secondary | ICD-10-CM

## 2013-01-09 LAB — BASIC METABOLIC PANEL
BUN: 33 mg/dL — ABNORMAL HIGH (ref 6–23)
CO2: 25 mEq/L (ref 19–32)
Chloride: 106 mEq/L (ref 96–112)
GFR calc non Af Amer: 13 mL/min — ABNORMAL LOW (ref >90)
Glucose, Bld: 137 mg/dL — ABNORMAL HIGH (ref 70–99)
Potassium: 3.1 mEq/L — ABNORMAL LOW (ref 3.5–5.1)
Sodium: 141 mEq/L (ref 135–145)

## 2013-01-09 LAB — LIPID PANEL
LDL Cholesterol: 150 mg/dL — ABNORMAL HIGH (ref 0–99)
Triglycerides: 190 mg/dL — ABNORMAL HIGH (ref <150)

## 2013-01-09 LAB — GLUCOSE, CAPILLARY
Glucose-Capillary: 87 mg/dL (ref 70–99)
Glucose-Capillary: 106 mg/dL — ABNORMAL HIGH (ref 70–99)

## 2013-01-09 LAB — HEMOGLOBIN A1C: Hgb A1c MFr Bld: 8.7 % — ABNORMAL HIGH (ref <5.7)

## 2013-01-09 MED ORDER — GLUCOSE 4 G PO CHEW
16.0000 g | CHEWABLE_TABLET | ORAL | Status: DC | PRN
  Filled 2013-01-09: qty 4

## 2013-01-09 MED ORDER — POLYETHYLENE GLYCOL 3350 17 GM/SCOOP PO POWD
17.0000 g | Freq: Every day | ORAL | Status: DC
  Filled 2013-01-09: qty 255

## 2013-01-09 MED ORDER — POLYETHYLENE GLYCOL 3350 17 G PO PACK
17.0000 g | PACK | Freq: Every day | ORAL | Status: DC
  Administered 2013-01-09 – 2013-01-11 (×3): 17 g via ORAL
  Filled 2013-01-09 (×3): qty 1

## 2013-01-09 MED ORDER — HEPARIN SODIUM (PORCINE) 5000 UNIT/ML IJ SOLN
5000.0000 [IU] | Freq: Three times a day (TID) | INTRAMUSCULAR | Status: DC
  Administered 2013-01-09 – 2013-01-11 (×5): 5000 [IU] via SUBCUTANEOUS
  Filled 2013-01-09 (×10): qty 1

## 2013-01-09 MED ORDER — CLONIDINE HCL 0.3 MG PO TABS: 0.3000 mg | ORAL_TABLET | Freq: Three times a day (TID) | ORAL | Status: DC

## 2013-01-09 MED ORDER — METOPROLOL TARTRATE 25 MG PO TABS
25.0000 mg | ORAL_TABLET | Freq: Two times a day (BID) | ORAL | Status: DC
  Administered 2013-01-09 – 2013-01-11 (×5): 25 mg via ORAL
  Filled 2013-01-09 (×7): qty 1

## 2013-01-09 MED ORDER — LEVETIRACETAM 500 MG PO TABS
500.0000 mg | ORAL_TABLET | Freq: Two times a day (BID) | ORAL | Status: DC
  Administered 2013-01-09 – 2013-01-11 (×5): 500 mg via ORAL
  Filled 2013-01-09 (×7): qty 1

## 2013-01-09 MED ORDER — AMLODIPINE BESYLATE 10 MG PO TABS: 10.0000 mg | ORAL_TABLET | Freq: Every day | ORAL | Status: DC

## 2013-01-09 MED ORDER — SODIUM CHLORIDE 0.9 % IV SOLN
INTRAVENOUS | Status: DC
  Administered 2013-01-09: 17:00:00 via INTRAVENOUS
  Administered 2013-01-10: 75 mL/h via INTRAVENOUS

## 2013-01-09 MED ORDER — POTASSIUM CHLORIDE 10 MEQ/100ML IV SOLN
10.0000 meq | Freq: Once | INTRAVENOUS | Status: AC
  Administered 2013-01-09: 10 meq via INTRAVENOUS
  Filled 2013-01-09: qty 100

## 2013-01-09 MED ORDER — CLONIDINE HCL 0.3 MG PO TABS
0.3000 mg | ORAL_TABLET | Freq: Three times a day (TID) | ORAL | Status: DC
  Administered 2013-01-09 – 2013-01-11 (×9): 0.3 mg via ORAL
  Filled 2013-01-09 (×10): qty 1

## 2013-01-09 MED ORDER — ATORVASTATIN CALCIUM 40 MG PO TABS
40.0000 mg | ORAL_TABLET | Freq: Every evening | ORAL | Status: DC
  Administered 2013-01-09 – 2013-01-10 (×2): 40 mg via ORAL
  Filled 2013-01-09 (×3): qty 1

## 2013-01-09 MED ORDER — INSULIN ASPART 100 UNIT/ML ~~LOC~~ SOLN
0.0000 [IU] | Freq: Three times a day (TID) | SUBCUTANEOUS | Status: DC
  Administered 2013-01-09 – 2013-01-10 (×3): 3 [IU] via SUBCUTANEOUS
  Administered 2013-01-11: 8 [IU] via SUBCUTANEOUS
  Administered 2013-01-11: 5 [IU] via SUBCUTANEOUS

## 2013-01-09 MED ORDER — ADULT MULTIVITAMIN W/MINERALS CH
1.0000 | ORAL_TABLET | Freq: Every day | ORAL | Status: DC
  Administered 2013-01-09 – 2013-01-11 (×3): 1 via ORAL
  Filled 2013-01-09 (×3): qty 1

## 2013-01-09 MED ORDER — AMLODIPINE BESYLATE 10 MG PO TABS
10.0000 mg | ORAL_TABLET | Freq: Every day | ORAL | Status: DC
  Administered 2013-01-09 – 2013-01-11 (×3): 10 mg via ORAL
  Filled 2013-01-09 (×3): qty 1

## 2013-01-09 MED ORDER — IRBESARTAN 300 MG PO TABS
300.0000 mg | ORAL_TABLET | Freq: Every day | ORAL | Status: DC
  Administered 2013-01-09 – 2013-01-10 (×2): 300 mg via ORAL
  Filled 2013-01-09 (×3): qty 1

## 2013-01-09 NOTE — Progress Notes (Signed)
INITIAL NUTRITION ASSESSMENT  DOCUMENTATION CODES Per approved criteria  -Severe malnutrition in the context of chronic illness   INTERVENTION: Monitor magnesium, potassium, and phosphorus daily for at least 3 days and throughout diet advancement, MD to replete as needed, as pt is at risk for refeeding syndrome given recent profound/significant wt loss. If pt fails swallow evaluation, strongly recommend initiation of nutrition support given dx of severe malnutrition. Pt would benefit from oral nutrition supplements to help support weight maintenance once diet permits. RD to continue to follow nutrition care plan.  NUTRITION DIAGNOSIS: Inadequate oral intake related to inability to eat as evidenced by NPO status.   Goal: Advance diet vs initiation of nutrition support. Intake to meet at least 90% of estimated needs.  Monitor:  weight trends, lab trends, I/O's, diet advancement vs initiation of nutrition support  Reason for Assessment: Malnutrition Screening Tool  79 y.o. female  Admitting Dx: AMS  ASSESSMENT: PMHx significant for DM, HTN, HLD and recent subdural hematoma in Feb 2014 s/p craniotomy. Admitted with AMS and L facial droop x > 1 day.   Head CT WNL. Admitted for w/u of stroke vs PRESS/hypertensive emergency.  Per chart review of weight hx, pt with usual weight of 145 - 150 lb approximately 5-6 months ago. Pt is now down to 115 lb. This is a weight change of 23% and is significant for the time frame.   Pt very confused at this time. When asking to complete part of the physical exam, pt refuses.  Nutrition Focused Physical Exam:  Subcutaneous Fat:  Orbital Region: well nourished Upper Arm Region: moderate depletion Thoracic and Lumbar Region: n/a  Muscle:  Temple Region: moderate depletion Clavicle Bone Region: moderate depletion Clavicle and Acromion Bone Region: n/a Scapular Bone Region: n/a Dorsal Hand: n/a Patellar Region: moderate depletion Anterior  Thigh Region: n/a Posterior Calf Region: n/a  Edema: n/a  Pt meets criteria for severe MALNUTRITION in the context of chronic illness as evidenced by suspected intake of <75% x at least 1 month, 23% wt loss x 5-6 months and moderate muscle and fat mass loss.  Remains NPO at this time. SLP consult placed for tomorrow (8/7.)  Pt has required potassium repletion since admission.  Height: Ht Readings from Last 1 Encounters:  01/09/13 5\' 2"  (1.575 m)    Weight: Wt Readings from Last 1 Encounters:  01/09/13 115 lb 8.3 oz (52.4 kg)    Ideal Body Weight: 110 lb  % Ideal Body Weight: 105%  Wt Readings from Last 10 Encounters:  01/09/13 115 lb 8.3 oz (52.4 kg)  12/12/12 106 lb 7.7 oz (48.3 kg)  12/12/12 106 lb 7.7 oz (48.3 kg)  12/06/12 116 lb 6.4 oz (52.799 kg)  11/26/12 126 lb 6.4 oz (57.335 kg)  08/15/12 141 lb 9.6 oz (64.229 kg)  08/08/12 146 lb 9.7 oz (66.5 kg)  08/01/12 154 lb 15.7 oz (70.3 kg)  08/08/12 146 lb 9.7 oz (66.5 kg)  07/23/12 151 lb 3.8 oz (68.6 kg)    Usual Body Weight: 150 lb  % Usual Body Weight: 77%  BMI:  Body mass index is 21.12 kg/(m^2). WNL  Estimated Nutritional Needs: Kcal: 1200 - 1400 Protein: 46 - 57 grams Fluid: at least 1.5 liters daily  Skin:  intact  Diet Order: NPO  EDUCATION NEEDS: -No education needs identified at this time  No intake or output data in the 24 hours ending 01/09/13 1058  Last BM: 8/4  Labs:   Recent Labs Lab 01/08/13  1936 01/09/13 0610  NA 139 141  K 3.0* 3.1*  CL 106 106  CO2 25 25  BUN 34* 33*  CREATININE 3.11* 3.05*  CALCIUM 9.3 8.9  GLUCOSE 78 137*    CBG (last 3)   Recent Labs  01/08/13 1849 01/08/13 1924  GLUCAP 81 79    Scheduled Meds: . amLODipine  10 mg Oral Daily  . atorvastatin  40 mg Oral QPM  . cloNIDine  0.3 mg Oral TID  . heparin  5,000 Units Subcutaneous Q8H  . insulin aspart  0-15 Units Subcutaneous TID WC  . irbesartan  300 mg Oral QHS  . levETIRAcetam  500 mg  Oral BID  . metoprolol tartrate  25 mg Oral BID  . multivitamin with minerals  1 tablet Oral Daily  . polyethylene glycol  17 g Oral Daily    Continuous Infusions:   Past Medical History  Diagnosis Date  . Diabetes mellitus   . Hypertension   . Hyperlipidemia   . Pneumonia   . Subdural hematoma   . Arthritis   . DVT (deep venous thrombosis)   . Incontinence   . Renal disorder     chronic kidney dz stage III    Past Surgical History  Procedure Laterality Date  . Insertion of vena cava filter    . Craniotomy Right 07/23/2012    Procedure: CRANIOTOMY HEMATOMA EVACUATION SUBDURAL;  Surgeon: [name], MD;  Location: [facility];  Service: Neurosurgery;  Laterality: Right;  . Craniotomy N/A 08/06/2012    Procedure: CRANIOTOMY HEMATOMA EVACUATION SUBDURAL;  Surgeon: [name], MD;  Location: [facility];  Service: Neurosurgery;  Laterality: N/A;  Craniectomy for evacuation of subdural hematoma, implantation of bone flap in abdominal wall   . Brain surgery    . Craniotomy N/A 12/12/2012    Procedure: CRANIOTOMY BONE FLAP/PROSTHETIC PLATE retrieval of bone from abdomen;  Surgeon: [name], MD;  Location: [facility];  Service: Neurosurgery;  Laterality: N/A;  cranioplasty with retrieval of bone flap from abdominal wall pocket. Site is head and abdomen.    Jarold Motto MS, RD, LDN Pager: [phone] After-hours pager: [phone]

## 2013-01-09 NOTE — Progress Notes (Signed)
Subjective: No further events  Exam: Filed Vitals:   01/09/13 1558  BP: 162/55  Pulse:   Temp:   Resp:    Gen: In bed, NAD MS: Awake, Alert, oriented to person place, month, but not year "1944". Mildly dysarthric.  ZO:XWRUE, VFF, mild left facial droop.  Motor: 5/5 to confrontation, but has a mild left pronator drift.  Sensory:intact to LT   Impression: 79 yo F with new left facial droop and dysarthria. I am concerned for a right sided stroke, and MRI is pending. I suspect she has some baseline memory problems as well.   Recommendations: 1)MRI brain.  2) Further recs following MRI.   Ritta Slot, MD [facility] [phone]  If 7pm- 7am, please page neurology on call at [phone].

## 2013-01-09 NOTE — Progress Notes (Addendum)
Spoke with Dr. Leroy Kennedy regarding establishing parameters for BP management as well as Dr. Marin Shutter & Dr. Mitzi Hansen. Goals for BP are SBP 180.

## 2013-01-09 NOTE — ED Notes (Signed)
Pt given hydralazine IVP and nicardipine drip discontinued per admitting MD orders. Pt tolerated procedure well, pt has no decrease in neuro status assessed at this time. Pt repositioned and warm blankets given for comfort measures. Will continue to monitor pt.

## 2013-01-09 NOTE — Progress Notes (Signed)
Echo Lab  2D Echocardiogram completed.  Judee Hennick L Eisa Necaise, RDCS 01/09/2013 12:03 PM

## 2013-01-09 NOTE — Clinical Documentation Improvement (Signed)
THIS DOCUMENT IS NOT A PERMANENT PART OF THE MEDICAL RECORD  Please update your documentation with the medical record to reflect your response to this query. If you need help knowing how to do this please call [phone].  01/09/13  Dear Dr. Beacher May,   Noted in H&P patient with SBP 256 with diagnosis of "hypertensive emergency". This diagnosis will code to "benign HTN". Please clarify this term to more clearly show the patient's severity of illness and risk of mortality. Thank you.  Possible Clinical Conditions?  -  Hypertension - Accelerated Hypertension - Malignant Hypertension - Or Other Condition (please specify)   You may use possible, probable, or suspect with inpatient documentation. Possible, probable, suspected diagnoses MUST be documented at the time of discharge.  Reviewed: additional documentation in the medical record  Addend H+P to reflect "hypertensive emergency from malignant hypertension"  Thank You,  Beverley Fiedler RN BSN Clinical Documentation Specialist: [phone] Health Information Management [place]

## 2013-01-09 NOTE — Progress Notes (Signed)
[facility] PROGRESS NOTE  April [name] ZOX:[id number] DOB: July 05, 1921 DOA: 01/08/2013 PCP: Jeanann Lewandowsky, MD  Assessment/Plan: Acute encephalopathy  - Neuro following - Suspected PRES - MRI brain pending - PT/OT/SLP - Cont aggressive bp mgt.  CKD: - baseline Cr appears to be around 3, correlates to gfr of around 15 - would benefit from following up w/ Nephro - Consider gentle IVF  Hypokalemia  - replaced  DM  - putting patient on SSI AC/HS  DVT prophylaxis: - heparin subQ  Code Status: Full Family Communication: Pt in room (indicate person spoken with, relationship, and if by phone, the number) Disposition Plan: Pending  Consultants:  Neurology  HPI/Subjective: No acute events noted overnight  Objective: Filed Vitals:   01/09/13 0258 01/09/13 0600 01/09/13 1002 01/09/13 1200  BP: 196/74 212/71 149/58 154/66  Pulse: 68 55 63 62  Temp: 97.3 F (36.3 C) 97.7 F (36.5 C)    TempSrc: Oral Oral    Resp: 18 18  17   Height: 5\' 2"  (1.575 m)     Weight: 52.4 kg (115 lb 8.3 oz)     SpO2: 100% 100%  100%   No intake or output data in the 24 hours ending 01/09/13 1558 Filed Weights   01/09/13 0258  Weight: 52.4 kg (115 lb 8.3 oz)    Exam:   General:  Awake, in nad  Cardiovascular: regular, s1, s2  Respiratory: normal resp effort, no wheezing  Abdomen: soft, nondistended  Musculoskeletal: perfused, no clubbing   Data Reviewed: Basic Metabolic Panel:  Recent Labs Lab 01/08/13 1936 01/09/13 0610  NA 139 141  K 3.0* 3.1*  CL 106 106  CO2 25 25  GLUCOSE 78 137*  BUN 34* 33*  CREATININE 3.11* 3.05*  CALCIUM 9.3 8.9   Liver Function Tests:  Recent Labs Lab 01/08/13 1936  AST 16  ALT 8  ALKPHOS 84  BILITOT 0.2*  PROT 6.1  ALBUMIN 2.0*   No results found for this basename: LIPASE, AMYLASE,  in the last 168 hours No results found for this basename: AMMONIA,  in the last 168 hours CBC:  Recent Labs Lab 01/08/13 1936  WBC 4.9   NEUTROABS 2.8  HGB 8.2*  HCT 24.2*  MCV 79.1  PLT 285   Cardiac Enzymes:  Recent Labs Lab 01/08/13 1936  TROPONINI <0.30   BNP (last 3 results)  Recent Labs  07/02/12 2116  PROBNP 184.3   CBG:  Recent Labs Lab 01/08/13 1849 01/08/13 1924 01/09/13 1152  GLUCAP 81 79 168*    No results found for this or any previous visit (from the past 240 hour(s)).   Studies: Dg Chest 2 View  01/09/2013   *RADIOLOGY REPORT*  Clinical Data: Stroke.  CHEST - 2 VIEW  Comparison: 08/05 and 12/06/2012  Findings: Heart size and vascularity are normal.  Vessels are accentuated at the bases particularly on the right because of a shallow inspiration.  The focal area of consolidation at the left lung base has almost completely cleared.  This probably represented atelectasis.  IMPRESSION: Slight residual atelectasis at the left lung base.   Original Report Authenticated By: Francene Boyers, M.D.   Dg Abd 1 View  01/09/2013   *RADIOLOGY REPORT*  Clinical Data: Abdominal pain.  ABDOMEN - 1 VIEW  Comparison: 08/07/2012  Findings: IVC filter in place.  Bowel gas pattern is normal. Benign bone island in the left ilium.  Degenerative disc disease in the lower lumbar spine as well as degenerative arthritis of the  left sacroiliac joint.  No visible free air or free fluid.  IMPRESSION: Benign-appearing abdomen.   Original Report Authenticated By: Francene Boyers, M.D.   Ct Head (brain) Wo Contrast  01/08/2013   *RADIOLOGY REPORT*  Clinical Data: Altered mental status.  Recent subdural hematoma.  CT HEAD WITHOUT CONTRAST  Technique:  Contiguous axial images were obtained from the base of the skull through the vertex without contrast.  Comparison: 12/13/2012  Findings: There is no acute intracranial hemorrhage, mass lesion, or infarction.  The air and fluid in the subdural space deep to the cranioplasty of the right has diminished with secondary decreased mass effect upon the underlying brain. infarct as well as a  lacunar infarcts in the basal ganglia, all stable.  IMPRESSION: No acute intracranial abnormality.  Decreased air and fluid in the subdural space deep to the cranioplasty on the right.   Original Report Authenticated By: Francene Boyers, M.D.   Dg Chest Portable 1 View  01/08/2013   *RADIOLOGY REPORT*  Clinical Data: Altered mental status, hypertension  PORTABLE CHEST - 1 VIEW  Comparison: December 06, 2012  Findings: There is patchy consolidation of left lung base.  There is no pulmonary edema.  The mediastinal contours normal.  The heart size is probably mildly enlarged.  The soft tissues and osseous structures are stable.  IMPRESSION: Mild patchy consolidation of left lung base,suspicious for pneumonia.   Original Report Authenticated By: Sherian Rein, M.D.    Scheduled Meds: . amLODipine  10 mg Oral Daily  . atorvastatin  40 mg Oral QPM  . cloNIDine  0.3 mg Oral TID  . heparin  5,000 Units Subcutaneous Q8H  . insulin aspart  0-15 Units Subcutaneous TID WC  . irbesartan  300 mg Oral QHS  . levETIRAcetam  500 mg Oral BID  . metoprolol tartrate  25 mg Oral BID  . multivitamin with minerals  1 tablet Oral Daily  . polyethylene glycol  17 g Oral Daily   Continuous Infusions:   Active Problems:   Hypertensive emergency   Acute encephalopathy   Hypokalemia    Time spent:    CHIU, STEPHEN K  [facility] Pager [phone]. If 7PM-7AM, please contact night-coverage at [email or web address], password [facility] 01/09/2013, 3:58 PM  LOS: 1 day

## 2013-01-09 NOTE — ED Notes (Signed)
Pt reports having lower back pain, pt was repositioned To left side with extra pillow given and placed under lower back for comfort measures.

## 2013-01-09 NOTE — ED Notes (Signed)
Admitting MD at bedside for pt evaluation and plan of care updated. Pt verbalized understanding of plan of care , will continue to monitor pt.

## 2013-01-09 NOTE — H&P (Addendum)
[facility] History and Physical  Olamae Ferrara ZOX:[id number] DOB: 07/06/1921 DOA: 01/08/2013  Referring physician: ED PCP: Standley Dakins, MD   Chief Complaint: Left facial droop  HPI: April [name] is a 79 y.o. 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  Patient was brought to [facility] with new onset of L facial weakness since yesterday as well as increasing confusion with decreased responsiveness.  Patient's son stated that she was usually able to ambulate with walker at baseline but hasnt been able to do so and stopped eating as well yesterday.  In the ED she was noted to have an initial BP of 258/78, she was evaluated by neurology and findings were c/w L sided symptoms from a R brain syndrome (Stroke vs PRESS).  CT head was negative for SDH or acute stroke.  Hospitalist asked to admit.  Review of Systems: 12 systems reviewed and otherwise negative.  Past Medical History  Diagnosis Date  . Diabetes mellitus   . Hypertension   . Hyperlipidemia   . Pneumonia   . Subdural hematoma   . Arthritis   . DVT (deep venous thrombosis)   . Incontinence   . Renal disorder     chronic kidney dz stage III   Past Surgical History  Procedure Laterality Date  . Insertion of vena cava filter    . Craniotomy Right 07/23/2012    Procedure: CRANIOTOMY HEMATOMA EVACUATION SUBDURAL;  Surgeon: [name], MD;  Location: [facility];  Service: Neurosurgery;  Laterality: Right;  . Craniotomy N/A 08/06/2012    Procedure: CRANIOTOMY HEMATOMA EVACUATION SUBDURAL;  Surgeon: [name], MD;  Location: [facility];  Service: Neurosurgery;  Laterality: N/A;  Craniectomy for evacuation of subdural hematoma, implantation of bone flap in abdominal wall   . Brain surgery    . Craniotomy N/A 12/12/2012    Procedure: CRANIOTOMY BONE  FLAP/PROSTHETIC PLATE retrieval of bone from abdomen;  Surgeon: [name], MD;  Location: [facility];  Service: Neurosurgery;  Laterality: N/A;  cranioplasty with retrieval of bone flap from abdominal wall pocket. Site is head and abdomen.   Social History:  reports that she has never smoked. She has never used smokeless tobacco. She reports that she does not drink alcohol or use illicit drugs.  No Known Allergies  Family History  Problem Relation Age of Onset  . Diabetes Mellitus II Son      Prior to Admission medications   Medication Sig Start Date End Date Taking? Authorizing Provider  amLODipine (NORVASC) 10 MG tablet Take 1 tablet (10 mg total) by mouth daily. 08/17/12  Yes Daniel J Angiulli, PA-C  atorvastatin (LIPITOR) 40 MG tablet Take 1 tablet (40 mg total) by mouth every evening. 08/17/12  Yes Daniel J Angiulli, PA-C  cloNIDine (CATAPRES) 0.1 MG tablet Take 3 tablets (0.3 mg total) by mouth 3 (three) times daily. 11/26/12  Yes Alison Murray, MD  fish oil-omega-3 fatty acids 1000 MG capsule Take 2 capsules (2 g total) by mouth daily. 11/27/12  Yes Alison Murray, MD  glucose 4 GM chewable tablet Chew 16 g by mouth as needed for low blood sugar.   Yes Historical Provider, MD  irbesartan (AVAPRO) 300 MG tablet Take 1 tablet (300 mg total) by mouth at bedtime. 11/08/12  Yes Reuben Likes, MD  levETIRAcetam [facility])  500 MG tablet Take 1 tablet (500 mg total) by mouth 2 (two) times daily. 08/17/12  Yes Daniel J Angiulli, PA-C  metoprolol tartrate (LOPRESSOR) 25 MG tablet Take 1 tablet (25 mg total) by mouth 2 (two) times daily. 01/01/13  Yes Nishant Dhungel, MD  Multiple Vitamin (MULTIVITAMIN WITH MINERALS) TABS Take 1 tablet by mouth daily. 08/17/12  Yes Daniel J Angiulli, PA-C  polyethylene glycol powder (MIRALAX) powder Take 17 g by mouth daily. 11/08/12  Yes Reuben Likes, MD  sitaGLIPtin (JANUVIA) 50 MG tablet Take 1 tablet (50 mg total) by mouth daily. 08/17/12  Yes Mcarthur Rossetti Angiulli,  PA-C   Physical Exam: Filed Vitals:   01/09/13 0220  BP: 172/78  Pulse: 61  Temp:   Resp: 9     General:  NAD, resting comfortably in bed  Eyes: PEERLA EOMI  ENT: mucous membranes moist  Neck: supple w/o JVD  Cardiovascular: RRR w/o MRG  Respiratory: CTA B  Abdomen: soft, nt, nd, bs+  Skin: no rash nor lesion  Musculoskeletal: MAE, full ROM all 4 extremities  Psychiatric: very "tired", patient does clearly understand whats going on and seems to understand that we are concerned she may have had a stroke  Neurologic: AAOx3, Isolated L facial weakness on physical exam.   Labs on Admission:  Basic Metabolic Panel:  Recent Labs Lab 01/08/13 1936  NA 139  K 3.0*  CL 106  CO2 25  GLUCOSE 78  BUN 34*  CREATININE 3.11*  CALCIUM 9.3   Liver Function Tests:  Recent Labs Lab 01/08/13 1936  AST 16  ALT 8  ALKPHOS 84  BILITOT 0.2*  PROT 6.1  ALBUMIN 2.0*   No results found for this basename: LIPASE, AMYLASE,  in the last 168 hours No results found for this basename: AMMONIA,  in the last 168 hours CBC:  Recent Labs Lab 01/08/13 1936  WBC 4.9  NEUTROABS 2.8  HGB 8.2*  HCT 24.2*  MCV 79.1  PLT 285   Cardiac Enzymes:  Recent Labs Lab 01/08/13 1936  TROPONINI <0.30    BNP (last 3 results)  Recent Labs  07/02/12 2116  PROBNP 184.3   CBG:  Recent Labs Lab 01/08/13 1849 01/08/13 1924  GLUCAP 81 79    Radiological Exams on Admission: Ct Head (brain) Wo Contrast  01/08/2013   *RADIOLOGY REPORT*  Clinical Data: Altered mental status.  Recent subdural hematoma.  CT HEAD WITHOUT CONTRAST  Technique:  Contiguous axial images were obtained from the base of the skull through the vertex without contrast.  Comparison: 12/13/2012  Findings: There is no acute intracranial hemorrhage, mass lesion, or infarction.  The air and fluid in the subdural space deep to the cranioplasty of the right has diminished with secondary decreased mass effect upon the  underlying brain. infarct as well as a lacunar infarcts in the basal ganglia, all stable.  IMPRESSION: No acute intracranial abnormality.  Decreased air and fluid in the subdural space deep to the cranioplasty on the right.   Original Report Authenticated By: Francene Boyers, M.D.   Dg Chest Portable 1 View  01/08/2013   *RADIOLOGY REPORT*  Clinical Data: Altered mental status, hypertension  PORTABLE CHEST - 1 VIEW  Comparison: December 06, 2012  Findings: There is patchy consolidation of left lung base.  There is no pulmonary edema.  The mediastinal contours normal.  The heart size is probably mildly enlarged.  The soft tissues and osseous structures are stable.  IMPRESSION: Mild patchy consolidation of  left lung base,suspicious for pneumonia.   Original Report Authenticated By: Sherian Rein, M.D.    EKG: Independently reviewed.  Assessment/Plan Active Problems:   CKD (chronic kidney disease) stage 3, GFR 30-59 ml/min   Hypertensive emergency   Acute encephalopathy   Hypokalemia   Malignant hypertension   1. Acute encephalopathy - stroke vs PRESS (hypertensive emergency from malignant hypertension), unsure if IVC filter is MRI compatible so have ordered repeat CT brain for tomorrow at this time (MRI would be a better test for evaluating for PRESS if her IVC is determined to be compatible).  Neurology suspicious of a R brain syndrome.  Currently on stroke pathway at this time, PT/OT/SLP ordered.  BP goal = 180 at this time.  Have spoken with Dr. Leroy Kennedy and he feels that I should continue her BP meds as this is more likely to be PRESS / hypertensive emergency than new stroke so I have done this.  Hydralazine PRN has been started as well on patient. 2. Hypokalemia - replacing, repeat bmp in am. 3. DM - putting patient on SSI AC/HS    Code Status: Full Code (must indicate code status--if unknown or must be presumed, indicate so) Family Communication: No family in room (indicate person spoken with, if  applicable, with phone number if by telephone) Disposition Plan: Admit to inpatient (indicate anticipated LOS)  Time spent: 70 min  Charlise Giovanetti M. [facility] Pager [phone]  If 7PM-7AM, please contact night-coverage [email or web address] Password [email or web address] 01/09/2013, 2:57 AM

## 2013-01-09 NOTE — Progress Notes (Signed)
*  PRELIMINARY RESULTS* Vascular Ultrasound Carotid Duplex (Doppler) has been completed.  Preliminary findings: Bilateral:  1- 39% ICA stenosis.  Vertebral artery flow is antegrade.      Farrel Demark, RDMS, RVT  01/09/2013, 10:50 AM

## 2013-01-10 ENCOUNTER — Inpatient Hospital Stay (HOSPITAL_COMMUNITY): Payer: Medicare Other

## 2013-01-10 LAB — GLUCOSE, CAPILLARY
Glucose-Capillary: 157 mg/dL — ABNORMAL HIGH (ref 70–99)
Glucose-Capillary: 171 mg/dL — ABNORMAL HIGH (ref 70–99)
Glucose-Capillary: 212 mg/dL — ABNORMAL HIGH (ref 70–99)

## 2013-01-10 MED ORDER — SODIUM CHLORIDE 0.9 % IV SOLN: 500.0000 mg | Freq: Two times a day (BID) | INTRAVENOUS | Status: DC

## 2013-01-10 MED ORDER — METOPROLOL TARTRATE 1 MG/ML IV SOLN: 5.0000 mg | Freq: Four times a day (QID) | INTRAVENOUS | Status: DC

## 2013-01-10 NOTE — Progress Notes (Signed)
[facility] PROGRESS NOTE  Atiyana Welte ZOX:[id number] DOB: Jun 28, 1921 DOA: 01/08/2013 PCP: Jeanann Lewandowsky, MD  Assessment/Plan: Acute encephalopathy  - Neuro following  - Suspected PRES vs CVA - MRI brain pending  - PT/OT/SLP  - Cont aggressive bp mgt.  CKD:  - baseline Cr appears to be around 3, correlates to gfr of around 15  - would benefit from following up w/ Nephro  - Gentle IVF  Hypokalemia  - replaced  DM  - putting patient on SSI AC/HS  DVT prophylaxis:  - heparin subQ HTN: - remains suboptimal, albeit improved today - Per staff, pt's family reportedly held BP meds prior to admit, concerned that BP meds were causing delerium at home  Code Status: Full Family Communication: Pt in room (indicate person spoken with, relationship, and if by phone, the number) Disposition Plan: Pending  Consultants:  Neurology  HPI/Subjective: No acute events noted overnight  Objective: Filed Vitals:   01/09/13 1200 01/09/13 1558 01/09/13 2000 01/10/13 0444  BP: 154/66 162/55 195/69 188/71  Pulse: 62  51 57  Temp:   97.3 F (36.3 C) 97.3 F (36.3 C)  TempSrc:   Axillary Oral  Resp: 17   18  Height:      Weight:      SpO2: 100%  100% 100%   No intake or output data in the 24 hours ending 01/10/13 1131 Filed Weights   01/09/13 0258  Weight: 52.4 kg (115 lb 8.3 oz)    Exam:   General:  Awake, in nad  Cardiovascular: regular, s1, s2  Respiratory: normal resp effort, no wheezing  Abdomen: soft, nondistended  Musculoskeletal: perfused, no clubbing   Data Reviewed: Basic Metabolic Panel:  Recent Labs Lab 01/08/13 1936 01/09/13 0610  NA 139 141  K 3.0* 3.1*  CL 106 106  CO2 25 25  GLUCOSE 78 137*  BUN 34* 33*  CREATININE 3.11* 3.05*  CALCIUM 9.3 8.9   Liver Function Tests:  Recent Labs Lab 01/08/13 1936  AST 16  ALT 8  ALKPHOS 84  BILITOT 0.2*  PROT 6.1  ALBUMIN 2.0*   No results found for this basename: LIPASE, AMYLASE,  in the  last 168 hours No results found for this basename: AMMONIA,  in the last 168 hours CBC:  Recent Labs Lab 01/08/13 1936  WBC 4.9  NEUTROABS 2.8  HGB 8.2*  HCT 24.2*  MCV 79.1  PLT 285   Cardiac Enzymes:  Recent Labs Lab 01/08/13 1936  TROPONINI <0.30   BNP (last 3 results)  Recent Labs  07/02/12 2116  PROBNP 184.3   CBG:  Recent Labs Lab 01/08/13 1924 01/09/13 1152 01/09/13 1711 01/09/13 2022 01/10/13 0739  GLUCAP 79 168* 87 106* 157*    No results found for this or any previous visit (from the past 240 hour(s)).   Studies: Dg Chest 2 View  01/09/2013   *RADIOLOGY REPORT*  Clinical Data: Stroke.  CHEST - 2 VIEW  Comparison: 08/05 and 12/06/2012  Findings: Heart size and vascularity are normal.  Vessels are accentuated at the bases particularly on the right because of a shallow inspiration.  The focal area of consolidation at the left lung base has almost completely cleared.  This probably represented atelectasis.  IMPRESSION: Slight residual atelectasis at the left lung base.   Original Report Authenticated By: Francene Boyers, M.D.   Dg Abd 1 View  01/09/2013   *RADIOLOGY REPORT*  Clinical Data: Abdominal pain.  ABDOMEN - 1 VIEW  Comparison: 08/07/2012  Findings: IVC filter in place.  Bowel gas pattern is normal. Benign bone island in the left ilium.  Degenerative disc disease in the lower lumbar spine as well as degenerative arthritis of the left sacroiliac joint.  No visible free air or free fluid.  IMPRESSION: Benign-appearing abdomen.   Original Report Authenticated By: Francene Boyers, M.D.   Ct Head (brain) Wo Contrast  01/08/2013   *RADIOLOGY REPORT*  Clinical Data: Altered mental status.  Recent subdural hematoma.  CT HEAD WITHOUT CONTRAST  Technique:  Contiguous axial images were obtained from the base of the skull through the vertex without contrast.  Comparison: 12/13/2012  Findings: There is no acute intracranial hemorrhage, mass lesion, or infarction.  The air  and fluid in the subdural space deep to the cranioplasty of the right has diminished with secondary decreased mass effect upon the underlying brain. infarct as well as a lacunar infarcts in the basal ganglia, all stable.  IMPRESSION: No acute intracranial abnormality.  Decreased air and fluid in the subdural space deep to the cranioplasty on the right.   Original Report Authenticated By: Francene Boyers, M.D.   Dg Chest Portable 1 View  01/08/2013   *RADIOLOGY REPORT*  Clinical Data: Altered mental status, hypertension  PORTABLE CHEST - 1 VIEW  Comparison: December 06, 2012  Findings: There is patchy consolidation of left lung base.  There is no pulmonary edema.  The mediastinal contours normal.  The heart size is probably mildly enlarged.  The soft tissues and osseous structures are stable.  IMPRESSION: Mild patchy consolidation of left lung base,suspicious for pneumonia.   Original Report Authenticated By: Sherian Rein, M.D.    Scheduled Meds: . amLODipine  10 mg Oral Daily  . atorvastatin  40 mg Oral QPM  . cloNIDine  0.3 mg Oral TID  . heparin  5,000 Units Subcutaneous Q8H  . insulin aspart  0-15 Units Subcutaneous TID WC  . irbesartan  300 mg Oral QHS  . levETIRAcetam  500 mg Oral BID  . metoprolol tartrate  25 mg Oral BID  . multivitamin with minerals  1 tablet Oral Daily  . polyethylene glycol  17 g Oral Daily   Continuous Infusions: . sodium chloride 75 mL/hr (01/10/13 0347)    Active Problems:   CKD (chronic kidney disease) stage 3, GFR 30-59 ml/min   Hypertensive emergency   Acute encephalopathy   Hypokalemia   Malignant hypertension    Time spent:    CHIU, STEPHEN K  [facility] Pager [phone]. If 7PM-7AM, please contact night-coverage at [email or web address], password [email or web address] 01/10/2013, 11:31 AM  LOS: 2 days

## 2013-01-10 NOTE — Evaluation (Signed)
Clinical/Bedside Swallow Evaluation Patient Details  Name: Ashtyn Meland MRN: [id number] Date of Birth: 10/30/1921  Today's Date: 01/10/2013 Time: 4098-1191 SLP Time Calculation (min): 10 min  Past Medical History:  Past Medical History  Diagnosis Date  . Diabetes mellitus   . Hypertension   . Hyperlipidemia   . Pneumonia   . Subdural hematoma   . Arthritis   . DVT (deep venous thrombosis)   . Incontinence   . Renal disorder     chronic kidney dz stage III   Past Surgical History:  Past Surgical History  Procedure Laterality Date  . Insertion of vena cava filter    . Craniotomy Right 07/23/2012    Procedure: CRANIOTOMY HEMATOMA EVACUATION SUBDURAL;  Surgeon: [name], MD;  Location: [facility];  Service: Neurosurgery;  Laterality: Right;  . Craniotomy N/A 08/06/2012    Procedure: CRANIOTOMY HEMATOMA EVACUATION SUBDURAL;  Surgeon: [name], MD;  Location: [facility];  Service: Neurosurgery;  Laterality: N/A;  Craniectomy for evacuation of subdural hematoma, implantation of bone flap in abdominal wall   . Brain surgery    . Craniotomy N/A 12/12/2012    Procedure: CRANIOTOMY BONE FLAP/PROSTHETIC PLATE retrieval of bone from abdomen;  Surgeon: [name], MD;  Location: [facility];  Service: Neurosurgery;  Laterality: N/A;  cranioplasty with retrieval of bone flap from abdominal wall pocket. Site is head and abdomen.   HPI:  April [name] is a 79 y.o. 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  Patient was [facility] with new onset of L facial weakness since yesterday as well as increasing confusion with decreased responsiveness.  Patient's son stated that she was usually able to ambulate with walker at baseline but hasnt been able to do so and stopped eating as well yesterday.  Now with new  left facial asymmetry and dysarthria; MRI pending.  Pt with Inpatient Rehab admission in Feb of 2014: followed by SLP at that time with hx of cognitive deficits, mild oral dysphagia with no aspiration per MBS during that admission.   Assessment / Plan / Recommendation Clinical Impression  Pt presents with functional oropharyngeal swallow marked by adequate mastication, swift swallow trigger, and no signs of compromised airway protection.  Recommend allowing regular consistency diet, thin liquids, meds whole with liquid (give with puree if liquids elicit cough.)  SLP to sign-off.           Diet Recommendation Regular;Thin liquid   Liquid Administration via: Cup;Straw Medication Administration: Whole meds with liquid Supervision: Patient able to self feed;Intermittent supervision to cue for compensatory strategies Postural Changes and/or Swallow Maneuvers: Seated upright 90 degrees    Other  Recommendations Oral Care Recommendations: Oral care BID   Follow Up Recommendations  None     Swallow Study Prior Functional Status       General Date of Onset: 01/08/13 HType of Study: Bedside swallow evaluation Diet Prior to this Study: Regular;Thin liquids Temperature Spikes Noted: No Respiratory Status: Room air Behavior/Cognition: Alert;Confused Oral Cavity - Dentition: Missing dentition Self-Feeding Abilities: Able to feed self Patient Positioning: Upright in chair Baseline Vocal Quality: Clear Volitional Cough: Strong Volitional Swallow: Able to elicit    Oral/Motor/Sensory Function Overall Oral Motor/Sensory Function:  (decreased symmetry CNVII left)   Ice Chips Ice chips: Within functional limits Presentation: Spoon;Self Fed  Thin Liquid Thin Liquid: Within functional limits Presentation: Cup;Straw;Self Fed    Nectar Thick Nectar Thick Liquid: Not tested   Honey Thick Honey Thick Liquid: Not tested   Puree Puree: Within functional limits Presentation: Self Fed;Spoon    Solid   GO    Solid: Within functional limits Presentation: Self Fed      Bertrum Helmstetter L. Samson Frederic, [place] CCC/SLP Pager [phone]  Blenda Mounts [name] 01/10/2013,11:15 AM

## 2013-01-10 NOTE — Progress Notes (Signed)
Subjective: Became confused overnight  Exam: Filed Vitals:   01/10/13 0444  BP: 188/71  Pulse: 57  Temp: 97.3 F (36.3 C)  Resp: 18   Gen: In bed, NAD MS: Awake, Alert, not oriented this morning [id number], VFF, mild left facial droop appears improved Motor: 5/5 to confrontation,  Sensory:intact to LT   Impression: 79 yo F with new left facial droop and dysarthria. I am concerned for a right sided stroke, and MRI is pending. I suspect she has some baseline memory problems as well, and this could contribute to a delirium in the setting of change to environment which is what I suspect happened last night.  Recommendations: 1) MRI brain.  2) Further recs following MRI.   Ritta Slot, MD [facility] [phone]  If 7pm- 7am, please page neurology on call at [phone].

## 2013-01-10 NOTE — Progress Notes (Signed)
Patient evaluated for community based chronic disease management services with [facility] Care Management Program as a benefit of patient's [employer].  [facility] has attempted to reach out the patients's son [name] x 3 since her last inpatient discharge on 7.18.14.  We planned to meet with the son and patient at her appointment on 8.27.14 in the [facility] because these telephonic outreach measures where unsuccessful.  In at bedside to visit with the patient this afternoon.  She is pleasant and conversant but very weak.  Noted that SNF placement is being considered.  Reached out to Theresia Bough LCSW for assistance with engaging the son.  Will await her call back before attempting further engagement to reduce the risk of family confusion.  Left contact information and [facility] literature at bedside.  Made Gerrianne Scale RN Inpatient Case Manager aware that [facility] Care Management following and plan. Of note, [facility] Care Management services does not replace or interfere with any services that are arranged by inpatient case management or social work.  For additional questions or referrals please contact Anibal Henderson BSN RN [facility] Liaison at [phone].

## 2013-01-10 NOTE — Evaluation (Signed)
Speech Language Pathology Evaluation Patient Details Name: April [name] MRN: [id number] DOB: 1921/10/07 Today's Date: 01/10/2013 Time: 4098-1191 SLP Time Calculation (min): 20 min  Problem List:  Patient Active Problem List   Diagnosis Date Noted  . Hypertensive emergency 01/09/2013  . Acute encephalopathy 01/09/2013  . Hypokalemia 01/09/2013  . Malignant hypertension 01/09/2013  . Subdural hemorrhage 08/14/2012  . Subdural hematoma 07/30/2012  . DVT (deep venous thrombosis) 07/04/2012  . CKD (chronic kidney disease) stage 3, GFR 30-59 ml/min 07/04/2012  . Lower extremity edema 07/03/2012  . ARF (acute renal failure) 07/03/2012  . HTN (hypertension) 07/03/2012  . SDH (subdural hematoma) 06/21/2012  . Diabetes mellitus 06/20/2012  . Hyperlipidemia 06/20/2012   Past Medical History:  Past Medical History  Diagnosis Date  . Diabetes mellitus   . Hypertension   . Hyperlipidemia   . Pneumonia   . Subdural hematoma   . Arthritis   . DVT (deep venous thrombosis)   . Incontinence   . Renal disorder     chronic kidney dz stage III   Past Surgical History:  Past Surgical History  Procedure Laterality Date  . Insertion of vena cava filter    . Craniotomy Right 07/23/2012    Procedure: CRANIOTOMY HEMATOMA EVACUATION SUBDURAL;  Surgeon: [name], MD;  Location: [facility];  Service: Neurosurgery;  Laterality: Right;  . Craniotomy N/A 08/06/2012    Procedure: CRANIOTOMY HEMATOMA EVACUATION SUBDURAL;  Surgeon: [name], MD;  Location: [facility];  Service: Neurosurgery;  Laterality: N/A;  Craniectomy for evacuation of subdural hematoma, implantation of bone flap in abdominal wall   . Brain surgery    . Craniotomy N/A 12/12/2012    Procedure: CRANIOTOMY BONE FLAP/PROSTHETIC PLATE retrieval of bone from abdomen;  Surgeon: [name], MD;  Location: [facility];  Service: Neurosurgery;  Laterality: N/A;  cranioplasty with retrieval of bone flap from abdominal  wall pocket. Site is head and abdomen.   HPI:  April [name] is a 79 y.o. 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  Patient was brought to [facility] with new onset of L facial weakness since yesterday as well as increasing confusion with decreased responsiveness.  Patient's son stated that she was usually able to ambulate with walker at baseline but hasnt been able to do so and stopped eating as well yesterday.  Now with new left facial asymmetry and dysarthria; MRI pending.  Pt with Inpatient Rehab admission in Feb of 2014: followed by SLP at that time with hx of cognitive deficits, mild oral dysphagia with no aspiration per MBS during that admission.   Assessment / Plan / Recommendation Clinical Impression  Pt presents with decreased MS from baseline, MRI pending.  Currently with disorientation x3, pleasantly confused, decreased judgement, problem-solving. Fluent expression with no language deficits.  Speech is intelligible.  Short-term recall deficits at baseline.   Will follow for dx/plan.     SLP Assessment  Patient needs continued Speech Lanaguage Pathology Services    Follow Up Recommendations  Other (comment) (tba)    Frequency and Duration min 1 x/week  1 week      SLP Goals  SLP Goals Potential to Achieve Goals: Good Potential Considerations: Ability to learn/carryover information SLP Goal #1: Pt/family will utilize compensatory strategies to facilitate orientation to surroundings with mod assist.  SLP Evaluation Prior Functioning  Cognitive/Linguistic Baseline: Baseline deficits Type of Home: House  Lives With: Son Available Help at Discharge: Family;Available PRN/intermittently Vocation: Retired   IT consultant  Overall Cognitive Status: Impaired/Different from baseline Arousal/Alertness:  Awake/alert Orientation Level: Oriented to person;Disoriented to place;Oriented to situation;Disoriented to time Attention: Sustained Sustained Attention: Appears intact Memory: Impaired Memory Impairment: Storage deficit;Retrieval deficit Awareness: Impaired Awareness Impairment: Intellectual impairment Problem Solving: Impaired Problem Solving Impairment: Verbal basic;Functional basic Executive Function: Reasoning;Organizing;Decision Making;Self Monitoring Reasoning: Impaired Reasoning Impairment: Verbal basic;Functional basic Organizing: Impaired Organizing Impairment: Verbal basic;Functional basic Decision Making: Impaired Decision Making Impairment: Verbal basic;Functional basic Self Monitoring: Impaired Self Monitoring Impairment: Verbal basic;Functional basic Behaviors: Impulsive Safety/Judgment: Appears intact    Comprehension  Auditory Comprehension Overall Auditory Comprehension: Appears within functional limits for tasks assessed Reading Comprehension Reading Status: Unable to assess (comment)    Expression Expression Primary Mode of Expression: Verbal Verbal Expression Overall Verbal Expression: Appears within functional limits for tasks assessed   Oral / Motor Oral Motor/Sensory Function Overall Oral Motor/Sensory Function:  (see bedside swallow) Motor Speech Overall Motor Speech: Appears within functional limits for tasks assessed   GO     April [name] [name] 01/10/2013, 11:29 AM

## 2013-01-10 NOTE — Progress Notes (Signed)
PT Cancellation Note  Patient Details Name: April [name] MRN: [id number] DOB: [birth year]   Cancelled Treatment:    Reason Eval/Treat Not Completed: Patient not medically ready; patient on bedrest with MRI pending and suspected stroke.  Will await test results and updated activity orders prior to eval.  Thanks.   WYNN,CYNDI 01/10/2013, 9:15 AM

## 2013-01-11 LAB — COMPREHENSIVE METABOLIC PANEL
ALT: 12 U/L (ref 0–35)
BUN: 34 mg/dL — ABNORMAL HIGH (ref 6–23)
Calcium: 8.6 mg/dL (ref 8.4–10.5)
Creatinine, Ser: 2.81 mg/dL — ABNORMAL HIGH (ref 0.50–1.10)
GFR calc Af Amer: 16 mL/min — ABNORMAL LOW (ref >90)
Glucose, Bld: 321 mg/dL — ABNORMAL HIGH (ref 70–99)
Sodium: 140 mEq/L (ref 135–145)
Total Protein: 5.3 g/dL — ABNORMAL LOW (ref 6.0–8.3)

## 2013-01-11 LAB — GLUCOSE, CAPILLARY
Glucose-Capillary: 281 mg/dL — ABNORMAL HIGH (ref 70–99)
Glucose-Capillary: 280 mg/dL — ABNORMAL HIGH (ref 70–99)

## 2013-01-11 LAB — CBC
HCT: 25.8 % — ABNORMAL LOW (ref 36.0–46.0)
Hemoglobin: 8.5 g/dL — ABNORMAL LOW (ref 12.0–15.0)
RBC: 3.21 MIL/uL — ABNORMAL LOW (ref 3.87–5.11)
WBC: 4.4 10*3/uL (ref 4.0–10.5)

## 2013-01-11 MED ORDER — HYDRALAZINE HCL 10 MG PO TABS: 10.0000 mg | ORAL_TABLET | Freq: Three times a day (TID) | ORAL | Status: DC

## 2013-01-11 MED ORDER — ASPIRIN EC 81 MG PO TBEC
81.0000 mg | DELAYED_RELEASE_TABLET | Freq: Every day | ORAL | Status: DC
  Filled 2013-01-11: qty 1

## 2013-01-11 MED ORDER — HYDRALAZINE HCL 10 MG PO TABS
10.0000 mg | ORAL_TABLET | Freq: Three times a day (TID) | ORAL | Status: DC
  Administered 2013-01-11: 10 mg via ORAL
  Filled 2013-01-11 (×3): qty 1

## 2013-01-11 MED ORDER — LEVETIRACETAM 750 MG PO TABS
750.0000 mg | ORAL_TABLET | Freq: Two times a day (BID) | ORAL | Status: DC
  Filled 2013-01-11: qty 1

## 2013-01-11 MED ORDER — LEVETIRACETAM 750 MG PO TABS: 750.0000 mg | ORAL_TABLET | Freq: Two times a day (BID) | ORAL | Status: DC

## 2013-01-11 NOTE — Progress Notes (Signed)
Subjective: Patient awake and alert.  Able to follow commands.  Objective: Current vital signs: BP 188/77  Pulse 57  Temp(Src) 97.3 F (36.3 C) (Oral)  Resp 18  Ht 5\' 2"  (1.575 m)  Wt 55.566 kg (122 lb 8 oz)  BMI 22.4 kg/m2  SpO2 98% Vital signs in last 24 hours: Temp:  [97.3 F (36.3 C)-97.8 F (36.6 C)] 97.3 F (36.3 C) (08/08 0812) Pulse Rate:  [57-72] 57 (08/08 1011) Resp:  [16-18] 18 (08/07 1900) BP: (157-195)/(38-81) 188/77 mmHg (08/08 1011) SpO2:  [96 %-100 %] 98 % (08/08 0812) Weight:  [55.566 kg (122 lb 8 oz)] 55.566 kg (122 lb 8 oz) (08/08 0400)  Intake/Output from previous day: 08/07 0701 - 08/08 0700 In: 600 [P.O.:600] Out: -  Intake/Output this shift: Total I/O In: 240 [P.O.:240] Out: -  Nutritional status: Carb Control  Neurologic Exam: Mental Status: Alert.  Speech fluent without evidence of aphasia.  Able to follow simple commands without difficulty. Cranial Nerves: II: Discs flat bilaterally; Pupils equal, round, reactive to light and accommodation III,IV, VI: ptosis not present, extra-ocular motions intact bilaterally V,VII: decrease in right NLF, facial light touch sensation normal bilaterally VIII: hearing normal bilaterally IX,X: gag reflex present XI: bilateral shoulder shrug XII: midline tongue extension Motor: Lifts all extremities against gravity  Lab Results: Basic Metabolic Panel:  Recent Labs Lab 01/08/13 1936 01/09/13 0610 01/11/13 0655  NA 139 141 140  K 3.0* 3.1* 3.6  CL 106 106 107  CO2 25 25 24   GLUCOSE 78 137* 321*  BUN 34* 33* 34*  CREATININE 3.11* 3.05* 2.81*  CALCIUM 9.3 8.9 8.6    Liver Function Tests:  Recent Labs Lab 01/08/13 1936 01/11/13 0655  AST 16 19  ALT 8 12  ALKPHOS 84 107  BILITOT 0.2* 0.1*  PROT 6.1 5.3*  ALBUMIN 2.0* 1.9*   No results found for this basename: LIPASE, AMYLASE,  in the last 168 hours No results found for this basename: AMMONIA,  in the last 168 hours  CBC:  Recent  Labs Lab 01/08/13 1936 01/11/13 0655  WBC 4.9 4.4  NEUTROABS 2.8  --   HGB 8.2* 8.5*  HCT 24.2* 25.8*  MCV 79.1 80.4  PLT 285 307    Cardiac Enzymes:  Recent Labs Lab 01/08/13 1936  TROPONINI <0.30    Lipid Panel:  Recent Labs Lab 01/09/13 0610  CHOL 233*  TRIG 190*  HDL 45  CHOLHDL 5.2  VLDL 38  LDLCALC 161*    CBG:  Recent Labs Lab 01/10/13 0739 01/10/13 1123 01/10/13 1658 01/10/13 2059 01/11/13 0810  GLUCAP 157* 171* 232* 212* 281*    Microbiology: Results for orders placed during the hospital encounter of 12/12/12  URINE CULTURE     Status: None   Collection Time    12/13/12  7:18 PM      Result Value Range Status   Specimen Description URINE, CATHETERIZED   Final   Special Requests NONE   Final   Culture  Setup Time 12/13/2012 21:12   Final   Colony Count NO GROWTH   Final   Culture NO GROWTH   Final   Report Status 12/14/2012 FINAL   Final    Coagulation Studies:  Recent Labs  01/08/13 1936  LABPROT 12.9  INR 0.99    Imaging: Mr Brain Wo Contrast  01/10/2013   *RADIOLOGY REPORT*  Clinical Data: 79 year old female with left facial weakness, confusion.  History of brain surgery for subdural hematoma.  MRI  HEAD WITHOUT CONTRAST  Technique:  Multiplanar, multiecho pulse sequences of the brain and surrounding structures were obtained according to standard protocol without intravenous contrast.  Comparison: Head CTs 01/08/2013 and earlier.  Findings: Sequelae of right lateral craniectomy with flap replacement as demonstrated on prior studies.  Mild postoperative extra-axial collection and/or dural thickening underlying the surgical site. No acute intracranial hemorrhage identified.  No midline shift, mass effect, or evidence of mass lesion.  No restricted diffusion to suggest acute infarction.  Major intracranial vascular flow voids are preserved.  Confluent cerebral white matter T2 and FLAIR hyperintensity plus mild cortical encephalomalacia at  the anterior right frontal operculum.  Cerebral volume loss.  Occasional chronic micro hemorrhages (left cerebellum series 7 image 8).  Moderate T2 heterogeneity in the deep gray matter nuclei, favor mostly related to perivascular spaces. Brainstem and cerebellum within normal limits for age.  Deep sella turcica.  Negative cervicomedullary junction.  Degenerative changes in the visualized cervical spine.  Postoperative changes to the globes.  Minor paranasal sinus mucosal thickening.  Mild left mastoid effusion.  No acute scalp soft tissue changes. Visualized bone marrow signal is within normal limits.  IMPRESSION: 1. No acute intracranial abnormality. 2.  Satisfactory postoperative appearance status post right convexity craniectomy with bone flap replacement.  Mild underlying postoperative collection and/or dural thickening. 3.  White matter signal changes and mild right frontal operculum encephalomalacia, nonspecific and could be a combination of post traumatic and small vessel etiology.   Original Report Authenticated By: Erskine Speed, M.D.    Medications:  I have reviewed the patient's current medications. Scheduled: . amLODipine  10 mg Oral Daily  . atorvastatin  40 mg Oral QPM  . cloNIDine  0.3 mg Oral TID  . heparin  5,000 Units Subcutaneous Q8H  . insulin aspart  0-15 Units Subcutaneous TID WC  . irbesartan  300 mg Oral QHS  . levETIRAcetam  500 mg Oral BID  . metoprolol tartrate  25 mg Oral BID  . multivitamin with minerals  1 tablet Oral Daily  . polyethylene glycol  17 g Oral Daily    Assessment/Plan: Patient improved today.  Presentation may very well have been related to a seizure but can not rule out the possibility of a TIA as well.  MRI reviewed and shows no evidence of an acute infarct.  Echocardiogram and carotid dopplers are unremarkable.  A1c elevated at 8.1.  LDL 150.    Recommendations: 1.  Increase Keppra to 750mg  BID 2.  Start ASA 81mg  daily 3.  Management of BS's and  lipids    LOS: 3 days   Thana Farr, MD [facility] [phone] 01/11/2013  11:13 AM

## 2013-01-11 NOTE — Discharge Summary (Signed)
Physician Discharge Summary  April [name] ZOX:[id number] DOB: 02/22/1922 DOA: 01/08/2013  PCP: Jeanann Lewandowsky, MD  Admit date: 01/08/2013 Discharge date: 01/11/2013  Time spent: 30 minutes  Recommendations for Outpatient Follow-up:  1. Follow up with PCP in 1-2 weeks 2. Would follow up with Neurology 2-3 weeks.  Discharge Diagnoses:  Active Problems:   CKD (chronic kidney disease) stage 3, GFR 30-59 ml/min   Hypertensive emergency   Acute encephalopathy   Hypokalemia   Malignant hypertension Likely seizures  Discharge Condition: Stable  Diet recommendation: Heart healthy  Filed Weights   01/09/13 0258 01/11/13 0400  Weight: 52.4 kg (115 lb 8.3 oz) 55.566 kg (122 lb 8 oz)    History of present illness:  April [name] is a 79 y.o. 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 Patient was brought to [facility]: [facility] with new onset of L facial weakness since yesterday as well as increasing confusion with decreased responsiveness. Patient's son stated that she was usually able to ambulate with walker at baseline but hasnt been able to do so and stopped eating as well yesterday.  In the ED she was noted to have an initial BP of 258/78, she was evaluated by neurology and findings were c/w L sided symptoms from a R brain syndrome (Stroke vs PRESS). CT head was negative for SDH or acute stroke. Hospitalist asked to admit.  Hospital Course:  The patient was admitted to the floor. Neurology was consulted. The patient underwent an MRI of the brain which was negative for CVA. Instead, the patient was thought to have seizures and was therefore continued on keppra with the dose increased from 500mg  bid to 750mg  bid.  The patient's blood pressure was found to be uncontrolled and difficult to manage. Doses of clonidine and metoprolol were not  increased secondary to a heart rate staying in the mid-50's. Instead, scheduled hydralazine was started with plans to titrate in the outpatient setting.  Consultations:  Neurology  Discharge Exam: Filed Vitals:   01/11/13 0400 01/11/13 0812 01/11/13 1011 01/11/13 1124  BP: 188/81 195/78 188/77 199/73  Pulse: 59 59 57 57  Temp: 97.6 F (36.4 C) 97.3 F (36.3 C)  97.3 F (36.3 C)  TempSrc: Oral Oral  Oral  Resp:      Height:      Weight: 55.566 kg (122 lb 8 oz)     SpO2: 100% 98%  99%    General: Awake, in nad Cardiovascular: regular, s1, s2 Respiratory: normal resp effort, no wheezing  Discharge Instructions       Future Appointments Provider Department Dept Phone   01/30/2013 10:15 AM [facility] Covering Provider [facility] [phone]       Medication List    STOP taking these medications       glucose 4 GM chewable tablet      TAKE these medications       amLODipine 10 MG tablet  Commonly known as:  NORVASC  Take 1 tablet (10 mg total) by mouth daily.     atorvastatin 40 MG tablet  Commonly known as:  LIPITOR  Take 1 tablet (40 mg total) by mouth every evening.     cloNIDine 0.1 MG tablet  Commonly known as:  CATAPRES  Take 3 tablets (0.3 mg total) by mouth 3 (three) times daily.  fish oil-omega-3 fatty acids 1000 MG capsule  Take 2 capsules (2 g total) by mouth daily.     hydrALAZINE 10 MG tablet  Commonly known as:  APRESOLINE  Take 1 tablet (10 mg total) by mouth every 8 (eight) hours.     irbesartan 300 MG tablet  Commonly known as:  AVAPRO  Take 1 tablet (300 mg total) by mouth at bedtime.     levETIRAcetam 750 MG tablet  Commonly known as:  KEPPRA  Take 1 tablet (750 mg total) by mouth 2 (two) times daily.     metoprolol tartrate 25 MG tablet  Commonly known as:  LOPRESSOR  Take 1 tablet (25 mg total) by mouth 2 (two) times daily.     multivitamin with minerals Tabs tablet  Take 1 tablet by mouth  daily.     polyethylene glycol powder powder  Commonly known as:  MIRALAX  Take 17 g by mouth daily.     sitaGLIPtin 50 MG tablet  Commonly known as:  JANUVIA  Take 1 tablet (50 mg total) by mouth daily.       No Known Allergies Follow-up Information   Follow up with JEGEDE, OLUGBEMIGA, MD In 1 week.   Contact information:   [address] [place] [address] [phone]       The results of significant diagnostics from this hospitalization (including imaging, microbiology, ancillary and laboratory) are listed below for reference.    Significant Diagnostic Studies: Dg Chest 2 View  01/09/2013   *RADIOLOGY REPORT*  Clinical Data: Stroke.  CHEST - 2 VIEW  Comparison: 08/05 and 12/06/2012  Findings: Heart size and vascularity are normal.  Vessels are accentuated at the bases particularly on the right because of a shallow inspiration.  The focal area of consolidation at the left lung base has almost completely cleared.  This probably represented atelectasis.  IMPRESSION: Slight residual atelectasis at the left lung base.   Original Report Authenticated By: Francene Boyers, M.D.   Dg Abd 1 View  01/09/2013   *RADIOLOGY REPORT*  Clinical Data: Abdominal pain.  ABDOMEN - 1 VIEW  Comparison: 08/07/2012  Findings: IVC filter in place.  Bowel gas pattern is normal. Benign bone island in the left ilium.  Degenerative disc disease in the lower lumbar spine as well as degenerative arthritis of the left sacroiliac joint.  No visible free air or free fluid.  IMPRESSION: Benign-appearing abdomen.   Original Report Authenticated By: Francene Boyers, M.D.   Ct Head (brain) Wo Contrast  01/08/2013   *RADIOLOGY REPORT*  Clinical Data: Altered mental status.  Recent subdural hematoma.  CT HEAD WITHOUT CONTRAST  Technique:  Contiguous axial images were obtained from the base of the skull through the vertex without contrast.  Comparison: 12/13/2012  Findings: There is no acute intracranial hemorrhage, mass  lesion, or infarction.  The air and fluid in the subdural space deep to the cranioplasty of the right has diminished with secondary decreased mass effect upon the underlying brain. infarct as well as a lacunar infarcts in the basal ganglia, all stable.  IMPRESSION: No acute intracranial abnormality.  Decreased air and fluid in the subdural space deep to the cranioplasty on the right.   Original Report Authenticated By: Francene Boyers, M.D.   Ct Head Wo Contrast  12/13/2012   *RADIOLOGY REPORT*  Clinical Data: Follow up cranioplasty  CT HEAD WITHOUT CONTRAST  Technique:  Contiguous axial images were obtained from the base of the skull through the vertex without contrast.  Comparison: 11/12/2012  Findings: Interval cranioplasty on the right.  The bone flap has been replaced and is in good position.  There is extensive dural calcification which has a convex contour with flattening of the cerebral cortex on the right.  There is fluid and gas in the extradural space on the right compatible with recent surgery.  No acute hemorrhage  Generalized atrophy.  Moderate chronic microvascular ischemic changes.  No acute infarct or mass.  IMPRESSION: Interval right frontal parietal cranioplasty.  No acute hemorrhage.  Atrophy and chronic microvascular ischemia.  No acute infarct.   Original Report Authenticated By: Janeece Riggers, M.D.   Mr Brain Wo Contrast  01/10/2013   *RADIOLOGY REPORT*  Clinical Data: 79 year old female with left facial weakness, confusion.  History of brain surgery for subdural hematoma.  MRI HEAD WITHOUT CONTRAST  Technique:  Multiplanar, multiecho pulse sequences of the brain and surrounding structures were obtained according to standard protocol without intravenous contrast.  Comparison: Head CTs 01/08/2013 and earlier.  Findings: Sequelae of right lateral craniectomy with flap replacement as demonstrated on prior studies.  Mild postoperative extra-axial collection and/or dural thickening underlying the  surgical site. No acute intracranial hemorrhage identified.  No midline shift, mass effect, or evidence of mass lesion.  No restricted diffusion to suggest acute infarction.  Major intracranial vascular flow voids are preserved.  Confluent cerebral white matter T2 and FLAIR hyperintensity plus mild cortical encephalomalacia at the anterior right frontal operculum.  Cerebral volume loss.  Occasional chronic micro hemorrhages (left cerebellum series 7 image 8).  Moderate T2 heterogeneity in the deep gray matter nuclei, favor mostly related to perivascular spaces. Brainstem and cerebellum within normal limits for age.  Deep sella turcica.  Negative cervicomedullary junction.  Degenerative changes in the visualized cervical spine.  Postoperative changes to the globes.  Minor paranasal sinus mucosal thickening.  Mild left mastoid effusion.  No acute scalp soft tissue changes. Visualized bone marrow signal is within normal limits.  IMPRESSION: 1. No acute intracranial abnormality. 2.  Satisfactory postoperative appearance status post right convexity craniectomy with bone flap replacement.  Mild underlying postoperative collection and/or dural thickening. 3.  White matter signal changes and mild right frontal operculum encephalomalacia, nonspecific and could be a combination of post traumatic and small vessel etiology.   Original Report Authenticated By: Erskine Speed, M.D.   Dg Chest Portable 1 View  01/08/2013   *RADIOLOGY REPORT*  Clinical Data: Altered mental status, hypertension  PORTABLE CHEST - 1 VIEW  Comparison: December 06, 2012  Findings: There is patchy consolidation of left lung base.  There is no pulmonary edema.  The mediastinal contours normal.  The heart size is probably mildly enlarged.  The soft tissues and osseous structures are stable.  IMPRESSION: Mild patchy consolidation of left lung base,suspicious for pneumonia.   Original Report Authenticated By: Sherian Rein, M.D.    Microbiology: No results  found for this or any previous visit (from the past 240 hour(s)).   Labs: Basic Metabolic Panel:  Recent Labs Lab 01/08/13 1936 01/09/13 0610 01/11/13 0655  NA 139 141 140  K 3.0* 3.1* 3.6  CL 106 106 107  CO2 25 25 24   GLUCOSE 78 137* 321*  BUN 34* 33* 34*  CREATININE 3.11* 3.05* 2.81*  CALCIUM 9.3 8.9 8.6   Liver Function Tests:  Recent Labs Lab 01/08/13 1936 01/11/13 0655  AST 16 19  ALT 8 12  ALKPHOS 84 107  BILITOT 0.2* 0.1*  PROT 6.1 5.3*  ALBUMIN  2.0* 1.9*   No results found for this basename: LIPASE, AMYLASE,  in the last 168 hours No results found for this basename: AMMONIA,  in the last 168 hours CBC:  Recent Labs Lab 01/08/13 1936 01/11/13 0655  WBC 4.9 4.4  NEUTROABS 2.8  --   HGB 8.2* 8.5*  HCT 24.2* 25.8*  MCV 79.1 80.4  PLT 285 307   Cardiac Enzymes:  Recent Labs Lab 01/08/13 1936  TROPONINI <0.30   BNP: BNP (last 3 results)  Recent Labs  07/02/12 2116  PROBNP 184.3   CBG:  Recent Labs Lab 01/10/13 1123 01/10/13 1658 01/10/13 2059 01/11/13 0810 01/11/13 1203  GLUCAP 171* 232* 212* 281* 280*   Signed:  Brandin Stetzer K  [facility] 01/11/2013, 1:33 PM

## 2013-01-11 NOTE — Evaluation (Signed)
Physical Therapy Evaluation Patient Details Name: April [name] MRN: [id number] DOB: 1921-07-07 Today's Date: 01/11/2013 Time: 1129-1200 PT Time Calculation (min): 31 min  PT Assessment / Plan / Recommendation History of Present Illness  79 y.o. 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  Patient was [facility] to [facility] with new onset of L facial weakness since yesterday as well as increasing confusion with decreased responsiveness.  She has been through our inpatient rehab program and was discharged home on 08/18/12 with her son and 24 hour care at that time.    Clinical Impression  I am familiar with this pt from her previous admission and rehab stay.  She seems to be pretty close to her cognitive and physical baseline.  She lives with a very supportive son, April [name].  PT will follow actuely to make sure she returns to her baseline level of gait, but no f/u PT needed at this time.      PT Assessment  Patient needs continued PT services    Follow Up Recommendations  No PT follow up;Supervision/Assistance - 24 hour    Does the patient have the potential to tolerate intense rehabilitation     NA  Barriers to Discharge   None None    Equipment Recommendations  None recommended by PT    Recommendations for Other Services   None  Frequency Min 3X/week    Precautions / Restrictions Precautions Precautions: Fall Precaution Comments: left sided inattention   Pertinent Vitals/Pain See vitals flow sheet.       Mobility  Bed Mobility Bed Mobility: Supine to Sit;Sitting - Scoot to Edge of Bed Supine to Sit: 4: Min guard;With rails;HOB elevated Sitting - Scoot to Edge of Bed: 4: Min guard;With rail Details for Bed Mobility Assistance: min guard to support trunk for balance during tranisition to sitting.  Pt relying  heavily on railing to get to EOB.  Transfers Transfers: Sit to Stand;Stand to Sit Sit to Stand: 4: Min assist;With upper extremity assist;With armrests;From bed;From toilet Stand to Sit: 4: Min assist;With upper extremity assist;With armrests;To chair/3-in-1;To toilet Details for Transfer Assistance: min assist to support trunk especially getting up from low toilet.  Verbal cues to find grab bar on left and for safety during tranisitions.   Ambulation/Gait Ambulation/Gait Assistance: 4: Min assist Ambulation Distance (Feet): 150 Feet Assistive device: Rolling walker Ambulation/Gait Assistance Details: min assist to support trunk for balance and assist in steering RW. Verbal cues for left sided obstacles.  Verbal and manual cues for upright posture during gait.  Gait Pattern: Step-through pattern;Shuffle;Trunk flexed Modified Rankin (Stroke Patients Only) Pre-Morbid Rankin Score: Moderately severe disability Modified Rankin: Moderately severe disability        PT Diagnosis: Difficulty walking;Abnormality of gait;Generalized weakness;Altered mental status  PT Problem List: Decreased strength;Decreased activity tolerance;Decreased balance;Decreased mobility;Decreased cognition;Decreased knowledge of use of DME;Decreased safety awareness PT Treatment Interventions: DME instruction;Gait training;Stair training;Functional mobility training;Therapeutic activities;Therapeutic exercise;Balance training;Neuromuscular re-education;Patient/family education;Cognitive remediation     PT Goals(Current goals can be found in the care plan section) Acute Rehab PT Goals Patient Stated Goal: to go home PT Goal Formulation: Patient unable to participate in goal setting Time For Goal Achievement: 01/25/13 Potential to Achieve Goals: Good  Visit Information  Last PT Received On: 01/11/13 Assistance Needed: +1 PT/OT Co-Evaluation/Treatment: Yes History of Present Illness: 79 y.o. female with  extensive PMH  significant for HTN, DM, hyperlipidemia, CKD stage 3, s/p craniectomy removal of chronic R SDH in Feb with recurrence of SDH and reoperation in March with recent cranioplasty, DVT filter in past.  Patient was brought to [facility] with new onset of L facial weakness since yesterday as well as increasing confusion with decreased responsiveness.  She has been through our inpatient rehab program and was discharged home on 08/18/12 with her son and 24 hour care at that time.         Prior Functioning  Home Living Family/patient expects to be discharged to:: Private residence Living Arrangements: Children Available Help at Discharge: Family;Available 24 hours/day Type of Home: House Home Access: Stairs to enter [employer] of Steps: 3-4 Entrance Stairs-Rails: Right Home Layout: One level Home Equipment: Walker - 2 wheels;Shower seat Additional Comments: pt has 24 hour care at home and lives with son, April [name]  Lives With: Son Prior Function Level of Independence: Needs assistance Gait / Transfers Assistance Needed: gait with RW, likely at least supervision PTA Communication / Swallowing Assistance Needed: none Communication Communication: No difficulties Dominant Hand: Right    Cognition  Cognition Arousal/Alertness: Awake/alert Behavior During Therapy: WFL for tasks assessed/performed Overall Cognitive Status: History of cognitive impairments - at baseline Area of Impairment: Orientation;Attention;Memory;Safety/judgement;Awareness;Problem solving Current Attention Level: Sustained Memory: Decreased short-term memory Safety/Judgement: Decreased awareness of safety;Decreased awareness of deficits Awareness: Intellectual Problem Solving: Slow processing;Requires verbal cues;Requires tactile cues General Comments: left sided inattention, needs cues for this side.    Extremity/Trunk Assessment Upper Extremity Assessment Upper Extremity Assessment: Defer to OT evaluation Lower Extremity  Assessment Lower Extremity Assessment: Generalized weakness Cervical / Trunk Assessment Cervical / Trunk Assessment: Other exceptions Cervical / Trunk Exceptions: forward head mildly kyphotic, flexed trunk during gait      End of Session PT - End of Session Activity Tolerance: Patient limited by fatigue Patient left: in chair;with call bell/phone within reach;with chair alarm set    Rossi Silvestro B. Khushbu Pippen, PT, DPT [phone]   01/11/2013, 12:37 PM

## 2013-01-11 NOTE — Progress Notes (Signed)
Patient evaluated for community based chronic disease management services with [facility] as a benefit of patient's [employer].  Admitted on 8.5.14 for left facial droop and change in mentation.  Patient was noted to be in hypertensive crisis.  Son admits that he has not been consistently giving the patient her blood pressure and seizure medicines as ordered.  The son was concerned that the Keppra was causing her mentation to become sluggish.  In addition he expressed concern with her mounting medical copayments for procedures.  They are able to afford medicines at this time.  Patient will be provided a HHRN by [facility] for medication management.  Patient will receive a post discharge transition of care call and will be evaluated for monthly home visits for assessments and disease process education.  Spoke with patient's son/HCPOA Geena Weinhold [phone]) at bedside to explain [facility] Care Management services.  Written consents obtained.  Left contact information and [facility] literature at bedside. Made Tomi Bamberger RN Inpatient Case Manager aware that [facility] Care Management following. Of note, [facility] Care Management services does not replace or interfere with any services that are arranged by inpatient case management or social work.  For additional questions or referrals please contact Anibal Henderson BSN RN [facility] Liaison at [phone].

## 2013-01-11 NOTE — Progress Notes (Signed)
Reached out to patient's son Nardos Putnam this am.  He is strongly considering [facility] services as he expressed that the coordination needs of his mother's care can be very challenging.  He feels that she was more stable and had a better quality of life when she was not on Keppra.  Informed him that the inpatient care management team would reach out to Dr Jule Ser to discuss the feasibility changing her Keppra regimen with the family.  This may be better accomplished on an outpatient basis as the patient is expected to be ready for medical discharge today.  He added the he would be in the hospital to visit between 12 Noon - 1pm today.  It was agreed that he would continue the plan of care discussion and engage [facility] services at that time.  Of note, [facility] Care Management services does not replace or interfere with any services that are arranged by inpatient case management or social work.  For additional questions or referrals please contact Anibal Henderson BSN RN [facility] Liaison at [phone].

## 2013-01-11 NOTE — Care Management Note (Addendum)
    Page 1 of 2   01/11/2013     2:34:24 PM   CARE MANAGEMENT NOTE 01/11/2013  Patient:  QUETZALLI, CLOS   Account Number:  [id number]  Date Initiated:  01/11/2013  Documentation initiated by:  GRAVES-BIGELOW,Aariya Ferrick  Subjective/Objective Assessment:   Pt admitted for new onset of L facial weakness as well as increasing confusion with decreased responsiveness. Pt was sleeping upon assessment. CM then called Son [name] who is the caregiver.     Action/Plan:   Patient's son stated that pt was usually able to ambulate with RW at baseline. She has cane and WC at home. CM did discuss the option of having HH servciesin the home and pt declined. He stated he has used them before and not helpful.   Anticipated DC Date:  01/11/2013   Anticipated DC Plan:  HOME W HOME HEALTH SERVICES      DC Planning Services  CM consult      [facility] Choice  HOME HEALTH   Choice offered to / List presented to:  C-4 Adult Children        HH arranged  HH-1 RN  HH-10 DISEASE MANAGEMENT      HH agency  [facility]   Status of service:  Completed, signed off Medicare Important Message given?   (If response is "NO", the following Medicare IM given date fields will be blank) Date Medicare IM given:   Date Additional Medicare IM given:    Discharge Disposition:  HOME W HOME HEALTH SERVICES  Per UR Regulation:  Reviewed for med. necessity/level of care/duration of stay  If discussed at Long Length of Stay Meetings, dates discussed:    Comments:   01-11-13 [address] Tomi Bamberger, RN,BSN [phone] CM did make referral for [facility] RN with [facility]. SOC to begin within 24-48 hours post d/c. No further needs from CM at this time.   01-11-13 [address], [place] [phone] CM did discuss the option of SNF and son refused. The son is a Brewing technologist and states he can take care of his mom. Son did have concerns with the medication Keppra and he was wondering if the medication  needed to be stopped. He stated that he thinks keppra is the source of her drowsiness. CM did relay this information to MD Rhona Leavens and he stated that he will make MD Amada Jupiter aware and have him address the medication issues. [facility] Liaison Anibal Henderson is aware of the pt and CM did ask son if it was ok for [facility] Liaison to contact him and he was receptive. Pt has a f/u appointment at the [facility] on 01-30-13 @ 10:15. CM did place a call to Nicki Guadalajara CSW at the [facility] and [facility] - received VM- to see if he would be able to speak to pt and family at f/u appointment in regards to resources that pt will benefit from in the home or via a facility. CM will continue to monitor for additional disposition needs.

## 2013-01-11 NOTE — Evaluation (Signed)
Occupational Therapy Evaluation Patient Details Name: April [name] MRN: [id number] DOB: 13-Jul-1921 Today's Date: 01/11/2013 Time: 1129-1200 OT Time Calculation (min): 31 min  OT Assessment / Plan / Recommendation History of present illness 79 y.o. 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  Patient was brought to [facility] with new onset of L facial weakness since yesterday as well as increasing confusion with decreased responsiveness.  She has been through our inpatient rehab program and was discharged home on 08/18/12 with her son and 24 hour care at that time.     Clinical Impression   Pt admitted with above. Will continue to follow pt acutely to address below problem list and to maximize independence and safety with ADLs before return home with son.     OT Assessment  Patient needs continued OT Services    Follow Up Recommendations  No OT follow up;Supervision/Assistance - 24 hour    Barriers to Discharge      Equipment Recommendations  3 in 1 bedside comode    Recommendations for Other Services    Frequency  Min 2X/week    Precautions / Restrictions Precautions Precautions: Fall Precaution Comments: left sided inattention   Pertinent Vitals/Pain See vitals    ADL  Grooming: Performed;Min guard;Wash/dry hands Where Assessed - Grooming: Unsupported standing Upper Body Bathing: Simulated;Supervision/safety Where Assessed - Upper Body Bathing: Unsupported sitting Lower Body Bathing: Simulated;Minimal assistance Where Assessed - Lower Body Bathing: Supported sit to stand Upper Body Dressing: Performed;Minimal assistance Where Assessed - Upper Body Dressing: Unsupported sitting Lower Body Dressing: Simulated;Minimal assistance Where Assessed - Lower Body Dressing: Supported sit to Pharmacist, hospital:  Performed;Minimal assistance Toilet Transfer Method:  (ambulating) Acupuncturist: Regular height toilet;Grab bars Toileting - Clothing Manipulation and Hygiene: Performed;Min guard Where Assessed - Engineer, mining and Hygiene: Sit to stand from 3-in-1 or toilet Equipment Used: Gait belt;Rolling walker Transfers/Ambulation Related to ADLs: Min assist with RW. Assist for steering RW and for balance. ADL Comments: Constant VCs for locating items on left side (left side inattention).    OT Diagnosis: Generalized weakness;Cognitive deficits;Disturbance of vision;Paresis  OT Problem List: Decreased strength;Decreased activity tolerance;Impaired balance (sitting and/or standing);Impaired vision/perception;Decreased cognition;Decreased knowledge of use of DME or AE;Impaired UE functional use OT Treatment Interventions: Self-care/ADL training;DME and/or AE instruction;Therapeutic activities;Patient/family education;Visual/perceptual remediation/compensation;Cognitive remediation/compensation;Balance training   OT Goals(Current goals can be found in the care plan section) Acute Rehab OT Goals Patient Stated Goal: to go home OT Goal Formulation: With patient Time For Goal Achievement: 01/25/13 Potential to Achieve Goals: Good  Visit Information  Last OT Received On: 01/11/13 Assistance Needed: +1 PT/OT Co-Evaluation/Treatment: Yes History of Present Illness: 79 y.o. 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  Patient was brought to [facility] with new onset of L facial weakness since yesterday as well as increasing confusion with decreased responsiveness.  She has been through our inpatient rehab program and was discharged home on 08/18/12 with her son and 24 hour care at that time.         Prior  Functioning     Home Living Family/patient expects to be discharged to:: Private residence Living Arrangements: Children Available Help at Discharge:  Family;Available 24 hours/day Type of Home: House Home Access: Stairs to enter [employer] of Steps: 3-4 Entrance Stairs-Rails: Right Home Layout: One level Home Equipment: Walker - 2 wheels;Shower seat Additional Comments: pt has 24 hour care at home and lives with son, [name]  Lives With: Son Prior Function Level of Independence: Needs assistance Gait / Transfers Assistance Needed: gait with RW, likely at least supervision PTA Communication / Swallowing Assistance Needed: none Communication Communication: No difficulties Dominant Hand: Right         Vision/Perception Vision - History Baseline Vision: Wears glasses only for reading Visual History:  (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  Unable to formally assess due to vision.  Pt requires constant cueing to locate items on left side.   Cognition  Cognition Arousal/Alertness: Awake/alert Behavior During Therapy: WFL for tasks assessed/performed Overall Cognitive Status: History of cognitive impairments - at baseline Area of Impairment: Orientation;Attention;Memory;Safety/judgement;Awareness;Problem solving Orientation Level: Disoriented to;Time Current Attention Level: Sustained Memory: Decreased short-term memory Safety/Judgement: Decreased awareness of safety;Decreased awareness of deficits Awareness: Intellectual Problem Solving: Slow processing;Requires verbal cues;Requires tactile cues General Comments: left sided inattention, needs cues for this side.    Extremity/Trunk Assessment Upper Extremity Assessment Upper Extremity  Assessment: Generalized weakness;LUE deficits/detail LUE Sensation: decreased light touch;decreased proprioception LUE Coordination: decreased fine motor;decreased gross motor Lower Extremity Assessment Lower Extremity Assessment: Generalized weakness Cervical / Trunk Assessment Cervical / Trunk Assessment: Other exceptions Cervical / Trunk Exceptions: forward head mildly kyphotic, flexed trunk during gait     Mobility Bed Mobility Bed Mobility: Supine to Sit;Sitting - Scoot to Edge of Bed Supine to Sit: 4: Min guard;With rails;HOB elevated Sitting - Scoot to Edge of Bed: 4: Min guard;With rail Details for Bed Mobility Assistance: min guard to support trunk for balance during tranisition to sitting.  Pt relying heavily on railing to get to EOB.  Transfers Transfers: Sit to Stand;Stand to Sit Sit to Stand: 4: Min assist;With upper extremity assist;From toilet;From bed Stand to Sit: 4: Min assist;To toilet;To chair/3-in-1;With armrests;With upper extremity assist Details for Transfer Assistance: min assist to support trunk especially getting up from low toilet.  Verbal cues to find grab bar on left and for safety during tranisitions.       Exercise     Balance     End of Session OT - End of Session Equipment Utilized During Treatment: Gait belt;Rolling walker Activity Tolerance: Patient tolerated treatment well Patient left: in chair;with call bell/phone within reach;with chair alarm set Nurse Communication: Mobility status  GO   01/11/2013 Cipriano Mile OTR/L Pager [phone] Office [phone]   Cipriano Mile 01/11/2013, 1:20 PM

## 2013-01-11 NOTE — Progress Notes (Signed)
Clinical Child psychotherapist (CSW) received a call from [employer] with [facility] regarding locating pt son. CSW has left a message for Jorja Loa informing him that CSW is not currently working on placement for pt. Pt has not been seen by PT/OT and CSW has not received a referral. CSW will sign on once appropriate recommendations made for pt.  Theresia Bough, MSW, Theresia Majors [phone]

## 2013-01-30 ENCOUNTER — Ambulatory Visit: Payer: Medicare Other

## 2013-02-11 ENCOUNTER — Emergency Department (HOSPITAL_COMMUNITY)
Admission: EM | Admit: 2013-02-11 | Discharge: 2013-02-12 | Disposition: A | Discharge status: Home / Self Care | Payer: Medicare Other | Attending: Emergency Medicine | Admitting: Emergency Medicine

## 2013-02-11 ENCOUNTER — Emergency Department (HOSPITAL_COMMUNITY): Payer: Medicare Other

## 2013-02-11 ENCOUNTER — Encounter (HOSPITAL_COMMUNITY): Payer: Self-pay | Admitting: Emergency Medicine

## 2013-02-11 DIAGNOSIS — N189 Chronic kidney disease, unspecified: Secondary | ICD-10-CM

## 2013-02-11 DIAGNOSIS — E876 Hypokalemia: Secondary | ICD-10-CM

## 2013-02-11 DIAGNOSIS — R413 Other amnesia: Secondary | ICD-10-CM | POA: Insufficient documentation

## 2013-02-11 DIAGNOSIS — E870 Hyperosmolality and hypernatremia: Secondary | ICD-10-CM | POA: Insufficient documentation

## 2013-02-11 DIAGNOSIS — Z8739 Personal history of other diseases of the musculoskeletal system and connective tissue: Secondary | ICD-10-CM | POA: Insufficient documentation

## 2013-02-11 DIAGNOSIS — E860 Dehydration: Secondary | ICD-10-CM

## 2013-02-11 DIAGNOSIS — E119 Type 2 diabetes mellitus without complications: Secondary | ICD-10-CM | POA: Insufficient documentation

## 2013-02-11 DIAGNOSIS — E785 Hyperlipidemia, unspecified: Secondary | ICD-10-CM | POA: Insufficient documentation

## 2013-02-11 DIAGNOSIS — N183 Chronic kidney disease, stage 3 unspecified: Secondary | ICD-10-CM | POA: Insufficient documentation

## 2013-02-11 DIAGNOSIS — Z8701 Personal history of pneumonia (recurrent): Secondary | ICD-10-CM | POA: Insufficient documentation

## 2013-02-11 DIAGNOSIS — I129 Hypertensive chronic kidney disease with stage 1 through stage 4 chronic kidney disease, or unspecified chronic kidney disease: Secondary | ICD-10-CM | POA: Insufficient documentation

## 2013-02-11 DIAGNOSIS — Z86718 Personal history of other venous thrombosis and embolism: Secondary | ICD-10-CM | POA: Insufficient documentation

## 2013-02-11 DIAGNOSIS — Z79899 Other long term (current) drug therapy: Secondary | ICD-10-CM | POA: Insufficient documentation

## 2013-02-11 LAB — CBC WITH DIFFERENTIAL/PLATELET
Eosinophils Relative: 0 % (ref 0–5)
MCH: 27.4 pg (ref 26.0–34.0)
MCHC: 33.9 g/dL (ref 30.0–36.0)
Monocytes Absolute: 0.4 10*3/uL (ref 0.1–1.0)
Neutro Abs: 5 10*3/uL (ref 1.7–7.7)
Neutrophils Relative %: 71 % (ref 43–77)
RDW: 16 % — ABNORMAL HIGH (ref 11.5–15.5)

## 2013-02-11 LAB — COMPREHENSIVE METABOLIC PANEL
ALT: 14 U/L (ref 0–35)
Alkaline Phosphatase: 88 U/L (ref 39–117)
BUN: 31 mg/dL — ABNORMAL HIGH (ref 6–23)
CO2: 27 mEq/L (ref 19–32)
Calcium: 10 mg/dL (ref 8.4–10.5)
GFR calc Af Amer: 15 mL/min — ABNORMAL LOW (ref >90)
GFR calc non Af Amer: 13 mL/min — ABNORMAL LOW (ref >90)
Glucose, Bld: 123 mg/dL — ABNORMAL HIGH (ref 70–99)
Sodium: 146 mEq/L — ABNORMAL HIGH (ref 135–145)

## 2013-02-11 MED ORDER — LABETALOL HCL 5 MG/ML IV SOLN
20.0000 mg | Freq: Once | INTRAVENOUS | Status: AC
  Administered 2013-02-12: 20 mg via INTRAVENOUS
  Filled 2013-02-11: qty 4

## 2013-02-11 NOTE — ED Notes (Signed)
Assisted patient to bathroom for urine collection.  Patient unable to void at this time.  Will notify staff when she feels like she can void.  Bobby RN notified.

## 2013-02-11 NOTE — ED Notes (Signed)
Son reported that pt. has not been eating for several days with generalized weakness , also reported low abdominal pain .

## 2013-02-11 NOTE — ED Provider Notes (Signed)
CSN: [id number]     Arrival date & time 02/11/13  1949 History   First MD Initiated Contact with Patient 02/11/13 2304     No chief complaint on file.  (Consider location/radiation/quality/duration/timing/severity/associated sxs/prior Treatment) HPI Patient is an elderly woman with multiple chronic medical problems including history of spontaneous SDH, DM, HTN, CKD, DVT. She is BIB EMS from home where she lives with her son. Son reports that the patient has had intermittent complaints of diffuse abdominal pain for the past 3 days. Although, no recent complaints over past few hours. He is particularly concerned because the patient's po intake has been substantially diminished.   Patient has not been noted to have vomiting or diarrhea. She had three soft but nonwatery and non bloody BMs today. Son says that the patient has no history of abdominal surgeries. The patient denies abdominal pain at this time. Her only complaint is feeling cold. She recalls having had abdominal pain over the past few days but is unable to rate or describe her pain to me.   Son has not appreciated a fever.   Past Medical History  Diagnosis Date  . Diabetes mellitus   . Hypertension   . Hyperlipidemia   . Pneumonia   . Subdural hematoma   . Arthritis   . DVT (deep venous thrombosis)   . Incontinence   . Renal disorder     chronic kidney dz stage III   Past Surgical History  Procedure Laterality Date  . Insertion of vena cava filter    . Craniotomy Right 07/23/2012    Procedure: CRANIOTOMY HEMATOMA EVACUATION SUBDURAL;  Surgeon: [name], MD;  Location: [facility];  Service: Neurosurgery;  Laterality: Right;  . Craniotomy N/A 08/06/2012    Procedure: CRANIOTOMY HEMATOMA EVACUATION SUBDURAL;  Surgeon: [name], MD;  Location: [facility];  Service: Neurosurgery;  Laterality: N/A;  Craniectomy for evacuation of subdural hematoma, implantation of bone flap in abdominal wall   . Brain surgery     . Craniotomy N/A 12/12/2012    Procedure: CRANIOTOMY BONE FLAP/PROSTHETIC PLATE retrieval of bone from abdomen;  Surgeon: [name], MD;  Location: [facility];  Service: Neurosurgery;  Laterality: N/A;  cranioplasty with retrieval of bone flap from abdominal wall pocket. Site is head and abdomen.   Family History  Problem Relation Age of Onset  . Diabetes Mellitus II Son    History  Substance Use Topics  . Smoking status: Never Smoker   . Smokeless tobacco: Never Used  . Alcohol Use: No   OB History   Grav Para Term Preterm Abortions TAB SAB Ect Mult Living                 Review of Systems Review of systems is difficult to obtain from the patient he appears to either have some dementia. Thus review of systems is limited. Level V caveat  applies  Gen: no weight loss, fevers, chills, night sweats Eyes: no discharge or drainage, no occular pain or visual changes Nose: no epistaxis or rhinorrhea Mouth: no dental pain, no sore throat  Neck: no neck pain Lungs: no SOB, cough, wheezing CV: no chest pain, palpitations, dependent edema or orthopnea Abd: as per hpi, otherwise negative GU: no dysuria or gross hematuria MSK: no myalgias or arthralgias Neuro: no headache, no focal neurologic deficits Skin: no rash Psyche: negative.  Allergies  Review of patient's allergies indicates no known allergies.  Home Medications   Current Outpatient Rx  Name  Route  Sig  Dispense  Refill  . amLODipine (NORVASC) 10 MG tablet   Oral   Take 1 tablet (10 mg total) by mouth daily.   30 tablet   1   . atorvastatin (LIPITOR) 40 MG tablet   Oral   Take 1 tablet (40 mg total) by mouth every evening.   30 tablet   1   . cloNIDine (CATAPRES) 0.1 MG tablet   Oral   Take 3 tablets (0.3 mg total) by mouth 3 (three) times daily.   90 tablet   3   . fish oil-omega-3 fatty acids 1000 MG capsule   Oral   Take 2 capsules (2 g total) by mouth daily.   30 capsule   3   . hydrALAZINE  (APRESOLINE) 10 MG tablet   Oral   Take 1 tablet (10 mg total) by mouth every 8 (eight) hours.   90 tablet   0   . irbesartan (AVAPRO) 300 MG tablet   Oral   Take 1 tablet (300 mg total) by mouth at bedtime.   30 tablet   3   . metoprolol tartrate (LOPRESSOR) 25 MG tablet   Oral   Take 1 tablet (25 mg total) by mouth 2 (two) times daily.   60 tablet   2   . Multiple Vitamin (MULTIVITAMIN WITH MINERALS) TABS   Oral   Take 1 tablet by mouth daily.         . polyethylene glycol powder (MIRALAX) powder   Oral   Take 17 g by mouth daily.   255 g   0   . pravastatin (PRAVACHOL) 20 MG tablet   Oral   Take 10 mg by mouth daily.          BP 207/89  Pulse 91  Temp(Src) 98 F (36.7 C) (Oral)  Resp 18  SpO2 98% Physical Exam Gen: well developed and well nourished appearing frail appearing woman laying supine in gurney, does not appear to be uncomfortable Head: NCAT Eyes: PERL, EOMI Nose: no epistaixis or rhinorrhea Mouth/throat: mucosa is moist and pink, edentulous Neck: supple, no stridor Lungs: CTA B, no wheezing, rhonchi or rales CV: Regular rate and rhythm, no murmur, extremities well perfused, bounding pulses Abd: soft, notender, nondistended Back: Kyphosis, no ttp, no cva ttp Skin: Warm and dry Neuro: CN ii-xii grossly intact, no focal deficits Psyche; flat affect,  calm and cooperative.   ED Course  Procedures (including critical care time)  Results for orders placed during the hospital encounter of 02/11/13 (from the past 24 hour(s))  CBC WITH DIFFERENTIAL     Status: Abnormal   Collection Time    02/11/13  8:08 PM      Result Value Range   WBC 7.1  4.0 - 10.5 K/uL   RBC 3.69 (*) 3.87 - 5.11 MIL/uL   Hemoglobin 10.1 (*) 12.0 - 15.0 g/dL   HCT 11.9 (*) 14.7 - 82.9 %   MCV 80.8  78.0 - 100.0 fL   MCH 27.4  26.0 - 34.0 pg   MCHC 33.9  30.0 - 36.0 g/dL   RDW 56.2 (*) 13.0 - 86.5 %   Platelets 333  150 - 400 K/uL   Neutrophils Relative % 71  43 - 77 %    Neutro Abs 5.0  1.7 - 7.7 K/uL   Lymphocytes Relative 23  12 - 46 %   Lymphs Abs 1.6  0.7 - 4.0 K/uL   Monocytes Relative 5  3 - 12 %   Monocytes Absolute 0.4  0.1 - 1.0 K/uL   Eosinophils Relative 0  0 - 5 %   Eosinophils Absolute 0.0  0.0 - 0.7 K/uL   Basophils Relative 0  0 - 1 %   Basophils Absolute 0.0  0.0 - 0.1 K/uL  COMPREHENSIVE METABOLIC PANEL     Status: Abnormal   Collection Time    02/11/13  8:08 PM      Result Value Range   Sodium 146 (*) 135 - 145 mEq/L   Potassium 3.0 (*) 3.5 - 5.1 mEq/L   Chloride 108  96 - 112 mEq/L   CO2 27  19 - 32 mEq/L   Glucose, Bld 123 (*) 70 - 99 mg/dL   BUN 31 (*) 6 - 23 mg/dL   Creatinine, Ser 4.13 (*) 0.50 - 1.10 mg/dL   Calcium 24.4  8.4 - 01.0 mg/dL   Total Protein 6.5  6.0 - 8.3 g/dL   Albumin 2.6 (*) 3.5 - 5.2 g/dL   AST 25  0 - 37 U/L   ALT 14  0 - 35 U/L   Alkaline Phosphatase 88  39 - 117 U/L   Total Bilirubin 0.2 (*) 0.3 - 1.2 mg/dL   GFR calc non Af Amer 13 (*) >90 mL/min   GFR calc Af Amer 15 (*) >90 mL/min  PROTIME-INR     Status: None   Collection Time    02/12/13  1:43 AM      Result Value Range   Prothrombin Time 12.3  11.6 - 15.2 seconds   INR 0.93  0.00 - 1.49  APTT     Status: None   Collection Time    02/12/13  1:43 AM      Result Value Range   aPTT 30  24 - 37 seconds  LACTIC ACID, PLASMA     Status: None   Collection Time    02/12/13  1:43 AM      Result Value Range   Lactic Acid, Venous 0.9  0.5 - 2.2 mmol/L  POCT I-STAT TROPONIN I     Status: None   Collection Time    02/12/13  1:53 AM      Result Value Range   Troponin i, poc 0.05  0.00 - 0.08 ng/mL   Comment 3           URINALYSIS, ROUTINE W REFLEX MICROSCOPIC     Status: Abnormal   Collection Time    02/12/13  3:45 AM      Result Value Range   Color, Urine YELLOW  YELLOW   APPearance CLEAR  CLEAR   Specific Gravity, Urine 1.024  1.005 - 1.030   pH 7.0  5.0 - 8.0   Glucose, UA 100 (*) NEGATIVE mg/dL   Hgb urine dipstick TRACE (*)  NEGATIVE   Bilirubin Urine NEGATIVE  NEGATIVE   Ketones, ur 15 (*) NEGATIVE mg/dL   Protein, ur >272 (*) NEGATIVE mg/dL   Urobilinogen, UA 0.2  0.0 - 1.0 mg/dL   Nitrite NEGATIVE  NEGATIVE   Leukocytes, UA NEGATIVE  NEGATIVE  URINE MICROSCOPIC-ADD ON     Status: Abnormal   Collection Time    02/12/13  3:45 AM      Result Value Range   Squamous Epithelial / LPF FEW (*) RARE   WBC, UA 0-2  <3 WBC/hpf   RBC / HPF 0-2  <3 RBC/hpf   Bacteria, UA RARE  RARE   Casts  HYALINE CASTS (*) NEGATIVE   CT Head Wo Contrast (Final result)  Result time: 02/12/13 01:14:40    Final result by Rad Results In Interface (02/12/13 01:14:40)    Narrative:   *RADIOLOGY REPORT*  Clinical Data: Hypertension. Generalized weakness.  CT HEAD WITHOUT CONTRAST  Technique: Contiguous axial images were obtained from the base of the skull through the vertex without contrast.  Comparison: 01/08/2013  Findings: The patient is status post right frontoparietal cranioplasty. Decrease in thickness of fluid in their in the subdural space deep to the cranioplasty site.  There is prominence of the sulci and ventricles consistent with brain atrophy.  There is diffuse patchy low density throughout the subcortical and periventricular white matter consistent with chronic small vessel ischemic change.  There is no evidence for acute brain infarct, hemorrhage or mass.  There is moderate mucosal thickening involving the sphenoid sinus. The mastoid air cells are clear.  IMPRESSION:  1. No acute intracranial abnormalities. 2. Continued decrease in fluid and gas below the cranioplasty site within the subdural space. 3. Small vessel ischemic change and brain atrophy.   ABDOMINAL AORTA SCREENING ULTRASOUND  Technique: Ultrasound examination of the abdominal aorta was  performed as a screening evaluation for abdominal aortic aneurysm.  Comparison: None.  Abdominal Aorta: No aneurysm identified.Calcified  atherosclerotic  changes noted.  Maximum AP diameter: 2.2 cm.  Maximum TRV diameter: 1.8 cm.  Right common iliac: Maximum diameter: 9.6 mm  Left common iliac: Maximum diameter 9.9 mm.  IMPRESSION:  No abdominal aortic aneurysm identified.    EKG: NSR, normal axis, prolonged QT, QRS wnl, no acute ischemic changes  MDM  Patient with diminished po intake noted to be mildly clinically dehydrated, positive ketones in urine, hypernatremic and hypokalemic with essentially stable CKD. Stable anemia which is likely secondary to CKD. A normal lactic acid level helps [place] to rule out mesenteric ischemia. The patient has a benign abdominal exam and no recurrence of abdominal pain during hours of observation. The patient is willing to drink some fluids but, refuses food. We have treated with 1L of NS. Will not replete K in light of significantly impaired GFR - 15L    Brandt Loosen, MD 02/12/13 [phone]

## 2013-02-12 ENCOUNTER — Emergency Department (HOSPITAL_COMMUNITY): Payer: Medicare Other

## 2013-02-12 ENCOUNTER — Encounter (HOSPITAL_COMMUNITY): Payer: Self-pay | Admitting: Radiology

## 2013-02-12 LAB — PROTIME-INR: INR: 0.93 (ref 0.00–1.49)

## 2013-02-12 LAB — URINALYSIS, ROUTINE W REFLEX MICROSCOPIC
Bilirubin Urine: NEGATIVE
Ketones, ur: 15 mg/dL — AB
Nitrite: NEGATIVE
Specific Gravity, Urine: 1.024 (ref 1.005–1.030)
Urobilinogen, UA: 0.2 mg/dL (ref 0.0–1.0)

## 2013-02-12 LAB — URINE MICROSCOPIC-ADD ON

## 2013-02-12 LAB — APTT: aPTT: 30 seconds (ref 24–37)

## 2013-02-12 LAB — POCT I-STAT TROPONIN I

## 2013-02-12 LAB — LACTIC ACID, PLASMA: Lactic Acid, Venous: 0.9 mmol/L (ref 0.5–2.2)

## 2013-02-12 MED ORDER — METOPROLOL TARTRATE 50 MG PO TABS: 25.0000 mg | ORAL_TABLET | Freq: Two times a day (BID) | ORAL | Status: DC

## 2013-02-12 MED ORDER — CLONIDINE HCL 0.2 MG PO TABS: 0.1000 mg | ORAL_TABLET | Freq: Three times a day (TID) | ORAL | Status: DC

## 2013-02-12 NOTE — ED Notes (Signed)
Pt's son called to pick the pt up.

## 2013-02-12 NOTE — Discharge Instructions (Signed)
Dehydration, Adult Dehydration is when you lose more fluids from the body than you take in. Vital organs like the kidneys, brain, and heart cannot function without a proper amount of fluids and salt. Any loss of fluids from the body can cause dehydration.  CAUSES   Vomiting.  Diarrhea.  Excessive sweating.  Excessive urine output.  Fever. SYMPTOMS  Mild dehydration  Thirst.  Dry lips.  Slightly dry mouth. Moderate dehydration  Very dry mouth.  Sunken eyes.  Skin does not bounce back quickly when lightly pinched and released.  Dark urine and decreased urine production.  Decreased tear production.  Headache. Severe dehydration  Very dry mouth.  Extreme thirst.  Rapid, weak pulse (more than 100 beats per minute at rest).  Cold hands and feet.  Not able to sweat in spite of heat and temperature.  Rapid breathing.  Blue lips.  Confusion and lethargy.  Difficulty being awakened.  Minimal urine production.  No tears. DIAGNOSIS  Your caregiver will diagnose dehydration based on your symptoms and your exam. Blood and urine tests will help confirm the diagnosis. The diagnostic evaluation should also identify the cause of dehydration. TREATMENT  Treatment of mild or moderate dehydration can often be done at home by increasing the amount of fluids that you drink. It is best to drink small amounts of fluid more often. Drinking too much at one time can make vomiting worse. Refer to the home care instructions below. Severe dehydration needs to be treated at the hospital where you will probably be given intravenous (IV) fluids that contain water and electrolytes. HOME CARE INSTRUCTIONS   Ask your caregiver about specific rehydration instructions.  Drink enough fluids to keep your urine clear or pale yellow.  Drink small amounts frequently if you have nausea and vomiting.  Eat as you normally do.  Avoid:  Foods or drinks high in sugar.  Carbonated  drinks.  Juice.  Extremely hot or cold fluids.  Drinks with caffeine.  Fatty, greasy foods.  Alcohol.  Tobacco.  Overeating.  Gelatin desserts.  Wash your hands well to avoid spreading bacteria and viruses.  Only take over-the-counter or prescription medicines for pain, discomfort, or fever as directed by your caregiver.  Ask your caregiver if you should continue all prescribed and over-the-counter medicines.  Keep all follow-up appointments with your caregiver. SEEK MEDICAL CARE IF:  You have abdominal pain and it increases or stays in one area (localizes).  You have a rash, stiff neck, or severe headache.  You are irritable, sleepy, or difficult to awaken.  You are weak, dizzy, or extremely thirsty. SEEK IMMEDIATE MEDICAL CARE IF:   You are unable to keep fluids down or you get worse despite treatment.  You have frequent episodes of vomiting or diarrhea.  You have blood or green matter (bile) in your vomit.  You have blood in your stool or your stool looks black and tarry.  You have not urinated in 6 to 8 hours, or you have only urinated a small amount of very dark urine.  You have a fever.  You faint. MAKE SURE YOU:   Understand these instructions.  Will watch your condition.  Will get help right away if you are not doing well or get worse. Document Released: 05/23/2005 Document Revised: 08/15/2011 Document Reviewed: 01/10/2011 Exit[employer] Patient Information 2014 [employer].  

## 2013-02-12 NOTE — ED Notes (Signed)
Pt received prescriptions.

## 2013-02-12 NOTE — ED Notes (Signed)
Pt drank half of her sprite, MD informed. Pt did not want to eat her [place] sandwich or applesauce.

## 2013-02-12 NOTE — ED Notes (Addendum)
MD informed of pt's request, MD will write prescriptions shortly. Pt & pt's son informed.

## 2013-02-12 NOTE — ED Notes (Signed)
Pt ate apple sauce 

## 2013-02-12 NOTE — ED Notes (Signed)
Pt given [place] sandwich, applesauce, and sprite per MD request.

## 2013-02-12 NOTE — ED Notes (Signed)
Pt attempted to void on bedside commode, unable to at this time.

## 2013-02-12 NOTE — ED Notes (Signed)
I-stat troponin orders modified to meet criteria of 0, 3, & 6 hour draws. First I-stat drawn at 0150, second draw due at 104, third 105.

## 2013-02-12 NOTE — ED Notes (Signed)
MD at bedside. 

## 2013-02-12 NOTE — ED Notes (Addendum)
Pt's son requesting pt's BP medications to be refilled: metoprolol tartrate and clonidine.

## 2013-02-27 ENCOUNTER — Ambulatory Visit: Payer: Medicare Other | Admitting: *Deleted

## 2013-03-25 ENCOUNTER — Telehealth: Payer: Self-pay | Admitting: Internal Medicine

## 2013-03-25 MED ORDER — ATORVASTATIN CALCIUM 40 MG PO TABS: 40.0000 mg | ORAL_TABLET | Freq: Every evening | ORAL | Status: DC

## 2013-03-25 MED ORDER — CLONIDINE HCL 0.2 MG PO TABS: 0.1000 mg | ORAL_TABLET | Freq: Three times a day (TID) | ORAL | Status: DC

## 2013-03-25 MED ORDER — AMLODIPINE BESYLATE 10 MG PO TABS: 10.0000 mg | ORAL_TABLET | Freq: Every day | ORAL | Status: DC

## 2013-03-25 MED ORDER — HYDRALAZINE HCL 10 MG PO TABS: 10.0000 mg | ORAL_TABLET | Freq: Three times a day (TID) | ORAL | Status: DC

## 2013-03-25 MED ORDER — METOPROLOL TARTRATE 50 MG PO TABS: 25.0000 mg | ORAL_TABLET | Freq: Two times a day (BID) | ORAL | Status: DC

## 2013-03-25 NOTE — Telephone Encounter (Signed)
Scripts reordered and E-scribed to [facility] pharmacy listed. Pt only given 30 day supply until next visit 04/12/13. Informed we will not refill medication if not seen in office after 3 mnths.

## 2013-03-25 NOTE — Telephone Encounter (Signed)
Pt has run out of medications ( Hydralazine 10mg , Amlodipine 10mg , Clonidine, 0.2mg , Atorvastatin 40mg , Metoprotol 50mg .). Patient next schedule appointment is on 04/12/13 at 9:45am, patient cannot be without medication. Could she get a refill sent to [facility] on [address]. Please call patient today.

## 2013-04-12 ENCOUNTER — Ambulatory Visit: Payer: Medicare Other | Attending: Internal Medicine | Admitting: Internal Medicine

## 2013-04-12 ENCOUNTER — Encounter: Payer: Self-pay | Admitting: Internal Medicine

## 2013-04-12 VITALS — BP 197/83 | HR 93 | Temp 98.7°F | Resp 14

## 2013-04-12 DIAGNOSIS — R600 Localized edema: Secondary | ICD-10-CM

## 2013-04-12 DIAGNOSIS — I82409 Acute embolism and thrombosis of unspecified deep veins of unspecified lower extremity: Secondary | ICD-10-CM

## 2013-04-12 DIAGNOSIS — E785 Hyperlipidemia, unspecified: Secondary | ICD-10-CM | POA: Insufficient documentation

## 2013-04-12 DIAGNOSIS — R609 Edema, unspecified: Secondary | ICD-10-CM

## 2013-04-12 DIAGNOSIS — I10 Essential (primary) hypertension: Secondary | ICD-10-CM | POA: Insufficient documentation

## 2013-04-12 MED ORDER — CLONIDINE HCL 0.1 MG PO TABS: 0.1000 mg | ORAL_TABLET | Freq: Three times a day (TID) | ORAL | Status: DC

## 2013-04-12 MED ORDER — ADULT MULTIVITAMIN W/MINERALS CH: 1.0000 | ORAL_TABLET | Freq: Every day | ORAL | Status: AC

## 2013-04-12 MED ORDER — METOPROLOL TARTRATE 25 MG PO TABS: 25.0000 mg | ORAL_TABLET | Freq: Two times a day (BID) | ORAL | Status: DC

## 2013-04-12 MED ORDER — CLONIDINE HCL 0.2 MG PO TABS: 0.1000 mg | ORAL_TABLET | Freq: Three times a day (TID) | ORAL | Status: DC

## 2013-04-12 MED ORDER — HYDRALAZINE HCL 10 MG PO TABS: 10.0000 mg | ORAL_TABLET | Freq: Three times a day (TID) | ORAL | Status: DC

## 2013-04-12 MED ORDER — ATORVASTATIN CALCIUM 40 MG PO TABS: 40.0000 mg | ORAL_TABLET | Freq: Every evening | ORAL | Status: DC

## 2013-04-12 MED ORDER — OMEGA-3 FATTY ACIDS 1000 MG PO CAPS: 2.0000 g | ORAL_CAPSULE | Freq: Every day | ORAL | Status: AC

## 2013-04-12 MED ORDER — AMLODIPINE BESYLATE 10 MG PO TABS: 10.0000 mg | ORAL_TABLET | Freq: Every day | ORAL | Status: DC

## 2013-04-12 MED ORDER — POLYETHYLENE GLYCOL 3350 17 GM/SCOOP PO POWD: 17.0000 g | Freq: Every day | ORAL | Status: DC

## 2013-04-12 NOTE — Progress Notes (Signed)
Pt is here for a f/u visit. Pt is requesting a medication refill.

## 2013-04-12 NOTE — Progress Notes (Signed)
Patient ID: April [name], female   DOB: [birth year], 79 y.o.   MRN: [id number]  CC: Follow up  HPI: 79 year old female with past medical history significant for hypertension and dyslipidemia who presented to clinic for followup. Patient has no current complaints. She did not take morning medications today but plans to once she gets home. No chest pain or shortness of breath. No abdominal pain, nausea or vomiting.  No Known Allergies Past Medical History  Diagnosis Date  . Diabetes mellitus   . Hypertension   . Hyperlipidemia   . Pneumonia   . Subdural hematoma   . Arthritis   . DVT (deep venous thrombosis)   . Incontinence   . Renal disorder     chronic kidney dz stage III   Current Outpatient Prescriptions on File Prior to Visit  Medication Sig Dispense Refill  . pravastatin (PRAVACHOL) 20 MG tablet Take 10 mg by mouth daily.       No current facility-administered medications on file prior to visit.   Family History  Problem Relation Age of Onset  . Diabetes Mellitus II Son    History   Social History  . Marital Status: Divorced    Spouse Name: N/A    Number of Children: N/A  . Years of Education: N/A   Occupational History  . Not on file.   Social History Main Topics  . Smoking status: Never Smoker   . Smokeless tobacco: Never Used  . Alcohol Use: No  . Drug Use: No  . Sexual Activity: Not on file   Other Topics Concern  . Not on file   Social History Narrative  . No narrative on file    Review of Systems  Constitutional: Negative for fever, chills, diaphoresis, activity change, appetite change and fatigue.  HENT: Negative for ear pain, nosebleeds, congestion, facial swelling, rhinorrhea, neck pain, neck stiffness and ear discharge.   Eyes: Negative for pain, discharge, redness, itching and visual disturbance.  Respiratory: Negative for cough, choking, chest tightness, shortness of breath, wheezing and stridor.   Cardiovascular: Negative for chest pain,  palpitations and leg swelling.  Gastrointestinal: Negative for abdominal distention.  Genitourinary: Negative for dysuria, urgency, frequency, hematuria, flank pain, decreased urine volume, difficulty urinating and dyspareunia.  Musculoskeletal: Negative for back pain, joint swelling, arthralgias and gait problem.  Neurological: Negative for dizziness, tremors, seizures, syncope, facial asymmetry, speech difficulty, weakness, light-headedness, numbness and headaches.  Hematological: Negative for adenopathy. Does not bruise/bleed easily.  Psychiatric/Behavioral: Negative for hallucinations, behavioral problems, confusion, dysphoric mood, decreased concentration and agitation.    Objective:   Filed Vitals:   04/12/13 1027  BP: 197/83  Pulse: 93  Temp: 98.7 F (37.1 C)  Resp: 14    Physical Exam  Constitutional: Appears well-developed and well-nourished. No distress.  HENT: Normocephalic. External right and left ear normal. Oropharynx is clear and moist.  Eyes: Conjunctivae and EOM are normal. PERRLA, no scleral icterus.  Neck: Normal ROM. Neck supple. No JVD. No tracheal deviation. No thyromegaly.  CVS: RRR, S1/S2 +, no murmurs, no gallops, no carotid bruit.  Pulmonary: Effort and breath sounds normal, no stridor, rhonchi, wheezes, rales.  Abdominal: Soft. BS +,  no distension, tenderness, rebound or guarding.  Musculoskeletal: Normal range of motion. No edema and no tenderness.  Lymphadenopathy: No lymphadenopathy noted, cervical, inguinal. Neuro: Alert. Normal reflexes, muscle tone coordination. No cranial nerve deficit. Skin: Skin is warm and dry. No rash noted. Not diaphoretic. No erythema. No pallor.  Psychiatric: Normal mood and  affect. Behavior, judgment, thought content normal.   Lab Results  Component Value Date   WBC 7.1 02/11/2013   HGB 10.1* 02/11/2013   HCT 29.8* 02/11/2013   MCV 80.8 02/11/2013   PLT 333 02/11/2013   Lab Results  Component Value Date   CREATININE 3.04*  02/11/2013   BUN 31* 02/11/2013   NA 146* 02/11/2013   K 3.0* 02/11/2013   CL 108 02/11/2013   CO2 27 02/11/2013    Lab Results  Component Value Date   HGBA1C 8.7* 01/09/2013   Lipid Panel     Component Value Date/Time   CHOL 233* 01/09/2013 0610   TRIG 190* 01/09/2013 0610   HDL 45 01/09/2013 0610   CHOLHDL 5.2 01/09/2013 0610   VLDL 38 01/09/2013 0610   LDLCALC 150* 01/09/2013 0610       Assessment and plan:   Patient Active Problem List   Diagnosis Date Noted  . HTN (hypertension) 07/03/2012    Priority: Medium - Please note that the patient did not take morning medications  - We have discussed target BP range - I have advised pt to check BP regularly and to call us back if the numbers are higher than 140/90 - discussed the importance of compliance with medical therapy and diet  - Patient can continue taking metoprolol, Norvasc, hydralazine and clonidine was increased from 0.1 mg to 0.2 mg 3 times a day   . Hyperlipidemia 06/20/2012    Priority: Medium - Continue statin therapy

## 2013-05-12 IMAGING — CT CT HEAD W/O CM
1 series · 16 of 30 positions shown, 20 images · non-contrast
Comparison: CT 06/20/2012

CLINICAL DATA: Altered mental status.  Left-sided weakness.

CT HEAD WITHOUT CONTRAST
TECHNIQUE: Contiguous axial images were obtained from the base of
the skull through the vertex without contrast.

[Series 2: head routine 4.8 h37s · axial · 0.43mm/px · z∈[-145,+10]mm · 16 of 36 slices shown, 20 images]
[im 2/36  brain]
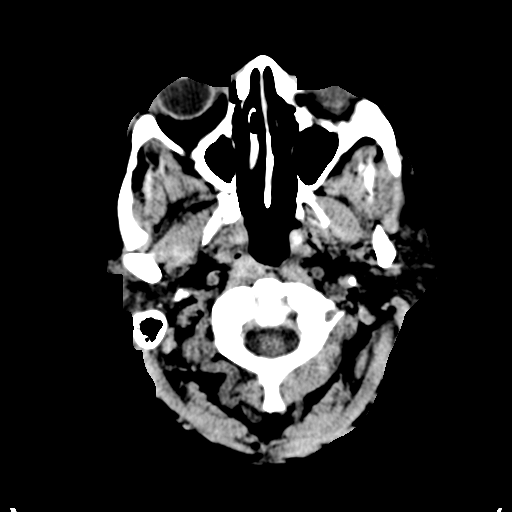
[im 2/36  bone]
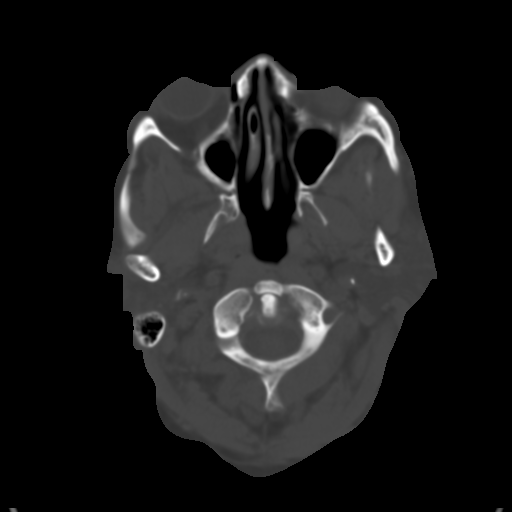
[im 4/36  brain]
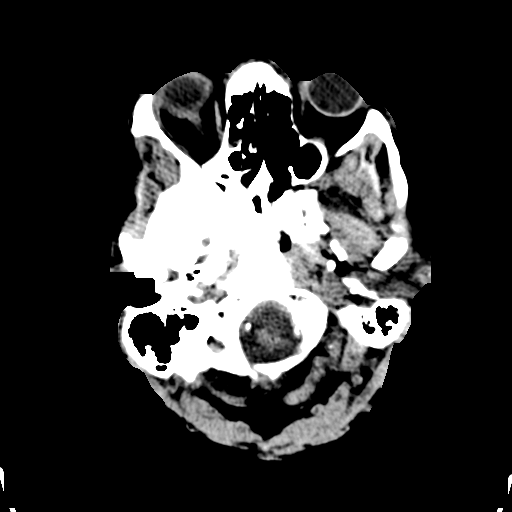
[im 7/36  brain]
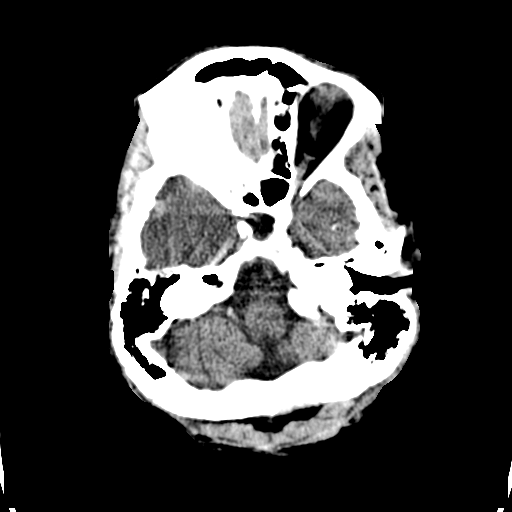
[im 9/36  brain]
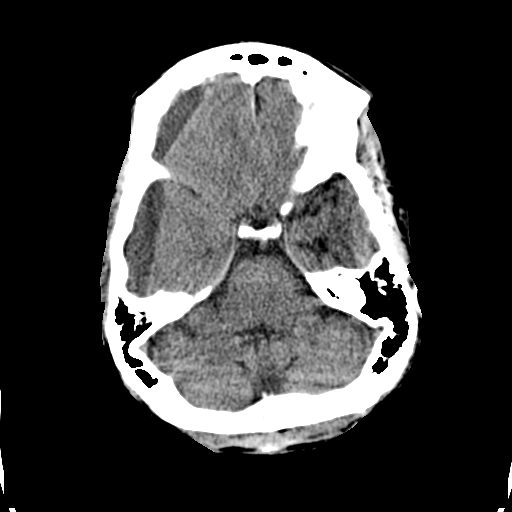
[im 10/36  brain]
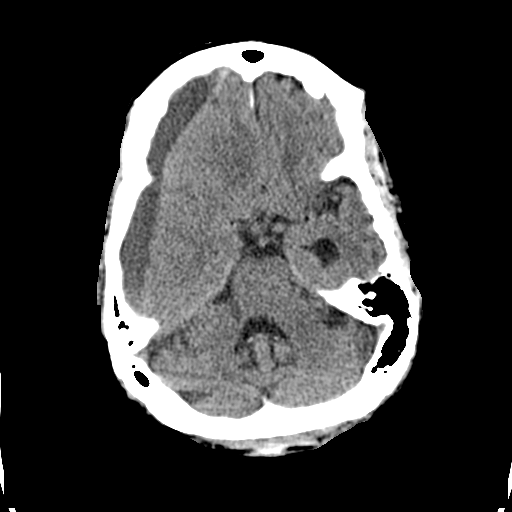
[im 10/36  bone]
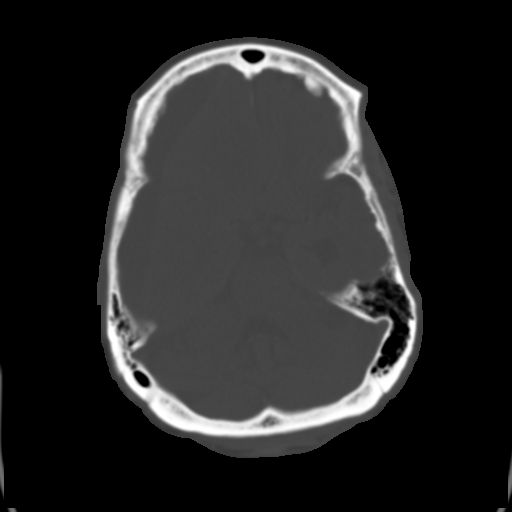
[im 13/36  brain]
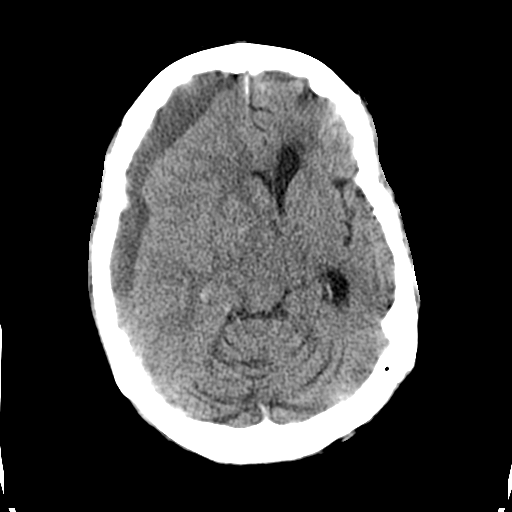
[im 15/36  brain]
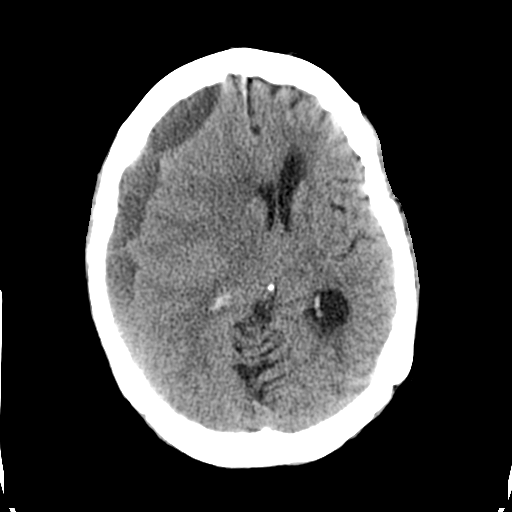
[im 17/36  brain]
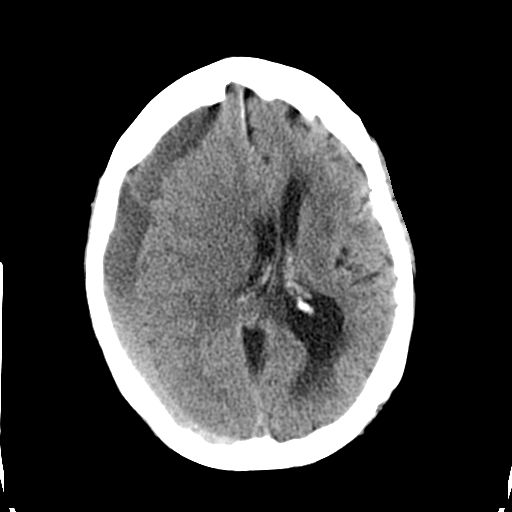
[im 19/36  brain]
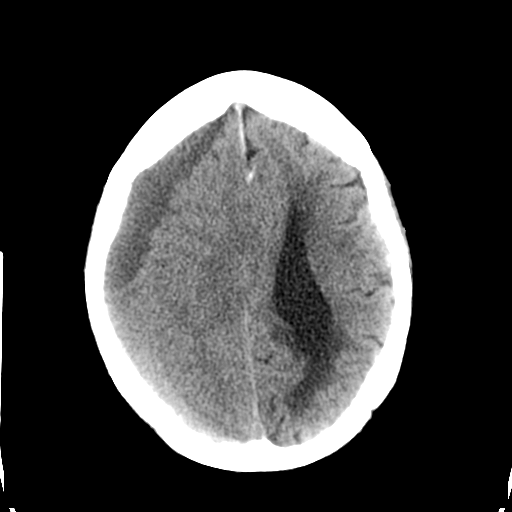
[im 19/36  bone]
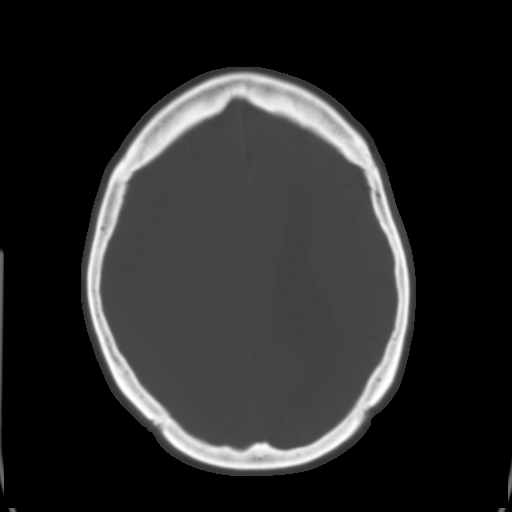
[im 21/36  brain]
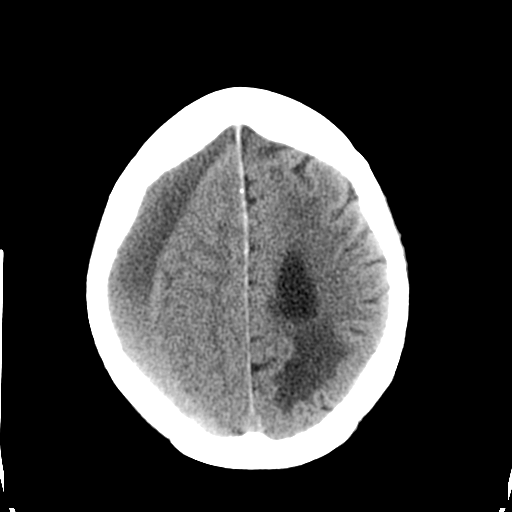
[im 23/36  brain]
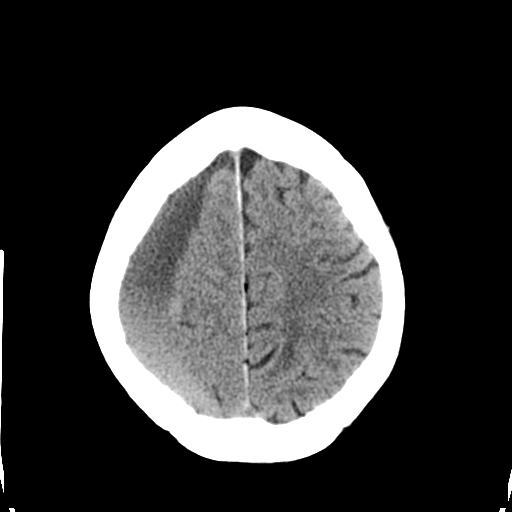
[im 26/36  brain]
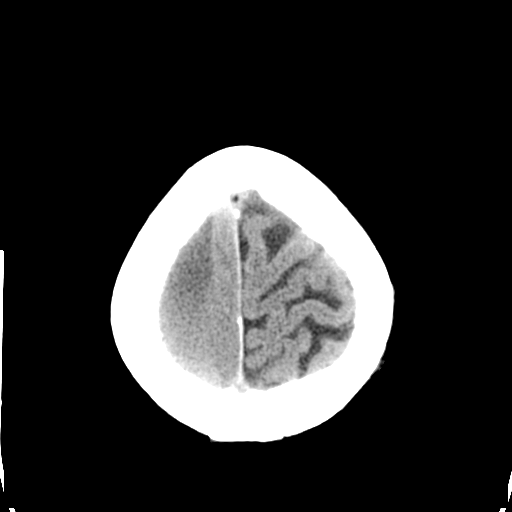
[im 27/36  brain]
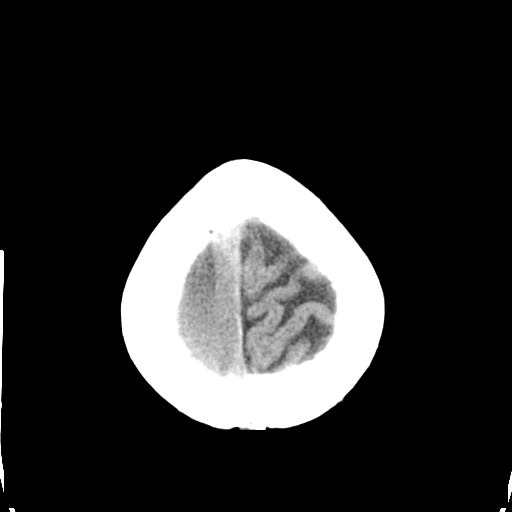
[im 27/36  bone]
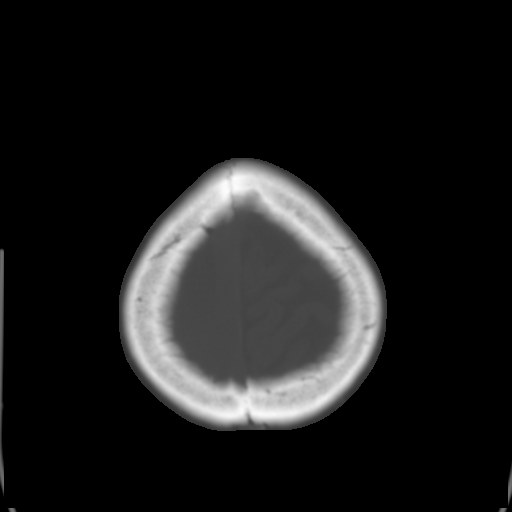
[im 29/36  brain]
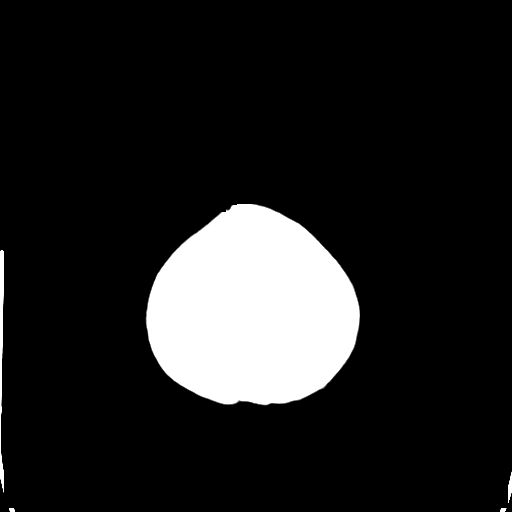
[im 32/36  brain]
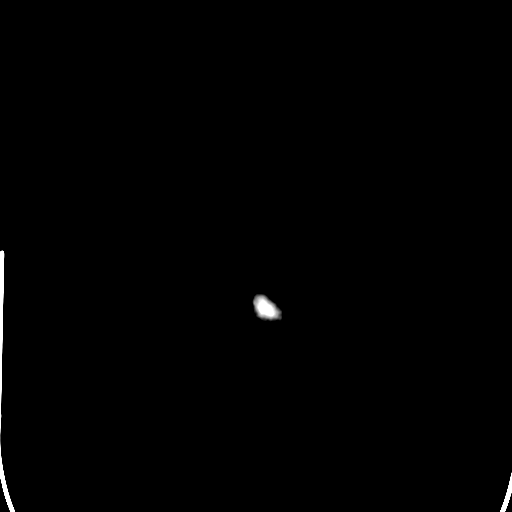
[im 34/36  brain]
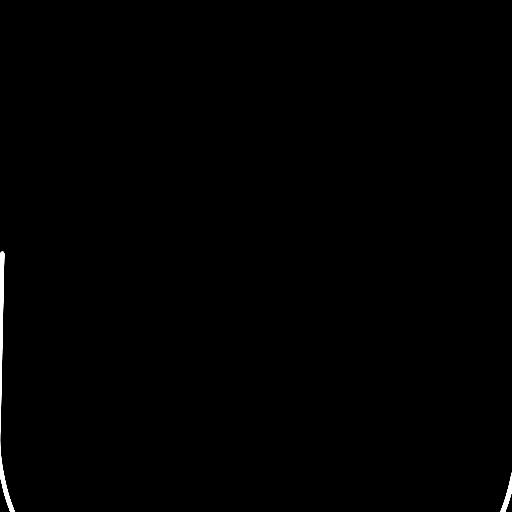

[16 of 30 positions shown; findings below may reference images not displayed]

FINDINGS: Large mixed density subdural hematoma on the right has
increased significantly since the prior study.  There is low
density fluid anteriorly and   high density blood posteriorly.  The
fluid collection measures approximate 14 mm.  There is mass effect
on the right hemisphere with 13 mm midline shift.  The left lateral
ventricle is   dilated and there is periventricular white matter
edema in the left parietal white matter.

Negative for mass or acute infarct.
IMPRESSION: Large mixed density subdural hematoma on the right with 13 mm
midline shift.  There is enlargement of the left lateral ventricle.

Critical Value/emergent results were called by telephone at the
time of interpretation on 07/23/2012 at 3547 hours to Dr. Jian Guo,
who verbally acknowledged these results.

## 2013-05-14 IMAGING — CT CT HEAD W/O CM
2 series · 15 of 30 positions shown, 19 images · non-contrast
Comparison: 07/23/2012

CLINICAL DATA: Follow-up subdural evacuation.

CT HEAD WITHOUT CONTRAST
TECHNIQUE: Contiguous axial images were obtained from the base of
the skull through the vertex without contrast.

[Series 2: head w/o · axial · non-contrast · 0.49mm/px · z∈[+131,+261]mm · 13 of 32 slices shown, 17 images]
[im 3/32  brain]
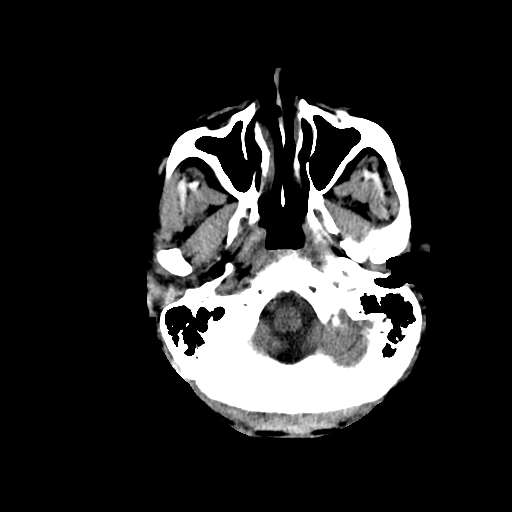
[im 3/32  bone]
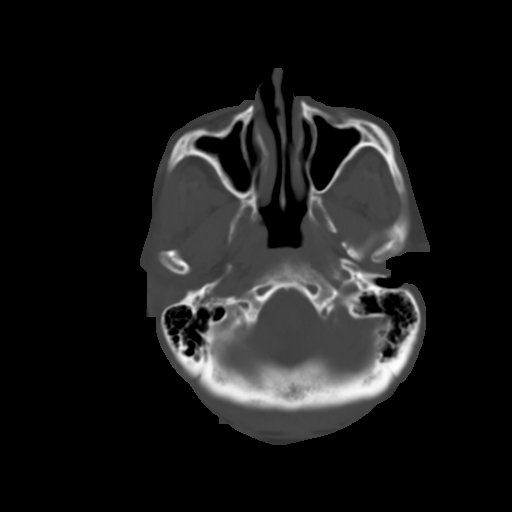
[im 5/32  brain]
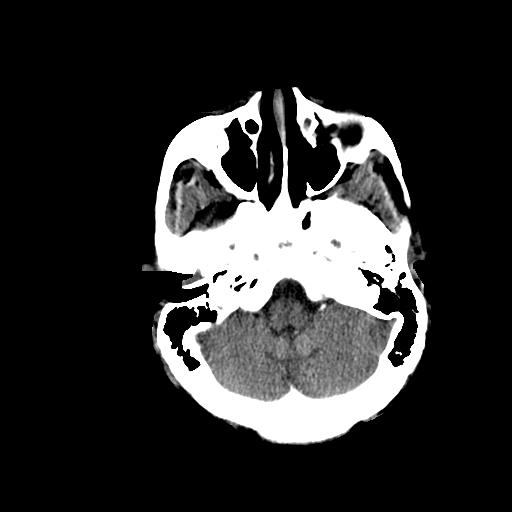
[im 7/32  brain]
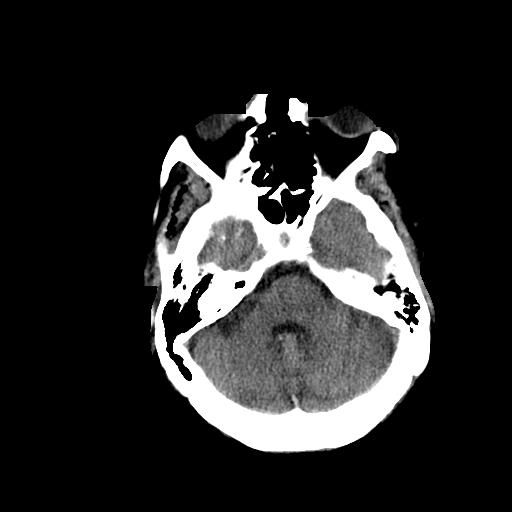
[im 9/32  brain]
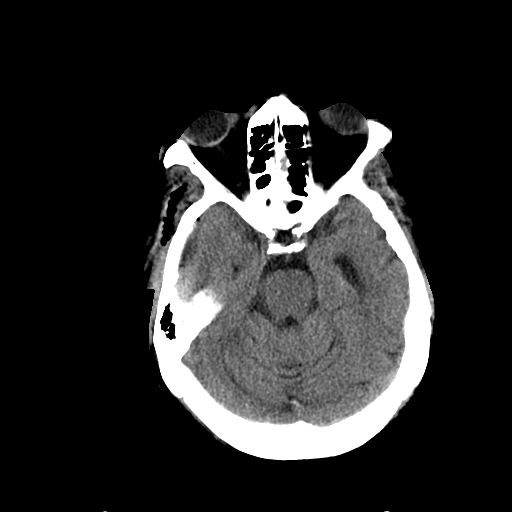
[im 12/32  brain]
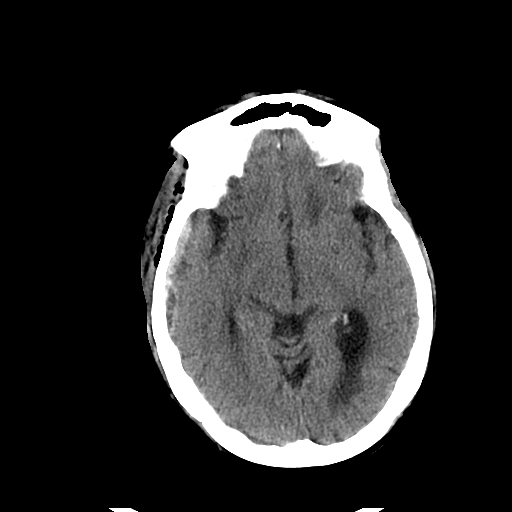
[im 12/32  bone]
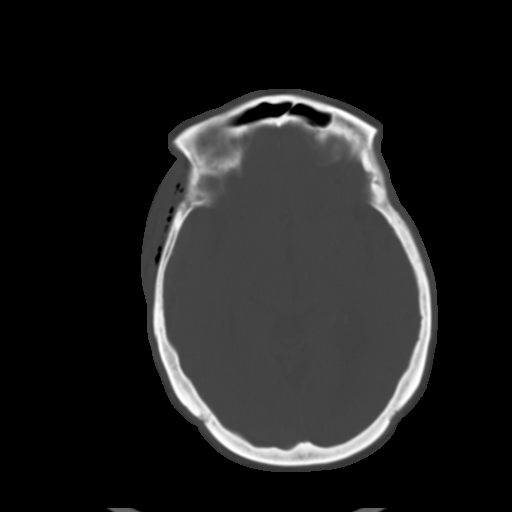
[im 14/32  brain]
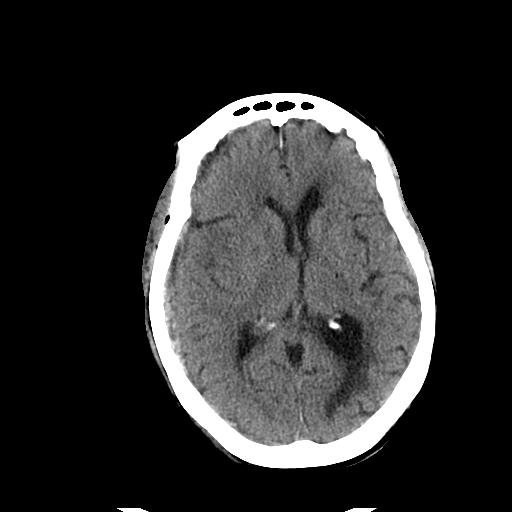
[im 16/32  brain]
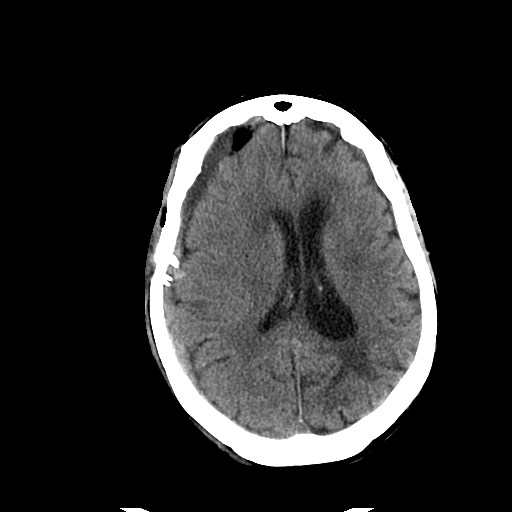
[im 18/32  brain]
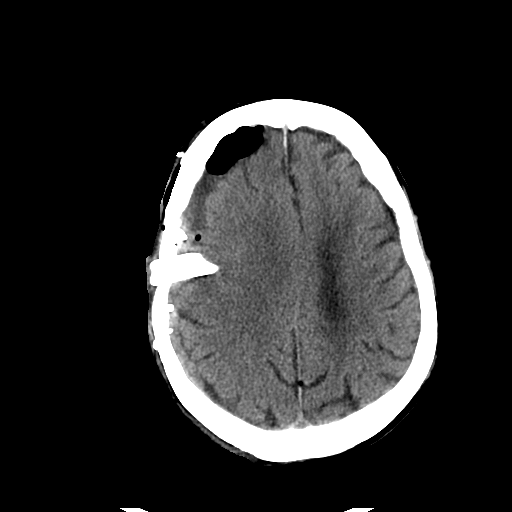
[im 20/32  brain]
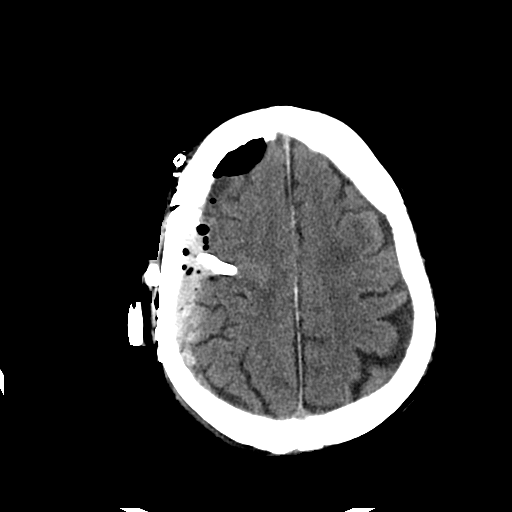
[im 20/32  bone]
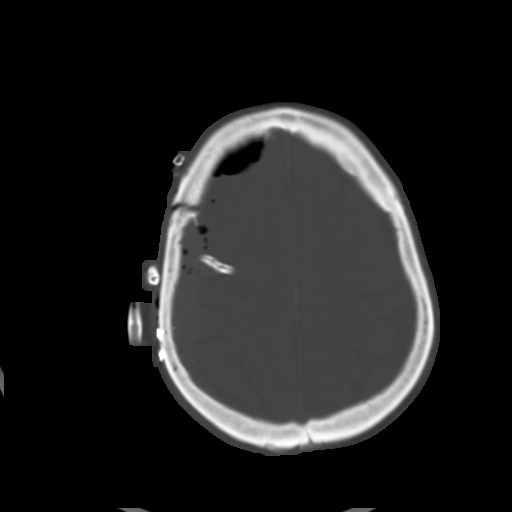
[im 23/32  brain]
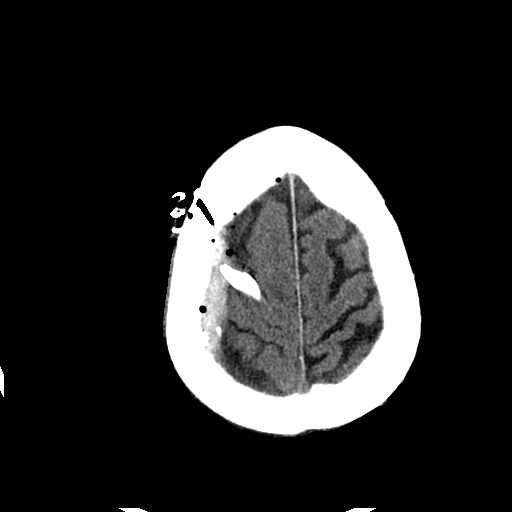
[im 25/32  brain]
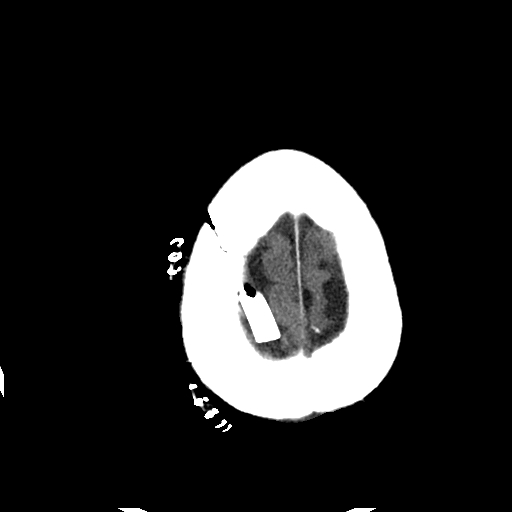
[im 27/32  brain]
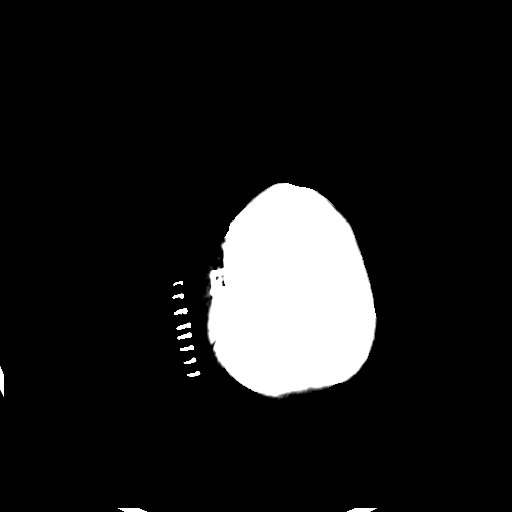
[im 29/32  brain]
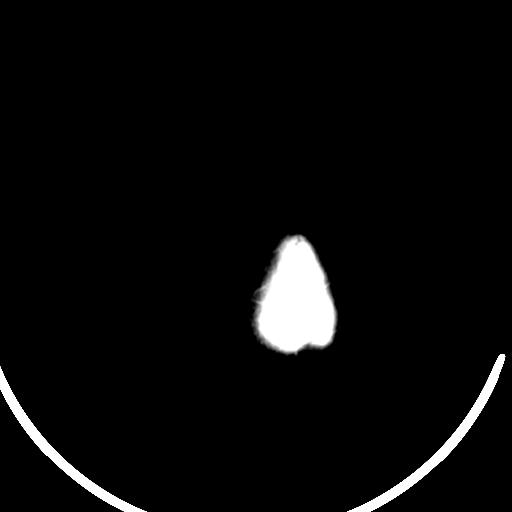
[im 29/32  bone]
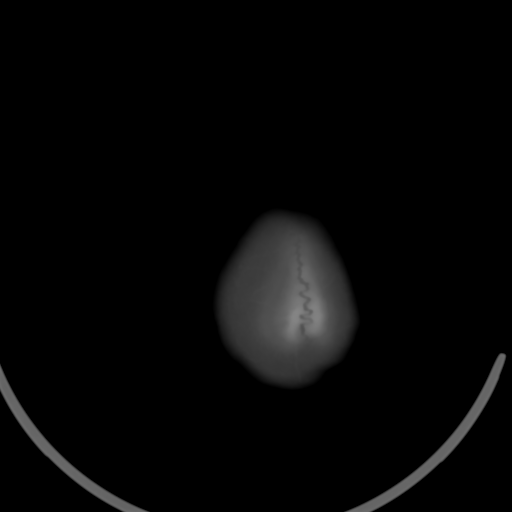

[Series 3: head w/o bone · axial · non-contrast · 0.49mm/px · z∈[+131,+151]mm · 2 of 32 slices shown]
[im 3/32  bone]
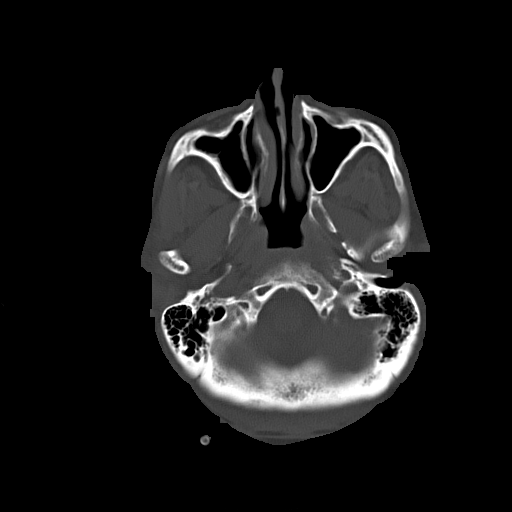
[im 7/32  bone]
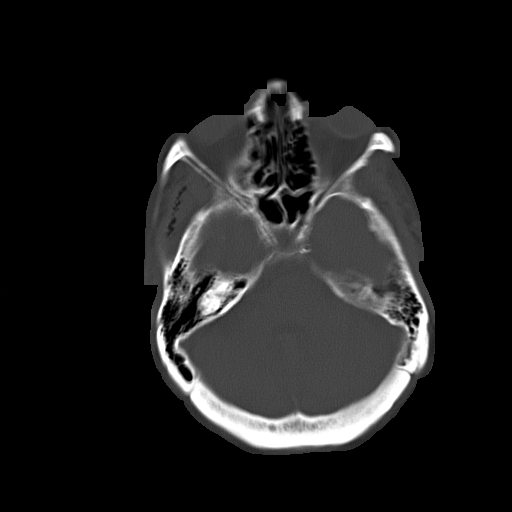

[15 of 30 positions shown; findings below may reference images not displayed]

FINDINGS: The patient is status post evacuation of a right
supratentorial subacute and chronic subdural hematoma.  There is
marked improvement in the degree of midline shift now measuring 4
mm right-to-left.  Moderate pneumocephalus in the subdural space.
Residual subdural hypodense extra-axial fluid less impressive, 8 mm
thickness as measured on image 17, significantly improved from
preoperative status.

Moderate up to 12 mm thickness hyperdense fluid is seen on images
21 - 24 roughly conforms to the undersurface of the craniotomy flap
and could represent a small postoperative epidural hematoma.  The
subdural drain lies medial to this suspected epidural collection.

Mild atrophy and chronic microvascular ischemic change
redemonstrated.
IMPRESSION: Overall significantly improved appearance status post drainage of
right hemisphere subacute to chronic subdural hematoma.

12 mm thick right posterior frontal epidural hematoma suspected,
lying directly under the craniotomy flap.  Correlate clinically.

## 2013-07-16 ENCOUNTER — Ambulatory Visit: Payer: Medicare Other | Attending: Internal Medicine | Admitting: Internal Medicine

## 2013-07-16 ENCOUNTER — Encounter: Payer: Self-pay | Admitting: Internal Medicine

## 2013-07-16 VITALS — BP 212/84 | HR 78 | Temp 98.7°F | Resp 14 | Ht 64.0 in | Wt 125.0 lb

## 2013-07-16 DIAGNOSIS — N189 Chronic kidney disease, unspecified: Secondary | ICD-10-CM

## 2013-07-16 DIAGNOSIS — E785 Hyperlipidemia, unspecified: Secondary | ICD-10-CM | POA: Insufficient documentation

## 2013-07-16 DIAGNOSIS — N039 Chronic nephritic syndrome with unspecified morphologic changes: Secondary | ICD-10-CM

## 2013-07-16 DIAGNOSIS — I10 Essential (primary) hypertension: Secondary | ICD-10-CM

## 2013-07-16 DIAGNOSIS — E119 Type 2 diabetes mellitus without complications: Secondary | ICD-10-CM | POA: Insufficient documentation

## 2013-07-16 DIAGNOSIS — I129 Hypertensive chronic kidney disease with stage 1 through stage 4 chronic kidney disease, or unspecified chronic kidney disease: Secondary | ICD-10-CM | POA: Insufficient documentation

## 2013-07-16 DIAGNOSIS — Z760 Encounter for issue of repeat prescription: Secondary | ICD-10-CM | POA: Insufficient documentation

## 2013-07-16 DIAGNOSIS — N183 Chronic kidney disease, stage 3 unspecified: Secondary | ICD-10-CM | POA: Insufficient documentation

## 2013-07-16 DIAGNOSIS — D631 Anemia in chronic kidney disease: Secondary | ICD-10-CM | POA: Insufficient documentation

## 2013-07-16 DIAGNOSIS — E559 Vitamin D deficiency, unspecified: Secondary | ICD-10-CM

## 2013-07-16 LAB — CBC WITH DIFFERENTIAL/PLATELET
BASOS ABS: 0.1 10*3/uL (ref 0.0–0.1)
Basophils Relative: 1 % (ref 0–1)
Eosinophils Absolute: 0.1 10*3/uL (ref 0.0–0.7)
Eosinophils Relative: 1 % (ref 0–5)
HCT: 28.4 % — ABNORMAL LOW (ref 36.0–46.0)
Hemoglobin: 9.4 g/dL — ABNORMAL LOW (ref 12.0–15.0)
LYMPHS ABS: 1.7 10*3/uL (ref 0.7–4.0)
LYMPHS PCT: 30 % (ref 12–46)
MCH: 27.5 pg (ref 26.0–34.0)
MCHC: 33.1 g/dL (ref 30.0–36.0)
MCV: 83 fL (ref 78.0–100.0)
Monocytes Absolute: 0.4 10*3/uL (ref 0.1–1.0)
Monocytes Relative: 7 % (ref 3–12)
NEUTROS ABS: 3.4 10*3/uL (ref 1.7–7.7)
NEUTROS PCT: 61 % (ref 43–77)
PLATELETS: 345 10*3/uL (ref 150–400)
RBC: 3.42 MIL/uL — AB (ref 3.87–5.11)
RDW: 17.5 % — ABNORMAL HIGH (ref 11.5–15.5)
WBC: 5.6 10*3/uL (ref 4.0–10.5)

## 2013-07-16 LAB — LIPID PANEL
Cholesterol: 250 mg/dL — ABNORMAL HIGH (ref 0–200)
HDL: 47 mg/dL (ref >39)
LDL Cholesterol: 147 mg/dL — ABNORMAL HIGH (ref 0–99)
TRIGLYCERIDES: 281 mg/dL — AB (ref <150)
Total CHOL/HDL Ratio: 5.3 Ratio
VLDL: 56 mg/dL — ABNORMAL HIGH (ref 0–40)

## 2013-07-16 LAB — COMPLETE METABOLIC PANEL WITH GFR
ALT: 22 U/L (ref 0–35)
AST: 30 U/L (ref 0–37)
Albumin: 3 g/dL — ABNORMAL LOW (ref 3.5–5.2)
Alkaline Phosphatase: 88 U/L (ref 39–117)
BUN: 67 mg/dL — AB (ref 6–23)
CALCIUM: 9.3 mg/dL (ref 8.4–10.5)
CHLORIDE: 105 meq/L (ref 96–112)
CO2: 21 meq/L (ref 19–32)
CREATININE: 6.1 mg/dL — AB (ref 0.50–1.10)
GFR, EST AFRICAN AMERICAN: 6 mL/min — AB
GFR, EST NON AFRICAN AMERICAN: 6 mL/min — AB
GLUCOSE: 202 mg/dL — AB (ref 70–99)
Potassium: 4.7 mEq/L (ref 3.5–5.3)
Sodium: 140 mEq/L (ref 135–145)
Total Bilirubin: 0.3 mg/dL (ref 0.2–1.2)
Total Protein: 6.3 g/dL (ref 6.0–8.3)

## 2013-07-16 LAB — TSH: TSH: 3.349 u[IU]/mL (ref 0.350–4.500)

## 2013-07-16 LAB — POCT GLYCOSYLATED HEMOGLOBIN (HGB A1C): Hemoglobin A1C: 6.6

## 2013-07-16 MED ORDER — POLYETHYLENE GLYCOL 3350 17 GM/SCOOP PO POWD: 17.0000 g | Freq: Every day | ORAL | Status: AC

## 2013-07-16 MED ORDER — ATORVASTATIN CALCIUM 40 MG PO TABS: 40.0000 mg | ORAL_TABLET | Freq: Every evening | ORAL | Status: AC

## 2013-07-16 MED ORDER — CLONIDINE HCL 0.2 MG PO TABS: 0.1000 mg | ORAL_TABLET | Freq: Three times a day (TID) | ORAL | Status: AC

## 2013-07-16 MED ORDER — HYDRALAZINE HCL 10 MG PO TABS: 10.0000 mg | ORAL_TABLET | Freq: Three times a day (TID) | ORAL | Status: DC

## 2013-07-16 MED ORDER — METOPROLOL TARTRATE 25 MG PO TABS: 25.0000 mg | ORAL_TABLET | Freq: Two times a day (BID) | ORAL | Status: AC

## 2013-07-16 MED ORDER — AMLODIPINE BESYLATE 10 MG PO TABS: 10.0000 mg | ORAL_TABLET | Freq: Every day | ORAL | Status: AC

## 2013-07-16 MED ORDER — GLIPIZIDE 5 MG PO TABS: 2.5000 mg | ORAL_TABLET | Freq: Every day | ORAL | Status: DC

## 2013-07-16 MED ORDER — PRAVASTATIN SODIUM 20 MG PO TABS: 10.0000 mg | ORAL_TABLET | Freq: Every day | ORAL | Status: DC

## 2013-07-16 NOTE — Progress Notes (Signed)
Patient ID: April [name], female   DOB: 1922-01-04, 80 y.o.   MRN: [id number]   CC:  HPI: 80 year old female with a history of hypertension, diabetes or dyslipidemia, CK D. stage III, status post craniotomy with removal of chronic subdural hematoma in February with recurrence of subcutaneous hematoma in March with recent cranioplasty, history of DVT filter, who presents to the clinic for a followup of hypertension blood pressure still uncontrolled. The son who accompanies the patient states that she has not been able to fill her Diovan prescription for the last couple of days. She has been compliant with other medications he does not report any headache slurred speech blurry vision unilateral weakness.  She was hospitalized on 8/14 for brain syndrome (Stroke vs PRESS. Denies any chest pain shortness of breath She has chronic kidney disease and I explained to the patient's it is probably not a dialysis candidate however she should be seen by a nephrologist to establish care  Currently she is on any treatment for diabetes with her last A1c was 8.7.  No Known Allergies Past Medical History  Diagnosis Date  . Diabetes mellitus   . Hypertension   . Hyperlipidemia   . Pneumonia   . Subdural hematoma   . Arthritis   . DVT (deep venous thrombosis)   . Incontinence   . Renal disorder     chronic kidney dz stage III   Current Outpatient Prescriptions on File Prior to Visit  Medication Sig Dispense Refill  . fish oil-omega-3 fatty acids 1000 MG capsule Take 2 capsules (2 g total) by mouth daily.  30 capsule  6  . Multiple Vitamin (MULTIVITAMIN WITH MINERALS) TABS tablet Take 1 tablet by mouth daily.  30 tablet  6   No current facility-administered medications on file prior to visit.   Family History  Problem Relation Age of Onset  . Diabetes Mellitus II Son    History   Social History  . Marital Status: Divorced    Spouse Name: N/A    Number of Children: N/A  . Years of Education: N/A    Occupational History  . Not on file.   Social History Main Topics  . Smoking status: Never Smoker   . Smokeless tobacco: Never Used  . Alcohol Use: No  . Drug Use: No  . Sexual Activity: Not on file   Other Topics Concern  . Not on file   Social History Narrative  . No narrative on file    Review of Systems  Constitutional: Negative for fever, chills, diaphoresis, activity change, appetite change and fatigue.  HENT: Negative for ear pain, nosebleeds, congestion, facial swelling, rhinorrhea, neck pain, neck stiffness and ear discharge.   Eyes: Negative for pain, discharge, redness, itching and visual disturbance.  Respiratory: Negative for cough, choking, chest tightness, shortness of breath, wheezing and stridor.   Cardiovascular: Negative for chest pain, palpitations and leg swelling.  Gastrointestinal: Constipation Genitourinary: Negative for dysuria, urgency, frequency, hematuria, flank pain, decreased urine volume, difficulty urinating and dyspareunia.  Musculoskeletal: Negative for back pain, joint swelling, arthralgias and gait problem.  Neurological: Negative for dizziness, tremors, seizures, syncope, facial asymmetry, speech difficulty, weakness, light-headedness, numbness and headaches.  Hematological: Negative for adenopathy. Does not bruise/bleed easily.  Psychiatric/Behavioral: Negative for hallucinations, behavioral problems, confusion, dysphoric mood, decreased concentration and agitation.    Objective:   Filed Vitals:   07/16/13 1115  BP: 212/84  Pulse: 78  Temp: 98.7 F (37.1 C)  Resp: 14    Physical Exam  Constitutional: Chronically ill-appearing, wheelchair-bound No distress.  HENT: Normocephalic. External right and left ear normal. Oropharynx is clear and moist.  Eyes: Conjunctivae and EOM are normal. PERRLA, no scleral icterus.  Neck: Normal ROM. Neck supple. No JVD. No tracheal deviation. No thyromegaly.  CVS: RRR, S1/S2 +, no murmurs, no  gallops, no carotid bruit.  Pulmonary: Effort and breath sounds normal, no stridor, rhonchi, wheezes, rales.  Abdominal: Soft. BS +,  no distension, tenderness, rebound or guarding.  Musculoskeletal: Normal range of motion. No edema and no tenderness.  Lymphadenopathy: No lymphadenopathy noted, cervical, inguinal. Neuro: Alert. Normal reflexes, muscle tone coordination. No cranial nerve deficit. Skin: Skin is warm and dry. No rash noted. Not diaphoretic. No erythema. No pallor.  Psychiatric: Normal mood and affect. Behavior, judgment, thought content normal.   Lab Results  Component Value Date   WBC 7.1 02/11/2013   HGB 10.1* 02/11/2013   HCT 29.8* 02/11/2013   MCV 80.8 02/11/2013   PLT 333 02/11/2013   Lab Results  Component Value Date   CREATININE 3.04* 02/11/2013   BUN 31* 02/11/2013   NA 146* 02/11/2013   K 3.0* 02/11/2013   CL 108 02/11/2013   CO2 27 02/11/2013    Lab Results  Component Value Date   HGBA1C 8.7* 01/09/2013   Lipid Panel     Component Value Date/Time   CHOL 233* 01/09/2013 0610   TRIG 190* 01/09/2013 0610   HDL 45 01/09/2013 0610   CHOLHDL 5.2 01/09/2013 0610   VLDL 38 01/09/2013 0610   LDLCALC 150* 01/09/2013 0610       Assessment and plan:   Patient Active Problem List   Diagnosis Date Noted  . Acute encephalopathy 01/09/2013  . Hypokalemia 01/09/2013  . Subdural hemorrhage 08/14/2012  . Subdural hematoma 07/30/2012  . DVT (deep venous thrombosis) 07/04/2012  . CKD (chronic kidney disease) stage 3, GFR 30-59 ml/min 07/04/2012  . Lower extremity edema 07/03/2012  . ARF (acute renal failure) 07/03/2012  . HTN (hypertension) 07/03/2012  . SDH (subdural hematoma) 06/21/2012  . Diabetes mellitus 06/20/2012  . Hyperlipidemia 06/20/2012   Accelerated hypertension Will give one dose of clonidine Patient has received refills on all medications Son endorses compliance otherwise   Chronic kidney disease Repeat renal function Patient also with anemia of chronic  disease Currently on ARB Nephrology referral provided Probably not a good dialysis candidate   Diabetes Last A1c was 8.7 A candidate for metformin or insulin We'll start her on low-dose glipizide 2.5 mg a day   Follow up in 2 months         The patient was given clear instructions to go to ER or return to medical center if symptoms don't improve, worsen or new problems develop. The patient verbalized understanding. The patient was told to call to get any lab results if not heard anything in the next week.

## 2013-07-16 NOTE — Progress Notes (Signed)
Pt is here for an office visit. Requests a check up and medication refills for HTN. Hasn't had HTN medication x3 days. BP today is 212/84.

## 2013-07-16 NOTE — Addendum Note (Signed)
Addended by: Susie CassetteABROL MD, Germain OsgoodNAYANA on: 07/16/2013 11:36 AM   Modules accepted: Orders

## 2013-07-17 ENCOUNTER — Telehealth: Payer: Self-pay | Admitting: *Deleted

## 2013-07-17 ENCOUNTER — Telehealth: Payer: Self-pay | Admitting: Emergency Medicine

## 2013-07-17 NOTE — Telephone Encounter (Signed)
Attempted to reach pt on both number listed but no response. Will retry tomorrow

## 2013-07-17 NOTE — Telephone Encounter (Signed)
Left a voicemail for patient to go to the ER as soon as possible.

## 2013-07-17 NOTE — Telephone Encounter (Signed)
Message copied by Hasheem Voland, [place] on Wed Jul 17, 2013  2:24 PM ------      Message from: Susie CassetteABROL MD, [facility]      Created: Wed Jul 17, 2013  1:22 PM       Notify patient of the labs are significantly abnormal. The patient's creatinine is 6.10. This is the highest it has ever been. The patient needs to go to the ER for further evaluation and possible admission for acute renal failure and nephrology consultation. She may also need a renal ultrasound and IV hydration. Please notify the patient immediately ------

## 2013-07-19 ENCOUNTER — Telehealth: Payer: Self-pay | Admitting: Emergency Medicine

## 2013-07-19 ENCOUNTER — Encounter (HOSPITAL_COMMUNITY): Payer: Self-pay | Admitting: Emergency Medicine

## 2013-07-19 ENCOUNTER — Inpatient Hospital Stay (HOSPITAL_COMMUNITY)
Admission: EM | Admit: 2013-07-19 | Discharge: 2013-07-26 | DRG: 872 | Disposition: A | Discharge status: Hospice | Payer: Medicare Other | Attending: Internal Medicine | Admitting: Internal Medicine

## 2013-07-19 DIAGNOSIS — Z515 Encounter for palliative care: Secondary | ICD-10-CM

## 2013-07-19 DIAGNOSIS — S065X9A Traumatic subdural hemorrhage with loss of consciousness of unspecified duration, initial encounter: Secondary | ICD-10-CM

## 2013-07-19 DIAGNOSIS — I82409 Acute embolism and thrombosis of unspecified deep veins of unspecified lower extremity: Secondary | ICD-10-CM

## 2013-07-19 DIAGNOSIS — N179 Acute kidney failure, unspecified: Secondary | ICD-10-CM | POA: Diagnosis present

## 2013-07-19 DIAGNOSIS — N390 Urinary tract infection, site not specified: Secondary | ICD-10-CM | POA: Diagnosis present

## 2013-07-19 DIAGNOSIS — Z86718 Personal history of other venous thrombosis and embolism: Secondary | ICD-10-CM

## 2013-07-19 DIAGNOSIS — A419 Sepsis, unspecified organism: Principal | ICD-10-CM | POA: Diagnosis present

## 2013-07-19 DIAGNOSIS — I10 Essential (primary) hypertension: Secondary | ICD-10-CM

## 2013-07-19 DIAGNOSIS — N183 Chronic kidney disease, stage 3 unspecified: Secondary | ICD-10-CM | POA: Diagnosis present

## 2013-07-19 DIAGNOSIS — D631 Anemia in chronic kidney disease: Secondary | ICD-10-CM | POA: Diagnosis present

## 2013-07-19 DIAGNOSIS — I129 Hypertensive chronic kidney disease with stage 1 through stage 4 chronic kidney disease, or unspecified chronic kidney disease: Secondary | ICD-10-CM | POA: Diagnosis present

## 2013-07-19 DIAGNOSIS — T68XXXA Hypothermia, initial encounter: Secondary | ICD-10-CM | POA: Diagnosis present

## 2013-07-19 DIAGNOSIS — N189 Chronic kidney disease, unspecified: Secondary | ICD-10-CM

## 2013-07-19 DIAGNOSIS — E785 Hyperlipidemia, unspecified: Secondary | ICD-10-CM | POA: Diagnosis present

## 2013-07-19 DIAGNOSIS — F0390 Unspecified dementia without behavioral disturbance: Secondary | ICD-10-CM | POA: Diagnosis present

## 2013-07-19 DIAGNOSIS — G9340 Encephalopathy, unspecified: Secondary | ICD-10-CM

## 2013-07-19 DIAGNOSIS — E119 Type 2 diabetes mellitus without complications: Secondary | ICD-10-CM | POA: Diagnosis present

## 2013-07-19 DIAGNOSIS — N039 Chronic nephritic syndrome with unspecified morphologic changes: Secondary | ICD-10-CM

## 2013-07-19 DIAGNOSIS — R680 Hypothermia, not associated with low environmental temperature: Secondary | ICD-10-CM | POA: Diagnosis present

## 2013-07-19 DIAGNOSIS — D649 Anemia, unspecified: Secondary | ICD-10-CM

## 2013-07-19 DIAGNOSIS — S065XAA Traumatic subdural hemorrhage with loss of consciousness status unknown, initial encounter: Secondary | ICD-10-CM

## 2013-07-19 DIAGNOSIS — E876 Hypokalemia: Secondary | ICD-10-CM

## 2013-07-19 DIAGNOSIS — E8809 Other disorders of plasma-protein metabolism, not elsewhere classified: Secondary | ICD-10-CM | POA: Diagnosis present

## 2013-07-19 DIAGNOSIS — R600 Localized edema: Secondary | ICD-10-CM

## 2013-07-19 DIAGNOSIS — Z66 Do not resuscitate: Secondary | ICD-10-CM | POA: Diagnosis present

## 2013-07-19 DIAGNOSIS — I6200 Nontraumatic subdural hemorrhage, unspecified: Secondary | ICD-10-CM

## 2013-07-19 DIAGNOSIS — B961 Klebsiella pneumoniae [K. pneumoniae] as the cause of diseases classified elsewhere: Secondary | ICD-10-CM | POA: Diagnosis present

## 2013-07-19 DIAGNOSIS — Z79899 Other long term (current) drug therapy: Secondary | ICD-10-CM

## 2013-07-19 LAB — COMPREHENSIVE METABOLIC PANEL
ALBUMIN: 2.5 g/dL — AB (ref 3.5–5.2)
ALT: 23 U/L (ref 0–35)
AST: 26 U/L (ref 0–37)
Alkaline Phosphatase: 80 U/L (ref 39–117)
BUN: 68 mg/dL — ABNORMAL HIGH (ref 6–23)
CALCIUM: 9 mg/dL (ref 8.4–10.5)
CO2: 23 mEq/L (ref 19–32)
CREATININE: 6.24 mg/dL — AB (ref 0.50–1.10)
Chloride: 108 mEq/L (ref 96–112)
GFR calc Af Amer: 6 mL/min — ABNORMAL LOW (ref >90)
GFR calc non Af Amer: 5 mL/min — ABNORMAL LOW (ref >90)
GLUCOSE: 144 mg/dL — AB (ref 70–99)
Potassium: 3.8 mEq/L (ref 3.7–5.3)
Sodium: 147 mEq/L (ref 137–147)
TOTAL PROTEIN: 6.3 g/dL (ref 6.0–8.3)
Total Bilirubin: <0.2 mg/dL — ABNORMAL LOW (ref 0.3–1.2)

## 2013-07-19 LAB — CBC WITH DIFFERENTIAL/PLATELET
BASOS ABS: 0 10*3/uL (ref 0.0–0.1)
BASOS PCT: 1 % (ref 0–1)
EOS PCT: 1 % (ref 0–5)
Eosinophils Absolute: 0.1 10*3/uL (ref 0.0–0.7)
HCT: 23.7 % — ABNORMAL LOW (ref 36.0–46.0)
Hemoglobin: 8 g/dL — ABNORMAL LOW (ref 12.0–15.0)
LYMPHS PCT: 25 % (ref 12–46)
Lymphs Abs: 1.1 10*3/uL (ref 0.7–4.0)
MCH: 27.8 pg (ref 26.0–34.0)
MCHC: 33.8 g/dL (ref 30.0–36.0)
MCV: 82.3 fL (ref 78.0–100.0)
Monocytes Absolute: 0.3 10*3/uL (ref 0.1–1.0)
Monocytes Relative: 6 % (ref 3–12)
NEUTROS ABS: 2.9 10*3/uL (ref 1.7–7.7)
Neutrophils Relative %: 68 % (ref 43–77)
Platelets: 362 10*3/uL (ref 150–400)
RBC: 2.88 MIL/uL — ABNORMAL LOW (ref 3.87–5.11)
RDW: 15.7 % — AB (ref 11.5–15.5)
WBC: 4.3 10*3/uL (ref 4.0–10.5)

## 2013-07-19 NOTE — Telephone Encounter (Signed)
Attempted to reach pt # 2 in regards to urgent lab results and instructions to go to ER for Iv hydration and further work-up. No answer on both numbers listed.

## 2013-07-19 NOTE — ED Notes (Addendum)
New lab orders added on to blood in lab, Velna HatchetSheila in lab notified. Recent lab result hx reviewed. Pt calm, NAD[facility]

## 2013-07-19 NOTE — ED Provider Notes (Signed)
CSN: [id number]     Arrival date & time 07/19/13  1840 History   First MD Initiated Contact with Patient 07/19/13 2125     Chief Complaint  Patient presents with  . Abnormal Lab     (Consider location/radiation/quality/duration/timing/severity/associated sxs/prior Treatment) HPI Comments: Pt brought in by family member after their physician called and said she had abnormal blood work done. She was just at her doctor's office for routine check. Family member states that she has her ups and downs per her behavior, eating patterns have not changed. They have noticed worsening smell and darkening of her urine. Normal bowel movements. No recent medication changes.  The history is provided by a relative and a caregiver.    Past Medical History  Diagnosis Date  . Diabetes mellitus   . Hypertension   . Hyperlipidemia   . Pneumonia   . Subdural hematoma   . Arthritis   . DVT (deep venous thrombosis)   . Incontinence   . Renal disorder     chronic kidney dz stage III   Past Surgical History  Procedure Laterality Date  . Insertion of vena cava filter    . Craniotomy Right 07/23/2012    Procedure: CRANIOTOMY HEMATOMA EVACUATION SUBDURAL;  Surgeon: Hewitt Shortsobert W Nudelman, MD;  Location: [facility];  Service: Neurosurgery;  Laterality: Right;  . Craniotomy N/A 08/06/2012    Procedure: CRANIOTOMY HEMATOMA EVACUATION SUBDURAL;  Surgeon: Hewitt Shortsobert W Nudelman, MD;  Location: [facility];  Service: Neurosurgery;  Laterality: N/A;  Craniectomy for evacuation of subdural hematoma, implantation of bone flap in abdominal wall   . Brain surgery    . Craniotomy N/A 12/12/2012    Procedure: CRANIOTOMY BONE FLAP/PROSTHETIC PLATE retrieval of bone from abdomen;  Surgeon: Hewitt Shortsobert W Nudelman, MD;  Location: [facility];  Service: Neurosurgery;  Laterality: N/A;  cranioplasty with retrieval of bone flap from abdominal wall pocket. Site is head and abdomen.   Family History  Problem Relation Age of Onset  . Diabetes  Mellitus II Son    History  Substance Use Topics  . Smoking status: Never Smoker   . Smokeless tobacco: Never Used  . Alcohol Use: No   OB History   Grav Para Term Preterm Abortions TAB SAB Ect Mult Living                 Review of Systems  All other systems reviewed and are negative.      Allergies  Review of patient's allergies indicates no known allergies.  Home Medications   Current Outpatient Rx  Name  Route  Sig  Dispense  Refill  . amLODipine (NORVASC) 10 MG tablet   Oral   Take 1 tablet (10 mg total) by mouth daily.   30 tablet   3   . atorvastatin (LIPITOR) 40 MG tablet   Oral   Take 1 tablet (40 mg total) by mouth every evening.   30 tablet   3   . cloNIDine (CATAPRES) 0.2 MG tablet   Oral   Take 0.5 tablets (0.1 mg total) by mouth 3 (three) times daily.   90 tablet   3   . fish oil-omega-3 fatty acids 1000 MG capsule   Oral   Take 2 capsules (2 g total) by mouth daily.   30 capsule   6   . hydrALAZINE (APRESOLINE) 10 MG tablet   Oral   Take 1 tablet (10 mg total) by mouth every 8 (eight) hours.   90 tablet  3   . metoprolol tartrate (LOPRESSOR) 25 MG tablet   Oral   Take 1 tablet (25 mg total) by mouth 2 (two) times daily.   60 tablet   6   . Multiple Vitamin (MULTIVITAMIN WITH MINERALS) TABS tablet   Oral   Take 1 tablet by mouth daily.   30 tablet   6   . polyethylene glycol powder (MIRALAX) powder   Oral   Take 17 g by mouth daily.   255 g   6   . pravastatin (PRAVACHOL) 20 MG tablet   Oral   Take 0.5 tablets (10 mg total) by mouth daily.   30 tablet   3   . Tetrahydrozoline HCl (EYE DROPS OP)   Both Eyes   Place 1 drop into both eyes daily as needed (dry eyes).          BP 184/73  Pulse 58  Temp(Src) 97.4 F (36.3 C) (Oral)  Resp 18  SpO2 100% Physical Exam  Nursing note and vitals reviewed. Constitutional: She appears well-developed and well-nourished. No distress.  HENT:  Head: Normocephalic and  atraumatic.  Mouth/Throat: Oropharynx is clear and moist.  Eyes: Conjunctivae and EOM are normal. Pupils are equal, round, and reactive to light.  Neck: Normal range of motion. Neck supple.  Cardiovascular: Normal rate, regular rhythm and intact distal pulses.   No murmur heard. Pulmonary/Chest: Effort normal and breath sounds normal. No respiratory distress. She has no wheezes. She has no rales.  Abdominal: Soft. She exhibits no distension. There is no tenderness. There is no rebound and no guarding.  Musculoskeletal: Normal range of motion. She exhibits no edema and no tenderness.  Neurological:  Pt is awake but does not answer questions  Skin: Skin is warm and dry. No rash noted. No erythema.  Psychiatric: She has a normal mood and affect. Her behavior is normal.    ED Course  Procedures (including critical care time) Labs Review Labs Reviewed  COMPREHENSIVE METABOLIC PANEL - Abnormal; Notable for the following:    Glucose, Bld 144 (*)    BUN 68 (*)    Creatinine, Ser 6.24 (*)    Albumin 2.5 (*)    Total Bilirubin <0.2 (*)    GFR calc non Af Amer 5 (*)    GFR calc Af Amer 6 (*)    All other components within normal limits  CBC WITH DIFFERENTIAL - Abnormal; Notable for the following:    RBC 2.88 (*)    Hemoglobin 8.0 (*)    HCT 23.7 (*)    RDW 15.7 (*)    All other components within normal limits  URINALYSIS, ROUTINE W REFLEX MICROSCOPIC   Imaging Review No results found.  EKG Interpretation   None       MDM   Final diagnoses:  None    Patient brought in by her caregiver due to being told to come to the emergency room for worsening laboratory values. Patient found to be in acute on chronic renal failure today with a creatinine greater than 6 her baseline is between 2 and 3. Family member states that she has her good days and bad days but no significant change in her mental status, eating habits that day he had noted her urine to be very strong. No recent  medication changes. Patient does not take an ACE inhibitor or any type of anti-inflammatory medication. Blood pressure is elevated here which family member states is always the case.  Repeat CMP today shows  persistent renal failure with a creatinine of 6 and elevated BUN. CBC with anemia which is unchanged. UA pending.     Gwyneth Sprout, MD 07/20/13 [phone]

## 2013-07-19 NOTE — Telephone Encounter (Signed)
Spoke with son Chriss Driverric Hirsch(caregiver/son). [facility] and renal u/s. Son states he will take her now.

## 2013-07-19 NOTE — ED Notes (Signed)
Pt sent over after abnormal renal labs that were drawn three days ago, called by her doctor today, states she was there for a normal visit, denies complaints, no distress noted

## 2013-07-20 ENCOUNTER — Inpatient Hospital Stay (HOSPITAL_COMMUNITY): Payer: Medicare Other

## 2013-07-20 DIAGNOSIS — T68XXXA Hypothermia, initial encounter: Secondary | ICD-10-CM | POA: Diagnosis present

## 2013-07-20 DIAGNOSIS — N189 Chronic kidney disease, unspecified: Secondary | ICD-10-CM

## 2013-07-20 DIAGNOSIS — A419 Sepsis, unspecified organism: Secondary | ICD-10-CM | POA: Diagnosis present

## 2013-07-20 DIAGNOSIS — D649 Anemia, unspecified: Secondary | ICD-10-CM

## 2013-07-20 DIAGNOSIS — N179 Acute kidney failure, unspecified: Secondary | ICD-10-CM | POA: Diagnosis present

## 2013-07-20 DIAGNOSIS — N390 Urinary tract infection, site not specified: Secondary | ICD-10-CM | POA: Diagnosis present

## 2013-07-20 DIAGNOSIS — E8809 Other disorders of plasma-protein metabolism, not elsewhere classified: Secondary | ICD-10-CM | POA: Diagnosis present

## 2013-07-20 DIAGNOSIS — E119 Type 2 diabetes mellitus without complications: Secondary | ICD-10-CM

## 2013-07-20 LAB — FERRITIN: FERRITIN: 457 ng/mL — AB (ref 10–291)

## 2013-07-20 LAB — URINALYSIS, ROUTINE W REFLEX MICROSCOPIC
Bilirubin Urine: NEGATIVE
Glucose, UA: NEGATIVE mg/dL
Ketones, ur: NEGATIVE mg/dL
Nitrite: NEGATIVE
Specific Gravity, Urine: 1.02 (ref 1.005–1.030)
UROBILINOGEN UA: 0.2 mg/dL (ref 0.0–1.0)
pH: 6.5 (ref 5.0–8.0)

## 2013-07-20 LAB — COMPREHENSIVE METABOLIC PANEL
ALT: 20 U/L (ref 0–35)
AST: 22 U/L (ref 0–37)
Albumin: 2.1 g/dL — ABNORMAL LOW (ref 3.5–5.2)
Alkaline Phosphatase: 71 U/L (ref 39–117)
BUN: 71 mg/dL — AB (ref 6–23)
CALCIUM: 8.8 mg/dL (ref 8.4–10.5)
CO2: 20 mEq/L (ref 19–32)
Chloride: 110 mEq/L (ref 96–112)
Creatinine, Ser: 6.03 mg/dL — ABNORMAL HIGH (ref 0.50–1.10)
GFR calc non Af Amer: 6 mL/min — ABNORMAL LOW (ref >90)
GFR, EST AFRICAN AMERICAN: 6 mL/min — AB (ref >90)
GLUCOSE: 94 mg/dL (ref 70–99)
Potassium: 4 mEq/L (ref 3.7–5.3)
Sodium: 146 mEq/L (ref 137–147)
TOTAL PROTEIN: 5.5 g/dL — AB (ref 6.0–8.3)
Total Bilirubin: <0.2 mg/dL — ABNORMAL LOW (ref 0.3–1.2)

## 2013-07-20 LAB — PHOSPHORUS: PHOSPHORUS: 4.2 mg/dL (ref 2.3–4.6)

## 2013-07-20 LAB — GLUCOSE, CAPILLARY
GLUCOSE-CAPILLARY: 122 mg/dL — AB (ref 70–99)
GLUCOSE-CAPILLARY: 96 mg/dL (ref 70–99)
GLUCOSE-CAPILLARY: 83 mg/dL (ref 70–99)
Glucose-Capillary: 79 mg/dL (ref 70–99)
Glucose-Capillary: 86 mg/dL (ref 70–99)

## 2013-07-20 LAB — RETICULOCYTES
RBC.: 2.59 MIL/uL — ABNORMAL LOW (ref 3.87–5.11)
RETIC COUNT ABSOLUTE: 25.9 10*3/uL (ref 19.0–186.0)
RETIC CT PCT: 1 % (ref 0.4–3.1)

## 2013-07-20 LAB — CBC
HCT: 21.3 % — ABNORMAL LOW (ref 36.0–46.0)
HEMOGLOBIN: 7.3 g/dL — AB (ref 12.0–15.0)
MCH: 28.2 pg (ref 26.0–34.0)
MCHC: 34.3 g/dL (ref 30.0–36.0)
MCV: 82.2 fL (ref 78.0–100.0)
PLATELETS: 311 10*3/uL (ref 150–400)
RBC: 2.59 MIL/uL — ABNORMAL LOW (ref 3.87–5.11)
RDW: 16.2 % — ABNORMAL HIGH (ref 11.5–15.5)
WBC: 4.8 10*3/uL (ref 4.0–10.5)

## 2013-07-20 LAB — VITAMIN B12: Vitamin B-12: >2000 pg/mL — ABNORMAL HIGH (ref 211–911)

## 2013-07-20 LAB — LACTIC ACID, PLASMA: LACTIC ACID, VENOUS: 1.2 mmol/L (ref 0.5–2.2)

## 2013-07-20 LAB — FOLATE: Folate: >20 ng/mL

## 2013-07-20 LAB — URINE MICROSCOPIC-ADD ON

## 2013-07-20 LAB — TROPONIN I
Troponin I: <0.3 ng/mL (ref <0.30)
Troponin I: <0.3 ng/mL (ref <0.30)

## 2013-07-20 LAB — IRON AND TIBC
IRON: 48 ug/dL (ref 42–135)
Saturation Ratios: 27 % (ref 20–55)
TIBC: 175 ug/dL — ABNORMAL LOW (ref 250–470)
UIBC: 127 ug/dL (ref 125–400)

## 2013-07-20 LAB — TYPE AND SCREEN
ABO/RH(D): B POS
Antibody Screen: NEGATIVE

## 2013-07-20 LAB — PREALBUMIN: Prealbumin: 24.8 mg/dL (ref 17.0–34.0)

## 2013-07-20 LAB — HEMOGLOBIN A1C
Hgb A1c MFr Bld: 6.8 % — ABNORMAL HIGH (ref <5.7)
Mean Plasma Glucose: 148 mg/dL — ABNORMAL HIGH (ref <117)

## 2013-07-20 LAB — MAGNESIUM: MAGNESIUM: 2 mg/dL (ref 1.5–2.5)

## 2013-07-20 LAB — TSH: TSH: 3.05 u[IU]/mL (ref 0.350–4.500)

## 2013-07-20 LAB — MRSA PCR SCREENING: MRSA BY PCR: NEGATIVE

## 2013-07-20 LAB — PROCALCITONIN: Procalcitonin: <0.1 ng/mL

## 2013-07-20 MED ORDER — SODIUM CHLORIDE 0.9 % IV SOLN
INTRAVENOUS | Status: DC
  Administered 2013-07-22 – 2013-07-25 (×8): via INTRAVENOUS

## 2013-07-20 MED ORDER — POLYETHYLENE GLYCOL 3350 17 G PO PACK
17.0000 g | PACK | Freq: Every day | ORAL | Status: DC
  Administered 2013-07-20 – 2013-07-26 (×7): 17 g via ORAL
  Filled 2013-07-20 (×7): qty 1

## 2013-07-20 MED ORDER — HYDRALAZINE HCL 10 MG PO TABS
10.0000 mg | ORAL_TABLET | Freq: Three times a day (TID) | ORAL | Status: DC
  Administered 2013-07-20: 10 mg via ORAL
  Filled 2013-07-20 (×4): qty 1

## 2013-07-20 MED ORDER — DEXTROSE 5 % IV SOLN
1.0000 g | Freq: Once | INTRAVENOUS | Status: DC
  Administered 2013-07-20: 1 g via INTRAVENOUS
  Filled 2013-07-20: qty 10

## 2013-07-20 MED ORDER — HYDRALAZINE HCL 25 MG PO TABS
25.0000 mg | ORAL_TABLET | Freq: Three times a day (TID) | ORAL | Status: DC
  Administered 2013-07-20 – 2013-07-24 (×13): 25 mg via ORAL
  Filled 2013-07-20 (×15): qty 1

## 2013-07-20 MED ORDER — TETRAHYDROZOLINE HCL 0.05 % OP SOLN
1.0000 [drp] | Freq: Two times a day (BID) | OPHTHALMIC | Status: DC
  Administered 2013-07-20 – 2013-07-21 (×3): 1 [drp] via OPHTHALMIC
  Filled 2013-07-20 (×2): qty 15

## 2013-07-20 MED ORDER — INSULIN ASPART 100 UNIT/ML ~~LOC~~ SOLN
0.0000 [IU] | SUBCUTANEOUS | Status: DC
  Administered 2013-07-20 – 2013-07-21 (×3): 1 [IU] via SUBCUTANEOUS
  Administered 2013-07-21: 2 [IU] via SUBCUTANEOUS
  Administered 2013-07-22: 5 [IU] via SUBCUTANEOUS
  Administered 2013-07-22 – 2013-07-23 (×2): 1 [IU] via SUBCUTANEOUS
  Administered 2013-07-23: 2 [IU] via SUBCUTANEOUS
  Administered 2013-07-23: 3 [IU] via SUBCUTANEOUS
  Administered 2013-07-23: 1 [IU] via SUBCUTANEOUS
  Administered 2013-07-23 – 2013-07-24 (×2): 2 [IU] via SUBCUTANEOUS
  Administered 2013-07-24: 1 [IU] via SUBCUTANEOUS
  Administered 2013-07-24: 100 [IU] via SUBCUTANEOUS
  Administered 2013-07-25 (×2): 2 [IU] via SUBCUTANEOUS
  Administered 2013-07-25 (×2): 1 [IU] via SUBCUTANEOUS
  Administered 2013-07-26: 2 [IU] via SUBCUTANEOUS
  Administered 2013-07-26: 1 [IU] via SUBCUTANEOUS

## 2013-07-20 MED ORDER — HYDROCODONE-ACETAMINOPHEN 5-325 MG PO TABS
1.0000 | ORAL_TABLET | ORAL | Status: DC | PRN
  Administered 2013-07-22 – 2013-07-25 (×4): 1 via ORAL
  Filled 2013-07-20 (×4): qty 1

## 2013-07-20 MED ORDER — SODIUM CHLORIDE 0.9 % IJ SOLN
3.0000 mL | Freq: Two times a day (BID) | INTRAMUSCULAR | Status: DC
  Administered 2013-07-20 – 2013-07-26 (×5): 3 mL via INTRAVENOUS

## 2013-07-20 MED ORDER — DOCUSATE SODIUM 100 MG PO CAPS
100.0000 mg | ORAL_CAPSULE | Freq: Two times a day (BID) | ORAL | Status: DC
  Administered 2013-07-20 – 2013-07-26 (×13): 100 mg via ORAL
  Filled 2013-07-20 (×14): qty 1

## 2013-07-20 MED ORDER — HYDRALAZINE HCL 20 MG/ML IJ SOLN
10.0000 mg | INTRAMUSCULAR | Status: DC | PRN
  Administered 2013-07-20 – 2013-07-23 (×4): 10 mg via INTRAVENOUS
  Filled 2013-07-20 (×5): qty 1

## 2013-07-20 MED ORDER — ONDANSETRON HCL 4 MG/2ML IJ SOLN: 4.0000 mg | Freq: Four times a day (QID) | INTRAMUSCULAR | Status: DC | PRN

## 2013-07-20 MED ORDER — ACETAMINOPHEN 325 MG PO TABS: 650.0000 mg | ORAL_TABLET | Freq: Four times a day (QID) | ORAL | Status: DC | PRN

## 2013-07-20 MED ORDER — METOPROLOL TARTRATE 25 MG PO TABS
25.0000 mg | ORAL_TABLET | Freq: Two times a day (BID) | ORAL | Status: DC
  Administered 2013-07-20 – 2013-07-26 (×13): 25 mg via ORAL
  Filled 2013-07-20 (×14): qty 1

## 2013-07-20 MED ORDER — ONDANSETRON HCL 4 MG PO TABS: 4.0000 mg | ORAL_TABLET | Freq: Four times a day (QID) | ORAL | Status: DC | PRN

## 2013-07-20 MED ORDER — ATORVASTATIN CALCIUM 40 MG PO TABS
40.0000 mg | ORAL_TABLET | Freq: Every evening | ORAL | Status: DC
  Administered 2013-07-20 – 2013-07-25 (×6): 40 mg via ORAL
  Filled 2013-07-20 (×7): qty 1

## 2013-07-20 MED ORDER — AMLODIPINE BESYLATE 10 MG PO TABS
10.0000 mg | ORAL_TABLET | Freq: Every day | ORAL | Status: DC
  Administered 2013-07-20 – 2013-07-26 (×7): 10 mg via ORAL
  Filled 2013-07-20 (×7): qty 1

## 2013-07-20 MED ORDER — CLONIDINE HCL 0.1 MG PO TABS
0.1000 mg | ORAL_TABLET | Freq: Three times a day (TID) | ORAL | Status: DC
  Administered 2013-07-20 – 2013-07-26 (×19): 0.1 mg via ORAL
  Filled 2013-07-20 (×24): qty 1

## 2013-07-20 MED ORDER — DEXTROSE 5 % IV SOLN
1.0000 g | INTRAVENOUS | Status: DC
  Administered 2013-07-21 – 2013-07-25 (×5): 1 g via INTRAVENOUS
  Filled 2013-07-20 (×7): qty 10

## 2013-07-20 MED ORDER — SODIUM CHLORIDE 0.9 % IV SOLN
INTRAVENOUS | Status: DC
  Administered 2013-07-20: 06:00:00 via INTRAVENOUS

## 2013-07-20 MED ORDER — POLYETHYLENE GLYCOL 3350 17 GM/SCOOP PO POWD
17.0000 g | Freq: Every day | ORAL | Status: DC
  Filled 2013-07-20: qty 255

## 2013-07-20 MED ORDER — ACETAMINOPHEN 650 MG RE SUPP: 650.0000 mg | Freq: Four times a day (QID) | RECTAL | Status: DC | PRN

## 2013-07-20 MED ORDER — ENOXAPARIN SODIUM 30 MG/0.3ML ~~LOC~~ SOLN
30.0000 mg | SUBCUTANEOUS | Status: DC
  Administered 2013-07-20 – 2013-07-21 (×2): 30 mg via SUBCUTANEOUS
  Filled 2013-07-20 (×2): qty 0.3

## 2013-07-20 MED ORDER — PANTOPRAZOLE SODIUM 40 MG IV SOLR
40.0000 mg | Freq: Every day | INTRAVENOUS | Status: DC
  Administered 2013-07-20: 40 mg via INTRAVENOUS
  Filled 2013-07-20 (×2): qty 40

## 2013-07-20 NOTE — Progress Notes (Signed)
INITIAL NUTRITION ASSESSMENT  DOCUMENTATION CODES Per approved criteria  -Not Applicable   INTERVENTION: Diet advancement per team - recommend SLP evaluation to determine most appropriate diet texture and liquid consistency. Pt would benefit from oral nutrition supplements once diet advances recommend Glucerna Shake po BID between meals. If unable to pass swallow evaluation, recommend initiation of Glucerna 1.2 via enteral feeding tube at 20 ml/hr, advance by 10 ml q 8 hours, to goal of 50 ml/hr. Goal regimen will provide: 1440 kcal, 72 grams protein, 966 ml free water. RD to continue to follow nutrition care plan.  NUTRITION DIAGNOSIS: Inadequate oral intake related to inability to eat as evidenced by NPO status.   Goal: Diet advancement vs initiation of nutrition support. Intake to meet >90% of estimated nutrition needs.  Monitor:  weight trends, lab trends, I/O's, diet advancement vs initiation of nutrition support  Reason for Assessment: MD Consult for Assessment + MST  80 y.o. female  Admitting Dx: sepsis  ASSESSMENT: PMHx significant for DM, HTN, HLD, PNA, SDH, DVT, renal disorder. Was recently evaluated by her PCP, labs showing worsening renal function, Cre increased to 6.2 from baseline of 3 x 1year. In ER patient was noted to be hypothermic at 95 degrees rectally. Blood pressure in 200's. Per records she was supposed to start on Diovan but have not filled in prescriptions. Work-up reveals sepsis 2/2 UTI and hypothermia.  RD consulted for assessment 2/2 hypoalbuminemia. Please note that lbumin has a half-life of 21 days and is strongly affected by stress response and inflammatory process, therefore, do not expect to see an improvement in this lab value during acute hospitalization.  Pt is bundled up in bed, arms contracted to keep herself warm, has winter cap on, covering up her face. Pt unable to answer any of my questions.  Nutrition Focused Physical Exam:  Subcutaneous  Fat:  Orbital Region: WNL Upper Arm Region: n/a Thoracic and Lumbar Region: n/a  Muscle:  Temple Region: WNL Clavicle Bone Region: moderate depletion Clavicle and Acromion Bone Region: moderate depletion Scapular Bone Region: n/a Dorsal Hand: n/a Patellar Region: n/a Anterior Thigh Region: WNL Posterior Calf Region: n/a  Edema: none  Discussed reason for NPO status with RN, Gearldine Bienenstock. Pt likely with dementia and delirium, she suspects that pt will remain NPO until labs improve and mental status evaluated. Discussed potential need for SLP evaluation.   Height: Ht Readings from Last 1 Encounters:  07/16/13 5\' 4"  (1.626 m)    Weight: Wt Readings from Last 1 Encounters:  07/16/13 125 lb (56.7 kg)    Ideal Body Weight: 120 lb  % Ideal Body Weight: 104%  Wt Readings from Last 10 Encounters:  07/16/13 125 lb (56.7 kg)  01/11/13 122 lb 8 oz (55.566 kg)  12/12/12 106 lb 7.7 oz (48.3 kg)  12/12/12 106 lb 7.7 oz (48.3 kg)  12/06/12 116 lb 6.4 oz (52.799 kg)  11/26/12 126 lb 6.4 oz (57.335 kg)  08/15/12 141 lb 9.6 oz (64.229 kg)  08/08/12 146 lb 9.7 oz (66.5 kg)  08/01/12 154 lb 15.7 oz (70.3 kg)  08/08/12 146 lb 9.7 oz (66.5 kg)    Usual Body Weight: variable, ranging from 115 - 125 lb  % Usual Body Weight: 100%  BMI:  21.4 - WNL  Estimated Nutritional Needs: Kcal: 1300 - 1500 Protein: 55 - 70 g Fluid: at least 1.5 liters daily  Skin: intact  Diet Order: NPO  EDUCATION NEEDS: -No education needs identified at this time   Intake/Output  Summary (Last 24 hours) at 07/20/13 0810 Last data filed at 07/20/13 0437  Gross per 24 hour  Intake     10 ml  Output    131 ml  Net   -121 ml    Last BM: PTA  Labs:   Recent Labs Lab 07/16/13 1119 07/19/13 1908  NA 140 147  K 4.7 3.8  CL 105 108  CO2 21 23  BUN 67* 68*  CREATININE 6.10* 6.24*  CALCIUM 9.3 9.0  GLUCOSE 202* 144*    CBG (last 3)   Recent Labs  07/20/13 0515 07/20/13 0714  GLUCAP 122*  96    Scheduled Meds: . amLODipine  10 mg Oral Daily  . atorvastatin  40 mg Oral QPM  . [START ON 07/21/2013] cefTRIAXone (ROCEPHIN)  IV  1 g Intravenous Q24H  . cloNIDine  0.1 mg Oral TID  . docusate sodium  100 mg Oral BID  . enoxaparin (LOVENOX) injection  30 mg Subcutaneous Q24H  . hydrALAZINE  10 mg Oral 3 times per day  . insulin aspart  0-9 Units Subcutaneous 6 times per day  . metoprolol tartrate  25 mg Oral BID  . pantoprazole (PROTONIX) IV  40 mg Intravenous QHS  . polyethylene glycol  17 g Oral Daily  . sodium chloride  3 mL Intravenous Q12H  . tetrahydrozoline  1 drop Both Eyes BID    Continuous Infusions: . sodium chloride 75 mL/hr at 07/20/13 [id number]    Past Medical History  Diagnosis Date  . Diabetes mellitus   . Hypertension   . Hyperlipidemia   . Pneumonia   . Subdural hematoma   . Arthritis   . DVT (deep venous thrombosis)   . Incontinence   . Renal disorder     chronic kidney dz stage III    Past Surgical History  Procedure Laterality Date  . Insertion of vena cava filter    . Craniotomy Right 07/23/2012    Procedure: CRANIOTOMY HEMATOMA EVACUATION SUBDURAL;  Surgeon: Hewitt Shortsobert W Nudelman, MD;  Location: [facility];  Service: Neurosurgery;  Laterality: Right;  . Craniotomy N/A 08/06/2012    Procedure: CRANIOTOMY HEMATOMA EVACUATION SUBDURAL;  Surgeon: Hewitt Shortsobert W Nudelman, MD;  Location: [facility];  Service: Neurosurgery;  Laterality: N/A;  Craniectomy for evacuation of subdural hematoma, implantation of bone flap in abdominal wall   . Brain surgery    . Craniotomy N/A 12/12/2012    Procedure: CRANIOTOMY BONE FLAP/PROSTHETIC PLATE retrieval of bone from abdomen;  Surgeon: Hewitt Shortsobert W Nudelman, MD;  Location: [facility];  Service: Neurosurgery;  Laterality: N/A;  cranioplasty with retrieval of bone flap from abdominal wall pocket. Site is head and abdomen.    Jarold MottoSamantha Sedona Wenk MS, RD, LDN Inpatient Registered Dietitian Pager: [phone] After-hours pager:  [phone]

## 2013-07-20 NOTE — Progress Notes (Signed)
Patient admitted by Dr. Adela Glimpseoutova.  Will continue to gently hydrate and recheck Cr in AM- call renal in AM if not better BP improved to 150 systolically .  ? Baseline- patient not oriented to place/time No family available- will attempt to contact by phone  Marlin CanaryJessica Johsua Shevlin DO

## 2013-07-20 NOTE — ED Notes (Signed)
Dr. Doutova at BS. 

## 2013-07-20 NOTE — H&P (Signed)
PCP: Jeanann Lewandowsky, MD    Chief Complaint:  Send because of labs  HPI: April [name] is a 80 y.o. female   has a past medical history of Diabetes mellitus; Hypertension; Hyperlipidemia; Pneumonia; Subdural hematoma; Arthritis; DVT (deep venous thrombosis); Incontinence; and Renal disorder.   Presented with  Patient was recently evaluated by her PCP and had some labs taken showing worsening renal function with creating going up to 6.2 from baseline of 3 a year ago. No family at bedside patient is unaware why she is here. Denies any pain, denies poor appetite, no nausea or vomiting. In ER patient was noted to be hypothermic down to 95 degrees rectally. Blood pressure in 200's. Per records she was supposed to start on Diovan but have not filled in prescriptions.  Hospitalist called for admission.  Review of Systems:  Unable to obtain  Past Medical History: Past Medical History  Diagnosis Date  . Diabetes mellitus   . Hypertension   . Hyperlipidemia   . Pneumonia   . Subdural hematoma   . Arthritis   . DVT (deep venous thrombosis)   . Incontinence   . Renal disorder     chronic kidney dz stage III   Past Surgical History  Procedure Laterality Date  . Insertion of vena cava filter    . Craniotomy Right 07/23/2012    Procedure: CRANIOTOMY HEMATOMA EVACUATION SUBDURAL;  Surgeon: [name], MD;  Location: [facility];  Service: Neurosurgery;  Laterality: Right;  . Craniotomy N/A 08/06/2012    Procedure: CRANIOTOMY HEMATOMA EVACUATION SUBDURAL;  Surgeon: [name], MD;  Location: [facility];  Service: Neurosurgery;  Laterality: N/A;  Craniectomy for evacuation of subdural hematoma, implantation of bone flap in abdominal wall   . Brain surgery    . Craniotomy N/A 12/12/2012    Procedure: CRANIOTOMY BONE FLAP/PROSTHETIC PLATE retrieval of bone from abdomen;  Surgeon: [name], MD;  Location: [facility];  Service: Neurosurgery;  Laterality: N/A;  cranioplasty  with retrieval of bone flap from abdominal wall pocket. Site is head and abdomen.     Medications: Prior to Admission medications   Medication Sig Start Date End Date Taking? Authorizing Provider  amLODipine (NORVASC) 10 MG tablet Take 1 tablet (10 mg total) by mouth daily. 07/16/13  Yes Richarda Overlie, MD  atorvastatin (LIPITOR) 40 MG tablet Take 1 tablet (40 mg total) by mouth every evening. 07/16/13  Yes Richarda Overlie, MD  cloNIDine (CATAPRES) 0.2 MG tablet Take 0.5 tablets (0.1 mg total) by mouth 3 (three) times daily. 07/16/13  Yes Richarda Overlie, MD  fish oil-omega-3 fatty acids 1000 MG capsule Take 2 capsules (2 g total) by mouth daily. 04/12/13  Yes Alison Murray, MD  hydrALAZINE (APRESOLINE) 10 MG tablet Take 1 tablet (10 mg total) by mouth every 8 (eight) hours. 07/16/13  Yes Richarda Overlie, MD  metoprolol tartrate (LOPRESSOR) 25 MG tablet Take 1 tablet (25 mg total) by mouth 2 (two) times daily. 07/16/13  Yes Richarda Overlie, MD  Multiple Vitamin (MULTIVITAMIN WITH MINERALS) TABS tablet Take 1 tablet by mouth daily. 04/12/13  Yes Alison Murray, MD  polyethylene glycol powder [facility]) powder Take 17 g by mouth daily. 07/16/13  Yes Richarda Overlie, MD  pravastatin (PRAVACHOL) 20 MG tablet Take 0.5 tablets (10 mg total) by mouth daily. 07/16/13  Yes Richarda Overlie, MD  Tetrahydrozoline HCl (EYE DROPS OP) Place 1 drop into both eyes daily as needed (dry eyes).   Yes Historical Provider,  MD    Allergies:  No Known Allergies  Social History:  Ambulatory  independently   Lives at   Home with family   reports that she has never smoked. She has never used smokeless tobacco. She reports that she does not drink alcohol or use illicit drugs.   Family History: family history includes Diabetes Mellitus II in her son.    Physical Exam: Patient Vitals for the past 24 hrs:  BP Temp Temp src Pulse Resp SpO2  07/19/13 2345 199/71 mmHg - - 71 - 100 %  07/19/13 2300 184/73 mmHg - - 58 - 100 %  07/19/13 2215  198/74 mmHg - - 60 - 100 %  07/19/13 2200 215/77 mmHg - - 62 - 100 %  07/19/13 2145 201/66 mmHg - - 57 - 100 %  07/19/13 2134 187/88 mmHg - - 60 18 100 %  07/19/13 1845 195/112 mmHg 97.4 F (36.3 C) Oral 82 16 100 %    1. General:  in No Acute distress, shivering 2. Psychological: Alert but not Oriented 3. Head/ENT:     Dry Mucous Membranes                          Head Non traumatic, neck supple                          Normal   Dentition 4. SKIN:   decreased Skin turgor,  Skin clean Dry and intact no rash 5. Heart: Regular rate and rhythm no Murmur, Rub or gallop 6. Lungs: Clear to auscultation bilaterally, no wheezes or crackles   7. Abdomen: Soft, non-tender, Non distended 8. Lower extremities: no clubbing, cyanosis, or edema 9. Neurologically Grossly intact, moving all 4 extremities equally 10. MSK: Normal range of motion  body mass index is unknown because there is no weight on file.   Labs on Admission:   Recent Labs  07/19/13 1908  NA 147  K 3.8  CL 108  CO2 23  GLUCOSE 144*  BUN 68*  CREATININE 6.24*  CALCIUM 9.0    Recent Labs  07/19/13 1908  AST 26  ALT 23  ALKPHOS 80  BILITOT <0.2*  PROT 6.3  ALBUMIN 2.5*   No results found for this basename: LIPASE, AMYLASE,  in the last 72 hours  Recent Labs  07/19/13 1908  WBC 4.3  NEUTROABS 2.9  HGB 8.0*  HCT 23.7*  MCV 82.3  PLT 362   No results found for this basename: CKTOTAL, CKMB, CKMBINDEX, TROPONINI,  in the last 72 hours No results found for this basename: TSH, T4TOTAL, FREET3, T3FREE, THYROIDAB,  in the last 72 hours No results found for this basename: VITAMINB12, FOLATE, FERRITIN, TIBC, IRON, RETICCTPCT,  in the last 72 hours Lab Results  Component Value Date   HGBA1C 6.6 07/16/2013    The CrCl is unknown because both a height and weight (above a minimum accepted value) are required for this calculation. ABG    Component Value Date/Time   PHART 7.337* 08/06/2012 2100   HCO3 22.3 08/06/2012  2100   TCO2 23.6 08/06/2012 2100   ACIDBASEDEF 2.6* 08/06/2012 2100   O2SAT 99.3 08/06/2012 2100     No results found for this basename: DDIMER     Other results:  I have pearsonaly reviewed this: ECG REPORT     UA evidence of UTI    Cultures:    Component Value Date/Time  SDES URINE, CATHETERIZED 12/13/2012 1918   SPECREQUEST NONE 12/13/2012 1918   CULT NO GROWTH 12/13/2012 1918   REPTSTATUS 12/14/2012 FINAL 12/13/2012 1918       Radiological Exams on Admission: No results found.  Chart has been reviewed  Assessment/Plan  80 yo f with hx of CKD here with worsening renal unction and evidence of UTI  Present on Admission:  . Sepsis - given hypothermia and UTI presumed sepsis, cover with IV antibiotics, admit to stepdown, lactic acid, procalcitonin, blood cultures . Diabetes mellitus - SSI . CKD (chronic kidney disease) stage 3, GFR 30-59 ml/min - baseline Cr around 3, unsure if current worsening due to slow worsening or rapid failure . Acute on chronic renal failure - Obtain urine electrolytes, renal US no evidence of fluid overload, gentle IVF would get Renal consult in AM.  . UTI (lower urinary tract infection) - on rocephin . Anemia - anemia panel, hemoccult stool  . Hypoalbuminemia - check prealbumin, nutrition consult . Hypothermia presumed to be due to sepsis, will check TSH as well   Prophylaxis:   Lovenox, Protonix  CODE STATUS: Full code presumed until able to contact family  Other plan as per orders.  I have spent a total of 55 min on this admission  Jahmar Mckelvy 07/20/2013, 1:38 AM

## 2013-07-20 NOTE — ED Notes (Signed)
Pt to CT, VSS, BP elevated/increasing, pending arrival of IV team, unsuccessful with IV x3 sticks.

## 2013-07-20 NOTE — ED Notes (Signed)
Pt given warm blankets to attempt to get her temperature elevated. Doutova, MD is aware.

## 2013-07-21 DIAGNOSIS — N390 Urinary tract infection, site not specified: Secondary | ICD-10-CM

## 2013-07-21 DIAGNOSIS — I10 Essential (primary) hypertension: Secondary | ICD-10-CM

## 2013-07-21 LAB — GLUCOSE, CAPILLARY
GLUCOSE-CAPILLARY: 105 mg/dL — AB (ref 70–99)
GLUCOSE-CAPILLARY: 110 mg/dL — AB (ref 70–99)
GLUCOSE-CAPILLARY: 124 mg/dL — AB (ref 70–99)
GLUCOSE-CAPILLARY: 152 mg/dL — AB (ref 70–99)
Glucose-Capillary: 148 mg/dL — ABNORMAL HIGH (ref 70–99)
Glucose-Capillary: 94 mg/dL (ref 70–99)

## 2013-07-21 LAB — CREATININE, URINE, RANDOM: CREATININE, URINE: 93.65 mg/dL

## 2013-07-21 LAB — BASIC METABOLIC PANEL
BUN: 67 mg/dL — ABNORMAL HIGH (ref 6–23)
CO2: 22 mEq/L (ref 19–32)
CREATININE: 5.96 mg/dL — AB (ref 0.50–1.10)
Calcium: 8.6 mg/dL (ref 8.4–10.5)
Chloride: 110 mEq/L (ref 96–112)
GFR, EST AFRICAN AMERICAN: 6 mL/min — AB (ref >90)
GFR, EST NON AFRICAN AMERICAN: 6 mL/min — AB (ref >90)
Glucose, Bld: 100 mg/dL — ABNORMAL HIGH (ref 70–99)
Potassium: 4.2 mEq/L (ref 3.7–5.3)
SODIUM: 146 meq/L (ref 137–147)

## 2013-07-21 LAB — URINE CULTURE
Colony Count: >=100000
SPECIAL REQUESTS: NORMAL

## 2013-07-21 LAB — CBC
HCT: 24.2 % — ABNORMAL LOW (ref 36.0–46.0)
Hemoglobin: 8 g/dL — ABNORMAL LOW (ref 12.0–15.0)
MCH: 27.6 pg (ref 26.0–34.0)
MCHC: 33.1 g/dL (ref 30.0–36.0)
MCV: 83.4 fL (ref 78.0–100.0)
PLATELETS: 351 10*3/uL (ref 150–400)
RBC: 2.9 MIL/uL — ABNORMAL LOW (ref 3.87–5.11)
RDW: 16.2 % — ABNORMAL HIGH (ref 11.5–15.5)
WBC: 4.4 10*3/uL (ref 4.0–10.5)

## 2013-07-21 LAB — SODIUM, URINE, RANDOM: Sodium, Ur: 65 mEq/L

## 2013-07-21 MED ORDER — NAPHAZOLINE HCL 0.1 % OP SOLN
1.0000 [drp] | Freq: Two times a day (BID) | OPHTHALMIC | Status: DC
  Filled 2013-07-21: qty 15

## 2013-07-21 MED ORDER — PANTOPRAZOLE SODIUM 40 MG PO TBEC
40.0000 mg | DELAYED_RELEASE_TABLET | Freq: Every day | ORAL | Status: DC
  Administered 2013-07-21 – 2013-07-26 (×6): 40 mg via ORAL
  Filled 2013-07-21 (×5): qty 1

## 2013-07-21 MED ORDER — NAPHAZOLINE-PHENIRAMINE 0.025-0.3 % OP SOLN
1.0000 [drp] | Freq: Two times a day (BID) | OPHTHALMIC | Status: DC
  Administered 2013-07-21 – 2013-07-26 (×10): 1 [drp] via OPHTHALMIC
  Filled 2013-07-21: qty 15

## 2013-07-21 NOTE — Progress Notes (Addendum)
[facility] PROGRESS NOTE  April [name] WUJ:[id number] DOB: Mar 06, 1922 DOA: 07/19/2013 PCP: Jeanann Lewandowsky, MD  Assessment/Plan: Sepsis - given hypothermia and UTI presumed sepsis, cover with IV antibiotics,  -resolved -transfer out of step down  Diabetes mellitus - SSI  HTN- titrate medications for tighter control- will proceed slowly  Acute on chronic renal failure -  Baseline appears to be 3 -renal U/S shows: Increased renal cortical echogenicity as can be seen with chronic  medical renal disease.  Echogenic material within the bladder lumen which does not move with repositioning of the patient. No definite internal vascular  blood flow is demonstrated. This may represent thick debris versus an underlying mass. Recommend correlation with urinalysis and direct visualization as clinically indicated to exclude underlying mass. Hypoechoic lesion within the left kidney measuring up to 1.3 cm. This does not meet definite criteria for a simple cyst as there is internal echogenicity. No definite vascular flow is identified. Recommend correlation with either short-term (3 months) followup ultrasound or definitive characterization with CT or MRI. -renal consult -would not think patient is a candidate for dialysis -foley placed for strict I/Os  Dementia- ?baseline -called son, straight to voicemail  UTI (lower urinary tract infection)- gram negative rods - on rocephin  Anemia - anemia panel, hemoccult stool   Hypoalbuminemia - check prealbumin, nutrition consult   Hypothermia presumed to be due to sepsis, TSH   Code Status: full- still trying to clarify with family Family Communication: no family and would not answer phone Disposition Plan: tx to med surge   Consultants:  renal  Procedures:    Antibiotics:  Rocephin 2/14  HPI/Subjective: C/o saliva in mouth  Objective: Filed Vitals:   07/21/13 0738  BP:   Pulse:   Temp: 97.6 F (36.4 C)  Resp:      Intake/Output Summary (Last 24 hours) at 07/21/13 0808 Last data filed at 07/21/13 0500  Gross per 24 hour  Intake   1050 ml  Output    720 ml  Net    330 ml   Filed Weights   07/21/13 0400  Weight: 48.9 kg (107 lb 12.9 oz)    Exam:   General:   Elderly appearing  Cardiovascular: rrr  Respiratory: clear anterior  Abdomen: +BS, soft  Musculoskeletal: no LE edema  Data Reviewed: Basic Metabolic Panel:  Recent Labs Lab 07/16/13 1119 07/19/13 1908 07/20/13 0730 07/21/13 0244  NA 140 147 146 146  K 4.7 3.8 4.0 4.2  CL 105 108 110 110  CO2 21 23 20 22   GLUCOSE 202* 144* 94 100*  BUN 67* 68* 71* 67*  CREATININE 6.10* 6.24* 6.03* 5.96*  CALCIUM 9.3 9.0 8.8 8.6  MG  --   --  2.0  --   PHOS  --   --  4.2  --    Liver Function Tests:  Recent Labs Lab 07/16/13 1119 07/19/13 1908 07/20/13 0730  AST 30 26 22   ALT 22 23 20   ALKPHOS 88 80 71  BILITOT 0.3 <0.2* <0.2*  PROT 6.3 6.3 5.5*  ALBUMIN 3.0* 2.5* 2.1*   No results found for this basename: LIPASE, AMYLASE,  in the last 168 hours No results found for this basename: AMMONIA,  in the last 168 hours CBC:  Recent Labs Lab 07/16/13 1119 07/19/13 1908 07/20/13 0730 07/21/13 0244  WBC 5.6 4.3 4.8 4.4  NEUTROABS 3.4 2.9  --   --   HGB 9.4* 8.0* 7.3* 8.0*  HCT 28.4* 23.7* 21.3* 24.2*  MCV 83.0 82.3 82.2 83.4  PLT 345 362 311 351   Cardiac Enzymes:  Recent Labs Lab 07/20/13 0736 07/20/13 1118 07/20/13 1800  TROPONINI <0.30 <0.30 <0.30   BNP (last 3 results) No results found for this basename: PROBNP,  in the last 8760 hours CBG:  Recent Labs Lab 07/20/13 1513 07/20/13 1955 07/20/13 2354 07/21/13 0349 07/21/13 0720  GLUCAP 79 86 94 105* 110*    Recent Results (from the past 240 hour(s))  URINE CULTURE     Status: None   Collection Time    07/19/13 11:44 PM      Result Value Ref Range Status   Specimen Description URINE, CATHETERIZED   Final   Special Requests Normal   Final    Culture  Setup Time     Final   Value: 07/20/2013 12:42     Performed at [facility] Count     Final   Value: >=100,000 COLONIES/ML     Performed at [facility]   Culture     Final   Value: GRAM NEGATIVE RODS     Performed at [facility]   Report Status PENDING   Incomplete  MRSA PCR SCREENING     Status: None   Collection Time    07/20/13  5:07 AM      Result Value Ref Range Status   MRSA by PCR NEGATIVE  NEGATIVE Final   Comment:            The GeneXpert MRSA Assay (FDA     approved for NASAL specimens     only), is one component of a     comprehensive MRSA colonization     surveillance program. It is not     intended to diagnose MRSA     infection nor to guide or     monitor treatment for     MRSA infections.     Studies: Ct Head Wo Contrast  07/20/2013   CLINICAL DATA:  Evaluate for intracranial hemorrhage  EXAM: CT HEAD WITHOUT CONTRAST  TECHNIQUE: Contiguous axial images were obtained from the base of the skull through the vertex without intravenous contrast.  COMPARISON:  Prior CT from 02/12/2013  FINDINGS: Sequelae of prior right frontoparietal craniotomy with cranioplasty again seen. Linear hyperdensity subjacent to the craniotomy defect likely reflects sequelae of cranioplasty/dural repair. Previously seen fluid within the subdural space has resolved.  Extensive atrophy with chronic microvascular ischemic changes again seen, unchanged. No mass lesion, midline shift, or hydrocephalus. No extra-axial fluid collection. No acute intracranial hemorrhage or large vessel territory infarct. More focal encephalomalacia within the right frontal lobe likely represents a remote infarct.  No skull fracture. Orbits are within normal limits. Scattered opacity noted within the right ethmoidal air cells. No mastoid effusion.  IMPRESSION: 1. No acute intracranial process. 2. Postoperative changes from right frontoparietal craniotomy with cranioplasty.  Previously seen subdural fluid subjacent to the craniotomy site has resolved. 3. Advanced age-related atrophy with chronic microvascular ischemic disease, unchanged.   Electronically Signed   By: Rise Mu M.D.   On: 07/20/2013 04:17   US Renal  07/20/2013   CLINICAL DATA:  Renal failure  EXAM: RENAL/URINARY TRACT ULTRASOUND COMPLETE  COMPARISON:  None.  FINDINGS: Right Kidney:  Length: 9.5 cm. Normal renal cortical thickness. Mild increased parenchymal echogenicity. No hydronephrosis. There is a sub centimeter hypoechoic lesion within the superior pole of the right kidney, too small accurately characterize.  Left Kidney:  Length:  9.6 cm. Normal renal cortical thickness. Increased renal parenchymal echogenicity. No hydronephrosis. There is a 1.3 cm hypoechoic lesion within the inferior pole of the left kidney. Small amount of fluid about the left kidney.  Bladder:  There is nonmobile echogenic material demonstrated within the bladder. No definite internal vascularity is identified.  IMPRESSION: 1. Increased renal cortical echogenicity as can be seen with chronic medical renal disease. 2. Echogenic material within the bladder lumen which does not move with repositioning of the patient. No definite internal vascular blood flow is demonstrated. This may represent thick debris versus an underlying mass. Recommend correlation with urinalysis and direct visualization as clinically indicated to exclude underlying mass. 3. Small amount of fluid about the left kidney. 4. Hypoechoic lesion within the left kidney measuring up to 1.3 cm. This does not meet definite criteria for a simple cyst as there is internal echogenicity. No definite vascular flow is identified. Recommend correlation with either short-term (3 months) followup ultrasound or definitive characterization with CT or MRI. These results will be called to the ordering clinician or representative by the Radiologist Assistant, and communication  documented in the PACS Dashboard.   Electronically Signed   By: Annia Beltrew  Davis M.D.   On: 07/20/2013 12:23    Scheduled Meds: . amLODipine  10 mg Oral Daily  . atorvastatin  40 mg Oral QPM  . cefTRIAXone (ROCEPHIN)  IV  1 g Intravenous Q24H  . cloNIDine  0.1 mg Oral TID  . docusate sodium  100 mg Oral BID  . enoxaparin (LOVENOX) injection  30 mg Subcutaneous Q24H  . hydrALAZINE  25 mg Oral 3 times per day  . insulin aspart  0-9 Units Subcutaneous 6 times per day  . metoprolol tartrate  25 mg Oral BID  . pantoprazole (PROTONIX) IV  40 mg Intravenous QHS  . polyethylene glycol  17 g Oral Daily  . sodium chloride  3 mL Intravenous Q12H  . tetrahydrozoline  1 drop Both Eyes BID   Continuous Infusions: . sodium chloride 50 mL/hr at 07/20/13 1245    Active Problems:   Diabetes mellitus   CKD (chronic kidney disease) stage 3, GFR 30-59 ml/min   Acute on chronic renal failure   UTI (lower urinary tract infection)   Anemia   Hypoalbuminemia   Sepsis   Hypothermia    Time spent: 35 min    Calina Patrie  [facility] Pager [phone] If 7PM-7AM, please contact night-coverage at [email or web address], password [email or web address] 07/21/2013, 8:08 AM  LOS: 2 days

## 2013-07-21 NOTE — Consult Note (Signed)
Renal Service Consult Note [facility]  Corda Shutt 07/21/2013 Roney Jaffe D Requesting Physician:  Dr Eliseo Squires  Reason for Consult:  Acute on CKD HPI: The patient is a 80 y.o. year-old with hx of DM, HTN, HL, CKD 3 and SDH w craniotomy Feb '14.  Patient was recently in PCP's office and labs were drawn which came back with creat of 6 while a year ago it was apparently 3. She was sent to ED where BP was in 200's, temp 95deg F, pt was confused and not sure why she was there. There were signs of UTI and pt was admitted by hospitalist service on 2/14 with dx of sepsis, acute on CKD, DM, UTI, hypoalbuminemia. IV abx were given with Rocephin.  BP meds were ordered with MTP, clonidine and po hydralazine. NS was given at 75 cc/hr. Today creat is down slightly to 5.92 from 6.25 yesterday.  The last creatinines in Aug 2014 were 2.8-3.1 range.  In early 2014 creat was 1.9-2.2.  UOP yest was 720 cc.       Patient is in bed, poor historian, alert, vague history.  She denies any sob, cp, n/v/d, abd pain or voiding problems.    Past Medical History  Past Medical History  Diagnosis Date  . Diabetes mellitus   . Hypertension   . Hyperlipidemia   . Pneumonia   . Subdural hematoma   . Arthritis   . DVT (deep venous thrombosis)   . Incontinence   . Renal disorder     chronic kidney dz stage III   Past Surgical History  Past Surgical History  Procedure Laterality Date  . Insertion of vena cava filter    . Craniotomy Right 07/23/2012    Procedure: CRANIOTOMY HEMATOMA EVACUATION SUBDURAL;  Surgeon: Hosie Spangle, MD;  Location: [place] [facility];  Service: Neurosurgery;  Laterality: Right;  . Craniotomy N/A 08/06/2012    Procedure: CRANIOTOMY HEMATOMA EVACUATION SUBDURAL;  Surgeon: Hosie Spangle, MD;  Location: [place] [facility];  Service: Neurosurgery;  Laterality: N/A;  Craniectomy for evacuation of subdural hematoma, implantation of bone flap in abdominal wall   . Brain surgery    .  Craniotomy N/A 12/12/2012    Procedure: CRANIOTOMY BONE FLAP/PROSTHETIC PLATE retrieval of bone from abdomen;  Surgeon: Hosie Spangle, MD;  Location: [place] [facility];  Service: Neurosurgery;  Laterality: N/A;  cranioplasty with retrieval of bone flap from abdominal wall pocket. Site is head and abdomen.   Family History  Family History  Problem Relation Age of Onset  . Diabetes Mellitus II Son    Social History  reports that she has never smoked. She has never used smokeless tobacco. She reports that she does not drink alcohol or use illicit drugs. Allergies No Known Allergies Home medications Prior to Admission medications   Medication Sig Start Date End Date Taking? Authorizing Provider  amLODipine (NORVASC) 10 MG tablet Take 1 tablet (10 mg total) by mouth daily. 07/16/13  Yes Reyne Dumas, MD  atorvastatin (LIPITOR) 40 MG tablet Take 1 tablet (40 mg total) by mouth every evening. 07/16/13  Yes Reyne Dumas, MD  cloNIDine (CATAPRES) 0.2 MG tablet Take 0.5 tablets (0.1 mg total) by mouth 3 (three) times daily. 07/16/13  Yes Reyne Dumas, MD  fish oil-omega-3 fatty acids 1000 MG capsule Take 2 capsules (2 g total) by mouth daily. 04/12/13  Yes Robbie Lis, MD  hydrALAZINE (APRESOLINE) 10 MG tablet Take 1 tablet (10 mg total) by mouth every 8 (eight)  hours. 07/16/13  Yes Reyne Dumas, MD  metoprolol tartrate (LOPRESSOR) 25 MG tablet Take 1 tablet (25 mg total) by mouth 2 (two) times daily. 07/16/13  Yes Reyne Dumas, MD  Multiple Vitamin (MULTIVITAMIN WITH MINERALS) TABS tablet Take 1 tablet by mouth daily. 04/12/13  Yes Robbie Lis, MD  polyethylene glycol powder [facility]) powder Take 17 g by mouth daily. 07/16/13  Yes Reyne Dumas, MD  pravastatin (PRAVACHOL) 20 MG tablet Take 0.5 tablets (10 mg total) by mouth daily. 07/16/13  Yes Reyne Dumas, MD  Tetrahydrozoline HCl (EYE DROPS OP) Place 1 drop into both eyes daily as needed (dry eyes).   Yes Historical Provider, MD   Liver Function  Tests  Recent Labs Lab 07/16/13 1119 07/19/13 1908 07/20/13 0730  AST _0 ALT _1 ALKPHOS 88 80 71  BILITOT 0.3 <0.2* <0.2*  PROT 6.3 6.3 5.5*  ALBUMIN 3.0* 2.5* 2.1*   No results found for this basename: LIPASE, AMYLASE,  in the last 168 hours CBC  Recent Labs Lab 07/16/13 1119 07/19/13 1908 07/20/13 0730 07/21/13 0244  WBC 5.6 4.3 4.8 4.4  NEUTROABS 3.4 2.9  --   --   HGB 9.4* 8.0* 7.3* 8.0*  HCT 28.4* 23.7* 21.3* 24.2*  MCV 83.0 82.3 82.2 83.4  PLT 345 362 311 580   Basic Metabolic Panel  Recent Labs Lab 07/16/13 1119 07/19/13 1908 07/20/13 0730 07/21/13 0244  NA 140 147 146 146  K 4.7 3.8 4.0 4.2  CL 105 108 110 110  CO2 _2 GLUCOSE 202* 144* 94 100*  BUN 67* 68* 71* 67*  CREATININE 6.10* 6.24* 6.03* 5.96*  CALCIUM 9.3 9.0 8.8 8.6  PHOS  --   --  4.2  --     Exam  Blood pressure 173/62, pulse 62, temperature 97 F (36.1 C), temperature source Oral, resp. rate 21, weight 48.9 kg (107 lb 12.9 oz), SpO2 100.00%. Elderly AAF, awake, slow to respond but pleasant No rash, cyanosis or gangrene Sclera anicteric, throat clear and moist JVD 12cm Chest is clear bilat RRR no MRG Abd soft, nt,nd, no ascites, mass or HSM GU foley in place with 500 cc clear urine No LE or UE edema, +contractures in UE's and LE's Neuro is gen weakness, nonfocal, resting tremor bilat UE's, oriented to person only "[place]", no asterixis  UA (2/13) > >300 prot, 11-20wbc, 0-2rbc, mod LE, rare bact CXR not done Renal US today > inc'd renal cortical echo signal c/w medical renal diseae, 9.5/9.6 cm, no hydro  Assessment: 1 Acute on CRF- baseline renal function from last admit with creat 2.8-3.1  (eGFR of 12-15 ml/min) was stage IVb which is advanced CKD.  Now creat is up to 6 in setting of presumed UTI. Making urine with IVF's.  BP's high. 2 UTI 3 DM on SSI 4 HTN- BP's high on amlodipine, hydralazine, MTP 5 Dementia 6 Hx SDH w evacuation Feb 2014   Plan-  agree with IVF"s, BP meds , don't overtreat BP - keep sbp over 120 range. Prognosis poor in the long run. Not a candidate for dialysis due to comorbidities.  Would consider [facility] for goals of care. Will sign off, please call as needed.    Kelly Splinter MD (pgr) [phone]    [phone] 07/21/2013, 2:05 PM

## 2013-07-21 NOTE — Progress Notes (Signed)
Pt taken to [facility]. Report given to Harley-Davidsonlbert RN.  No c/o pain. Vital signs WNL before transfer.  Pt alert and oriented x1, no change from baseline.

## 2013-07-21 NOTE — Progress Notes (Addendum)
Dr. Arlean HoppingSchertz at bedside aware of difficulty obtaining PIV with 3 attempts.  States is a candidate for PICC placement due to not a candidate for dialysis with Cr Cl of 7.2.

## 2013-07-22 DIAGNOSIS — G9340 Encephalopathy, unspecified: Secondary | ICD-10-CM

## 2013-07-22 LAB — GLUCOSE, CAPILLARY
GLUCOSE-CAPILLARY: 209 mg/dL — AB (ref 70–99)
GLUCOSE-CAPILLARY: 259 mg/dL — AB (ref 70–99)
Glucose-Capillary: 100 mg/dL — ABNORMAL HIGH (ref 70–99)
Glucose-Capillary: 120 mg/dL — ABNORMAL HIGH (ref 70–99)
Glucose-Capillary: 126 mg/dL — ABNORMAL HIGH (ref 70–99)
Glucose-Capillary: 266 mg/dL — ABNORMAL HIGH (ref 70–99)
Glucose-Capillary: 99 mg/dL (ref 70–99)

## 2013-07-22 MED ORDER — ENSURE COMPLETE PO LIQD
237.0000 mL | Freq: Two times a day (BID) | ORAL | Status: DC
  Administered 2013-07-22 – 2013-07-26 (×8): 237 mL via ORAL

## 2013-07-22 NOTE — Progress Notes (Signed)
NUTRITION FOLLOW-UP  DOCUMENTATION CODES Per approved criteria  -Not Applicable   INTERVENTION: Downgrade diet to Dysphagia 2. Recommend removal of renal restrictions to maximize PO choices. Add Ensure Complete po BID, each supplement provides 350 kcal and 13 grams of protein. RD to continue to follow nutrition care plan.  NUTRITION DIAGNOSIS: Inadequate oral intake related to inability to eat as evidenced by NPO status.   Goal: Diet advancement vs initiation of nutrition support. Intake to meet >90% of estimated nutrition needs.  Monitor:  weight trends, lab trends, I/O's, diet advancement vs initiation of nutrition support  ASSESSMENT: PMHx significant for DM, HTN, HLD, PNA, SDH, DVT, renal disorder. Was recently evaluated by her PCP, labs showing worsening renal function, Cre increased to 6.2 from baseline of 3 x 1year. In ER patient was noted to be hypothermic at 95 degrees rectally. Blood pressure in 200's. Per records she was supposed to start on Diovan but have not filled in prescriptions. Work-up reveals sepsis 2/2 UTI and hypothermia.  Renal evaluated pt - noted have stage IV CKD, not HD candidate 2/2 comorbidities. Per MD note, plan is for son to be here today, MD to address GOC with him.  Started on Renal diet on 2/15 - eating 50%.   Height: Ht Readings from Last 1 Encounters:  07/21/13 5\' 4"  (1.626 m)    Weight: Wt Readings from Last 1 Encounters:  07/21/13 110 lb (49.896 kg)  Admit wt 107 lb  BMI:  21.4 - WNL  Estimated Nutritional Needs: Kcal: 1300 - 1500 Protein: 55 - 70 g Fluid: at least 1.5 liters daily  Skin: intact  Diet Order: Renal  EDUCATION NEEDS: -No education needs identified at this time   Intake/Output Summary (Last 24 hours) at 07/22/13 1128 Last data filed at 07/22/13 0900  Gross per 24 hour  Intake    243 ml  Output    300 ml  Net    -57 ml    Last BM: PTA  Labs:   Recent Labs Lab 07/19/13 1908 07/20/13 0730  07/21/13 0244  NA 147 146 146  K 3.8 4.0 4.2  CL 108 110 110  CO2 23 20 22   BUN 68* 71* 67*  CREATININE 6.24* 6.03* 5.96*  CALCIUM 9.0 8.8 8.6  MG  --  2.0  --   PHOS  --  4.2  --   GLUCOSE 144* 94 100*    CBG (last 3)   Recent Labs  07/22/13 0007 07/22/13 0414 07/22/13 0846  GLUCAP 126* 99 100*    Scheduled Meds: . amLODipine  10 mg Oral Daily  . atorvastatin  40 mg Oral QPM  . cefTRIAXone (ROCEPHIN)  IV  1 g Intravenous Q24H  . cloNIDine  0.1 mg Oral TID  . docusate sodium  100 mg Oral BID  . hydrALAZINE  25 mg Oral 3 times per day  . insulin aspart  0-9 Units Subcutaneous 6 times per day  . metoprolol tartrate  25 mg Oral BID  . naphazoline-pheniramine  1 drop Both Eyes BID  . pantoprazole  40 mg Oral Daily  . polyethylene glycol  17 g Oral Daily  . sodium chloride  3 mL Intravenous Q12H    Continuous Infusions: . sodium chloride 50 mL/hr at 07/22/13 0434    Jarold MottoSamantha Dupree Givler MS, RD, LDN Inpatient Registered Dietitian Pager: [phone] After-hours pager: [phone]

## 2013-07-22 NOTE — Progress Notes (Addendum)
[facility] PROGRESS NOTE  April [name] ZOX:[id number] DOB: 08/23/1921 DOA: 07/19/2013 PCP: Jeanann Lewandowsky, MD  Assessment/Plan: Sepsis - given hypothermia and UTI presumed sepsis, cover with IV antibiotics,  -resolved -transfer out of step down  Diabetes mellitus - SSI  HTN- titrate medications for tighter control- will proceed slowly  Acute on chronic renal failure -  Baseline appears to be 3 -renal U/S shows: Increased renal cortical echogenicity as can be seen with chronic  medical renal disease.  Echogenic material within the bladder lumen which does not move with repositioning of the patient. No definite internal vascular  blood flow is demonstrated. This may represent thick debris versus an underlying mass. Recommend correlation with urinalysis and direct visualization as clinically indicated to exclude underlying mass. Hypoechoic lesion within the left kidney measuring up to 1.3 cm. This does not meet definite criteria for a simple cyst as there is internal echogenicity. No definite vascular flow is identified. Recommend correlation with either short-term (3 months) followup ultrasound or definitive characterization with CT or MRI. -renal consult- no a candidate for dialysis -foley placed for strict I/Os - spoke with son regarding palliative care- will be here today  Dementia- ?baseline -appears to be at baseline  UTI (lower urinary tract infection)- Kleb PNA - on rocephin  Anemia - anemia panel, hemoccult stool   Hypoalbuminemia - check prealbumin, nutrition consult   Hypothermia presumed to be due to sepsis, TSH   Code Status: full- still trying to clarify with family Family Communication: reached son on home phone Disposition Plan: per family discussions   Consultants:  renal  Procedures:    Antibiotics:  Rocephin 2/14  HPI/Subjective: Moaning today  Objective: Filed Vitals:   07/22/13 0843  BP: 183/65  Pulse: 72  Temp: 97.5 F (36.4  C)  Resp: 19    Intake/Output Summary (Last 24 hours) at 07/22/13 1022 Last data filed at 07/21/13 2154  Gross per 24 hour  Intake    123 ml  Output    300 ml  Net   -177 ml   Filed Weights   07/21/13 0400 07/21/13 2037  Weight: 48.9 kg (107 lb 12.9 oz) 49.896 kg (110 lb)    Exam:   General:   Elderly appearing  Cardiovascular: rrr  Respiratory: clear anterior  Abdomen: +BS, soft  Musculoskeletal: no LE edema  Data Reviewed: Basic Metabolic Panel:  Recent Labs Lab 07/16/13 1119 07/19/13 1908 07/20/13 0730 07/21/13 0244  NA 140 147 146 146  K 4.7 3.8 4.0 4.2  CL 105 108 110 110  CO2 21 23 20 22   GLUCOSE 202* 144* 94 100*  BUN 67* 68* 71* 67*  CREATININE 6.10* 6.24* 6.03* 5.96*  CALCIUM 9.3 9.0 8.8 8.6  MG  --   --  2.0  --   PHOS  --   --  4.2  --    Liver Function Tests:  Recent Labs Lab 07/16/13 1119 07/19/13 1908 07/20/13 0730  AST 30 26 22   ALT 22 23 20   ALKPHOS 88 80 71  BILITOT 0.3 <0.2* <0.2*  PROT 6.3 6.3 5.5*  ALBUMIN 3.0* 2.5* 2.1*   No results found for this basename: LIPASE, AMYLASE,  in the last 168 hours No results found for this basename: AMMONIA,  in the last 168 hours CBC:  Recent Labs Lab 07/16/13 1119 07/19/13 1908 07/20/13 0730 07/21/13 0244  WBC 5.6 4.3 4.8 4.4  NEUTROABS 3.4 2.9  --   --   HGB 9.4* 8.0* 7.3* 8.0*  HCT 28.4* 23.7* 21.3* 24.2*  MCV 83.0 82.3 82.2 83.4  PLT 345 362 311 351   Cardiac Enzymes:  Recent Labs Lab 07/20/13 0736 07/20/13 1118 07/20/13 1800  TROPONINI <0.30 <0.30 <0.30   BNP (last 3 results) No results found for this basename: PROBNP,  in the last 8760 hours CBG:  Recent Labs Lab 07/21/13 1635 07/21/13 2035 07/22/13 0007 07/22/13 0414 07/22/13 0846  GLUCAP 152* 148* 126* 99 100*    Recent Results (from the past 240 hour(s))  URINE CULTURE     Status: None   Collection Time    07/19/13 11:44 PM      Result Value Ref Range Status   Specimen Description URINE,  CATHETERIZED   Final   Special Requests Normal   Final   Culture  Setup Time     Final   Value: 07/20/2013 12:42     Performed at [facility] Count     Final   Value: >=100,000 COLONIES/ML     Performed at [facility]   Culture     Final   Value: KLEBSIELLA PNEUMONIAE     Performed at [facility]   Report Status 07/21/2013 FINAL   Final   Organism ID, Bacteria KLEBSIELLA PNEUMONIAE   Final  MRSA PCR SCREENING     Status: None   Collection Time    07/20/13  5:07 AM      Result Value Ref Range Status   MRSA by PCR NEGATIVE  NEGATIVE Final   Comment:            The GeneXpert MRSA Assay (FDA     approved for NASAL specimens     only), is one component of a     comprehensive MRSA colonization     surveillance program. It is not     intended to diagnose MRSA     infection nor to guide or     monitor treatment for     MRSA infections.  CULTURE, BLOOD (ROUTINE X 2)     Status: None   Collection Time    07/20/13  7:18 AM      Result Value Ref Range Status   Specimen Description BLOOD RIGHT ARM   Final   Special Requests BOTTLES DRAWN AEROBIC AND ANAEROBIC 10CC   Final   Culture  Setup Time     Final   Value: 07/20/2013 16:11     Performed at [facility]   Culture     Final   Value:        BLOOD CULTURE RECEIVED NO GROWTH TO DATE CULTURE WILL BE HELD FOR 5 DAYS BEFORE ISSUING A FINAL NEGATIVE REPORT     Performed at [facility]   Report Status PENDING   Incomplete  CULTURE, BLOOD (ROUTINE X 2)     Status: None   Collection Time    07/20/13  7:30 AM      Result Value Ref Range Status   Specimen Description BLOOD RIGHT HAND   Final   Special Requests BOTTLES DRAWN AEROBIC ONLY 5CC   Final   Culture  Setup Time     Final   Value: 07/20/2013 16:11     Performed at [facility]   Culture     Final   Value:        BLOOD CULTURE RECEIVED NO GROWTH TO DATE CULTURE WILL BE HELD FOR 5 DAYS BEFORE ISSUING A FINAL NEGATIVE  REPORT     [facility]   Report Status PENDING   Incomplete     Studies: [place]  07/20/2013   CLINICAL DATA:  Renal failure  EXAM: RENAL/URINARY TRACT ULTRASOUND COMPLETE  COMPARISON:  None.  FINDINGS: Right Kidney:  Length: 9.5 cm. Normal renal cortical thickness. Mild increased parenchymal echogenicity. No hydronephrosis. There is a sub centimeter hypoechoic lesion within the superior pole of the right kidney, too small accurately characterize.  Left Kidney:  Length: 9.6 cm. Normal renal cortical thickness. Increased renal parenchymal echogenicity. No hydronephrosis. There is a 1.3 cm hypoechoic lesion within the inferior pole of the left kidney. Small amount of fluid about the left kidney.  Bladder:  There is nonmobile echogenic material demonstrated within the bladder. No definite internal vascularity is identified.  IMPRESSION: 1. Increased renal cortical echogenicity as can be seen with chronic medical renal disease. 2. Echogenic material within the bladder lumen which does not move with repositioning of the patient. No definite internal vascular blood flow is demonstrated. This may represent thick debris versus an underlying mass. Recommend correlation with urinalysis and direct visualization as clinically indicated to exclude underlying mass. 3. Small amount of fluid about the left kidney. 4. Hypoechoic lesion within the left kidney measuring up to 1.3 cm. This does not meet definite criteria for a simple cyst as there is internal echogenicity. No definite vascular flow is identified. Recommend correlation with either short-term (3 months) followup ultrasound or definitive characterization with CT or MRI. These results will be called to the ordering clinician or representative by the Radiologist Assistant, and communication documented in the PACS Dashboard.   Electronically Signed   By: Annia Beltrew  Davis M.D.   On: 07/20/2013 12:23    Scheduled Meds: . amLODipine  10 mg Oral  Daily  . atorvastatin  40 mg Oral QPM  . cefTRIAXone (ROCEPHIN)  IV  1 g Intravenous Q24H  . cloNIDine  0.1 mg Oral TID  . docusate sodium  100 mg Oral BID  . hydrALAZINE  25 mg Oral 3 times per day  . insulin aspart  0-9 Units Subcutaneous 6 times per day  . metoprolol tartrate  25 mg Oral BID  . naphazoline-pheniramine  1 drop Both Eyes BID  . pantoprazole  40 mg Oral Daily  . polyethylene glycol  17 g Oral Daily  . sodium chloride  3 mL Intravenous Q12H   Continuous Infusions: . sodium chloride 50 mL/hr at 07/22/13 0434    Active Problems:   Diabetes mellitus   CKD (chronic kidney disease) stage 3, GFR 30-59 ml/min   Acute on chronic renal failure   UTI (lower urinary tract infection)   Anemia   Hypoalbuminemia   Sepsis   Hypothermia    Time spent: 35 min    April [name]  [facility] Pager [phone] If 7PM-7AM, please contact night-coverage at [email or web address], password [email or web address] 07/22/2013, 10:22 AM  LOS: 3 days

## 2013-07-23 LAB — CBC
HEMATOCRIT: 24.1 % — AB (ref 36.0–46.0)
HEMOGLOBIN: 8 g/dL — AB (ref 12.0–15.0)
MCH: 27.7 pg (ref 26.0–34.0)
MCHC: 33.2 g/dL (ref 30.0–36.0)
MCV: 83.4 fL (ref 78.0–100.0)
Platelets: 342 10*3/uL (ref 150–400)
RBC: 2.89 MIL/uL — AB (ref 3.87–5.11)
RDW: 15.9 % — AB (ref 11.5–15.5)
WBC: 6.2 10*3/uL (ref 4.0–10.5)

## 2013-07-23 LAB — BASIC METABOLIC PANEL
BUN: 66 mg/dL — ABNORMAL HIGH (ref 6–23)
CHLORIDE: 109 meq/L (ref 96–112)
CO2: 18 mEq/L — ABNORMAL LOW (ref 19–32)
Calcium: 8.7 mg/dL (ref 8.4–10.5)
Creatinine, Ser: 5.86 mg/dL — ABNORMAL HIGH (ref 0.50–1.10)
GFR, EST AFRICAN AMERICAN: 7 mL/min — AB (ref >90)
GFR, EST NON AFRICAN AMERICAN: 6 mL/min — AB (ref >90)
Glucose, Bld: 176 mg/dL — ABNORMAL HIGH (ref 70–99)
POTASSIUM: 4.2 meq/L (ref 3.7–5.3)
SODIUM: 142 meq/L (ref 137–147)

## 2013-07-23 LAB — GLUCOSE, CAPILLARY
GLUCOSE-CAPILLARY: 145 mg/dL — AB (ref 70–99)
GLUCOSE-CAPILLARY: 148 mg/dL — AB (ref 70–99)
GLUCOSE-CAPILLARY: 163 mg/dL — AB (ref 70–99)
GLUCOSE-CAPILLARY: 174 mg/dL — AB (ref 70–99)
Glucose-Capillary: 219 mg/dL — ABNORMAL HIGH (ref 70–99)

## 2013-07-23 NOTE — Progress Notes (Signed)
[facility] PROGRESS NOTE  Graycee Greeson ZOX:[id number] DOB: [birth year] DOA: 07/19/2013 PCP: Jeanann Lewandowsky, MD  April [name] is a 80 y.o. female  has a past medical history of Diabetes mellitus; Hypertension; Hyperlipidemia; Pneumonia; Subdural hematoma; Arthritis; DVT (deep venous thrombosis); Incontinence; and Renal disorder.  Presented with  Patient was recently evaluated by her PCP and had some labs taken showing worsening renal function with creating going up to 6.2 from baseline of 3 a year ago. No family at bedside patient is unaware why she is here. Denies any pain, denies poor appetite, no nausea or vomiting. In ER patient was noted to be hypothermic down to 95 degrees rectally. Blood pressure in 200's. Per records she was supposed to start on Diovan but have not filled in prescriptions. Hospitalist called for admission  Assessment/Plan: Sepsis - given hypothermia and UTI (Kleb PNa) presumed sepsis, cover with IV antibiotics,  -resolved -transfer out of step down  Diabetes mellitus - SSI  HTN- titrate medications for tighter control- will proceed slowly  Acute on chronic renal failure -  Baseline appears to be 3 -renal U/S shows: Increased renal cortical echogenicity as can be seen with chronic medical renal disease.  Echogenic material within the bladder lumen which does not move with repositioning of the patient. No definite internal vascular  blood flow is demonstrated. This may represent thick debris versus an underlying mass. Recommend correlation with urinalysis and direct visualization as clinically indicated to exclude underlying mass. Hypoechoic lesion within the left kidney measuring up to 1.3 cm. This does not meet definite criteria for a simple cyst as there is internal echogenicity. No definite vascular flow is identified. Recommend correlation with either short-term (3 months) followup ultrasound or definitive characterization with CT or MRI. -renal consult-  no a candidate for dialysis -foley placed for strict I/Os - spoke with son regarding palliative care- consult- agreeable  Dementia- ?baseline -appears to be at baseline  UTI (lower urinary tract infection)- Kleb PNA - on rocephin  Anemia - anemia panel, hemoccult stool   Hypoalbuminemia -  Prealbumin ok, nutrition consult   Hypothermia presumed to be due to sepsis vs being out in the cold for an extended time   Code Status: full Family Communication: son at bedside today Disposition Plan: per family discussions   Consultants:  renal  Procedures:    Antibiotics:  Rocephin 2/14  HPI/Subjective: No new c/o today  Objective: Filed Vitals:   07/23/13 0925  BP: 193/74  Pulse: 88  Temp: 97.7 F (36.5 C)  Resp: 17    Intake/Output Summary (Last 24 hours) at 07/23/13 1043 Last data filed at 07/23/13 0925  Gross per 24 hour  Intake    240 ml  Output    225 ml  Net     15 ml   Filed Weights   07/21/13 0400 07/21/13 2037 07/22/13 1959  Weight: 48.9 kg (107 lb 12.9 oz) 49.896 kg (110 lb) 51.302 kg (113 lb 1.6 oz)    Exam:   General:   Elderly appearing  Cardiovascular: rrr  Respiratory: clear anterior  Abdomen: +BS, soft  Musculoskeletal: no LE edema  Data Reviewed: Basic Metabolic Panel:  Recent Labs Lab 07/16/13 1119 07/19/13 1908 07/20/13 0730 07/21/13 0244 07/23/13 0400  NA 140 147 146 146 142  K 4.7 3.8 4.0 4.2 4.2  CL 105 108 110 110 109  CO2 21 23 20 22  18*  GLUCOSE 202* 144* 94 100* 176*  BUN 67* 68* 71* 67* 66*  CREATININE  6.10* 6.24* 6.03* 5.96* 5.86*  CALCIUM 9.3 9.0 8.8 8.6 8.7  MG  --   --  2.0  --   --   PHOS  --   --  4.2  --   --    Liver Function Tests:  Recent Labs Lab 07/16/13 1119 07/19/13 1908 07/20/13 0730  AST 30 26 22   ALT 22 23 20   ALKPHOS 88 80 71  BILITOT 0.3 <0.2* <0.2*  PROT 6.3 6.3 5.5*  ALBUMIN 3.0* 2.5* 2.1*   No results found for this basename: LIPASE, AMYLASE,  in the last 168 hours No  results found for this basename: AMMONIA,  in the last 168 hours CBC:  Recent Labs Lab 07/16/13 1119 07/19/13 1908 07/20/13 0730 07/21/13 0244 07/23/13 0400  WBC 5.6 4.3 4.8 4.4 6.2  NEUTROABS 3.4 2.9  --   --   --   HGB 9.4* 8.0* 7.3* 8.0* 8.0*  HCT 28.4* 23.7* 21.3* 24.2* 24.1*  MCV 83.0 82.3 82.2 83.4 83.4  PLT 345 362 311 351 342   Cardiac Enzymes:  Recent Labs Lab 07/20/13 0736 07/20/13 1118 07/20/13 1800  TROPONINI <0.30 <0.30 <0.30   BNP (last 3 results) No results found for this basename: PROBNP,  in the last 8760 hours CBG:  Recent Labs Lab 07/22/13 1639 07/22/13 1957 07/22/13 2355 07/23/13 0418 07/23/13 0741  GLUCAP 209* 266* 259* 145* 148*    Recent Results (from the past 240 hour(s))  URINE CULTURE     Status: None   Collection Time    07/19/13 11:44 PM      Result Value Ref Range Status   Specimen Description URINE, CATHETERIZED   Final   Special Requests Normal   Final   Culture  Setup Time     Final   Value: 07/20/2013 12:42     Performed at [facility] Count     Final   Value: >=100,000 COLONIES/ML     Performed at [facility]   Culture     Final   Value: KLEBSIELLA PNEUMONIAE     Performed at [facility]   Report Status 07/21/2013 FINAL   Final   Organism ID, Bacteria KLEBSIELLA PNEUMONIAE   Final  MRSA PCR SCREENING     Status: None   Collection Time    07/20/13  5:07 AM      Result Value Ref Range Status   MRSA by PCR NEGATIVE  NEGATIVE Final   Comment:            The GeneXpert MRSA Assay (FDA     approved for NASAL specimens     only), is one component of a     comprehensive MRSA colonization     surveillance program. It is not     intended to diagnose MRSA     infection nor to guide or     monitor treatment for     MRSA infections.  CULTURE, BLOOD (ROUTINE X 2)     Status: None   Collection Time    07/20/13  7:18 AM      Result Value Ref Range Status   Specimen Description BLOOD RIGHT  ARM   Final   Special Requests BOTTLES DRAWN AEROBIC AND ANAEROBIC 10CC   Final   Culture  Setup Time     Final   Value: 07/20/2013 16:11     Performed at [facility]   Culture     Final  Value:        BLOOD CULTURE RECEIVED NO GROWTH TO DATE CULTURE WILL BE HELD FOR 5 DAYS BEFORE ISSUING A FINAL NEGATIVE REPORT     [facility]   Report Status PENDING   Incomplete  CULTURE, BLOOD (ROUTINE X 2)     Status: None   Collection Time    07/20/13  7:30 AM      Result Value Ref Range Status   Specimen Description BLOOD RIGHT HAND   Final   Special Requests BOTTLES DRAWN AEROBIC ONLY 5CC   Final   Culture  Setup Time     Final   Value: 07/20/2013 16:11     [facility]formed at [facility]   Culture     Final   Value:        BLOOD CULTURE RECEIVED NO GROWTH TO DATE CULTURE WILL BE HELD FOR 5 DAYS BEFORE ISSUING A FINAL NEGATIVE REPORT     [facility]formed at [facility]   Report Status PENDING   Incomplete     Studies: No results found.  Scheduled Meds: . amLODipine  10 mg Oral Daily  . atorvastatin  40 mg Oral QPM  . cefTRIAXone (ROCEPHIN)  IV  1 g Intravenous Q24H  . cloNIDine  0.1 mg Oral TID  . docusate sodium  100 mg Oral BID  . feeding supplement (ENSURE COMPLETE)  237 mL Oral BID BM  . hydrALAZINE  25 mg Oral 3 times per day  . insulin aspart  0-9 Units Subcutaneous 6 times per day  . metoprolol tartrate  25 mg Oral BID  . naphazoline-pheniramine  1 drop Both Eyes BID  . pantoprazole  40 mg Oral Daily  . polyethylene glycol  17 g Oral Daily  . sodium chloride  3 mL Intravenous Q12H   Continuous Infusions: . sodium chloride 75 mL/hr at 07/22/13 2158    Active Problems:   Diabetes mellitus   CKD (chronic kidney disease) stage 3, GFR 30-59 ml/min   Acute on chronic renal failure   UTI (lower urinary tract infection)   Anemia   Hypoalbuminemia   Sepsis   Hypothermia    Time spent: 35 min    VANN, JESSICA  [facility] Pager [phone] If 7PM-7AM, please contact night-coverage at [email or web address], password [email or web address] 07/23/2013, 10:43 AM  LOS: 4 days

## 2013-07-23 NOTE — Progress Notes (Addendum)
Thank you for consulting the Palliative Medicine Team at [facility] to meet your patient's and family's needs.   The reason that you asked us to see your patient is for GOC and options.  We have scheduled your patient for a meeting: 07/24/13 11am  The Surrogate decision make is: Georgann Housekeeperric Wallander  Contact information: [phone]   Your patient is able/unable to participate: unlikely  Yong ChannelAlicia Morrison Masser, NP Palliative Medicine Team Pager # [phone] Team Phone # [phone]

## 2013-07-23 NOTE — Evaluation (Signed)
Physical Therapy Evaluation Patient Details Name: April [name] MRN: [id number] DOB: 09/03/1921 Today's Date: 07/23/2013 Time: 7829-5621 PT Time Calculation (min): 27 min  PT Assessment / Plan / Recommendation History of Present Illness  Pt admit with sepsis and DM.    Clinical Impression  Pt admitted with above. Pt currently with functional limitations due to the deficits listed below (see PT Problem List).  Pt will benefit from skilled PT to increase their independence and safety with mobility to allow discharge to the venue listed below.    PT Assessment  Patient needs continued PT services    Follow Up Recommendations  Home health PT;Supervision/Assistance - 24 hour (Will need SNF if son cannot assist x 24 hours)    Does the patient have the potential to tolerate intense rehabilitation      Barriers to Discharge        Equipment Recommendations  Other (comment) (TBA)    Recommendations for Other Services     Frequency Min 3X/week    Precautions / Restrictions Precautions Precautions: Fall Restrictions Weight Bearing Restrictions: No   Pertinent Vitals/Pain VSS, no pain      Mobility  Bed Mobility Overal bed mobility: Needs Assistance;+2 for physical assistance Bed Mobility: Supine to Sit Supine to sit: Mod assist;+2 for physical assistance General bed mobility comments: Assist for elevation of trunk and for LES. Transfers Overall transfer level: Needs assistance Transfers: Sit to/from Stand;Stand Pivot Transfers Sit to Stand: Mod assist;+2 physical assistance Stand pivot transfers: Mod assist;+2 physical assistance General transfer comment: Pt needed HHA bil with posterior lean  needing faciliation for anterior lean.  Pt needed assist and cues for weight shifting bil.      Exercises     PT Diagnosis: Generalized weakness  PT Problem List: Decreased activity tolerance;Decreased balance;Decreased mobility;Decreased knowledge of precautions;Decreased safety  awareness;Decreased knowledge of use of DME PT Treatment Interventions: DME instruction;Gait training;Functional mobility training;Therapeutic activities;Therapeutic exercise;Balance training;Patient/family education     PT Goals(Current goals can be found in the care plan section) Acute Rehab PT Goals Patient Stated Goal: unable to state PT Goal Formulation: With patient Time For Goal Achievement: 07/30/13 Potential to Achieve Goals: Fair  Visit Information  Last PT Received On: 07/23/13 Assistance Needed: +2 History of Present Illness: Pt admit with sepsis and DM.         Prior Functioning  Home Living Family/patient expects to be discharged to:: Private residence Living Arrangements: Children Available Help at Discharge: Family;Available 24 hours/day Type of Home: House Home Access: Stairs to enter [employer] of Steps: 3-4 Entrance Stairs-Rails: Right Home Layout: One level Home Equipment: Walker - 2 wheels;Shower seat Additional Comments: pt has 24 hour care at home and lives with son, April [name] Prior Function Level of Independence: Needs assistance Gait / Transfers Assistance Needed: gait with RW, likely at least supervision PTA Communication / Swallowing Assistance Needed: none Comments: All information above was from previous admit.  No family pressent to clarify.   Communication Communication: No difficulties Dominant Hand: Right    Cognition  Cognition Arousal/Alertness: Lethargic Behavior During Therapy: Flat affect Overall Cognitive Status: History of cognitive impairments - at baseline    Extremity/Trunk Assessment Upper Extremity Assessment Upper Extremity Assessment: Defer to OT evaluation Lower Extremity Assessment Lower Extremity Assessment: Generalized weakness Cervical / Trunk Assessment Cervical / Trunk Assessment: Kyphotic   Balance Balance Overall balance assessment: Needs assistance;History of Falls Sitting-balance support: Bilateral  upper extremity supported;Feet supported Sitting balance-Leahy Scale: Poor Sitting balance - Comments: posterior lean  Postural control: Posterior lean Standing balance support: Bilateral upper extremity supported;During functional activity Standing balance-Leahy Scale: Poor Standing balance comment: Needed assist for upright stance  bil UEs.    End of Session PT - End of Session Equipment Utilized During Treatment: Gait belt Activity Tolerance: Patient limited by fatigue Patient left: in chair;with call bell/phone within reach;with chair alarm set Nurse Communication: Mobility status  GP     [name],April Dai 07/23/2013, 3:27 PM [name],PT Acute Rehabilitation [phone] (pager)

## 2013-07-24 DIAGNOSIS — Z515 Encounter for palliative care: Secondary | ICD-10-CM

## 2013-07-24 DIAGNOSIS — E8809 Other disorders of plasma-protein metabolism, not elsewhere classified: Secondary | ICD-10-CM

## 2013-07-24 LAB — GLUCOSE, CAPILLARY
GLUCOSE-CAPILLARY: 157 mg/dL — AB (ref 70–99)
Glucose-Capillary: 101 mg/dL — ABNORMAL HIGH (ref 70–99)
Glucose-Capillary: 166 mg/dL — ABNORMAL HIGH (ref 70–99)
Glucose-Capillary: 63 mg/dL — ABNORMAL LOW (ref 70–99)
Glucose-Capillary: 80 mg/dL (ref 70–99)
Glucose-Capillary: 94 mg/dL (ref 70–99)

## 2013-07-24 MED ORDER — HYDRALAZINE HCL 50 MG PO TABS
50.0000 mg | ORAL_TABLET | Freq: Three times a day (TID) | ORAL | Status: DC
  Administered 2013-07-24 – 2013-07-26 (×5): 50 mg via ORAL
  Filled 2013-07-24 (×8): qty 1

## 2013-07-24 MED ORDER — BISACODYL 10 MG RE SUPP
10.0000 mg | Freq: Once | RECTAL | Status: AC
  Administered 2013-07-24: 10 mg via RECTAL
  Filled 2013-07-24: qty 1

## 2013-07-24 NOTE — Progress Notes (Signed)
   CARE MANAGEMENT NOTE 07/24/2013  Patient:  JERNIE, SCHUTT   Account Number:  [id number]  Date Initiated:  07/23/2013  Documentation initiated by:  Lizabeth Leyden  Subjective/Objective Assessment:   admitted with ARF, CR 6.2, hypothermic temp 95  lives at home with son     Action/Plan:   progression of care and discharge planning   Anticipated DC Date:  07/25/2013   Anticipated DC Plan:  [place]  CM consult      PAC Choice  HOSPICE   Choice offered to / List presented to:  C-4 Adult Children           [facility]   Status of service:  Completed, signed off Medicare Important Message given?   (If response is "NO", the following Medicare IM given date fields will be blank) Date Medicare IM given:   Date Additional Medicare IM given:    Discharge Disposition:  HOME W HOSPICE CARE  Per UR Regulation:    If discussed at Long Length of Stay Meetings, dates discussed:    Comments: Contact: son Mechele Collin:  [phone]    Home: [phone]  07/24/2013  [place], [place] [phone] CM referral: Home Hospice  Met with son to discuss home hospice and offer choice. He selected [facility].  [facility]  [phone] spoke with Caryl Pina, gave referral and contact information for son plan discharge Thursday,will need hospital bed, overbed table FAX:  [phone]  face sheet, H&P, progress notes, will send DC summary when available  07/23/2013  [place], [place] plan palliative care meeting.

## 2013-07-24 NOTE — Progress Notes (Signed)
Sacral prophylactic dsg changed no skin breakdown noted at this time.

## 2013-07-24 NOTE — Consult Note (Signed)
Patient WU:JWJXB Hokenson      DOB: [birth year]      JYN:[id number]     Consult Note from the Palliative Medicine Team at [facility] Requested by: Dr. Eliseo Squires     PCP: Angelica Chessman, MD Reason for Consultation: [place] and options.    Phone Number:[phone]  Assessment of patients Current state: Ms. Santoli 80 yo female with renal failure with creatinine 5.86 (baseline 3) and GFR 6. Ms. Lwin also has PMH DM, HTN, pnemonia, subdural hematoma, arthritis and DVT. I met today with her and her son, Randall Hiss [phone], [phone]), who she lives since 2012. Randall Hiss tells me that she has been declining at home prior to hospital admission and is unable to ambulate without his assistance and has become incontinent. He tells me that he understands that her health will decline with her age. He has been working on his house since she has been hospitalized to make it more functional for her to return too. He has his own Deal[employer] business and has worked from home since he has been caring for his mother so that he is available to her 24/7. He wishes for her to return back to his home. He confirmed that he has spoken with his siblings in [place] and that they wish for Ms. Pal to be DNR. We also discussed that she has limited prognosis with her renal failure of weeks to months and will be eligible for hospice. He wishes to pursue hospice care in his home to help his mother. We also discussed MOST form and the concept of full comfort care and not returning to the hospital. I provided him with a copy of Hard Choices and MOST to review. He is going to consider this option. I will continue to support Ms. Briceno and her son while they make difficult decisions for her care.    Goals of Care: 1.  Code Status: DNR   2. Scope of Treatment: Continue treatment for now. DNR.    4. Disposition: Home with hospice.   3. Symptom Management:   1. Pain: Vicodin prn.  2. Bowel Regimen: Colace and Miralax  scheduled. Dulcolax supp x 1. 3. Fever: Acetaminophen prn.  4. Nausea/Vomiting: Ondansetron prn.  5. Weakness: Continue medical management. PT following.  4. Psychosocial: Emotional support provided to patient and son during difficult conversation.   5. Spiritual: Spiritual care consulted as offered and accepted by patient.    Patient Documents Completed or Given: Document Given Completed  Advanced Directives Pkt    MOST yes   DNR  yes  Gone from My Sight    Hard Choices yes     Brief HPI: 80 yo female with renal failure.    ROS: Denies pain, nausea, anxiety, sleep disturbance.     PMH:  Past Medical History  Diagnosis Date  . Diabetes mellitus   . Hypertension   . Hyperlipidemia   . Pneumonia   . Subdural hematoma   . Arthritis   . DVT (deep venous thrombosis)   . Incontinence   . Renal disorder     chronic kidney dz stage III     PSH: Past Surgical History  Procedure Laterality Date  . Insertion of vena cava filter    . Craniotomy Right 07/23/2012    Procedure: CRANIOTOMY HEMATOMA EVACUATION SUBDURAL;  Surgeon: Hosie Spangle, MD;  Location: [place] [facility] OR[facility];  Service: Neurosurgery;  Laterality: Right;  . Craniotomy N/A 08/06/2012    Procedure: CRANIOTOMY HEMATOMA EVACUATION  SUBDURAL;  Surgeon: Hosie Spangle, MD;  Location: [place] [facility] ORS;  Service: Neurosurgery;  Laterality: N/A;  Craniectomy for evacuation of subdural hematoma, implantation of bone flap in abdominal wall   . Brain surgery    . Craniotomy N/A 12/12/2012    Procedure: CRANIOTOMY BONE FLAP/PROSTHETIC PLATE retrieval of bone from abdomen;  Surgeon: Hosie Spangle, MD;  Location: [place] [facility]UR[facility] ORS;  Service: Neurosurgery;  Laterality: N/A;  cranioplasty with retrieval of bone flap from abdominal wall pocket. Site is head and abdomen.   I have reviewed the [place] and SH and  If appropriate update it with new information. No Known Allergies Scheduled Meds: . amLODipine  10 mg Oral Daily  . atorvastatin   40 mg Oral QPM  . cefTRIAXone (ROCEPHIN)  IV  1 g Intravenous Q24H  . cloNIDine  0.1 mg Oral TID  . docusate sodium  100 mg Oral BID  . feeding supplement (ENSURE COMPLETE)  237 mL Oral BID BM  . hydrALAZINE  25 mg Oral 3 times per day  . insulin aspart  0-9 Units Subcutaneous 6 times per day  . metoprolol tartrate  25 mg Oral BID  . naphazoline-pheniramine  1 drop Both Eyes BID  . pantoprazole  40 mg Oral Daily  . polyethylene glycol  17 g Oral Daily  . sodium chloride  3 mL Intravenous Q12H   Continuous Infusions: . sodium chloride 75 mL/hr at 07/24/13 0646   PRN Meds:.acetaminophen, acetaminophen, hydrALAZINE, HYDROcodone-acetaminophen, ondansetron (ZOFRAN) IV, ondansetron    BP 179/72  Pulse 81  Temp(Src) 97.9 F (36.6 C) (Oral)  Resp 18  Ht _0  (1.626 m)  Wt 53.025 kg (116 lb 14.4 oz)  BMI 20.06 kg/m2  SpO2 98%   PPS: 30%   Intake/Output Summary (Last 24 hours) at 07/24/13 1217 Last data filed at 07/24/13 0900  Gross per 24 hour  Intake 1342.5 ml  Output    500 ml  Net  842.5 ml   LBM: Prior to admission (07/19/13)                       Stool Softner: yes  Physical Exam:  General: NAD, alert, pleasant HEENT: [place]/AT, mucous membranes moist, no JVD Chest: CTA throughout, no labored breathes, symmetric CVS: RRR, S1 S2 Abdomen: Soft, NT, distended, +BS Ext: MAE, trace bilateral ankle edema, warm to touch Neuro: Alert, confused at times, follows commands  Labs: CBC    Component Value Date/Time   WBC 6.2 07/23/2013 0400   RBC 2.89* 07/23/2013 0400   RBC 2.59* 07/20/2013 0730   HGB 8.0* 07/23/2013 0400   HCT 24.1* 07/23/2013 0400   PLT 342 07/23/2013 0400   MCV 83.4 07/23/2013 0400   MCH 27.7 07/23/2013 0400   MCHC 33.2 07/23/2013 0400   RDW 15.9* 07/23/2013 0400   LYMPHSABS 1.1 07/19/2013 1908   MONOABS 0.3 07/19/2013 1908   EOSABS 0.1 07/19/2013 1908   BASOSABS 0.0 07/19/2013 1908    BMET    Component Value Date/Time   NA 142 07/23/2013 0400   K 4.2  07/23/2013 0400   CL 109 07/23/2013 0400   CO2 18* 07/23/2013 0400   GLUCOSE 176* 07/23/2013 0400   BUN 66* 07/23/2013 0400   CREATININE 5.86* 07/23/2013 0400   CREATININE 6.10* 07/16/2013 1119   CALCIUM 8.7 07/23/2013 0400   GFRNONAA 6* 07/23/2013 0400   GFRAA 7* 07/23/2013 0400    CMP     Component Value Date/Time  NA 142 07/23/2013 0400   K 4.2 07/23/2013 0400   CL 109 07/23/2013 0400   CO2 18* 07/23/2013 0400   GLUCOSE 176* 07/23/2013 0400   BUN 66* 07/23/2013 0400   CREATININE 5.86* 07/23/2013 0400   CREATININE 6.10* 07/16/2013 1119   CALCIUM 8.7 07/23/2013 0400   PROT 5.5* 07/20/2013 0730   ALBUMIN 2.1* 07/20/2013 0730   AST 22 07/20/2013 0730   ALT 20 07/20/2013 0730   ALKPHOS 71 07/20/2013 0730   BILITOT <0.2* 07/20/2013 0730   GFRNONAA 6* 07/23/2013 0400   GFRAA 7* 07/23/2013 0400     Time In Time Out Total Time Spent with Patient Total Overall Time  1100 1220 75mn 946m    Greater than 50%  of this time was spent counseling and coordinating care related to the above assessment and plan.  AlVinie SillNP Palliative Medicine Team Pager # [phone] Phone # [phone]

## 2013-07-24 NOTE — Progress Notes (Signed)
Hypoglycemic Event  CBG: 63  Treatment: carb snack  Symptoms: none  Follow-up CBG: Time: 00:05 CBG Result:94  Possible Reasons for Event:   Comments/MD notified: yes   [place]ks, Lavetta NielsenRebecca Annette  Remember to initiate Hypoglycemia Order Set & complete

## 2013-07-24 NOTE — Progress Notes (Signed)
[facility] PROGRESS NOTE  April [name] UYQ:[id number] DOB: 1922/01/12 DOA: 07/19/2013 PCP: April Lewandowsky, MD  Assessment/Plan:  Sepsis -  -given hypothermia and UTI (Kleb PNa)   -cover with IV ceftriaxone -transfer out of step down  Diabetes mellitus  - SSI -hemoglobin A1c 6.8 HTN - titrate medications for tighter control- will proceed slowly  -Increase hydralazine to 50 mg 3 times a day -Continue amlodipine, metoprolol tartrate, clonidine Acute on chronic renal failure -  -Baseline appears to be 3  -Continue IV fluids -renal U/S shows: Increased renal cortical echogenicity as can be seen with chronic medical renal disease.  Echogenic material within the bladder lumen which does not move with repositioning of the patient. No definite internal vascular  blood flow is demonstrated. This may represent thick debris versus an underlying mass.  Recommend correlation with either short-term (3 months) followup ultrasound or definitive characterization with CT or MRI.  -renal consult- not a candidate for dialysis  -foley placed for strict I/Os  - spoke with son regarding palliative care- consult- agreeable  Dementia-  ?baseline  -appears to be at baseline  UTI (lower urinary tract infection) - Kleb PNA - on rocephin   Anemia  -Likely anemia of CKD - anemia panel--iron saturation 27%, folate>20, serum B12>2K,  -hemoccult stool   Hypoalbuminemia - Prealbumin ok, nutrition consult  GOC -DNR -home with hospice -appreciate palliative team   Family Communication:   Pt at beside Disposition Plan:   Home when medically stable       Procedures/Studies: Ct Head Wo Contrast  07/20/2013   CLINICAL DATA:  Evaluate for intracranial hemorrhage  EXAM: CT HEAD WITHOUT CONTRAST  TECHNIQUE: Contiguous axial images were obtained from the base of the skull through the vertex without intravenous contrast.  COMPARISON:  Prior CT from 02/12/2013  FINDINGS: Sequelae of prior right  frontoparietal craniotomy with cranioplasty again seen. Linear hyperdensity subjacent to the craniotomy defect likely reflects sequelae of cranioplasty/dural repair. Previously seen fluid within the subdural space has resolved.  Extensive atrophy with chronic microvascular ischemic changes again seen, unchanged. No mass lesion, midline shift, or hydrocephalus. No extra-axial fluid collection. No acute intracranial hemorrhage or large vessel territory infarct. More focal encephalomalacia within the right frontal lobe likely represents a remote infarct.  No skull fracture. Orbits are within normal limits. Scattered opacity noted within the right ethmoidal air cells. No mastoid effusion.  IMPRESSION: 1. No acute intracranial process. 2. Postoperative changes from right frontoparietal craniotomy with cranioplasty. Previously seen subdural fluid subjacent to the craniotomy site has resolved. 3. Advanced age-related atrophy with chronic microvascular ischemic disease, unchanged.   Electronically Signed   By: Rise Mu M.D.   On: 07/20/2013 04:17   US Renal  07/20/2013   CLINICAL DATA:  Renal failure  EXAM: RENAL/URINARY TRACT ULTRASOUND COMPLETE  COMPARISON:  None.  FINDINGS: Right Kidney:  Length: 9.5 cm. Normal renal cortical thickness. Mild increased parenchymal echogenicity. No hydronephrosis. There is a sub centimeter hypoechoic lesion within the superior pole of the right kidney, too small accurately characterize.  Left Kidney:  Length: 9.6 cm. Normal renal cortical thickness. Increased renal parenchymal echogenicity. No hydronephrosis. There is a 1.3 cm hypoechoic lesion within the inferior pole of the left kidney. Small amount of fluid about the left kidney.  Bladder:  There is nonmobile echogenic material demonstrated within the bladder. No definite internal vascularity is identified.  IMPRESSION: 1. Increased renal cortical echogenicity as can be seen with chronic medical renal disease. 2.  Echogenic material  within the bladder lumen which does not move with repositioning of the patient. No definite internal vascular blood flow is demonstrated. This may represent thick debris versus an underlying mass. Recommend correlation with urinalysis and direct visualization as clinically indicated to exclude underlying mass. 3. Small amount of fluid about the left kidney. 4. Hypoechoic lesion within the left kidney measuring up to 1.3 cm. This does not meet definite criteria for a simple cyst as there is internal echogenicity. No definite vascular flow is identified. Recommend correlation with either short-term (3 months) followup ultrasound or definitive characterization with CT or MRI. These results will be called to the ordering clinician or representative by the Radiologist Assistant, and communication documented in the PACS Dashboard.   Electronically Signed   By: Annia Beltrew  Davis M.D.   On: 07/20/2013 12:23         Subjective: Patient is slightly confused. No reports of respiratory distress, vomiting, diarrhea, uncontrolled pain.  Objective: Filed Vitals:   07/23/13 2003 07/24/13 0530 07/24/13 0836 07/24/13 1145  BP: 142/57 187/73 162/74 179/72  Pulse: 60 72 76 81  Temp: 97.7 F (36.5 C) 97.5 F (36.4 C) 97.3 F (36.3 C) 97.9 F (36.6 C)  TempSrc: Axillary Oral Oral Oral  Resp: 18 17 18 18   Height:      Weight: 53.025 kg (116 lb 14.4 oz)     SpO2: 100% 100% 100% 98%    Intake/Output Summary (Last 24 hours) at 07/24/13 1542 Last data filed at 07/24/13 0900  Gross per 24 hour  Intake 1222.5 ml  Output    500 ml  Net  722.5 ml   Weight change: 1.724 kg (3 lb 12.8 oz) Exam:   General:  Pt is alert, does not  follows commands appropriately, not in acute distress  HEENT: No icterus, No thrush, [name]/AT  Cardiovascular: RRR, S1/S2, no rubs, no gallops  Respiratory: CTA bilaterally, no wheezing, no crackles, no rhonchi  Abdomen: Soft/+BS, non tender, non distended, no  guarding  Extremities: trace LE edema, No lymphangitis, No petechiae, No rashes, no synovitis  Data Reviewed: Basic Metabolic Panel:  Recent Labs Lab 07/19/13 1908 07/20/13 0730 07/21/13 0244 07/23/13 0400  NA 147 146 146 142  K 3.8 4.0 4.2 4.2  CL 108 110 110 109  CO2 23 20 22  18*  GLUCOSE 144* 94 100* 176*  BUN 68* 71* 67* 66*  CREATININE 6.24* 6.03* 5.96* 5.86*  CALCIUM 9.0 8.8 8.6 8.7  MG  --  2.0  --   --   PHOS  --  4.2  --   --    Liver Function Tests:  Recent Labs Lab 07/19/13 1908 07/20/13 0730  AST 26 22  ALT 23 20  ALKPHOS 80 71  BILITOT <0.2* <0.2*  PROT 6.3 5.5*  ALBUMIN 2.5* 2.1*   No results found for this basename: LIPASE, AMYLASE,  in the last 168 hours No results found for this basename: AMMONIA,  in the last 168 hours CBC:  Recent Labs Lab 07/19/13 1908 07/20/13 0730 07/21/13 0244 07/23/13 0400  WBC 4.3 4.8 4.4 6.2  NEUTROABS 2.9  --   --   --   HGB 8.0* 7.3* 8.0* 8.0*  HCT 23.7* 21.3* 24.2* 24.1*  MCV 82.3 82.2 83.4 83.4  PLT 362 311 351 342   Cardiac Enzymes:  Recent Labs Lab 07/20/13 0736 07/20/13 1118 07/20/13 1800  TROPONINI <0.30 <0.30 <0.30   BNP: No components found with this basename: POCBNP,  CBG:  Recent Labs  Lab 07/24/13 0005 07/24/13 0040 07/24/13 0403 07/24/13 0830 07/24/13 1143  GLUCAP 63* 94 80 101* 166*    Recent Results (from the past 240 hour(s))  URINE CULTURE     Status: None   Collection Time    07/19/13 11:44 PM      Result Value Ref Range Status   Specimen Description URINE, CATHETERIZED   Final   Special Requests Normal   Final   Culture  Setup Time     Final   Value: 07/20/2013 12:42     Performed at [facility] Count     Final   Value: >=100,000 COLONIES/ML     Performed at [facility]   Culture     Final   Value: KLEBSIELLA PNEUMONIAE     Performed at [facility]   Report Status 07/21/2013 FINAL   Final   Organism ID, Bacteria KLEBSIELLA  PNEUMONIAE   Final  MRSA PCR SCREENING     Status: None   Collection Time    07/20/13  5:07 AM      Result Value Ref Range Status   MRSA by PCR NEGATIVE  NEGATIVE Final   Comment:            The GeneXpert MRSA Assay (FDA     approved for NASAL specimens     only), is one component of a     comprehensive MRSA colonization     surveillance program. It is not     intended to diagnose MRSA     infection nor to guide or     monitor treatment for     MRSA infections.  CULTURE, BLOOD (ROUTINE X 2)     Status: None   Collection Time    07/20/13  7:18 AM      Result Value Ref Range Status   Specimen Description BLOOD RIGHT ARM   Final   Special Requests BOTTLES DRAWN AEROBIC AND ANAEROBIC 10CC   Final   Culture  Setup Time     Final   Value: 07/20/2013 16:11     Performed at [facility]   Culture     Final   Value:        BLOOD CULTURE RECEIVED NO GROWTH TO DATE CULTURE WILL BE HELD FOR 5 DAYS BEFORE ISSUING A FINAL NEGATIVE REPORT     Performed at [facility]   Report Status PENDING   Incomplete  CULTURE, BLOOD (ROUTINE X 2)     Status: None   Collection Time    07/20/13  7:30 AM      Result Value Ref Range Status   Specimen Description BLOOD RIGHT HAND   Final   Special Requests BOTTLES DRAWN AEROBIC ONLY 5CC   Final   Culture  Setup Time     Final   Value: 07/20/2013 16:11     Performed at [facility]   Culture     Final   Value:        BLOOD CULTURE RECEIVED NO GROWTH TO DATE CULTURE WILL BE HELD FOR 5 DAYS BEFORE ISSUING A FINAL NEGATIVE REPORT     Performed at [facility]   Report Status PENDING   Incomplete     Scheduled Meds: . amLODipine  10 mg Oral Daily  . atorvastatin  40 mg Oral QPM  . cefTRIAXone (ROCEPHIN)  IV  1 g Intravenous Q24H  . cloNIDine  0.1 mg Oral TID  .  docusate sodium  100 mg Oral BID  . feeding supplement (ENSURE COMPLETE)  237 mL Oral BID BM  . hydrALAZINE  50 mg Oral 3 times per day  . insulin aspart  0-9  Units Subcutaneous 6 times per day  . metoprolol tartrate  25 mg Oral BID  . naphazoline-pheniramine  1 drop Both Eyes BID  . pantoprazole  40 mg Oral Daily  . polyethylene glycol  17 g Oral Daily  . sodium chloride  3 mL Intravenous Q12H   Continuous Infusions: . sodium chloride 75 mL/hr at 07/24/13 1347     Yacine Droz, DO  [facility] Pager [phone]  If 7PM-7AM, please contact night-coverage [email or web address] Password [facility] 07/24/2013, 3:42 PM   LOS: 5 days

## 2013-07-25 LAB — GLUCOSE, CAPILLARY
GLUCOSE-CAPILLARY: 89 mg/dL (ref 70–99)
GLUCOSE-CAPILLARY: 79 mg/dL (ref 70–99)
GLUCOSE-CAPILLARY: 122 mg/dL — AB (ref 70–99)
GLUCOSE-CAPILLARY: 193 mg/dL — AB (ref 70–99)
Glucose-Capillary: 129 mg/dL — ABNORMAL HIGH (ref 70–99)
Glucose-Capillary: 130 mg/dL — ABNORMAL HIGH (ref 70–99)
Glucose-Capillary: 157 mg/dL — ABNORMAL HIGH (ref 70–99)

## 2013-07-25 LAB — BASIC METABOLIC PANEL
BUN: 59 mg/dL — AB (ref 6–23)
CO2: 18 meq/L — AB (ref 19–32)
Calcium: 8.5 mg/dL (ref 8.4–10.5)
Chloride: 111 mEq/L (ref 96–112)
Creatinine, Ser: 5.85 mg/dL — ABNORMAL HIGH (ref 0.50–1.10)
GFR calc Af Amer: 7 mL/min — ABNORMAL LOW (ref >90)
GFR, EST NON AFRICAN AMERICAN: 6 mL/min — AB (ref >90)
GLUCOSE: 95 mg/dL (ref 70–99)
Potassium: 4.2 mEq/L (ref 3.7–5.3)
SODIUM: 144 meq/L (ref 137–147)

## 2013-07-25 LAB — CBC
HCT: 22.9 % — ABNORMAL LOW (ref 36.0–46.0)
HEMOGLOBIN: 7.6 g/dL — AB (ref 12.0–15.0)
MCH: 27.6 pg (ref 26.0–34.0)
MCHC: 33.2 g/dL (ref 30.0–36.0)
MCV: 83.3 fL (ref 78.0–100.0)
Platelets: 310 10*3/uL (ref 150–400)
RBC: 2.75 MIL/uL — AB (ref 3.87–5.11)
RDW: 16.1 % — ABNORMAL HIGH (ref 11.5–15.5)
WBC: 4 10*3/uL (ref 4.0–10.5)

## 2013-07-25 NOTE — Progress Notes (Signed)
[facility]  Ms. Effie ShyColeman looks well today and is more alert. She is happy that she gets to go home soon and see her grandchildren and great grandchildren. She has no complaints and says she has no pain. She is smiling and in good spirits. I spoke with her son, Minerva Areolaric, via telephone who says that hospice had equipment delivered to the home today and they are ready for discharge tomorrow. He tells me he plans to come tomorrow between 12-1pm and we plan to complete MOST form then. He is happy with the decision to have hospice involved.   Yong ChannelAlicia Eknoor Novack, NP Palliative Medicine Team Pager # [phone] Team Phone # [phone]

## 2013-07-25 NOTE — Progress Notes (Signed)
[facility] PROGRESS NOTE  Cindee LameRetha Camuso ZOX:[id number] DOB: 09/03/1921 DOA: 07/19/2013 PCP: Jeanann LewandowskyJEGEDE, OLUGBEMIGA, MD  Assessment/Plan: Sepsis -  -given hypothermia and UTI (Kleb PNa)  -continue IV ceftriaxone  Diabetes mellitus  - SSI -hemoglobin A1c 6.8  HTN  - titrate medications for tighter control- will proceed slowly  -Increase hydralazine to 50 mg 3 times a day  -Continue amlodipine, metoprolol tartrate, clonidine  Acute on chronic renal failure -  -Suspect the patient may have a new baseline -Baseline appears to be 3  -Continue IV fluids  -renal U/S shows: Increased renal cortical echogenicity as can be seen with chronic medical renal disease.  Echogenic material within the bladder lumen which does not move with repositioning of the patient. No definite internal vascular  blood flow is demonstrated. This may represent thick debris versus an underlying mass. Recommend correlation with either short-term (3 months) followup ultrasound or definitive characterization with CT or MRI.  -renal consult- not a candidate for dialysis  -foley placed for strict I/Os  Dementia-  ?baseline  -Acute decompensation likely superimposed upon the patient's acute on chronic renal failure as well as UTI  -Appears close to baseline according to the patient's son UTI (lower urinary tract infection)  - Kleb PNA - on rocephin   Anemia  -Likely anemia of CKD  - anemia panel--iron saturation 27%, folate>20, serum B12>2K,  -hemoccult stool   Hypoalbuminemia - Prealbumin ok, nutrition consult  GOC  -DNR  -home with hospice  -appreciate palliative team  Family Communication: Pt at beside  Disposition Plan: Home when medically stable         Procedures/Studies: Ct Head Wo Contrast  07/20/2013   CLINICAL DATA:  Evaluate for intracranial hemorrhage  EXAM: CT HEAD WITHOUT CONTRAST  TECHNIQUE: Contiguous axial images were obtained from the base of the skull through the vertex without  intravenous contrast.  COMPARISON:  Prior CT from 02/12/2013  FINDINGS: Sequelae of prior right frontoparietal craniotomy with cranioplasty again seen. Linear hyperdensity subjacent to the craniotomy defect likely reflects sequelae of cranioplasty/dural repair. Previously seen fluid within the subdural space has resolved.  Extensive atrophy with chronic microvascular ischemic changes again seen, unchanged. No mass lesion, midline shift, or hydrocephalus. No extra-axial fluid collection. No acute intracranial hemorrhage or large vessel territory infarct. More focal encephalomalacia within the right frontal lobe likely represents a remote infarct.  No skull fracture. Orbits are within normal limits. Scattered opacity noted within the right ethmoidal air cells. No mastoid effusion.  IMPRESSION: 1. No acute intracranial process. 2. Postoperative changes from right frontoparietal craniotomy with cranioplasty. Previously seen subdural fluid subjacent to the craniotomy site has resolved. 3. Advanced age-related atrophy with chronic microvascular ischemic disease, unchanged.   Electronically Signed   By: Rise MuBenjamin  McClintock M.D.   On: 07/20/2013 04:17   [place] Renal  07/20/2013   CLINICAL DATA:  Renal failure  EXAM: RENAL/URINARY TRACT ULTRASOUND COMPLETE  COMPARISON:  None.  FINDINGS: Right Kidney:  Length: 9.5 cm. Normal renal cortical thickness. Mild increased parenchymal echogenicity. No hydronephrosis. There is a sub centimeter hypoechoic lesion within the superior pole of the right kidney, too small accurately characterize.  Left Kidney:  Length: 9.6 cm. Normal renal cortical thickness. Increased renal parenchymal echogenicity. No hydronephrosis. There is a 1.3 cm hypoechoic lesion within the inferior pole of the left kidney. Small amount of fluid about the left kidney.  Bladder:  There is nonmobile echogenic material demonstrated within the bladder. No definite internal vascularity is identified.  IMPRESSION: 1.  Increased renal cortical echogenicity as can be seen with chronic medical renal disease. 2. Echogenic material within the bladder lumen which does not move with repositioning of the patient. No definite internal vascular blood flow is demonstrated. This may represent thick debris versus an underlying mass. Recommend correlation with urinalysis and direct visualization as clinically indicated to exclude underlying mass. 3. Small amount of fluid about the left kidney. 4. Hypoechoic lesion within the left kidney measuring up to 1.3 cm. This does not meet definite criteria for a simple cyst as there is internal echogenicity. No definite vascular flow is identified. Recommend correlation with either short-term (3 months) followup ultrasound or definitive characterization with CT or MRI. These results will be called to the ordering clinician or representative by the Radiologist Assistant, and communication documented in the PACS Dashboard.   Electronically Signed   By: Annia Belt M.D.   On: 07/20/2013 12:23         Subjective: Patient is more alert today. Denies any fevers, chills, chest discomfort, shortness breath, nausea, vomiting, diarrhea, abdominal pain.  Objective: Filed Vitals:   07/25/13 0518 07/25/13 0924 07/25/13 1400 07/25/13 1800  BP: 180/77 174/76 156/72 160/76  Pulse: 65 65 68 71  Temp: 97.6 F (36.4 C) 97.8 F (36.6 C) 97.8 F (36.6 C) 98 F (36.7 C)  TempSrc: Oral Oral Oral Oral  Resp: 18 18 18 18   Height:      Weight:      SpO2: 100% 100% 100% 98%    Intake/Output Summary (Last 24 hours) at 07/25/13 1823 Last data filed at 07/25/13 1700  Gross per 24 hour  Intake    960 ml  Output    350 ml  Net    610 ml   Weight change: 2.375 kg (5 lb 3.8 oz) Exam:   General:  Pt is alert, follows commands appropriately, not in acute distress  HEENT: No icterus, No thrush,[place]/AT  Cardiovascular: RRR, S1/S2, no rubs, no gallops  Respiratory: CTA bilaterally, no wheezing, no  crackles, no rhonchi  Abdomen: Soft/+BS, non tender, non distended, no guarding  Extremities:  Trace LEedema, No lymphangitis, No petechiae, No rashes, no synovitis  Data Reviewed: Basic Metabolic Panel:  Recent Labs Lab 07/19/13 1908 07/20/13 0730 07/21/13 0244 07/23/13 0400 07/25/13 0358  NA 147 146 146 142 144  K 3.8 4.0 4.2 4.2 4.2  CL 108 110 110 109 111  CO2 23 20 22  18* 18*  GLUCOSE 144* 94 100* 176* 95  BUN 68* 71* 67* 66* 59*  CREATININE 6.24* 6.03* 5.96* 5.86* 5.85*  CALCIUM 9.0 8.8 8.6 8.7 8.5  MG  --  2.0  --   --   --   PHOS  --  4.2  --   --   --    Liver Function Tests:  Recent Labs Lab 07/19/13 1908 07/20/13 0730  AST 26 22  ALT 23 20  ALKPHOS 80 71  BILITOT <0.2* <0.2*  PROT 6.3 5.5*  ALBUMIN 2.5* 2.1*   No results found for this basename: LIPASE, AMYLASE,  in the last 168 hours No results found for this basename: AMMONIA,  in the last 168 hours CBC:  Recent Labs Lab 07/19/13 1908 07/20/13 0730 07/21/13 0244 07/23/13 0400 07/25/13 0358  WBC 4.3 4.8 4.4 6.2 4.0  NEUTROABS 2.9  --   --   --   --   HGB 8.0* 7.3* 8.0* 8.0* 7.6*  HCT 23.7* 21.3* 24.2* 24.1* 22.9*  MCV 82.3 82.2 83.4  83.4 83.3  PLT 362 311 351 342 310   Cardiac Enzymes:  Recent Labs Lab 07/20/13 0736 07/20/13 1118 07/20/13 1800  TROPONINI <0.30 <0.30 <0.30   BNP: No components found with this basename: POCBNP,  CBG:  Recent Labs Lab 07/25/13 0002 07/25/13 0416 07/25/13 0902 07/25/13 1130 07/25/13 1632  GLUCAP 130* 89 79 122* 157*    Recent Results (from the past 240 hour(s))  URINE CULTURE     Status: None   Collection Time    07/19/13 11:44 PM      Result Value Ref Range Status   Specimen Description URINE, CATHETERIZED   Final   Special Requests Normal   Final   Culture  Setup Time     Final   Value: 07/20/2013 12:42     Performed at [facility] Count     Final   Value: >=100,000 COLONIES/ML     Performed at [facility]   Culture     Final   Value: KLEBSIELLA PNEUMONIAE     Performed at [facility]   Report Status 07/21/2013 FINAL   Final   Organism ID, Bacteria KLEBSIELLA PNEUMONIAE   Final  MRSA PCR SCREENING     Status: None   Collection Time    07/20/13  5:07 AM      Result Value Ref Range Status   MRSA by PCR NEGATIVE  NEGATIVE Final   Comment:            The GeneXpert MRSA Assay (FDA     approved for NASAL specimens     only), is one component of a     comprehensive MRSA colonization     surveillance program. It is not     intended to diagnose MRSA     infection nor to guide or     monitor treatment for     MRSA infections.  CULTURE, BLOOD (ROUTINE X 2)     Status: None   Collection Time    07/20/13  7:18 AM      Result Value Ref Range Status   Specimen Description BLOOD RIGHT ARM   Final   Special Requests BOTTLES DRAWN AEROBIC AND ANAEROBIC 10CC   Final   Culture  Setup Time     Final   Value: 07/20/2013 16:11     Performed at [facility]   Culture     Final   Value:        BLOOD CULTURE RECEIVED NO GROWTH TO DATE CULTURE WILL BE HELD FOR 5 DAYS BEFORE ISSUING A FINAL NEGATIVE REPORT     Performed at [facility]   Report Status PENDING   Incomplete  CULTURE, BLOOD (ROUTINE X 2)     Status: None   Collection Time    07/20/13  7:30 AM      Result Value Ref Range Status   Specimen Description BLOOD RIGHT HAND   Final   Special Requests BOTTLES DRAWN AEROBIC ONLY 5CC   Final   Culture  Setup Time     Final   Value: 07/20/2013 16:11     Performed at [facility]   Culture     Final   Value:        BLOOD CULTURE RECEIVED NO GROWTH TO DATE CULTURE WILL BE HELD FOR 5 DAYS BEFORE ISSUING A FINAL NEGATIVE REPORT     Performed at [facility]   Report Status PENDING  Incomplete     Scheduled Meds: . amLODipine  10 mg Oral Daily  . atorvastatin  40 mg Oral QPM  . cefTRIAXone (ROCEPHIN)  IV  1 g Intravenous Q24H  . cloNIDine  0.1 mg  Oral TID  . docusate sodium  100 mg Oral BID  . feeding supplement (ENSURE COMPLETE)  237 mL Oral BID BM  . hydrALAZINE  50 mg Oral 3 times per day  . insulin aspart  0-9 Units Subcutaneous 6 times per day  . metoprolol tartrate  25 mg Oral BID  . naphazoline-pheniramine  1 drop Both Eyes BID  . pantoprazole  40 mg Oral Daily  . polyethylene glycol  17 g Oral Daily  . sodium chloride  3 mL Intravenous Q12H   Continuous Infusions: . sodium chloride 100 mL/hr at 07/25/13 1711     Clement Deneault, DO  [facility] Pager [phone]  If 7PM-7AM, please contact night-coverage [email or web address] Password [facility] 07/25/2013, 6:23 PM   LOS: 6 days

## 2013-07-25 NOTE — Progress Notes (Signed)
PT Cancellation Note  Patient Details Name: April [name] MRN: [id number] DOB: 02/07/1922   Cancelled Treatment:    Reason Eval/Treat Not Completed: Other (comment); son in the room and reports pt for d/c home today.  Eating lunch and discussed holding PT due to d/c home.  Patient and son in agreement.   WYNN,CYNDI 07/25/2013, 2:36 PM

## 2013-07-25 NOTE — Discharge Summary (Signed)
Physician Discharge Summary  April [name] ZOX:[id number] DOB: 06-19-1921 DOA: 07/19/2013  PCP: Jeanann Lewandowsky, MD  Admit date: 07/19/2013 Discharge date: 07/26/13 Recommendations for Outpatient Follow-up:  1. Pt will need to follow up with PCP in 2 weeks post discharge 2. Please obtain BMP to evaluate electrolytes and kidney function 3. Please also check CBC to evaluate Hg and Hct levels  Discharge Diagnoses:  Sepsis -  -given hypothermia and UTI (Kleb PNa)  -continue IV ceftriaxone  -Patient will go home with 2 additional dose of ciprofloxacin which will complete 7 days of therapy Diabetes mellitus  - SSI -hemoglobin A1c 6.8  -Patient was not on any diabetic medications prior to admission  -Given the patient's comorbidities and focus of care in light of pt going Home with hospice care, I do not plan to start the patient on any diabetic medications at this time  HTN  - titrate medications for tighter control- will proceed slowly  -Increase hydralazine to 50 mg 3 times a day  -Continue amlodipine, metoprolol tartrate, clonidine  Acute on chronic renal failure -  -Suspect the patient may have a new baseline  -Minimal improvement in serum creatinine despite aggressive fluid resuscitation -Serum creatinine 5.55 on the day of discharge -Baseline appears to be 3  -Continue IV fluids-- no significant improvement with IV fluids -renal U/S shows: Increased renal cortical echogenicity as can be seen with chronic medical renal disease.  Echogenic material within the bladder lumen which does not move with repositioning of the patient. No definite internal vascular  blood flow is demonstrated. This may represent thick debris versus an underlying mass. Recommend correlation with either short-term (3 months) followup ultrasound or definitive characterization with CT or MRI.  -renal consult- not a candidate for dialysis--they recommended continuation of the patient's fluids and liberal control of  her blood pressure -foley placed for strict I/Os  Dementia-  ?baseline  -Acute decompensation likely superimposed upon the patient's acute on chronic renal failure as well as UTI  -Appears close to baseline according to the patient's son  UTI (lower urinary tract infection)  - Kleb PNA - on rocephin  -Home with ciprofloxacin for 2 additional days to complete 7 days of therapy  Anemia  -Likely anemia of CKD  - anemia panel--iron saturation 27%, folate>20, serum B12>2K,  -hemoccult stool   Hypoalbuminemia - Prealbumin ok, nutrition consult--> continue Ensure   GOC  -DNR  -home with hospice  -appreciate palliative team  -After discussion with the patient's son, his intention is to fix any reversible conditions during this hospitalization with the intention of not returning to the hospital for any further events once the patient is discharged home with hospice. -MOST form filled out with palliative medicine team  Discharge Condition:Stable Disposition:  home with hospice   Diet: Regular diet Wt Readings from Last 3 Encounters:  07/25/13 59.7 kg (131 lb 9.8 oz)  07/16/13 56.7 kg (125 lb)  01/11/13 55.566 kg (122 lb 8 oz)    History of present illness:   80 y.o. female  has a past medical history of Diabetes mellitus; Hypertension; Hyperlipidemia; Pneumonia; Subdural hematoma; Arthritis; DVT (deep venous thrombosis); Incontinence; and Renal disorder.  Presented with  Patient was recently evaluated by her PCP and had some labs taken showing worsening renal function with creating going up to 6.2 from baseline of 3 a year ago. No family at bedside patient is unaware why she is here. Denies any pain, denies poor appetite, no nausea or vomiting. In ER patient  was noted to be hypothermic down to 95 degrees rectally. Blood pressure in 200's. Per records she was supposed to start on Diovan but have not filled in prescriptions. The patient was noted to be pleasantly confused. Her mentation  improved with IV hydration, but she remained pleasantly confused throughout the hospitalization. The patient's son confirms that her mentation was near baseline at the time of discharge. The patient was noted to have a UTI and treated appropriately with antibiotics as discussed above. Because of the patient's multiple comorbidities and worsening renal failure, nephrology was consulted. They did not feel that the patient was a good candidate for dialysis. They recommended fluid hydration and liberal control of her blood pressure. Palliative medicine was consulted and goals of care were discussed with the patient's son. Although the patient's was not completely ready for comfort care, he was opened to sending the patient home with hospice care. His intention was to fix any reversible medical conditions with the intention of going home with hospice care and keeping the patient comfortable without intention to come back to the hospital.    Consultants: Nephrology palliative medicine Discharge Exam: Filed Vitals:   07/26/13 0808  BP: 217/75  Pulse: 62  Temp: 97.6 F (36.4 C)  Resp: 19   Filed Vitals:   07/25/13 1800 07/25/13 2022 07/26/13 0406 07/26/13 0808  BP: 160/76 142/58 182/65 217/75  Pulse: 71 56 63 62  Temp: 98 F (36.7 C) 97.9 F (36.6 C) 98.1 F (36.7 C) 97.6 F (36.4 C)  TempSrc: Oral Oral Oral Oral  Resp: 18 16 18 19   Height:      Weight:  59.7 kg (131 lb 9.8 oz)    SpO2: 98% 100% 100% 100%   General: Alert and awake, NAD, pleasant, cooperative Cardiovascular: RRR, no rub, no gallop, no S3 Respiratory: CTAB, no wheeze, no rhonchi Abdomen:soft, nontender, nondistended, positive bowel sounds Extremities: trace LE edema, No lymphangitis, no petechiae  Discharge Instructions      Discharge Orders   Future Appointments Provider Department Dept Phone   09/13/2013 12:00 PM Doris Cheadleeepak Advani, MD [facility] [phone]   Future Orders Complete By  Expires   Increase activity slowly  As directed        Medication List    STOP taking these medications       pravastatin 20 MG tablet  Commonly known as:  PRAVACHOL      TAKE these medications       amLODipine 10 MG tablet  Commonly known as:  NORVASC  Take 1 tablet (10 mg total) by mouth daily.     atorvastatin 40 MG tablet  Commonly known as:  LIPITOR  Take 1 tablet (40 mg total) by mouth every evening.     ciprofloxacin 250 MG tablet  Commonly known as:  CIPRO  Take 1 tablet (250 mg total) by mouth 2 (two) times daily.     cloNIDine 0.2 MG tablet  Commonly known as:  CATAPRES  Take 0.5 tablets (0.1 mg total) by mouth 3 (three) times daily.     EYE DROPS OP  Place 1 drop into both eyes daily as needed (dry eyes).     fish oil-omega-3 fatty acids 1000 MG capsule  Take 2 capsules (2 g total) by mouth daily.     hydrALAZINE 50 MG tablet  Commonly known as:  APRESOLINE  Take 1 tablet (50 mg total) by mouth every 8 (eight) hours.     metoprolol tartrate 25  MG tablet  Commonly known as:  LOPRESSOR  Take 1 tablet (25 mg total) by mouth 2 (two) times daily.     multivitamin with minerals Tabs tablet  Take 1 tablet by mouth daily.     polyethylene glycol powder powder  Commonly known as:  MIRALAX  Take 17 g by mouth daily.         The results of significant diagnostics from this hospitalization (including imaging, microbiology, ancillary and laboratory) are listed below for reference.    Significant Diagnostic Studies: Ct Head Wo Contrast  07/20/2013   CLINICAL DATA:  Evaluate for intracranial hemorrhage  EXAM: CT HEAD WITHOUT CONTRAST  TECHNIQUE: Contiguous axial images were obtained from the base of the skull through the vertex without intravenous contrast.  COMPARISON:  Prior CT from 02/12/2013  FINDINGS: Sequelae of prior right frontoparietal craniotomy with cranioplasty again seen. Linear hyperdensity subjacent to the craniotomy defect likely reflects  sequelae of cranioplasty/dural repair. Previously seen fluid within the subdural space has resolved.  Extensive atrophy with chronic microvascular ischemic changes again seen, unchanged. No mass lesion, midline shift, or hydrocephalus. No extra-axial fluid collection. No acute intracranial hemorrhage or large vessel territory infarct. More focal encephalomalacia within the right frontal lobe likely represents a remote infarct.  No skull fracture. Orbits are within normal limits. Scattered opacity noted within the right ethmoidal air cells. No mastoid effusion.  IMPRESSION: 1. No acute intracranial process. 2. Postoperative changes from right frontoparietal craniotomy with cranioplasty. Previously seen subdural fluid subjacent to the craniotomy site has resolved. 3. Advanced age-related atrophy with chronic microvascular ischemic disease, unchanged.   Electronically Signed   By: Rise Mu M.D.   On: 07/20/2013 04:17   US Renal  07/20/2013   CLINICAL DATA:  Renal failure  EXAM: RENAL/URINARY TRACT ULTRASOUND COMPLETE  COMPARISON:  None.  FINDINGS: Right Kidney:  Length: 9.5 cm. Normal renal cortical thickness. Mild increased parenchymal echogenicity. No hydronephrosis. There is a sub centimeter hypoechoic lesion within the superior pole of the right kidney, too small accurately characterize.  Left Kidney:  Length: 9.6 cm. Normal renal cortical thickness. Increased renal parenchymal echogenicity. No hydronephrosis. There is a 1.3 cm hypoechoic lesion within the inferior pole of the left kidney. Small amount of fluid about the left kidney.  Bladder:  There is nonmobile echogenic material demonstrated within the bladder. No definite internal vascularity is identified.  IMPRESSION: 1. Increased renal cortical echogenicity as can be seen with chronic medical renal disease. 2. Echogenic material within the bladder lumen which does not move with repositioning of the patient. No definite internal vascular blood  flow is demonstrated. This may represent thick debris versus an underlying mass. Recommend correlation with urinalysis and direct visualization as clinically indicated to exclude underlying mass. 3. Small amount of fluid about the left kidney. 4. Hypoechoic lesion within the left kidney measuring up to 1.3 cm. This does not meet definite criteria for a simple cyst as there is internal echogenicity. No definite vascular flow is identified. Recommend correlation with either short-term (3 months) followup ultrasound or definitive characterization with CT or MRI. These results will be called to the ordering clinician or representative by the Radiologist Assistant, and communication documented in the PACS Dashboard.   Electronically Signed   By: Annia Belt M.D.   On: 07/20/2013 12:23     Microbiology: Recent Results (from the past 240 hour(s))  URINE CULTURE     Status: None   Collection Time    07/19/13 11:44 PM  Result Value Ref Range Status   Specimen Description URINE, CATHETERIZED   Final   Special Requests Normal   Final   Culture  Setup Time     Final   Value: 07/20/2013 12:42     Performed at [facility] Count     Final   Value: >=100,000 COLONIES/ML     Performed at [facility]   Culture     Final   Value: KLEBSIELLA PNEUMONIAE     Performed at [facility]   Report Status 07/21/2013 FINAL   Final   Organism ID, Bacteria KLEBSIELLA PNEUMONIAE   Final  MRSA PCR SCREENING     Status: None   Collection Time    07/20/13  5:07 AM      Result Value Ref Range Status   MRSA by PCR NEGATIVE  NEGATIVE Final   Comment:            The GeneXpert MRSA Assay (FDA     approved for NASAL specimens     only), is one component of a     comprehensive MRSA colonization     surveillance program. It is not     intended to diagnose MRSA     infection nor to guide or     monitor treatment for     MRSA infections.  CULTURE, BLOOD (ROUTINE X 2)     Status: None    Collection Time    07/20/13  7:18 AM      Result Value Ref Range Status   Specimen Description BLOOD RIGHT ARM   Final   Special Requests BOTTLES DRAWN AEROBIC AND ANAEROBIC 10CC   Final   Culture  Setup Time     Final   Value: 07/20/2013 16:11     Performed at [facility]   Culture     Final   Value: NO GROWTH 5 DAYS     Performed at [facility]   Report Status 07/26/2013 FINAL   Final  CULTURE, BLOOD (ROUTINE X 2)     Status: None   Collection Time    07/20/13  7:30 AM      Result Value Ref Range Status   Specimen Description BLOOD RIGHT HAND   Final   Special Requests BOTTLES DRAWN AEROBIC ONLY 5CC   Final   Culture  Setup Time     Final   Value: 07/20/2013 16:11     Performed at [facility]   Culture     Final   Value: NO GROWTH 5 DAYS     Performed at [facility]   Report Status 07/26/2013 FINAL   Final     Labs: Basic Metabolic Panel:  Recent Labs Lab 07/20/13 0730 07/21/13 0244 07/23/13 0400 07/25/13 0358 07/26/13 0439  NA 146 146 142 144 142  K 4.0 4.2 4.2 4.2 4.5  CL 110 110 109 111 111  CO2 20 22 18* 18* 15*  GLUCOSE 94 100* 176* 95 169*  BUN 71* 67* 66* 59* 59*  CREATININE 6.03* 5.96* 5.86* 5.85* 5.55*  CALCIUM 8.8 8.6 8.7 8.5 8.3*  MG 2.0  --   --   --   --   PHOS 4.2  --   --   --   --    Liver Function Tests:  Recent Labs Lab 07/19/13 1908 07/20/13 0730  AST 26 22  ALT 23 20  ALKPHOS 80 71  BILITOT <0.2* <0.2*  PROT 6.3 5.5*  ALBUMIN 2.5* 2.1*   No results found for this basename: LIPASE, AMYLASE,  in the last 168 hours No results found for this basename: AMMONIA,  in the last 168 hours CBC:  Recent Labs Lab 07/19/13 1908 07/20/13 0730 07/21/13 0244 07/23/13 0400 07/25/13 0358 07/26/13 0439  WBC 4.3 4.8 4.4 6.2 4.0 3.8*  NEUTROABS 2.9  --   --   --   --   --   HGB 8.0* 7.3* 8.0* 8.0* 7.6* 7.7*  HCT 23.7* 21.3* 24.2* 24.1* 22.9* 23.5*  MCV 82.3 82.2 83.4 83.4 83.3 83.9  PLT 362 311 351  342 310 251   Cardiac Enzymes:  Recent Labs Lab 07/20/13 0736 07/20/13 1118 07/20/13 1800  TROPONINI <0.30 <0.30 <0.30   BNP: No components found with this basename: POCBNP,  CBG:  Recent Labs Lab 07/25/13 1632 07/25/13 2015 07/26/13 0007 07/26/13 0409 07/26/13 0802  GLUCAP 157* 193* 195* 161* 149*    Time coordinating discharge:  Greater than 30 minutes  Signed:  Julia Alkhatib, DO [facility] Pager: [phone] 07/26/2013, 9:26 AM

## 2013-07-25 NOTE — Consult Note (Signed)
I have reviewed this case with our NP and agree with the Assessment and Plan as stated.  Caylee Vlachos L. Korrina Zern, MD MBA The Palliative Medicine Team at [facility] Team Phone: [phone] Pager: [phone]   

## 2013-07-25 NOTE — Progress Notes (Signed)
NUTRITION FOLLOW-UP  DOCUMENTATION CODES Per approved criteria  -Not Applicable   INTERVENTION: Continue Ensure Complete po BID, each supplement provides 350 kcal and 13 grams of protein. RD to continue to follow nutrition care plan.  NUTRITION DIAGNOSIS: Inadequate oral intake now -resolved.  Goal: Intake to meet >90% of estimated nutrition needs.  Monitor:  weight trends, lab trends, I/O's, po intake   ASSESSMENT: PMHx significant for DM, HTN, HLD, PNA, SDH, DVT, renal disorder. Was recently evaluated by her PCP, labs showing worsening renal function, Cre increased to 6.2 from baseline of 3 x 1year. In ER patient was noted to be hypothermic at 95 degrees rectally. Blood pressure in 200's. Per records she was supposed to start on Diovan but have not filled in prescriptions. Work-up reveals sepsis 2/2 UTI and hypothermia.  Renal evaluated pt - noted have stage IV CKD, not HD candidate 2/2 comorbidities.   Palliative care team evaluated pt on 2/18. Plan is for patient to d/c home with hospice. RD discussed nutrition with son, [name], at bedside, who reports that he prepares all meals for her. RD provided emotional support, and praised pt for adequate assistance with her feeding and nutrition at home. Recommended son continue excellent care for her at home.  Pt is eating well, son at bedside, fed pt 95% of lunch.  Height: Ht Readings from Last 1 Encounters:  07/22/13 5\' 4"  (1.626 m)    Weight: Wt Readings from Last 1 Encounters:  07/24/13 122 lb 2.2 oz (55.4 kg)  Admit wt 107 lb  BMI:  21.4 - WNL  Estimated Nutritional Needs: Kcal: 1300 - 1500 Protein: 55 - 70 g Fluid: at least 1.5 liters daily  Skin: intact  Diet Order: Dysphagia 2  EDUCATION NEEDS: -No education needs identified at this time   Intake/Output Summary (Last 24 hours) at 07/25/13 1424 Last data filed at 07/25/13 0900  Gross per 24 hour  Intake   1200 ml  Output    350 ml  Net    850 ml    Last  BM: PTA  Labs:   Recent Labs Lab 07/20/13 0730 07/21/13 0244 07/23/13 0400 07/25/13 0358  NA 146 146 142 144  K 4.0 4.2 4.2 4.2  CL 110 110 109 111  CO2 20 22 18* 18*  BUN 71* 67* 66* 59*  CREATININE 6.03* 5.96* 5.86* 5.85*  CALCIUM 8.8 8.6 8.7 8.5  MG 2.0  --   --   --   PHOS 4.2  --   --   --   GLUCOSE 94 100* 176* 95    CBG (last 3)   Recent Labs  07/25/13 0416 07/25/13 0902 07/25/13 1130  GLUCAP 89 79 122*    Scheduled Meds: . amLODipine  10 mg Oral Daily  . atorvastatin  40 mg Oral QPM  . cefTRIAXone (ROCEPHIN)  IV  1 g Intravenous Q24H  . cloNIDine  0.1 mg Oral TID  . docusate sodium  100 mg Oral BID  . feeding supplement (ENSURE COMPLETE)  237 mL Oral BID BM  . hydrALAZINE  50 mg Oral 3 times per day  . insulin aspart  0-9 Units Subcutaneous 6 times per day  . metoprolol tartrate  25 mg Oral BID  . naphazoline-pheniramine  1 drop Both Eyes BID  . pantoprazole  40 mg Oral Daily  . polyethylene glycol  17 g Oral Daily  . sodium chloride  3 mL Intravenous Q12H    Continuous Infusions: . sodium chloride Stopped (  07/25/13 0617)    Jarold MottoSamantha Shakeel Disney MS, RD, LDN Inpatient Registered Dietitian Pager: [phone] After-hours pager: [phone]

## 2013-07-26 LAB — CULTURE, BLOOD (ROUTINE X 2)
CULTURE: NO GROWTH
Culture: NO GROWTH

## 2013-07-26 LAB — CBC
HCT: 23.5 % — ABNORMAL LOW (ref 36.0–46.0)
Hemoglobin: 7.7 g/dL — ABNORMAL LOW (ref 12.0–15.0)
MCH: 27.5 pg (ref 26.0–34.0)
MCHC: 32.8 g/dL (ref 30.0–36.0)
MCV: 83.9 fL (ref 78.0–100.0)
PLATELETS: 251 10*3/uL (ref 150–400)
RBC: 2.8 MIL/uL — ABNORMAL LOW (ref 3.87–5.11)
RDW: 16.4 % — AB (ref 11.5–15.5)
WBC: 3.8 10*3/uL — AB (ref 4.0–10.5)

## 2013-07-26 LAB — BASIC METABOLIC PANEL
BUN: 59 mg/dL — ABNORMAL HIGH (ref 6–23)
CALCIUM: 8.3 mg/dL — AB (ref 8.4–10.5)
CO2: 15 mEq/L — ABNORMAL LOW (ref 19–32)
Chloride: 111 mEq/L (ref 96–112)
Creatinine, Ser: 5.55 mg/dL — ABNORMAL HIGH (ref 0.50–1.10)
GFR calc non Af Amer: 6 mL/min — ABNORMAL LOW (ref >90)
GFR, EST AFRICAN AMERICAN: 7 mL/min — AB (ref >90)
Glucose, Bld: 169 mg/dL — ABNORMAL HIGH (ref 70–99)
Potassium: 4.5 mEq/L (ref 3.7–5.3)
SODIUM: 142 meq/L (ref 137–147)

## 2013-07-26 LAB — GLUCOSE, CAPILLARY
Glucose-Capillary: 149 mg/dL — ABNORMAL HIGH (ref 70–99)
Glucose-Capillary: 161 mg/dL — ABNORMAL HIGH (ref 70–99)
Glucose-Capillary: 166 mg/dL — ABNORMAL HIGH (ref 70–99)
Glucose-Capillary: 195 mg/dL — ABNORMAL HIGH (ref 70–99)

## 2013-07-26 MED ORDER — CIPROFLOXACIN HCL 250 MG PO TABS: 250.0000 mg | ORAL_TABLET | Freq: Two times a day (BID) | ORAL | Status: AC

## 2013-07-26 MED ORDER — HYDRALAZINE HCL 50 MG PO TABS: 50.0000 mg | ORAL_TABLET | Freq: Three times a day (TID) | ORAL | Status: AC

## 2013-07-26 NOTE — Progress Notes (Signed)
Pt discharged to home with hospice after visit summary reviewed with pt's son and he was capable of re verbalizing medications and follow up appointments. Pt remains stable. No signs and symptoms of distress. Educated to return to ER in the event of SOB, dizziness, chest pain, or fainting. Laverda SorensonATJANA Donelle Baba, RN

## 2013-07-26 NOTE — Progress Notes (Signed)
Spoke with son  April [name]  [place]  # [phone]  Home  / # [phone]  [phone] cell  Concerned   About living arrangements.  Instructed  To  Call  In am  To speak with case manager/social woker  With concerns

## 2013-07-26 NOTE — Progress Notes (Signed)
   CARE MANAGEMENT NOTE 07/26/2013  Patient:  April [name], April [name]   Account Number:  [id number]  Date Initiated:  07/23/2013  Documentation initiated by:  Lizabeth Leyden  Subjective/Objective Assessment:   admitted with ARF, CR 6.2, hypothermic temp 95  lives at home with son     Action/Plan:   progression of care and discharge planning   Anticipated DC Date:  07/26/2013   Anticipated DC Plan:  [place]  CM consult      PAC Choice  HOSPICE   Choice offered to / List presented to:  C-4 Adult Children           [place]   Status of service:  Completed, signed off Medicare Important Message given?   (If response is "NO", the following Medicare IM given date fields will be blank) Date Medicare IM given:   Date Additional Medicare IM given:    Discharge Disposition:  [place]  Per UR Regulation:    If discussed at Long Length of Stay Meetings, dates discussed:   07/25/2013    Comments:  07/26/2013  [place], [place] spoke with Raynelle Dick, advised patient has discharge orders for today. FAX copy of dc summary and palliative notes  07/24/2013  [place], [place] [phone] CM referral: Home Hospice  Met with son to discuss home hospice and offer choice. He selected [facility].  [facility]  [phone] spoke with Caryl Pina, gave referral and contact information for son plan discharge Thursday,will need hospital bed, overbed table FAX:  [phone]  face sheet, H&P, progress notes, will send DC summary when available  07/23/2013  [place], [place] plan palliative care meeting.

## 2013-07-26 NOTE — Progress Notes (Signed)
Progress Note from the Palliative Medicine Team at [facility]  Subjective: [name] is going home with hospice care for her renal failure. I spoke with her son, April [name], as they are about to discharge and hospice has delivered all equipment to the home and he is getting her dressed in the room. We completed MOST form: DNR, comfort measures without return to hospital (may need to utilize hospice facility in future), consider use of antibiotics and IVF, and no feeding tube.    Objective: No Known Allergies Scheduled Meds: . amLODipine  10 mg Oral Daily  . atorvastatin  40 mg Oral QPM  . cefTRIAXone (ROCEPHIN)  IV  1 g Intravenous Q24H  . cloNIDine  0.1 mg Oral TID  . docusate sodium  100 mg Oral BID  . feeding supplement (ENSURE COMPLETE)  237 mL Oral BID BM  . hydrALAZINE  50 mg Oral 3 times per day  . insulin aspart  0-9 Units Subcutaneous 6 times per day  . metoprolol tartrate  25 mg Oral BID  . naphazoline-pheniramine  1 drop Both Eyes BID  . pantoprazole  40 mg Oral Daily  . polyethylene glycol  17 g Oral Daily  . sodium chloride  3 mL Intravenous Q12H   Continuous Infusions: . sodium chloride 100 mL/hr at 07/25/13 1711   PRN Meds:.acetaminophen, acetaminophen, hydrALAZINE, HYDROcodone-acetaminophen, ondansetron (ZOFRAN) IV, ondansetron  BP 183/88  Pulse 63  Temp(Src) 98.1 F (36.7 C) (Oral)  Resp 19  Ht 5\' 4"  (1.626 m)  Wt 59.7 kg (131 lb 9.8 oz)  BMI 22.58 kg/m2  SpO2 100%   PPS: 40%     Intake/Output Summary (Last 24 hours) at 07/26/13 1623 Last data filed at 07/26/13 1406  Gross per 24 hour  Intake    660 ml  Output    320 ml  Net    340 ml      LBM: 07/24/13      Physical Exam:  General: NAD, pleasant, alert HEENT:  April [name], moist mucous membranes, no JVD Chest:  CTA throughout, symmetric, no labored breathes CVS: RRR, S1 S2 Abdomen: Soft, NT, less distended, +BS Ext: MAE, trace bilat ankle edema, warm to touch Neuro: Alert, confused at times, follows  commands  Labs: CBC    Component Value Date/Time   WBC 3.8* 07/26/2013 0439   RBC 2.80* 07/26/2013 0439   RBC 2.59* 07/20/2013 0730   HGB 7.7* 07/26/2013 0439   HCT 23.5* 07/26/2013 0439   PLT 251 07/26/2013 0439   MCV 83.9 07/26/2013 0439   MCH 27.5 07/26/2013 0439   MCHC 32.8 07/26/2013 0439   RDW 16.4* 07/26/2013 0439   LYMPHSABS 1.1 07/19/2013 1908   MONOABS 0.3 07/19/2013 1908   EOSABS 0.1 07/19/2013 1908   BASOSABS 0.0 07/19/2013 1908    BMET    Component Value Date/Time   NA 142 07/26/2013 0439   K 4.5 07/26/2013 0439   CL 111 07/26/2013 0439   CO2 15* 07/26/2013 0439   GLUCOSE 169* 07/26/2013 0439   BUN 59* 07/26/2013 0439   CREATININE 5.55* 07/26/2013 0439   CREATININE 6.10* 07/16/2013 1119   CALCIUM 8.3* 07/26/2013 0439   GFRNONAA 6* 07/26/2013 0439   GFRAA 7* 07/26/2013 0439    CMP     Component Value Date/Time   NA 142 07/26/2013 0439   K 4.5 07/26/2013 0439   CL 111 07/26/2013 0439   CO2 15* 07/26/2013 0439   GLUCOSE 169* 07/26/2013 0439   BUN 59* 07/26/2013 0439  CREATININE 5.55* 07/26/2013 0439   CREATININE 6.10* 07/16/2013 1119   CALCIUM 8.3* 07/26/2013 0439   PROT 5.5* 07/20/2013 0730   ALBUMIN 2.1* 07/20/2013 0730   AST 22 07/20/2013 0730   ALT 20 07/20/2013 0730   ALKPHOS 71 07/20/2013 0730   BILITOT <0.2* 07/20/2013 0730   GFRNONAA 6* 07/26/2013 0439   GFRAA 7* 07/26/2013 0439    Assessment and Plan: 1. Code Status: DNR 2. Symptom Control:  1. Pain: Vicodin prn.  2. Bowel Regimen: Colace and Miralax scheduled.  3. Fever: Acetaminophen prn.  4. Nausea/Vomiting: Ondansetron prn. 3. Psycho/Social: Emotional support provided to patient. 4. Disposition: Home with hospice.  Patient Documents Completed or Given: Document Given Completed  Advanced Directives Pkt    MOST  yes  DNR  yes  Gone from [facility]    Hard Choices      Time In Time Out Total Time Spent with Patient Total Overall Time  0345 0415     Greater than 50%  of this time was spent  counseling and coordinating care related to the above assessment and plan.   Yong Channel, NP Palliative Medicine Team Pager # [phone] Team Phone # [phone]

## 2013-07-26 NOTE — Progress Notes (Signed)
Physical Therapy Treatment Patient Details Name: April [name] MRN: [id number] DOB: 01/13/1922 Today's Date: 07/26/2013 Time: [id number] PT Time Calculation (min): 10 min  PT Assessment / Plan / Recommendation  History of Present Illness Pt admit with sepsis and DM.     PT Comments   Patient c/o back itching so was able to sit at edge of bed to get lotion on her back and cleaned from incontinent episode in bed.  Pivot to chair with assist and pt more comfortable.  Noted plans for home with Hospice, may not need HHPT.  Follow Up Recommendations  Home health PT;Supervision/Assistance - 24 hour (versus no HHPT if not allowed per Hospice plans)           Equipment Recommendations  Other (comment)       Frequency Min 3X/week   Progress towards PT Goals Progress towards PT goals: Progressing toward goals  Plan Current plan remains appropriate    Precautions / Restrictions Precautions Precautions: Fall   Pertinent Vitals/Pain No pain complaints    Mobility  Bed Mobility Overal bed mobility: Needs Assistance Bed Mobility: Rolling;Sidelying to Sit Rolling: Mod assist Sidelying to sit: Max assist General bed mobility comments: use of rail for side to sit; assisted with pad to roll patient Transfers Overall transfer level: Needs assistance Equipment used: None Transfers: Stand Pivot Transfers Stand pivot transfers: Mod assist General transfer comment: bed to recliner with mod assist for balance and lifting pt reaching for chair     PT Goals (current goals can now be found in the care plan section)    Visit Information  Last PT Received On: 07/26/13 Assistance Needed: +2 History of Present Illness: Pt admit with sepsis and DM.      Subjective Data   My back itches, they need to take care of me.   Cognition  Cognition Arousal/Alertness: Awake/alert Behavior During Therapy: WFL for tasks assessed/performed Overall Cognitive Status: History of cognitive impairments - at  baseline    Balance  Balance Overall balance assessment: Needs assistance Sitting balance-Leahy Scale: Fair Sitting balance - Comments: sat edge of bed supervision x 4 minutes Standing balance-Leahy Scale: Poor  End of Session PT - End of Session Equipment Utilized During Treatment: Gait belt Activity Tolerance: Patient tolerated treatment well Patient left: with call bell/phone within reach;in chair   GP     [facility] LtdWYNN,CYNDI 07/26/2013, 1:37 PM Sheran Lawlessyndi Keigan Girten, PT [phone] 07/26/2013

## 2013-09-04 DEATH — deceased

## 2013-09-13 ENCOUNTER — Ambulatory Visit: Payer: Medicare Other | Admitting: Internal Medicine

## 2013-10-14 ENCOUNTER — Ambulatory Visit: Payer: Medicare Other | Admitting: Internal Medicine
